# Patient Record
Sex: Male | Born: 1935 | Race: White | Hispanic: No | Marital: Married | Smoking: Never smoker
Health system: Southern US, Community
[De-identification: ages and names within clinical notes are randomized; demographics above are authoritative.]

## PROBLEM LIST (undated history)

## (undated) DIAGNOSIS — M1990 Unspecified osteoarthritis, unspecified site: Secondary | ICD-10-CM

## (undated) DIAGNOSIS — Z9289 Personal history of other medical treatment: Secondary | ICD-10-CM

## (undated) DIAGNOSIS — J449 Chronic obstructive pulmonary disease, unspecified: Secondary | ICD-10-CM

## (undated) DIAGNOSIS — I639 Cerebral infarction, unspecified: Secondary | ICD-10-CM

## (undated) DIAGNOSIS — I779 Disorder of arteries and arterioles, unspecified: Secondary | ICD-10-CM

## (undated) DIAGNOSIS — N200 Calculus of kidney: Secondary | ICD-10-CM

## (undated) DIAGNOSIS — K5790 Diverticulosis of intestine, part unspecified, without perforation or abscess without bleeding: Secondary | ICD-10-CM

## (undated) DIAGNOSIS — E039 Hypothyroidism, unspecified: Secondary | ICD-10-CM

## (undated) DIAGNOSIS — G459 Transient cerebral ischemic attack, unspecified: Secondary | ICD-10-CM

## (undated) DIAGNOSIS — I739 Peripheral vascular disease, unspecified: Secondary | ICD-10-CM

## (undated) DIAGNOSIS — K219 Gastro-esophageal reflux disease without esophagitis: Secondary | ICD-10-CM

## (undated) DIAGNOSIS — N183 Chronic kidney disease, stage 3 unspecified: Secondary | ICD-10-CM

## (undated) DIAGNOSIS — K224 Dyskinesia of esophagus: Secondary | ICD-10-CM

## (undated) DIAGNOSIS — R51 Headache: Secondary | ICD-10-CM

## (undated) DIAGNOSIS — N400 Enlarged prostate without lower urinary tract symptoms: Secondary | ICD-10-CM

## (undated) DIAGNOSIS — Q394 Esophageal web: Secondary | ICD-10-CM

## (undated) DIAGNOSIS — D649 Anemia, unspecified: Secondary | ICD-10-CM

## (undated) DIAGNOSIS — J189 Pneumonia, unspecified organism: Secondary | ICD-10-CM

## (undated) DIAGNOSIS — I428 Other cardiomyopathies: Secondary | ICD-10-CM

## (undated) DIAGNOSIS — I480 Paroxysmal atrial fibrillation: Secondary | ICD-10-CM

## (undated) DIAGNOSIS — I10 Essential (primary) hypertension: Secondary | ICD-10-CM

## (undated) DIAGNOSIS — I82409 Acute embolism and thrombosis of unspecified deep veins of unspecified lower extremity: Secondary | ICD-10-CM

## (undated) DIAGNOSIS — E785 Hyperlipidemia, unspecified: Secondary | ICD-10-CM

## (undated) HISTORY — DX: Disorder of arteries and arterioles, unspecified: I77.9

## (undated) HISTORY — DX: Peripheral vascular disease, unspecified: I73.9

## (undated) HISTORY — DX: Esophageal web: Q39.4

## (undated) HISTORY — DX: Acute embolism and thrombosis of unspecified deep veins of unspecified lower extremity: I82.409

## (undated) HISTORY — DX: Chronic kidney disease, stage 3 unspecified: N18.30

## (undated) HISTORY — PX: OTHER SURGICAL HISTORY: SHX169

## (undated) HISTORY — DX: Hyperlipidemia, unspecified: E78.5

## (undated) HISTORY — DX: Paroxysmal atrial fibrillation: I48.0

## (undated) HISTORY — DX: Chronic obstructive pulmonary disease, unspecified: J44.9

## (undated) HISTORY — PX: JOINT REPLACEMENT: SHX530

## (undated) HISTORY — PX: TOTAL HIP ARTHROPLASTY: SHX124

## (undated) HISTORY — DX: Cerebral infarction, unspecified: I63.9

## (undated) HISTORY — DX: Chronic kidney disease, stage 3 (moderate): N18.3

## (undated) HISTORY — DX: Other cardiomyopathies: I42.8

## (undated) HISTORY — DX: Dyskinesia of esophagus: K22.4

---

## 1994-05-30 HISTORY — PX: TRANSURETHRAL RESECTION OF PROSTATE: SHX73

## 1999-12-15 ENCOUNTER — Ambulatory Visit (HOSPITAL_COMMUNITY): Admission: RE | Admit: 1999-12-15 | Discharge: 1999-12-15 | Payer: Self-pay | Admitting: Internal Medicine

## 1999-12-15 ENCOUNTER — Encounter (INDEPENDENT_AMBULATORY_CARE_PROVIDER_SITE_OTHER): Payer: Self-pay | Admitting: Internal Medicine

## 2000-09-26 ENCOUNTER — Ambulatory Visit (HOSPITAL_COMMUNITY): Admission: RE | Admit: 2000-09-26 | Discharge: 2000-09-26 | Payer: Self-pay | Admitting: Family Medicine

## 2000-09-26 ENCOUNTER — Encounter: Payer: Self-pay | Admitting: Family Medicine

## 2000-11-03 ENCOUNTER — Inpatient Hospital Stay (HOSPITAL_COMMUNITY): Admission: AD | Admit: 2000-11-03 | Discharge: 2000-11-04 | Payer: Self-pay | Admitting: Urology

## 2000-11-03 ENCOUNTER — Encounter: Payer: Self-pay | Admitting: Internal Medicine

## 2000-11-03 ENCOUNTER — Ambulatory Visit (HOSPITAL_COMMUNITY): Admission: RE | Admit: 2000-11-03 | Discharge: 2000-11-03 | Payer: Self-pay | Admitting: Internal Medicine

## 2000-11-04 ENCOUNTER — Encounter: Payer: Self-pay | Admitting: Urology

## 2000-11-08 ENCOUNTER — Encounter: Payer: Self-pay | Admitting: Urology

## 2000-11-08 ENCOUNTER — Ambulatory Visit (HOSPITAL_COMMUNITY): Admission: RE | Admit: 2000-11-08 | Discharge: 2000-11-08 | Payer: Self-pay | Admitting: Urology

## 2000-11-10 ENCOUNTER — Encounter: Payer: Self-pay | Admitting: Urology

## 2000-11-10 ENCOUNTER — Ambulatory Visit (HOSPITAL_COMMUNITY): Admission: RE | Admit: 2000-11-10 | Discharge: 2000-11-10 | Payer: Self-pay | Admitting: Urology

## 2000-12-12 ENCOUNTER — Ambulatory Visit (HOSPITAL_COMMUNITY): Admission: RE | Admit: 2000-12-12 | Discharge: 2000-12-12 | Payer: Self-pay | Admitting: Urology

## 2000-12-12 ENCOUNTER — Encounter: Payer: Self-pay | Admitting: Urology

## 2001-02-09 ENCOUNTER — Ambulatory Visit (HOSPITAL_COMMUNITY): Admission: RE | Admit: 2001-02-09 | Discharge: 2001-02-09 | Payer: Self-pay | Admitting: Urology

## 2001-02-09 ENCOUNTER — Encounter: Payer: Self-pay | Admitting: Urology

## 2001-04-09 ENCOUNTER — Encounter: Payer: Self-pay | Admitting: Emergency Medicine

## 2001-04-09 ENCOUNTER — Emergency Department (HOSPITAL_COMMUNITY): Admission: EM | Admit: 2001-04-09 | Discharge: 2001-04-09 | Payer: Self-pay | Admitting: Emergency Medicine

## 2001-04-12 ENCOUNTER — Encounter: Payer: Self-pay | Admitting: Family Medicine

## 2001-04-12 ENCOUNTER — Ambulatory Visit (HOSPITAL_COMMUNITY): Admission: RE | Admit: 2001-04-12 | Discharge: 2001-04-12 | Payer: Self-pay | Admitting: Family Medicine

## 2001-04-20 ENCOUNTER — Ambulatory Visit (HOSPITAL_COMMUNITY): Admission: RE | Admit: 2001-04-20 | Discharge: 2001-04-20 | Payer: Self-pay | Admitting: Family Medicine

## 2001-04-20 ENCOUNTER — Encounter: Payer: Self-pay | Admitting: Family Medicine

## 2001-05-11 ENCOUNTER — Encounter: Payer: Self-pay | Admitting: Urology

## 2001-05-11 ENCOUNTER — Ambulatory Visit (HOSPITAL_COMMUNITY): Admission: RE | Admit: 2001-05-11 | Discharge: 2001-05-11 | Payer: Self-pay | Admitting: Urology

## 2001-07-26 ENCOUNTER — Encounter: Payer: Self-pay | Admitting: Emergency Medicine

## 2001-07-26 ENCOUNTER — Emergency Department (HOSPITAL_COMMUNITY): Admission: EM | Admit: 2001-07-26 | Discharge: 2001-07-26 | Payer: Self-pay | Admitting: Emergency Medicine

## 2001-08-14 ENCOUNTER — Ambulatory Visit (HOSPITAL_COMMUNITY): Admission: RE | Admit: 2001-08-14 | Discharge: 2001-08-14 | Payer: Self-pay | Admitting: Urology

## 2001-08-14 ENCOUNTER — Encounter: Payer: Self-pay | Admitting: Urology

## 2001-12-27 ENCOUNTER — Ambulatory Visit (HOSPITAL_COMMUNITY): Admission: RE | Admit: 2001-12-27 | Discharge: 2001-12-27 | Payer: Self-pay | Admitting: Orthopaedic Surgery

## 2002-11-01 ENCOUNTER — Ambulatory Visit (HOSPITAL_COMMUNITY): Admission: RE | Admit: 2002-11-01 | Discharge: 2002-11-01 | Payer: Self-pay | Admitting: Family Medicine

## 2002-11-01 ENCOUNTER — Encounter: Payer: Self-pay | Admitting: Family Medicine

## 2002-11-07 ENCOUNTER — Encounter (HOSPITAL_COMMUNITY): Admission: RE | Admit: 2002-11-07 | Discharge: 2002-12-07 | Payer: Self-pay | Admitting: Family Medicine

## 2002-11-07 ENCOUNTER — Encounter: Payer: Self-pay | Admitting: Family Medicine

## 2003-03-07 ENCOUNTER — Ambulatory Visit (HOSPITAL_COMMUNITY): Admission: RE | Admit: 2003-03-07 | Discharge: 2003-03-07 | Payer: Self-pay | Admitting: Family Medicine

## 2003-03-07 ENCOUNTER — Encounter: Payer: Self-pay | Admitting: Family Medicine

## 2003-04-11 ENCOUNTER — Ambulatory Visit (HOSPITAL_COMMUNITY): Admission: RE | Admit: 2003-04-11 | Discharge: 2003-04-11 | Payer: Self-pay | Admitting: Orthopedic Surgery

## 2003-05-06 ENCOUNTER — Encounter (HOSPITAL_COMMUNITY): Admission: RE | Admit: 2003-05-06 | Discharge: 2003-05-30 | Payer: Self-pay | Admitting: Orthopedic Surgery

## 2003-06-03 ENCOUNTER — Encounter (HOSPITAL_COMMUNITY): Admission: RE | Admit: 2003-06-03 | Discharge: 2003-07-03 | Payer: Self-pay | Admitting: Orthopedic Surgery

## 2004-01-01 ENCOUNTER — Encounter: Admission: RE | Admit: 2004-01-01 | Discharge: 2004-01-01 | Payer: Self-pay | Admitting: Orthopedic Surgery

## 2004-03-25 ENCOUNTER — Ambulatory Visit (HOSPITAL_COMMUNITY): Admission: RE | Admit: 2004-03-25 | Discharge: 2004-03-25 | Payer: Self-pay | Admitting: Family Medicine

## 2004-04-15 ENCOUNTER — Ambulatory Visit: Payer: Self-pay | Admitting: Orthopedic Surgery

## 2004-05-30 HISTORY — PX: REPLACEMENT TOTAL KNEE: SUR1224

## 2004-07-08 ENCOUNTER — Ambulatory Visit: Admission: RE | Admit: 2004-07-08 | Discharge: 2004-07-08 | Payer: Self-pay | Admitting: Orthopedic Surgery

## 2004-07-09 ENCOUNTER — Ambulatory Visit: Payer: Self-pay | Admitting: *Deleted

## 2004-07-12 ENCOUNTER — Ambulatory Visit: Payer: Self-pay | Admitting: *Deleted

## 2004-07-12 ENCOUNTER — Ambulatory Visit (HOSPITAL_COMMUNITY): Admission: RE | Admit: 2004-07-12 | Discharge: 2004-07-12 | Payer: Self-pay | Admitting: *Deleted

## 2004-07-15 ENCOUNTER — Ambulatory Visit: Payer: Self-pay | Admitting: *Deleted

## 2004-08-18 ENCOUNTER — Ambulatory Visit: Payer: Self-pay | Admitting: Physical Medicine & Rehabilitation

## 2004-08-18 ENCOUNTER — Inpatient Hospital Stay (HOSPITAL_COMMUNITY): Admission: RE | Admit: 2004-08-18 | Discharge: 2004-08-23 | Payer: Self-pay | Admitting: Orthopedic Surgery

## 2004-09-13 ENCOUNTER — Encounter (HOSPITAL_COMMUNITY): Admission: RE | Admit: 2004-09-13 | Discharge: 2004-10-21 | Payer: Self-pay | Admitting: Orthopedic Surgery

## 2004-11-03 ENCOUNTER — Other Ambulatory Visit: Admission: RE | Admit: 2004-11-03 | Discharge: 2004-11-03 | Payer: Self-pay | Admitting: Dermatology

## 2004-11-15 ENCOUNTER — Emergency Department (HOSPITAL_COMMUNITY): Admission: EM | Admit: 2004-11-15 | Discharge: 2004-11-15 | Payer: Self-pay | Admitting: *Deleted

## 2004-11-16 ENCOUNTER — Inpatient Hospital Stay (HOSPITAL_COMMUNITY): Admission: AD | Admit: 2004-11-16 | Discharge: 2004-11-17 | Payer: Self-pay | Admitting: Urology

## 2004-11-18 ENCOUNTER — Ambulatory Visit (HOSPITAL_COMMUNITY): Admission: RE | Admit: 2004-11-18 | Discharge: 2004-11-18 | Payer: Self-pay | Admitting: Urology

## 2004-11-23 ENCOUNTER — Ambulatory Visit (HOSPITAL_COMMUNITY): Admission: RE | Admit: 2004-11-23 | Discharge: 2004-11-23 | Payer: Self-pay | Admitting: Urology

## 2004-12-03 ENCOUNTER — Ambulatory Visit (HOSPITAL_COMMUNITY): Admission: RE | Admit: 2004-12-03 | Discharge: 2004-12-03 | Payer: Self-pay | Admitting: Urology

## 2004-12-03 ENCOUNTER — Ambulatory Visit (HOSPITAL_BASED_OUTPATIENT_CLINIC_OR_DEPARTMENT_OTHER): Admission: RE | Admit: 2004-12-03 | Discharge: 2004-12-03 | Payer: Self-pay | Admitting: Urology

## 2004-12-14 ENCOUNTER — Ambulatory Visit (HOSPITAL_COMMUNITY): Admission: RE | Admit: 2004-12-14 | Discharge: 2004-12-14 | Payer: Self-pay | Admitting: Urology

## 2004-12-14 ENCOUNTER — Ambulatory Visit (HOSPITAL_BASED_OUTPATIENT_CLINIC_OR_DEPARTMENT_OTHER): Admission: RE | Admit: 2004-12-14 | Discharge: 2004-12-14 | Payer: Self-pay | Admitting: Urology

## 2005-05-20 ENCOUNTER — Ambulatory Visit (HOSPITAL_COMMUNITY): Admission: RE | Admit: 2005-05-20 | Discharge: 2005-05-20 | Payer: Self-pay | Admitting: Family Medicine

## 2005-07-02 ENCOUNTER — Ambulatory Visit (HOSPITAL_COMMUNITY): Admission: RE | Admit: 2005-07-02 | Discharge: 2005-07-02 | Payer: Self-pay | Admitting: Family Medicine

## 2006-02-08 ENCOUNTER — Ambulatory Visit: Payer: Self-pay | Admitting: Gastroenterology

## 2006-02-13 ENCOUNTER — Ambulatory Visit (HOSPITAL_COMMUNITY): Admission: RE | Admit: 2006-02-13 | Discharge: 2006-02-13 | Payer: Self-pay | Admitting: Internal Medicine

## 2006-02-13 ENCOUNTER — Encounter (INDEPENDENT_AMBULATORY_CARE_PROVIDER_SITE_OTHER): Payer: Self-pay | Admitting: *Deleted

## 2006-02-13 ENCOUNTER — Ambulatory Visit: Payer: Self-pay | Admitting: Internal Medicine

## 2006-03-30 ENCOUNTER — Ambulatory Visit: Payer: Self-pay | Admitting: Internal Medicine

## 2006-04-12 ENCOUNTER — Ambulatory Visit (HOSPITAL_COMMUNITY): Admission: RE | Admit: 2006-04-12 | Discharge: 2006-04-12 | Payer: Self-pay | Admitting: Family Medicine

## 2006-04-26 ENCOUNTER — Ambulatory Visit (HOSPITAL_COMMUNITY): Admission: RE | Admit: 2006-04-26 | Discharge: 2006-04-26 | Payer: Self-pay | Admitting: Family Medicine

## 2006-11-27 ENCOUNTER — Ambulatory Visit (HOSPITAL_COMMUNITY): Admission: RE | Admit: 2006-11-27 | Discharge: 2006-11-27 | Payer: Self-pay | Admitting: Family Medicine

## 2008-03-17 ENCOUNTER — Ambulatory Visit (HOSPITAL_COMMUNITY): Admission: RE | Admit: 2008-03-17 | Discharge: 2008-03-17 | Payer: Self-pay | Admitting: Family Medicine

## 2008-05-30 HISTORY — PX: KNEE ARTHROSCOPY: SUR90

## 2008-10-01 ENCOUNTER — Ambulatory Visit (HOSPITAL_COMMUNITY): Admission: RE | Admit: 2008-10-01 | Discharge: 2008-10-01 | Payer: Self-pay | Admitting: Orthopedic Surgery

## 2008-10-13 ENCOUNTER — Encounter: Admission: RE | Admit: 2008-10-13 | Discharge: 2008-10-13 | Payer: Self-pay | Admitting: Orthopedic Surgery

## 2008-10-20 ENCOUNTER — Ambulatory Visit (HOSPITAL_BASED_OUTPATIENT_CLINIC_OR_DEPARTMENT_OTHER): Admission: RE | Admit: 2008-10-20 | Discharge: 2008-10-20 | Payer: Self-pay | Admitting: Orthopedic Surgery

## 2009-04-28 ENCOUNTER — Ambulatory Visit (HOSPITAL_COMMUNITY): Admission: RE | Admit: 2009-04-28 | Discharge: 2009-04-28 | Payer: Self-pay | Admitting: Family Medicine

## 2009-07-31 ENCOUNTER — Ambulatory Visit (HOSPITAL_COMMUNITY): Admission: RE | Admit: 2009-07-31 | Discharge: 2009-07-31 | Payer: Self-pay | Admitting: Sports Medicine

## 2010-09-07 LAB — POCT I-STAT, CHEM 8
Calcium, Ion: 1.12 mmol/L (ref 1.12–1.32)
Glucose, Bld: 87 mg/dL (ref 70–99)
HCT: 37 % — ABNORMAL LOW (ref 39.0–52.0)
Hemoglobin: 12.6 g/dL — ABNORMAL LOW (ref 13.0–17.0)
Potassium: 3.5 mEq/L (ref 3.5–5.1)
TCO2: 26 mmol/L (ref 0–100)

## 2010-10-12 NOTE — Op Note (Signed)
NAME:  Jorge Nixon, Jorge Nixon NO.:  [id number]   MEDICAL RECORD NO.:  [id number]          PATIENT TYPE:  AMB   LOCATION:  DSC                          FACILITY:  [facility]   PHYSICIAN:  Robert A. Thurston Hole, M.D. 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   PROCEDURE:  1. Right knee EUA followed by arthroscopic partial medial and lateral      meniscectomies.  2. Right knee chondroplasty with partial synovectomy.   SURGEON:  Elana Alm. Thurston Hole, MD   ASSISTANT:  Julien Girt, PA   ANESTHESIA:  General.   OPERATIVE TIME:  30 minutes.   COMPLICATIONS:  None.   INDICATIONS FOR PROCEDURE:  [name] is an 77 year old gentleman who has  had significant right knee pain for the past 4-5 months increasing in  nature with exam and MRI documenting meniscal tearing with  chondromalacia and synovitis.  He has failed conservative care and is  now to undergo arthroscopy.   DESCRIPTION:  [name] was brought to the operating room on Oct 20, 2008, after knee block was placed in the holding area by Anesthesia.  He  was placed on the operative table in supine position.  He received Ancef  1 g IV preoperatively for prophylaxis.  His right knee was examined  under anesthesia.  He had full range of motion knee with stable  ligamentous exam with normal patellar tracking.  The right leg was  prepped using sterile DuraPrep and draped using sterile technique.  Originally, through an anterolateral portal the arthroscope with a pump  attached was placed and through an anteromedial portal an arthroscopic  probe was placed.  On initial inspection of the medial compartment, he  had 25% grade 3 and  the rest grade 1 and 2 chondromalacia which was  debrided.  Medial meniscus tear of posterior and medial horn of which  30% was resected back to a stable rim.  Intercondylar notch inspected.  Anteroposterior cruciate ligaments were normal.  Lateral compartment  inspected, 25% grade 3 chondromalacia which was debrided.  Lateral  meniscus tear of posterior and lateral horn of which 30% was resected  back to a stable rim.  Patellofemoral joint showed 30% grade 3  chondromalacia which was debrided, patella tracked normally.  Moderate  synovitis.  The medial and lateral gutters were debrided, otherwise this  was free of pathology.  After this was done, it was felt that all  pathology had been satisfactorily addressed.  The instruments were  removed.  Portals were closed with 3-0 nylon suture.  Sterile dressings  were applied, and the patient awakened and taken to recovery room in  stable condition.   FOLLOWUP CARE:  [name] will be followed as an outpatient on Vicodin  and Mobic.  He will be see back in  my office for sutures out and  followup.      Robert A. Thurston Hole, M.D.  Electronically Signed     RAW/MEDQ  D:  10/20/2008  T:  10/21/2008  Job:  [id number]

## 2010-10-15 NOTE — Op Note (Signed)
NAME:  Jorge Nixon, Jorge Nixon NO.:  [id number]   MEDICAL RECORD NO.:  [id number]          PATIENT TYPE:  INP   LOCATION:  A332                          FACILITY:  [facility]   PHYSICIAN:  Dennie Maizes, M.D.   DATE OF BIRTH:  02/10/1925   DATE OF PROCEDURE:  11/17/2004  DATE OF DISCHARGE:                                 OPERATIVE REPORT   PREOPERATIVE DIAGNOSES:  1.  Right upper ureteral calculi with obstruction.  2.  Right renal colic.  3.  Right hydronephrosis.   POSTOPERATIVE DIAGNOSES:  1.  Right upper ureteral calculi with obstruction.  2.  Right renal colic.  3.  Right hydronephrosis.   OPERATIVE PROCEDURE:  Cystoscopy, retrograde pyelogram and right ureteral  stent placement   ANESTHESIA:  Spinal.   SURGEON:  Dennie Maizes, M.D.   COMPLICATIONS:  None.   DRAINS:  6 French, 26 cm size right ureteral stent.   INDICATIONS FOR PROCEDURE:  This 77 year old male was admitted to the  hospital with severe right flank pain.  X-rays revealed an 11 x 7 mm size  right upper ureteral calculus with obstruction, hydronephrosis and  perinephric stranding.  The patient was taken to the OR today for  cystoscopy, right retrograde pyelogram and right ureteral stent placement.  The obstructing calculus will later be treated with ESL as an outpatient.   DESCRIPTION OF PROCEDURE:  Spinal anesthesia was induced and the patient was  placed on the OR table in the dorsal lithotomy position.  The lower abdomen  and genitalia were prepped and draped in a sterile fashion.  Cystoscopy was  done with a 25-French scope.  A mild bulbous ureteral stricture was noted,  but the scope could be passed through the stricture without any difficulty.  There was evidence of a previous TURP and the prostatic urethra was open.  Appearance of the bladder was normal.  The trigone, ureteral orifices and  bladder mucosa were unremarkable.   A 5-French wedge catheter was then placed in the right ureteral  orifice..  About 7 mL of Renografin 60 was injected to the collecting system.  There  was a large filling defect of about 11 x 7 mm in size in the upper ureter.  There was proximal mild hydronephrosis and hydroureter.   A 5-French open-ended catheter was then placed in the ureter.  A 0.038-inch  Benson guidewire with a flexible tip was then passed through the open-ended  catheter into the renal pelvis without any difficulty.  The open-ended catheter was then removed.  A 6 French, 26 cm size stent was  then inserted into the right collecting system.  The instruments were  removed.  The patient was transferred to the PACU in a satisfactory  condition.       SK/MEDQ  D:  11/17/2004  T:  11/17/2004  Job:  [id number]   cc:   Patrica Duel, M.D.  [address]  [place]  [place] [address]  Fax: [phone]

## 2010-10-15 NOTE — Op Note (Signed)
NAME:  NIV, DARLEY NO.:  [id number]   MEDICAL RECORD NO.:  [id number]          PATIENT TYPE:  INP   LOCATION:  5005                         FACILITY:  [facility]   PHYSICIAN:  Elana Alm. Thurston Hole, M.D. DATE OF BIRTH:  1924/05/31   DATE OF PROCEDURE:  08/18/2004  DATE OF DISCHARGE:                                 OPERATIVE REPORT   PREOPERATIVE DIAGNOSIS:  Left knee DJD.   POSTOPERATIVE DIAGNOSIS:  Left knee DJD.   PROCEDURE:  1.  Left total knee replacement using DePuy total knee system with #5      cemented femur, #4 cemented tibia with 10 mm polyethylene RP tibial      spacer and 38 mm polyethylene cemented patella.  2.  Computer assisted navigation.   SURGEON:  Elana Alm. Thurston Hole, M.D.   ASSISTANT:  Julien Girt, P.A.   ANESTHESIA:  General.   OPERATIVE TIME:  1 hour 40 minutes.   COMPLICATIONS:  None.   DESCRIPTION OF PROCEDURE:  Mr. Rosalia Hammers was brought to operating room on August 18, 2004 after a femoral nerve block had been placed in the holding room by  anesthesia for postoperative pain control. He was placed on the operative  table in supine position.  After being placed under general anesthesia, he  had a Foley catheter placed under sterile conditions and received Ancef 1  gram IV preoperatively for prophylaxis. His left knee was examined. He had  range of motion from minus 10 to 130 degrees.  Knee was stable ligamentous  exam with normal patellar tracking with mild varus deformity. Left leg was  then prepped using sterile DuraPrep and draped using sterile technique. Leg  was exsanguinated and a thigh tourniquet elevated to 350 mm. Initially  through a 15 cm longitudinal incision based over the patella, initial  exposure was made.  The underlying subcutaneous tissues were incised in line  with the skin incision. A median arthrotomy was performed revealing an  excessive amount of normal-appearing joint fluid. The articular surfaces  were inspected.   He had grade 4 changes medially, grade 3 and 4 changes  laterally and grade 3 and 4 changes in the patellofemoral joint. Osteophytes  were removed off the femoral condyles and tibial plateau. The medial and  lateral meniscal remnants were removed as well as the anterior cruciate  ligament. At this point then two separate pins for the computer assisted  navigation were placed first in the proximal tibia through two small  incisions and then into the distal femur through the surgical incision. Each  of these then had the computer assisted navigation devices placed on them  and the navigation system was activated. Initial deformity showed  approximately 3.5 mm of varus with 10 degrees flexion contracture. At this  point then using the computer assisted navigation, the distal femoral cut  was made with the jig being placed for perfect alignment and a distal 12 mm  cut was made. After this was done the distal femur was sized.  A #5 was  found to be the appropriate size. A #5  cutting jig was placed and then these  cuts were made. These cuts were all checked with the computer assisted  navigation and found to be perfect. At this point, then the proximal tibia  was exposed. The proximal tibial jig was placed again with computer  navigation assistance for removing 8 mm of tibial cut off the medial or  higher side. This cut was then made again verified with the navigation  system and found to be perfect as well. At this point then the box cutter  for the PCL substituting prosthesis was placed and these cuts were made. At  this point then the #5 femoral trial was placed. The #4 tibial base plate  trial was placed and with a 10 mm polyethylene rotating platform spacer  there was found to be excellent correction of his varus deformity to neutral  with correction of his flexion deformity to within 2-3 degrees with  excellent stability. The patella was then sized. A 38 mm diameter was found  to be the  appropriate size and a resurfacing 11 mm cut was made and three  locking holes were placed. After this was done it was felt that all the  trial components were of excellent size and stability. They were then  removed and the knee was jet lavage irrigated with 3 liters of saline  solution. The tibial #4 base plate was then hammered into position with an  excellent fit with excess cement being removed from around the edges. The #5  femoral component with cement backing was hammered into position also with  an excellent fit with excess cement being removed from around the edges. The  10 mm polyethylene RP tibial spacer was then placed on the tibial base  plate. The knee taken through range of motion 0 to  120 degrees with  excellent stability and excellent correction of his varus deformity and  flexion deformity. The 38 mm polyethylene cement backed patella was then  placed into its predetermined holes and held there with a clamp. After the  cement hardened. Patellofemoral tracking was evaluated and this was found to  be normal. At this point felt that all components were excellent size, fit  and stability. The knee was further irrigated with saline. The pins were  removed from the computer navigation system and then the arthrotomy was  closed with #1 Ethibond suture over two medium Hemovac drains. Subcutaneous  tissues closed with 0 and 2-0 Vicryl, skin closed with skin staples. Sterile  dressings were applied. Hemovac injected with 0.25% Marcaine with  epinephrine and 4 milligrams morphine and clamped. Tourniquet was released  and the patient awakened and taken to recovery in stable condition.  Needle  and sponge counts correct x2 at the end of  the case.       RAW/MEDQ  D:  08/18/2004  T:  08/19/2004  Job:  [id number]

## 2010-10-15 NOTE — Consult Note (Signed)
NAME:  Jorge Nixon, Jorge Nixon NO.:  [id number]   MEDICAL RECORD NO.:  [id number]          PATIENT TYPE:  AMB   LOCATION:  DAY                           FACILITY:  [facility]   PHYSICIAN:  Lionel December, M.D.    DATE OF BIRTH:  May 17, 1925   DATE OF CONSULTATION:  DATE OF DISCHARGE:                                   CONSULTATION   REQUESTING PHYSICIAN:  Patrica Duel, M.D.   REASON FOR CONSULTATION:  Significant weight loss, abdominal pain.   HISTORY OF PRESENT ILLNESS:  [name] is a 77 year old Caucasian male who  has not been seen by Dr. Karilyn Cota in at least 6 years. He has a history of  reflux esophagitis. He had extensive GI workup however, this has been many  years ago. He was found to have a positive antiagglutinin antibody IgG and  otherwise normal celiac panel. More recently he tells me he has developed  intermittent right upper quadrant pain. He describes the pain as a soreness,  he rates the pain at 7/10 on a pain scale, it usually lasts minutes. It is  usually better with defecation. He has a history of chronic constipation. He  can go up to 3 days without a bowel movement. He has been using over-the-  counter laxatives on a regular basis, a couple of times per month. He denies  any rectal bleeding or melena. He has had some nausea along with heartburn  and indigestion. He has symptoms most every day of the week. He denies any  vomiting, he has noted some anorexia but says he has never had a big  appetite. He denies any dysphagia or odynophagia.   He has had a greater than 10 pound weight loss in the last year.   PAST MEDICAL HISTORY:  GERD, reflex esophagitis on last EGD. Colonoscopy Oct 05, 1999 which showed left-sided colonic diverticulosis. A small hyperplastic  polyp was ablated from the rectum. History of renal lithiasis. He is status  post H. pylori treatment. He has had a left knee replacement, right arm  surgery and TURP by Dr. Jerre Simon.   CURRENT  MEDICATIONS:  1. Hyzaar 1/2 tablet daily.  2. Geritol once daily.  3. Aspirin 81 mg daily.  4. Over-the-counter antacid p.r.n.  5. Metamucil p.r.n.  6. Generic over-the-counter laxative.   ALLERGIES:  SULFA and LEVBID.   FAMILY HISTORY:  There is no known family history of colorectal carcinoma,  liver or chronic GI problems. Mother deceased unknown etiology age 42.  Father age 22 deceased secondary to coronary artery disease and MI. He has 2  children who have committed suicide, 2 healthy sisters.   SOCIAL HISTORY:  [name] is in his second marriage for the last 8 years. He  has 1 healthy son. He is retired from [employer]. He denies any tobacco, alcohol  or drug use.   REVIEW OF SYSTEMS:  CONSTITUTIONAL:  Denies any fatigue. CARDIOVASCULAR:  Denies any chest pain or palpitations. PULMONARY:  Denies any shortness of  breath, dyspnea, cough, hemoptysis. GI:  See HPI.   PHYSICAL  EXAMINATION:  VITAL SIGNS:  Weight 161.5 pounds, height 70-1/2  inches, temperature 97.8, blood pressure 136/80, pulse 64.  GENERAL:  [name] is a 77 year old, elderly, Caucasian male who is alert  and oriented, pleasant and cooperative in no acute distress.  HEENT:  Sclera  non-icteric. Conjunctivae pink.  Oropharynx pink and moist  without any lesions.  NECK:  Supple without any masses or thyromegaly.  CHEST:  Regular rate and rhythm. Normal S1, S2 without murmurs, thrills,  rubs or gallops.  LUNGS:  Clear to auscultation bilaterally.  ABDOMEN:  Positive bowel sounds x4. No bruits auscultated.  Soft, nontender,  nondistended without palpable mass or hepatosplenomegaly. No rebound  tenderness or guarding.  EXTREMITIES:  Without clubbing or edema bilaterally.   IMPRESSION:  [name] is a 77 year old Caucasian male with a greater than 10  pound weight loss in the last year. He complains of daily heartburn,  indigestion as well as some anorexia. He is going to require an EGD for  further evaluation. He has  also noticed a change in his bowel habits. He has  been more constipated. He is having some intermittent right upper quadrant  abdominal pain that is usually resolved post defecation. Given these  findings, he is going to require further evaluation with colonoscopy at this  time.   PLAN:  1. EGD and colonoscopy with Rehman in the near future. I have discussed      both procedures including  the risks and benefits including but not      limited to bleeding, infection, perforation, drug reaction. He agrees      with plan and consent will be obtained  2. Would consider the addition of MiraLax although he tells me Metamucil      is helping him quite a bit lately as far as his constipation is      concerned.  3. Given his history of gastroesophageal reflux disease, he may benefit      from proton pump inhibitor but will await EGD findings first.      [name], N.P.      Lionel December, M.D.  Electronically Signed    KC/MEDQ  D:  02/08/2006  T:  02/09/2006  Job:  [id number]

## 2010-10-15 NOTE — Op Note (Signed)
NAME:  Jorge Nixon, Jorge Nixon NO.:  [id number]   MEDICAL RECORD NO.:  [id number]          PATIENT TYPE:  AMB   LOCATION:  NESC                         FACILITY:  [facility]   PHYSICIAN:  Mark C. Vernie Ammons, M.D.  DATE OF BIRTH:  [birth year]   DATE OF PROCEDURE:  12/14/2004  DATE OF DISCHARGE:                                 OPERATIVE REPORT   PREOPERATIVE DIAGNOSIS:  Migrated right ureteral stent.   POSTOPERATIVE DIAGNOSIS:  Migrated right ureteral stent.   PROCEDURE:  Cystoscopy, right ureteroscopy and extraction of migrated  ureteral stent.   SURGEON:  Dr. Vernie Ammons   ANESTHESIA:  General.   BLOOD LOSS:  None.   DRAINS:  None.   SPECIMENS:  None.   COMPLICATIONS:  None.   INDICATIONS:  The patient is a 77 year old white male, who I performed  ureteroscopy and stone extraction on December 03, 2004.  The procedure went well.  He was bothered by his 6-French stent, so I replaced that with a 4.7 [place]  stent but when he came to the office, he was complaining of some stent  discomfort, and I found the stent had migrated up the ureter and could not  be seen in the bladder cystoscopically.  He is brought to the OR today for  ureteroscopic extraction.   DESCRIPTION OF OPERATION:  After informed consent, the patient brought to  the major OR, placed on the table, administered general anesthesia, then  moved to the dorsal lithotomy position.  His genitalia was sterilely prepped  and draped and initially, a 21-French cystoscope was introduced in the  bladder.  I noted the ureteral orifice had some mild edema.  I was able to  then pass an alligator forceps up the ureter and under fluoroscopy,  attempted to grasp the stent, was unsuccessful.  I therefore removed the  cystoscope and the alligator forceps.   I then inserted the 6-French short rigid ureteroscope and was able to pass  this easily into the right ureter.  I visualized the stent and using a  nitinol basket, I was able to  grasp the stent and extract it without  difficulty.  The patient was awakened and taken to the recovery room in  stable satisfactory condition.  He tolerated the procedure well with no  intraoperative complications.  He was given Pyridium in the recovery room.       MCO/MEDQ  D:  12/14/2004  T:  12/14/2004  Job:  [id number]

## 2010-10-15 NOTE — Discharge Summary (Signed)
NAME:  Jorge Nixon, Jorge Nixon NO.:  [id number]   MEDICAL RECORD NO.:  [id number]          PATIENT TYPE:  INP   LOCATION:  5005                         FACILITY:  [facility]   PHYSICIAN:  Elana Alm. Thurston Hole, M.D. DATE OF BIRTH:  08-10-1924   DATE OF ADMISSION:  08/18/2004  DATE OF DISCHARGE:  08/23/2004                                 DISCHARGE SUMMARY   ADMITTING DIAGNOSES:  1.  End-stage degenerative joint disease left knee.  2.  Hypertension.  3.  Coronary artery disease, with an ejection fraction of 50%.  4.  Gastroesophageal reflux.   DISCHARGE DIAGNOSES:  1.  End-stage degenerative joint disease, left total knee replacement.  2.  Hypertension.  3.  Coronary artery disease.  4.  Reflux.  5.  Prostate disease.   HISTORY OF PRESENT ILLNESS:  The patient is a 77 year old white male who has  had end-stage DJD of his left knee.  He has tried conservative care,  including antiinflammatories and interarticular cortisone injections, all  without success.  He understands the risks, benefits, and possible  complications of a left total knee replacement and is without question.   PROCEDURES IN-HOUSE:  On August 18, 2004, the patient underwent a left total  knee replacement by Dr. Thurston Hole.  Postoperatively, he had a left femoral  nerve block by anesthesia.  He tolerated both procedures well.  He was  admitted for pain control, DVT prophylaxis, and physical therapy.   On postop day one, the patient had difficulty with sore throat but no other  complaints.  T-max of 100.9.  Blood pressure was 83/52.  He had no excess  drainage.  He was in a CPM 0-90.  He was given a 500 cc bolus of normal  saline, and his PCA was discontinued.  His blood pressure medicine of Hyzaar  was held.  Dressing change was ordered.  On postop day one, he was minimal  assist ________ his bed, ambulated 15 feet.  Postop day two, had some  difficulty with pain control.  Hemoglobin was 9.5.  INR was 1.7.  He  was  metabolically stable.  T-max of 100.6; blood pressure 171/90.  His Foley was  discontinued.  He was given 2.5 mg of metoprolol IV and then started on  Toprol XL 25 mg p.o. daily.  On postop day three, the patient was doing  well.  T-max of 100.9; blood pressure 111/60.  Hemoglobin 8.8.  INR 2.5.  On  postop day four, the patient had some dizziness with some hypotension.  Blood pressure 92/54.  No shortness of breath.  Having some left calf pain,  with a positive Homans'.  Venous Doppler ordered.  DVT was ruled out.  He  was restarted on Hyzaar 50 mg/12.5, 1/2 tablet p.o. daily.  On postop day  five, patient much better.  Surgical wound well approximated and healing.  Minimal drainage.  Vital signs are stable.  His INR is 2.5.  He was  discharged to home in stable condition, weightbearing as tolerated, on a  regular diet.   DISCHARGE MEDICATIONS:  1.  Percocet 1-2 q.4-6 h. p.r.n. pain.  2.  Robaxin 1 tablet q.4-6 h. p.r.n. muscle spasm.  3.  Coumadin 2 mg 1 tablet daily.  4.  Aspirin 81 mg 1 tablet daily.  5.  Hyzaar 50/12.5, 1/2 tablet daily, do not take if blood pressure less      than 120/80.  6.  Colace 100 mg 1 tablet b.i.d.  7.  Senokot-S 2 tablets before dinner.   He has been instructed to use his CPM 0-90 degrees eight hours a day, and  place his left heel on a folded pillow every morning for 30 minutes.  He is  to call with increased redness, increased pain, increased drainage, or a  temperature greater than 101.   FOLLOWUP:  With Dr. Thurston Hole on August 31, 2004.       KS/MEDQ  D:  10/06/2004  T:  10/06/2004  Job:  [id number]

## 2010-10-15 NOTE — Op Note (Signed)
NAME:  Jorge Nixon, Jorge Nixon NO.:  [id number]   MEDICAL RECORD NO.:  [id number]          PATIENT TYPE:  AMB   LOCATION:  NESC                         FACILITY:  [facility]   PHYSICIAN:  Mark C. Vernie Ammons, M.D.  DATE OF BIRTH:  [birth year]   DATE OF PROCEDURE:  12/03/2004  DATE OF DISCHARGE:                                 OPERATIVE REPORT   PREOPERATIVE DIAGNOSIS:  Right renal calculus.   POSTOPERATIVE DIAGNOSIS:  Right renal calculus.   PROCEDURE:  Cystoscopy, right ureteroscopy, laser in situ lithotripsy and  stone extraction with double-J stent placement.   SURGEON:  Dr. Vernie Ammons   ANESTHESIA:  General.   DRAINS:  4.7 French 26 cm double-J stent in the right ureter with no string.   SPECIMEN:  Stone to patient.   BLOOD LOSS:  Minimal.   COMPLICATIONS:  None.   INDICATIONS:  The patient is a 77 year old white male, who developed right  flank pain on June19, went to the emergency room, was found to have  hydronephrosis and a right upper ureteral stone.  Dr. Rito Ehrlich placed a  stent and kept him in the hospital for 2 days, and then he underwent  lithotripsy.  Follow-up KUB revealed no fragmentation of the stone and the  patient was seen by me with the stent in place.  We discussed the treatment  options, and I feel that repeat lithotripsy will not be successful and  recommended ureteroscopy.  The risks, complications, and alternatives were  discussed.  The patient understands and has elected to proceed.   DESCRIPTION OF OPERATION:  After informed consent, the patient brought to  the major OR, placed on the table, administered general anesthesia, then  moved to the dorsolithotomy position.  His genitalia was sterilely prepped  and draped and initially a 21-French cystoscope with 12 degree lens was  inserted.  The urethra was noted be normal down to the sphincter which is  intact.  The prostatic urethra revealed no lesions and was not particularly  obstructing.  The  bladder was noted to be free of any tumors or stones.  There was some edema around the right orifice with the stent exiting that.  I grasped it with alligator forceps and brought it out to the meatus.   A 0.038 inch floppy tip guidewire was then passed through the stent and into  the right renal pelvis under direct fluoroscopic control.  I then removed  the stent and passed the 6-French flexible ureteroscope over the guidewire  into the renal pelvis, removing the guidewire and easily visualizing the  stone in the upper pole.  I grasped the stone with a nitinol basket and  pulled it down to as far as it would go easily.  It held up just at the UPJ  region.  I then left the stone within the nitinol basket and removed the  flexible ureteroscope.  The 6-French long rigid ureteroscope was then passed  up the ureter next to the nitinol basket, and the 350 micron laser fiber was  passed through the rigid scope, and this stone  was treated and fragmented  with a great deal of difficulty.  The difficulty arose from the fact that  the stone was extremely hard; however, no ureteral injury occurred and after  complete fragmentation of the stone, I then used the rigid ureteroscope and  the nitinol basket to grasp portions of the broken stone and extract them  individually.  I then passed the guidewire after all the stone fragments had  been extracted.  I passed the guidewire into the renal pelvis, back loaded  the cystoscope, and passed the double-J stent over the guidewire with good  curl being noted in the renal pelvis and the bladder.  The bladder was  drained.  The patient was awakened and taken to recovery room in stable  satisfactory condition.  He tolerated the procedure well.  No intraoperative  complications.  He will be given a prescription for Vicodin HP 1-2 q.4h.  p.r.n. pain #38 and Pyridium 200 mg q.6h. p.r.n. dysuria #36.  He will  follow up in my office in 1 week for cystoscopic stent  removal.       MCO/MEDQ  D:  12/03/2004  T:  12/03/2004  Job:  [id number]

## 2010-10-15 NOTE — Op Note (Signed)
NAME:  Jorge Nixon, Jorge Nixon NO.:  [id number]   MEDICAL RECORD NO.:  [id number]          PATIENT TYPE:  AMB   LOCATION:  DAY                           FACILITY:  [facility]   PHYSICIAN:  Lionel December, M.D.    DATE OF BIRTH:  29-Oct-1924   DATE OF PROCEDURE:  02/13/2006  DATE OF DISCHARGE:                                 OPERATIVE REPORT   PROCEDURE:  Esophagogastroduodenoscopy followed by colonoscopy.   INDICATIONS:  Jorge Nixon is a 77 year old Caucasian male with new onset of  daily heartburn, anorexia and 10-pound weight loss as well as change in his  bowel habits.  He has been having more problems with constipation and  intermittent upper abdominal pain.  He has been treated for H. pylori  gastritis a few years ago.  He recently had a CT by Dr. Nobie Putnam that showed  diverticulosis but no other visceral abnormalities to account for his  symptomatology.  He is undergoing a diagnostic evaluation.  The procedure is  reviewed with the patient and informed consent was obtained.   MEDS FOR CONSCIOUS SEDATION:  Benzocaine spray for pharyngeal topical  anesthesia, Demerol 25 mg IV, Versed 3 mg IV.   FINDINGS:  The procedure performed in endoscopy suite.  The patient's vital  signs and O2 sat were monitored during the procedure and remained stable.   DESCRIPTION OF PROCEDURE:  1. Esophagogastroduodenoscopy.  The patient was placed in the left lateral      decubitus position and Olympus videoscope was passed through oropharynx      without any difficulty into esophagus.   ESOPHAGUS:  The mucosa of the esophagus normal. Linear erosions were noted  in the distal 2 cm of the esophagus.  The GE junction was at 42 cm and  hiatus was a 44.  He had a small sliding hiatal hernia.   STOMACH:  It was empty and distended very well with insufflation.  Folds of  the proximal stomach were normal.  Examination of mucosa at body, antrum,  pyloric channel as well as angularis, fundus and cardia  was normal.   DUODENUM:  The bulbar mucosa was normal.  The scope was passed in the second  part of duodenum where mucosa and folds were normal except there was a  single polyp on a fold along the medial side which did not appear to be at  the ampulla.  This was biopsied for histology.  The polypectomy was not  performed as approach to this was not good with Q-scope.  The endoscope was  withdrawn, the patient prepared for procedure #2.   1. Colonoscopy.  Rectal examination performed.  No abnormality noted on      external or digital exam.  The Olympus videoscope was placed in the      rectum and advanced under vision into the sigmoid colon which was      tortuous and noncompliant.  The patient was turned in supine position      and using abdominal pressure the scope was passed further into the      descending  colon and beyond.  Preparation was satisfactory.  He had      scattered diverticula predominantly at sigmoid colon but a few more      proximal to the splenic flexure.  The scope was passed into cecum which      was identified by appendiceal orifice and ileocecal valve.  Pictures      taken for the record.  As the scope was withdrawn, the colonic mucosa      was once again carefully examined.  A single small polyp at transverse      colon which ablated via cold biopsy.  The mucosa in the rest of the      colon was normal.  The rectal mucosa similarly was normal.  The scope      was retroflexed to examine the anorectal junction and small      hemorrhoids.  The mucosa in the rest of the colon was normal.  Rectal      mucosa similarly was normal.  The scope was retroflexed to examine the      anorectal junction which was unremarkable.  The endoscope was      straightened and withdrawn.  The patient tolerated the procedures well.   FINAL DIAGNOSIS:  1. Erosive reflux esophagitis with a small sliding hiatal hernia.  2. Small polyp involving the second part of the duodenum which did  not      appear to be at the ampulla.  3. It was biopsied for histology.  If this is an adenoma, he would need to      return for a polypectomy with the duodenoscope.  4. Pan colonic diverticulosis.  He has a moderate number of diverticula at      the sigmoid colon and a few more proximal in the proximal colon.  5. A 2 x 5 mm polyp ablated via cold biopsy from transfer transverse      colon.   RECOMMENDATIONS:  1. Antireflux measures.  2. High-fiber diet.  3. He will continue MiraLax which was started recently plus fiber      supplement 3-4 grams daily.  4. Omeprazole 20 mg daily.  5. MiraLax 17 grams daily.  6. Will check a CBC, sed rate, TSH and comprehensive chemistry panel      today.  I will be contacting the patient with the results of biopsy and      blood test and further recommendations.      Lionel December, M.D.  Electronically Signed     NR/MEDQ  D:  02/13/2006  T:  02/14/2006  Job:  [id number]   cc:   Patrica Duel, M.D.  Fax: [phone]

## 2010-10-15 NOTE — Op Note (Signed)
NAME:  Jorge Nixon, Jorge Nixon                             ACCOUNT NO.:  [id number]   MEDICAL RECORD NO.:  [id number]                   PATIENT TYPE:  AMB   LOCATION:  DAY                                  FACILITY:  [facility]   PHYSICIAN:  Earnstine Regal, M.D.               DATE OF BIRTH:  1925-04-02   DATE OF PROCEDURE:  DATE OF DISCHARGE:  12/27/2001                                 OPERATIVE REPORT   PREOPERATIVE DIAGNOSIS:  Lesion, right little finger volar side.   POSTOPERATIVE DIAGNOSIS:  Lesion, right little finger volar side, pending  the pathology.   PROCEDURE:  Excision lesion, right little finger volar side.   SURGEON:  Earnstine Regal, M.D.   ASSISTANT:  Guinevere Ferrari, P.A.C.   INDICATIONS FOR PROCEDURE:  The patient is a 77 year old male with a lesion  and pain and tenderness in his right little finger.  It has not gotten any  better.  It is painful.  It is irritating his finger in moving.  Neurovascular is intact.  He says he would like to have it excised as it is  getting slightly bigger and it gets in his way, and when he tried to hold  something and squeeze something it hurts.   DESCRIPTION OF PROCEDURE:  The patient was placed supine on the operating  room table.  Bier block anesthesia was given.  The patient was prepped and  draped in the usual manner.  The patient's identity was ascertained to be  correct.  The procedure was the excision of lesion right little finger; this  was ascertained to be correct.  At this point incision was then made over  the lesion and it was exposed.  It appeared to be a very thick, hard band of  material; it was not a cyst.  It was exposed, neurovascular bundles exposed  and protected, and the lesion was then excised.  The tendon was examined; no  injury to the tendon sheath.  Neurovascular bundle was examined and the  vascular structures appeared to be intact.  The wound edges were  reapproximated using 3-0 nylon in a vertical mattress manner.   Sterile  dressing applied and tourniquet deflated in the usual fashion after Bier  block anesthesia.  The patient tolerated the procedure well.   DISPOSITION:  Percocet was given for pain.  Will see in the office in  approximately 10 days to two weeks.  If any difficulty at all he is to  contact me through the office or hospital beeper system.                                                Earnstine Regal, M.D.    JWK/MEDQ  D:  12/27/2001  T:  01/01/2002  Job:  [id number]

## 2010-10-15 NOTE — H&P (Signed)
NAME:  Jorge Nixon, Jorge Nixon NO.:  [id number]   MEDICAL RECORD NO.:  [id number]          PATIENT TYPE:  INP   LOCATION:  A332                          FACILITY:  [facility]   PHYSICIAN:  Dennie Maizes, M.D.   DATE OF BIRTH:  10/25/1924   DATE OF ADMISSION:  11/16/2004  DATE OF DISCHARGE:  LH                                HISTORY & PHYSICAL   CHIEF COMPLAINT:  Severe right flank pain radiating to the front, nausea and  vomiting.   HISTORY OF PRESENT ILLNESS:  This 77 year old male has history of recurrent  urolithiasis.  Experienced onset of severe right flank pain radiating to the  front about two days ago.  This is associated with severe nausea and  vomiting.  The pain is very severe, 10/10 on a pain scale.  The patient did  not have any fever, chills, voiding difficulties, or gross hematuria.  He  came to the emergency room at [facility] on November 15, 2004.  Evaluation done was a non-contrast CT scan of the abdomen and pelvis.  This  revealed a large 12 mm size right upper ureteral calculus with obstruction,  hydronephrosis, and perinephric stranding.  The patient had pain relief in  the ER, and he was sent to the office.  He was seen in the office, and he  was scheduled to undergo cystoscopy, right retrograde pyelogram, and right  ureteral stent placement on November 17, 2004.  The patient's pain was not  adequately controlled with p.r.n. analgesics.  He was admitted to the  hospital on November 16, 2004, for pain control, IV fluids, and further  treatment.   PAST MEDICAL HISTORY:  1.  Recurrent urolithiasis status post ESL of right upper ureteral calculus      in 2002.  2.  Status post TURP in 1997.  3.  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  4.  History of hypertension.  5.  GERD.   MEDICATIONS:  1.  Hyzaar 50 /12.5 1/2 p.o. daily.  2.  Aspirin 81 mg p.o. daily which has been discontinued.  3.  Multivitamins.   ALLERGIES:  SULFA.   PHYSICAL  EXAMINATION:  GENERAL:  The patient was in severe pain.  HEAD, EYES, EARS, NOSE, AND THROAT:  Normal.  NECK:  No masses.  LUNGS:  Clear to auscultation.  HEART:  Regular rate and rhythm, no murmurs.  ABDOMEN:  Soft.  Palpable flank mass.  Moderate costovertebral angle  tenderness was noted.  GU:  Penis normal.  Testes normal.  RECTAL:  Examination not done.  Rectal examination was done by the ER  physician and was not repeated.   ADMISSION LABORATORY DATA:  Urinalysis blood moderate, nitrite negative,  leukocyte esterase negative, wbc's 3 to 6 per high power field, rbc's 7 to  10 per high power field, bacteria few.   X-ray of the KUB area revealed a 11 x 7 mm size right ureteral calculus.   IMPRESSION:  Right upper ureteral calculus with obstruction, right renal  colic, right hydronephrosis.   PLAN:  1.  Admit the patient to the hospital.  2.  IV fluids.  3.  Parenteral narcotics.  4.  The patient was started on Cipro.  5.  I discussed with the patient and his wife regarding the management      options.  He is scheduled to undergo cystoscopy, right retrograde      pyelogram, and right ureteral stent placement under anesthesia on November 17, 2004.  I have discussed with the patient regarding the diagnosis,      operative details, alternative treatments, outcome, possible risks and      complications, and he has agreed for the procedure to be done.  6.  The right upper ureteral calculus will later be treated with ESL as an      outpatient.       SK/MEDQ  D:  11/16/2004  T:  11/16/2004  Job:  [id number]   cc:   Patrica Duel, M.D.  [address]  [place]  [place] [address]  Fax: [phone]

## 2010-11-18 ENCOUNTER — Other Ambulatory Visit (HOSPITAL_COMMUNITY): Payer: Self-pay | Admitting: Orthopedic Surgery

## 2010-11-18 DIAGNOSIS — M25561 Pain in right knee: Secondary | ICD-10-CM

## 2010-11-23 ENCOUNTER — Ambulatory Visit (HOSPITAL_COMMUNITY)
Admission: RE | Admit: 2010-11-23 | Discharge: 2010-11-23 | Disposition: A | Discharge status: Home / Self Care | Payer: Medicare Other | Source: Ambulatory Visit | Attending: Orthopedic Surgery | Admitting: Orthopedic Surgery

## 2010-11-23 DIAGNOSIS — M2390 Unspecified internal derangement of unspecified knee: Secondary | ICD-10-CM | POA: Insufficient documentation

## 2010-11-23 DIAGNOSIS — M25469 Effusion, unspecified knee: Secondary | ICD-10-CM | POA: Insufficient documentation

## 2010-11-23 DIAGNOSIS — M25569 Pain in unspecified knee: Secondary | ICD-10-CM | POA: Insufficient documentation

## 2010-11-23 DIAGNOSIS — M25561 Pain in right knee: Secondary | ICD-10-CM

## 2010-12-15 ENCOUNTER — Other Ambulatory Visit (HOSPITAL_COMMUNITY): Payer: Self-pay | Admitting: Internal Medicine

## 2010-12-15 DIAGNOSIS — Z01818 Encounter for other preprocedural examination: Secondary | ICD-10-CM

## 2010-12-22 ENCOUNTER — Encounter (HOSPITAL_COMMUNITY): Payer: Self-pay

## 2010-12-22 ENCOUNTER — Encounter (HOSPITAL_COMMUNITY)
Admission: RE | Admit: 2010-12-22 | Discharge: 2010-12-22 | Disposition: A | Discharge status: Home / Self Care | Payer: Medicare Other | Source: Ambulatory Visit | Attending: Internal Medicine | Admitting: Internal Medicine

## 2010-12-22 ENCOUNTER — Ambulatory Visit (HOSPITAL_COMMUNITY)
Admission: RE | Admit: 2010-12-22 | Discharge: 2010-12-22 | Disposition: A | Discharge status: Home / Self Care | Payer: Medicare Other | Source: Ambulatory Visit | Attending: Internal Medicine | Admitting: Internal Medicine

## 2010-12-22 DIAGNOSIS — I10 Essential (primary) hypertension: Secondary | ICD-10-CM

## 2010-12-22 DIAGNOSIS — Z01810 Encounter for preprocedural cardiovascular examination: Secondary | ICD-10-CM | POA: Insufficient documentation

## 2010-12-22 DIAGNOSIS — R079 Chest pain, unspecified: Secondary | ICD-10-CM | POA: Insufficient documentation

## 2010-12-22 DIAGNOSIS — Z01818 Encounter for other preprocedural examination: Secondary | ICD-10-CM

## 2010-12-22 DIAGNOSIS — I2510 Atherosclerotic heart disease of native coronary artery without angina pectoris: Secondary | ICD-10-CM | POA: Insufficient documentation

## 2010-12-22 DIAGNOSIS — J4489 Other specified chronic obstructive pulmonary disease: Secondary | ICD-10-CM | POA: Insufficient documentation

## 2010-12-22 DIAGNOSIS — J449 Chronic obstructive pulmonary disease, unspecified: Secondary | ICD-10-CM | POA: Insufficient documentation

## 2010-12-22 HISTORY — DX: Essential (primary) hypertension: I10

## 2010-12-22 MED ORDER — SODIUM CHLORIDE 0.9 % IJ SOLN
INTRAMUSCULAR | Status: AC
  Filled 2010-12-22: qty 10

## 2010-12-22 MED ORDER — TECHNETIUM TC 99M TETROFOSMIN IV KIT
10.0000 | PACK | Freq: Once | INTRAVENOUS | Status: AC | PRN
  Administered 2010-12-22: 9 via INTRAVENOUS

## 2010-12-22 MED ORDER — TECHNETIUM TC 99M TETROFOSMIN IV KIT
30.0000 | PACK | Freq: Once | INTRAVENOUS | Status: AC | PRN
  Administered 2010-12-22: 30.5 via INTRAVENOUS

## 2010-12-22 MED ORDER — AMINOPHYLLINE 25 MG/ML IV SOLN
INTRAVENOUS | Status: AC
  Filled 2010-12-22: qty 20

## 2010-12-22 NOTE — Progress Notes (Signed)
Stress Lab Nurses Notes - [facility] Hawking  DANY WALTHER 12/22/2010  Reason for doing test: HTN and Surgical Clearance  Type of test: Steffanie Dunn  Nurse performing test: Parke Poisson, RN  Nuclear Medicine Tech: Lyndel Pleasure  Echo Tech: Not Applicable  MD performing test: Dr. Rennis Golden  Family MD: Dwana Melena  Test explained and consent signed: yes  IV started: 22g jelco, Saline lock flushed, No redness or edema and Saline lock started in radiology  Symptoms: Stomach discomfort and nausea  Treatment/Intervention: Aminophylline 75 mg IV  Reason test stopped: protocol completed  After recovery IV was: Discontinued via X-ray tech and No redness or edema  Patient to return to Nuc. Med at :13:45  Patient discharged: Home  Patient's Condition upon discharge was: stable  Comments: symptoms resolved in recovery.  Erskine Speed T

## 2010-12-23 NOTE — Progress Notes (Signed)
Encounter addended by: Tawni Millers, RN on: 12/23/2010  3:28 PM<BR>     Documentation filed: Orders

## 2010-12-23 NOTE — Procedures (Signed)
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  BACKGROUND INFORMATION:  Rest heart rate was 59, blood pressure 120/68, and the EKG showed normal sinus rhythm at 59.  The patient was given Lexiscan injection, which caused no significant EKG changes.  Blood pressure remained fairly stable, however, he did have some mild shortness of breath, nausea, and cramping.  Due to persistence of this more than 5 minutes, had been given 75 mg of IV aminophylline for reversal of his symptoms.  Again, there were no acute EKG changes.  The perfusion images are to follow per Radiology.     Italy Zayven Powe, MD     CH/MEDQ  D:  12/22/2010  T:  12/22/2010  Job:  [id number]  cc:   Catalina Pizza, M.D. Fax: [phone]

## 2010-12-29 ENCOUNTER — Encounter (HOSPITAL_COMMUNITY)
Admission: RE | Admit: 2010-12-29 | Discharge: 2010-12-29 | Disposition: A | Discharge status: Home / Self Care | Payer: Medicare Other | Source: Ambulatory Visit | Attending: Orthopedic Surgery | Admitting: Orthopedic Surgery

## 2010-12-29 ENCOUNTER — Other Ambulatory Visit (HOSPITAL_COMMUNITY): Payer: Self-pay | Admitting: Orthopedic Surgery

## 2010-12-29 ENCOUNTER — Ambulatory Visit (HOSPITAL_COMMUNITY)
Admission: RE | Admit: 2010-12-29 | Discharge: 2010-12-29 | Disposition: A | Discharge status: Home / Self Care | Payer: Medicare Other | Source: Ambulatory Visit | Attending: Orthopedic Surgery | Admitting: Orthopedic Surgery

## 2010-12-29 DIAGNOSIS — J4489 Other specified chronic obstructive pulmonary disease: Secondary | ICD-10-CM | POA: Insufficient documentation

## 2010-12-29 DIAGNOSIS — Z01811 Encounter for preprocedural respiratory examination: Secondary | ICD-10-CM | POA: Insufficient documentation

## 2010-12-29 DIAGNOSIS — M169 Osteoarthritis of hip, unspecified: Secondary | ICD-10-CM

## 2010-12-29 DIAGNOSIS — M1610 Unilateral primary osteoarthritis, unspecified hip: Secondary | ICD-10-CM | POA: Insufficient documentation

## 2010-12-29 DIAGNOSIS — Z01812 Encounter for preprocedural laboratory examination: Secondary | ICD-10-CM | POA: Insufficient documentation

## 2010-12-29 DIAGNOSIS — J449 Chronic obstructive pulmonary disease, unspecified: Secondary | ICD-10-CM | POA: Insufficient documentation

## 2010-12-29 LAB — URINALYSIS, ROUTINE W REFLEX MICROSCOPIC
Bilirubin Urine: NEGATIVE
Glucose, UA: NEGATIVE mg/dL
Hgb urine dipstick: NEGATIVE
Ketones, ur: NEGATIVE mg/dL
Leukocytes, UA: NEGATIVE
Nitrite: NEGATIVE
Protein, ur: NEGATIVE mg/dL
Specific Gravity, Urine: 1.014 (ref 1.005–1.030)
Urobilinogen, UA: 0.2 mg/dL (ref 0.0–1.0)
pH: 6 (ref 5.0–8.0)

## 2010-12-29 LAB — SURGICAL PCR SCREEN
MRSA, PCR: NEGATIVE
Staphylococcus aureus: NEGATIVE

## 2010-12-29 LAB — COMPREHENSIVE METABOLIC PANEL
Albumin: 4.4 g/dL (ref 3.5–5.2)
Alkaline Phosphatase: 78 U/L (ref 39–117)
BUN: 13 mg/dL (ref 6–23)
Chloride: 101 mEq/L (ref 96–112)
Creatinine, Ser: 0.91 mg/dL (ref 0.50–1.35)
GFR calc Af Amer: >60 mL/min (ref >60)
Glucose, Bld: 95 mg/dL (ref 70–99)
Potassium: 4.1 mEq/L (ref 3.5–5.1)
Total Bilirubin: 0.3 mg/dL (ref 0.3–1.2)
Total Protein: 7.5 g/dL (ref 6.0–8.3)

## 2010-12-29 LAB — CBC
MCHC: 34 g/dL (ref 30.0–36.0)
Platelets: 165 10*3/uL (ref 150–400)
RDW: 14.2 % (ref 11.5–15.5)
WBC: 6 10*3/uL (ref 4.0–10.5)

## 2010-12-29 LAB — DIFFERENTIAL
Basophils Absolute: 0 10*3/uL (ref 0.0–0.1)
Basophils Relative: 1 % (ref 0–1)
Eosinophils Absolute: 0.1 10*3/uL (ref 0.0–0.7)
Eosinophils Relative: 2 % (ref 0–5)
Lymphocytes Relative: 30 % (ref 12–46)
Monocytes Absolute: 0.6 10*3/uL (ref 0.1–1.0)

## 2010-12-29 LAB — TYPE AND SCREEN
ABO/RH(D): A NEG
Antibody Screen: NEGATIVE

## 2010-12-29 LAB — APTT: aPTT: 31 seconds (ref 24–37)

## 2010-12-29 LAB — PROTIME-INR
INR: 1.05 (ref 0.00–1.49)
Prothrombin Time: 13.9 seconds (ref 11.6–15.2)

## 2010-12-30 LAB — URINE CULTURE
Culture  Setup Time: 201208011409
Culture: NO GROWTH

## 2011-01-03 ENCOUNTER — Inpatient Hospital Stay (HOSPITAL_COMMUNITY): Payer: Medicare Other

## 2011-01-03 ENCOUNTER — Inpatient Hospital Stay (HOSPITAL_COMMUNITY)
Admission: RE | Admit: 2011-01-03 | Discharge: 2011-01-06 | DRG: 470 | Disposition: A | Discharge status: Skilled Nursing Facility | Payer: Medicare Other | Source: Ambulatory Visit | Attending: Orthopedic Surgery | Admitting: Orthopedic Surgery

## 2011-01-03 DIAGNOSIS — K5900 Constipation, unspecified: Secondary | ICD-10-CM | POA: Diagnosis not present

## 2011-01-03 DIAGNOSIS — R197 Diarrhea, unspecified: Secondary | ICD-10-CM | POA: Diagnosis not present

## 2011-01-03 DIAGNOSIS — J4489 Other specified chronic obstructive pulmonary disease: Secondary | ICD-10-CM | POA: Diagnosis present

## 2011-01-03 DIAGNOSIS — D62 Acute posthemorrhagic anemia: Secondary | ICD-10-CM | POA: Diagnosis not present

## 2011-01-03 DIAGNOSIS — M1610 Unilateral primary osteoarthritis, unspecified hip: Principal | ICD-10-CM | POA: Diagnosis present

## 2011-01-03 DIAGNOSIS — J449 Chronic obstructive pulmonary disease, unspecified: Secondary | ICD-10-CM | POA: Diagnosis present

## 2011-01-03 DIAGNOSIS — C61 Malignant neoplasm of prostate: Secondary | ICD-10-CM | POA: Diagnosis present

## 2011-01-03 DIAGNOSIS — E039 Hypothyroidism, unspecified: Secondary | ICD-10-CM | POA: Diagnosis present

## 2011-01-03 DIAGNOSIS — K219 Gastro-esophageal reflux disease without esophagitis: Secondary | ICD-10-CM | POA: Diagnosis present

## 2011-01-03 DIAGNOSIS — I10 Essential (primary) hypertension: Secondary | ICD-10-CM | POA: Diagnosis present

## 2011-01-03 DIAGNOSIS — Z7982 Long term (current) use of aspirin: Secondary | ICD-10-CM

## 2011-01-03 DIAGNOSIS — M169 Osteoarthritis of hip, unspecified: Principal | ICD-10-CM | POA: Diagnosis present

## 2011-01-04 LAB — BASIC METABOLIC PANEL
Calcium: 7.9 mg/dL — ABNORMAL LOW (ref 8.4–10.5)
Creatinine, Ser: 0.8 mg/dL (ref 0.50–1.35)
GFR calc Af Amer: >60 mL/min (ref >60)
GFR calc non Af Amer: >60 mL/min (ref >60)
Sodium: 137 mEq/L (ref 135–145)

## 2011-01-04 LAB — CBC
MCV: 87.6 fL (ref 78.0–100.0)
Platelets: 125 10*3/uL — ABNORMAL LOW (ref 150–400)
RDW: 14.3 % (ref 11.5–15.5)
WBC: 5.9 10*3/uL (ref 4.0–10.5)

## 2011-01-05 LAB — BASIC METABOLIC PANEL
GFR calc non Af Amer: >60 mL/min (ref >60)
Glucose, Bld: 102 mg/dL — ABNORMAL HIGH (ref 70–99)
Potassium: 4.5 mEq/L (ref 3.5–5.1)
Sodium: 138 mEq/L (ref 135–145)

## 2011-01-05 LAB — CBC
Hemoglobin: 9 g/dL — ABNORMAL LOW (ref 13.0–17.0)
MCHC: 34 g/dL (ref 30.0–36.0)
RBC: 3.01 MIL/uL — ABNORMAL LOW (ref 4.22–5.81)

## 2011-01-06 ENCOUNTER — Inpatient Hospital Stay
Admission: RE | Admit: 2011-01-06 | Discharge: 2011-01-19 | Disposition: A | Discharge status: Home / Self Care | Payer: 59 | Source: Ambulatory Visit | Attending: Internal Medicine | Admitting: Internal Medicine

## 2011-01-06 LAB — CBC
Hemoglobin: 8.4 g/dL — ABNORMAL LOW (ref 13.0–17.0)
MCH: 29.5 pg (ref 26.0–34.0)
RBC: 2.85 MIL/uL — ABNORMAL LOW (ref 4.22–5.81)
WBC: 6.7 10*3/uL (ref 4.0–10.5)

## 2011-01-06 LAB — BASIC METABOLIC PANEL
CO2: 26 mEq/L (ref 19–32)
Chloride: 107 mEq/L (ref 96–112)
Glucose, Bld: 100 mg/dL — ABNORMAL HIGH (ref 70–99)
Potassium: 3.9 mEq/L (ref 3.5–5.1)
Sodium: 138 mEq/L (ref 135–145)

## 2011-01-07 ENCOUNTER — Ambulatory Visit (HOSPITAL_COMMUNITY): Payer: Medicare Other | Attending: Internal Medicine

## 2011-01-07 ENCOUNTER — Ambulatory Visit (HOSPITAL_COMMUNITY)
Admission: RE | Admit: 2011-01-07 | Discharge: 2011-01-07 | Disposition: A | Discharge status: Home / Self Care | Payer: Medicare Other | Source: Ambulatory Visit | Attending: Internal Medicine | Admitting: Internal Medicine

## 2011-01-07 DIAGNOSIS — R0602 Shortness of breath: Secondary | ICD-10-CM | POA: Insufficient documentation

## 2011-01-07 DIAGNOSIS — R509 Fever, unspecified: Secondary | ICD-10-CM | POA: Insufficient documentation

## 2011-01-12 NOTE — Op Note (Signed)
NAMEABRHAM, MASLOWSKI NO.:  [id number]  MEDICAL RECORD NO.:  [id number]  LOCATION:  5016                         FACILITY:  [facility]  PHYSICIAN:  Elana Alm. Thurston Hole, M.D. DATE OF BIRTH:  03-18-1925  DATE OF PROCEDURE:  01/03/2011 DATE OF DISCHARGE:                              OPERATIVE REPORT   PREOPERATIVE DIAGNOSIS:  Right hip degenerative joint disease.  POSTOPERATIVE DIAGNOSIS:  Right hip degenerative joint disease.  PROCEDURE:  Right total hip replacement using DePuy Press-Fit total hip system with acetabulum 56-mm Press-Fit, pinnacle acetabulum with one locking screw and 10-degree polyethylene liner.  Femoral component #6 Press-Fit Summit, femoral stem with -2 x 36 mm hip ball.  SURGEON:  Elana Alm. Thurston Hole, MD.  ASSISTANT:  Kirstin Shepperson, PA-C.  ANESTHESIA:  General.  OPERATIVE TIME:  1 hour 15 minutes.  ESTIMATED BLOOD LOSS:  150 mL.  COMPLICATIONS:  None.  DESCRIPTION OF PROCEDURE:  Mr. Word was brought to the operating room on January 03, 2011, placed on the operating table in supine position.  He received Ancef 2 grams IV preoperatively for prophylaxis.  After being placed under general anesthesia, he had a Foley catheter placed under sterile conditions.  His right hip was examined.  He had flexion of 90, extension to zero, internal and external rotation of 5 degrees. Approximately 1.5-cm of shortening of the right leg compared to the left.  He was then turned to the right lateral decubitus position and secured on the bed with a Stulberg frame.  His right hip and leg was prepped using sterile DuraPrep and draped using sterile technique.  Time- out procedure was called and the correct right hip identified. Initially through a 12-cm posterolateral greater trochanteric incision, initial exposure was made.  The underlying subcutaneous tissues were incised along with skin incision.  The iliotibial band and gluteus maximus fascia was incised  longitudinally revealing the underlying sciatic nerve which carefully protected.  Short external rotators of the hip and hip capsule were released off the femoral neck insertion intact and then the hip was posteriorly dislocated.  He was found to have grade 4 DJD throughout the femoral head.  A femoral neck cut was then made 2 cm above the lesser trochanter in the appropriate manner of anteversion and inclination.  Carefully retractors were placed around the acetabulum, degenerative acetabular labrum, and spurs were then removed. He was found to have grade 3 and 4 DJD in the acetabulum as well. Sequential acetabular reaming was then carried out in the appropriate manner of anteversion, abduction, and inclination up to a 55-mm size and a 56-mm trial was placed with an excellent fit.  It was then removed and the actual 56-mm pinnacle cup was hammered into position with an excellent fit in the appropriate manner of anteversion, abduction, and inclination.  One 20-mm locking screw was placed in 12 o'clock position, again with firm and tight fixation, and a 10-degree polyethylene liner was placed in position with a 10-degree lip in the posterolateral position.  At this point, the proximal femur was exposed.  Sequential reaming was carried out to a #6 size and broaching to a #6 sizer with a #6  broach in place with a -2 x 36 mm hip ball.  The hip was reduced, taken through full range of motion, found to be stable up to 80 degrees of internal rotation in both neutral and 30 degrees of adduction and also stable in abduction and external rotation, and leg lengths were found to be equalized.  At this point, it is felt that trial component was of excellent size and fit.  This femoral component was then removed. The femoral canal was jet lavage irrigated and then the #6 Summit femoral stem was hammered into position with an excellent fit and then the -2 x 36 mm hip ball was placed on the femoral neck  and tapped into position with an excellent Morse taper fit.  The hip was then reduced. Again, taken through full range of motion, found to be stable and leg lengths equal.  At this point, it was felt that all components were of excellent size, fit, and stability.  The wound was further irrigated and the short external rotators of the hip capsule was reattached to its femoral neck insertion through two drill holes in the greater trochanter.  Iliotibial band and gluteus maximus fascia was then closed with #1 Ethibond suture.  Subcutaneous tissues closed with zero and 2-0 Vicryl.  Subcuticular layer closed with 4-0 Monocryl.  Sterile dressings were applied in an abduction splint.  The patient turned supine, checked for leg lengths that were equal, rotation equal, pulses 2+ and symmetric.  Needle and sponge counts correct x2 at the end the case.  He was then awakened, extubated, and taken to recovery room in stable condition.     Destony Prevost A. Thurston Hole, M.D.     RAW/MEDQ  D:  01/04/2011  T:  01/04/2011  Job:  [id number]  Electronically Signed by Salvatore Marvel M.D. on 01/12/2011 04:54:27 PM

## 2011-01-13 LAB — GLUCOSE, CAPILLARY

## 2011-01-14 LAB — GLUCOSE, CAPILLARY
Glucose-Capillary: 123 mg/dL — ABNORMAL HIGH (ref 70–99)
Glucose-Capillary: 145 mg/dL — ABNORMAL HIGH (ref 70–99)

## 2011-01-17 LAB — GLUCOSE, CAPILLARY: Glucose-Capillary: 95 mg/dL (ref 70–99)

## 2011-02-01 LAB — GLUCOSE, CAPILLARY: Glucose-Capillary: 91 mg/dL (ref 70–99)

## 2011-02-01 NOTE — Discharge Summary (Signed)
NAMEELISON, WORREL NO.:  [id number]  MEDICAL RECORD NO.:  [id number]  LOCATION:  5016                         FACILITY:  [facility]  PHYSICIAN:  Elana Alm. Thurston Hole, M.D. DATE OF BIRTH:  1925-04-19  DATE OF ADMISSION:  01/03/2011 DATE OF DISCHARGE:                        DISCHARGE SUMMARY - REFERRING   ADMITTING DIAGNOSES:  End-stage degenerative joint disease right hip, hypertension, prostate cancer, hypothyroidism.  DISCHARGE DIAGNOSES:  End-stage degenerative joint disease right hip, status post right total hip replacement, postoperative blood loss anemia, constipation, now with slight bit of diarrhea, hypertension, prostate cancer and hypothyroidism.  HISTORY OF PRESENT ILLNESS:  The patient is an 77 year old white male with a history of end-stage DJD of his right hip.  He failed conservative care including anti-inflammatories and interarticular cortisone injection.  Risks, benefits and possible complications of a right total hip replacement were discussed in detail with the patient and his wife.  They are without question.  PROCEDURES IN-HOUSE:  On January 03, 2011, the patient underwent a right total hip replacement by Dr. Thurston Hole, tolerated the procedure well.  POSTOP COURSE:  The patient was admitted for a pain control DVT prophylaxis and physical therapy.  Postop day 1, the patient was alert and oriented.  Hemoglobin was 9.3, platelet count was 125 and white cell count was 5.9.  He was metabolically stable.  Surgical wound was well- approximated.  He struggled with physical therapy with difficulty recalling posterior total hip precautions.  Postop day 2, the patient was improved.  Biggest complaint was constipation.  Surgical wound was well-approximated.  He was metabolically stable.  His hemoglobin was 9.0, white cell count was 7.7 and platelet count had dropped to 114. Postop day 3, platelet count improved back to 122.  His hemoglobin is stable at 8.4.   Surgical wound is well-approximated and healing.  He is neurovascularly intact.  He is 1+ assist with ambulation and 2+ with bed mobility.  Once out of bed, he very easily ambulates to a bedside commode or to a chair.  He is being discharged to skilled nursing, 50% weightbearing, posterior total hip precautions.  He is on a regular diet supplemented with resource.  The patient is lactose intolerant, so he needs resource p.o. t.i.d. between meals.  He needs physical therapy daily and occupational therapy.  Wound is to be wiped every day with a chlorhexidine cloth and dressed with a dry dressing.  DISCHARGE MEDICATIONS: 1. Neurontin 100 mg daily. 2. Cozaar 50 mg daily, hold if blood pressure less than 120/80. 3. Hydrochlorothiazide 12.5 mg daily, hold if blood pressure less than     120/80. 4. Synthroid 50 mcg daily. 5. Lovenox 40 mg subcu daily.  This needs to be stopped on February 08, 2011. 6. Colace 100 mg 1 tablet twice a day, currently on hold due to loose     stools. 7. Flomax 0.4 mg daily. 8. Resource 240 mg 3 times a day.  He was getting peach at our     hospital. 9. Hydrocodone 1-2 q.4 h. p.r.n. pain.  He is being discharged to the [facility], 50% weightbearing, posterior total hip precautions, regular diet  supplemented by resource t.i.d. between meals, follow up with Dr. Thurston Hole on January 17, 2011.  Please call our office [phone] with increased pain, increased swelling, increased drainage or a temp greater than 101.     Kirstin Shepperson, PA-C   ______________________________ Elana Alm Thurston Hole, M.D.    KS/MEDQ  D:  01/06/2011  T:  01/06/2011  Job:  [id number]  Electronically Signed by Julien Girt P.A. on 01/19/2011 01:30:36 PM Electronically Signed by Salvatore Marvel M.D. on 02/01/2011 08:26:47 AM

## 2011-02-08 ENCOUNTER — Ambulatory Visit (HOSPITAL_COMMUNITY)
Admission: RE | Admit: 2011-02-08 | Discharge: 2011-02-08 | Disposition: A | Discharge status: Home / Self Care | Payer: Medicare Other | Source: Ambulatory Visit | Attending: Internal Medicine | Admitting: Internal Medicine

## 2011-02-08 ENCOUNTER — Other Ambulatory Visit (HOSPITAL_COMMUNITY): Payer: Self-pay | Admitting: Internal Medicine

## 2011-02-08 DIAGNOSIS — Z8701 Personal history of pneumonia (recurrent): Secondary | ICD-10-CM | POA: Insufficient documentation

## 2011-02-08 DIAGNOSIS — Z96649 Presence of unspecified artificial hip joint: Secondary | ICD-10-CM | POA: Insufficient documentation

## 2011-02-08 DIAGNOSIS — R0789 Other chest pain: Secondary | ICD-10-CM | POA: Insufficient documentation

## 2011-02-10 ENCOUNTER — Inpatient Hospital Stay (HOSPITAL_COMMUNITY)
Admission: EM | Admit: 2011-02-10 | Discharge: 2011-02-25 | DRG: 193 | Disposition: A | Discharge status: Home Health | Payer: Medicare Other | Attending: Internal Medicine | Admitting: Internal Medicine

## 2011-02-10 ENCOUNTER — Other Ambulatory Visit: Payer: Self-pay

## 2011-02-10 ENCOUNTER — Emergency Department (HOSPITAL_COMMUNITY): Payer: Medicare Other

## 2011-02-10 ENCOUNTER — Encounter (HOSPITAL_COMMUNITY): Payer: Self-pay | Admitting: Emergency Medicine

## 2011-02-10 DIAGNOSIS — K121 Other forms of stomatitis: Secondary | ICD-10-CM | POA: Diagnosis present

## 2011-02-10 DIAGNOSIS — E41 Nutritional marasmus: Secondary | ICD-10-CM | POA: Diagnosis present

## 2011-02-10 DIAGNOSIS — E43 Unspecified severe protein-calorie malnutrition: Secondary | ICD-10-CM | POA: Diagnosis present

## 2011-02-10 DIAGNOSIS — K5710 Diverticulosis of small intestine without perforation or abscess without bleeding: Secondary | ICD-10-CM | POA: Diagnosis present

## 2011-02-10 DIAGNOSIS — Z96649 Presence of unspecified artificial hip joint: Secondary | ICD-10-CM

## 2011-02-10 DIAGNOSIS — IMO0002 Reserved for concepts with insufficient information to code with codable children: Secondary | ICD-10-CM

## 2011-02-10 DIAGNOSIS — F329 Major depressive disorder, single episode, unspecified: Secondary | ICD-10-CM | POA: Diagnosis present

## 2011-02-10 DIAGNOSIS — D649 Anemia, unspecified: Secondary | ICD-10-CM | POA: Diagnosis present

## 2011-02-10 DIAGNOSIS — R627 Adult failure to thrive: Secondary | ICD-10-CM | POA: Diagnosis present

## 2011-02-10 DIAGNOSIS — Q394 Esophageal web: Secondary | ICD-10-CM

## 2011-02-10 DIAGNOSIS — K1379 Other lesions of oral mucosa: Secondary | ICD-10-CM

## 2011-02-10 DIAGNOSIS — Q391 Atresia of esophagus with tracheo-esophageal fistula: Secondary | ICD-10-CM

## 2011-02-10 DIAGNOSIS — M25551 Pain in right hip: Secondary | ICD-10-CM

## 2011-02-10 DIAGNOSIS — F3289 Other specified depressive episodes: Secondary | ICD-10-CM | POA: Diagnosis present

## 2011-02-10 DIAGNOSIS — J189 Pneumonia, unspecified organism: Principal | ICD-10-CM | POA: Diagnosis present

## 2011-02-10 DIAGNOSIS — R1310 Dysphagia, unspecified: Secondary | ICD-10-CM | POA: Diagnosis present

## 2011-02-10 DIAGNOSIS — K2980 Duodenitis without bleeding: Secondary | ICD-10-CM | POA: Diagnosis present

## 2011-02-10 DIAGNOSIS — E2740 Unspecified adrenocortical insufficiency: Secondary | ICD-10-CM

## 2011-02-10 DIAGNOSIS — Q393 Congenital stenosis and stricture of esophagus: Secondary | ICD-10-CM

## 2011-02-10 DIAGNOSIS — K224 Dyskinesia of esophagus: Secondary | ICD-10-CM | POA: Diagnosis present

## 2011-02-10 DIAGNOSIS — E876 Hypokalemia: Secondary | ICD-10-CM

## 2011-02-10 DIAGNOSIS — R531 Weakness: Secondary | ICD-10-CM | POA: Diagnosis present

## 2011-02-10 DIAGNOSIS — D509 Iron deficiency anemia, unspecified: Secondary | ICD-10-CM | POA: Diagnosis present

## 2011-02-10 DIAGNOSIS — N179 Acute kidney failure, unspecified: Secondary | ICD-10-CM | POA: Diagnosis not present

## 2011-02-10 DIAGNOSIS — E860 Dehydration: Secondary | ICD-10-CM | POA: Diagnosis present

## 2011-02-10 HISTORY — DX: Diverticulosis of intestine, part unspecified, without perforation or abscess without bleeding: K57.90

## 2011-02-10 HISTORY — DX: Hypothyroidism, unspecified: E03.9

## 2011-02-10 HISTORY — DX: Pneumonia, unspecified organism: J18.9

## 2011-02-10 HISTORY — DX: Anemia, unspecified: D64.9

## 2011-02-10 HISTORY — DX: Calculus of kidney: N20.0

## 2011-02-10 HISTORY — DX: Gastro-esophageal reflux disease without esophagitis: K21.9

## 2011-02-10 LAB — APTT: aPTT: 40 seconds — ABNORMAL HIGH (ref 24–37)

## 2011-02-10 LAB — CBC
MCH: 28 pg (ref 26.0–34.0)
MCV: 85.8 fL (ref 78.0–100.0)
Platelets: 320 10*3/uL (ref 150–400)
RBC: 3.32 MIL/uL — ABNORMAL LOW (ref 4.22–5.81)
RDW: 14.8 % (ref 11.5–15.5)

## 2011-02-10 LAB — DIFFERENTIAL
Basophils Absolute: 0 10*3/uL (ref 0.0–0.1)
Basophils Relative: 0 % (ref 0–1)
Eosinophils Absolute: 0.1 10*3/uL (ref 0.0–0.7)
Eosinophils Relative: 1 % (ref 0–5)
Lymphs Abs: 1 10*3/uL (ref 0.7–4.0)
Neutrophils Relative %: 79 % — ABNORMAL HIGH (ref 43–77)

## 2011-02-10 LAB — BASIC METABOLIC PANEL
Calcium: 9.1 mg/dL (ref 8.4–10.5)
GFR calc Af Amer: >60 mL/min (ref >60)
GFR calc non Af Amer: >60 mL/min (ref >60)
Potassium: 3.3 mEq/L — ABNORMAL LOW (ref 3.5–5.1)
Sodium: 138 mEq/L (ref 135–145)

## 2011-02-10 LAB — URINALYSIS, ROUTINE W REFLEX MICROSCOPIC
Ketones, ur: NEGATIVE mg/dL
Leukocytes, UA: NEGATIVE
Protein, ur: NEGATIVE mg/dL
Urobilinogen, UA: 0.2 mg/dL (ref 0.0–1.0)

## 2011-02-10 LAB — POCT I-STAT TROPONIN I: Troponin i, poc: 0.01 ng/mL (ref 0.00–0.08)

## 2011-02-10 LAB — PROTIME-INR: Prothrombin Time: 15.7 seconds — ABNORMAL HIGH (ref 11.6–15.2)

## 2011-02-10 MED ORDER — SODIUM CHLORIDE 0.9 % IV SOLN
INTRAVENOUS | Status: DC
  Administered 2011-02-10 (×2): via INTRAVENOUS

## 2011-02-10 MED ORDER — ONDANSETRON HCL 4 MG/2ML IJ SOLN
4.0000 mg | Freq: Four times a day (QID) | INTRAMUSCULAR | Status: DC | PRN
  Administered 2011-02-12 – 2011-02-22 (×5): 4 mg via INTRAVENOUS
  Filled 2011-02-10 (×5): qty 2

## 2011-02-10 MED ORDER — DOCUSATE SODIUM 100 MG PO CAPS
100.0000 mg | ORAL_CAPSULE | Freq: Two times a day (BID) | ORAL | Status: DC
  Administered 2011-02-10 – 2011-02-17 (×14): 100 mg via ORAL
  Filled 2011-02-10 (×14): qty 1

## 2011-02-10 MED ORDER — VANCOMYCIN HCL IN DEXTROSE 1-5 GM/200ML-% IV SOLN
INTRAVENOUS | Status: AC
  Filled 2011-02-10: qty 200

## 2011-02-10 MED ORDER — GUAIFENESIN ER 600 MG PO TB12
600.0000 mg | ORAL_TABLET | Freq: Two times a day (BID) | ORAL | Status: DC
  Administered 2011-02-10 – 2011-02-21 (×23): 600 mg via ORAL
  Filled 2011-02-10 (×23): qty 1

## 2011-02-10 MED ORDER — SODIUM CHLORIDE 0.9 % IV SOLN
INTRAVENOUS | Status: AC
  Administered 2011-02-11: via INTRAVENOUS

## 2011-02-10 MED ORDER — ACETAMINOPHEN 500 MG PO TABS
500.0000 mg | ORAL_TABLET | Freq: Four times a day (QID) | ORAL | Status: DC | PRN
  Administered 2011-02-14 – 2011-02-24 (×7): 500 mg via ORAL
  Filled 2011-02-10 (×7): qty 1

## 2011-02-10 MED ORDER — PIPERACILLIN-TAZOBACTAM 3.375 G IVPB
INTRAVENOUS | Status: AC
  Administered 2011-02-10: 3.375 g
  Filled 2011-02-10: qty 50

## 2011-02-10 MED ORDER — FERROUS SULFATE 325 (65 FE) MG PO TABS
325.0000 mg | ORAL_TABLET | Freq: Two times a day (BID) | ORAL | Status: DC
  Administered 2011-02-10 – 2011-02-13 (×6): 325 mg via ORAL
  Filled 2011-02-10 (×6): qty 1

## 2011-02-10 MED ORDER — ASPIRIN 81 MG PO CHEW
81.0000 mg | CHEWABLE_TABLET | Freq: Every day | ORAL | Status: DC
  Administered 2011-02-11 – 2011-02-13 (×2): 81 mg via ORAL
  Filled 2011-02-10 (×2): qty 1

## 2011-02-10 MED ORDER — ONDANSETRON HCL 4 MG PO TABS
4.0000 mg | ORAL_TABLET | Freq: Four times a day (QID) | ORAL | Status: DC | PRN
  Administered 2011-02-15: 4 mg via ORAL
  Filled 2011-02-10: qty 1

## 2011-02-10 MED ORDER — POTASSIUM CHLORIDE 20 MEQ PO PACK
40.0000 meq | PACK | Freq: Once | ORAL | Status: AC
  Administered 2011-02-10: 40 meq via ORAL
  Filled 2011-02-10: qty 2

## 2011-02-10 MED ORDER — BISACODYL 10 MG RE SUPP: 10.0000 mg | Freq: Every day | RECTAL | Status: DC | PRN

## 2011-02-10 MED ORDER — PIPERACILLIN-TAZOBACTAM 3.375 G IVPB
3.3750 g | Freq: Three times a day (TID) | INTRAVENOUS | Status: DC
  Administered 2011-02-11 – 2011-02-15 (×13): 3.375 g via INTRAVENOUS
  Filled 2011-02-10 (×14): qty 50

## 2011-02-10 MED ORDER — ALUM & MAG HYDROXIDE-SIMETH 200-200-20 MG/5ML PO SUSP: 30.0000 mL | Freq: Four times a day (QID) | ORAL | Status: DC | PRN

## 2011-02-10 MED ORDER — VANCOMYCIN HCL IN DEXTROSE 1-5 GM/200ML-% IV SOLN
1000.0000 mg | Freq: Once | INTRAVENOUS | Status: AC
  Administered 2011-02-10: 1000 mg via INTRAVENOUS
  Filled 2011-02-10: qty 200

## 2011-02-10 MED ORDER — SODIUM CHLORIDE 0.9 % IV SOLN: INTRAVENOUS | Status: DC

## 2011-02-10 MED ORDER — PIPERACILLIN-TAZOBACTAM 3.375 G IVPB
INTRAVENOUS | Status: AC
  Filled 2011-02-10: qty 100

## 2011-02-10 MED ORDER — SENNOSIDES-DOCUSATE SODIUM 8.6-50 MG PO TABS: 1.0000 | ORAL_TABLET | Freq: Every day | ORAL | Status: DC | PRN

## 2011-02-10 MED ORDER — PIPERACILLIN-TAZOBACTAM 4.5 G IVPB: 4.5000 g | Freq: Once | INTRAVENOUS | Status: DC

## 2011-02-10 MED ORDER — TRAMADOL HCL 50 MG PO TABS
50.0000 mg | ORAL_TABLET | Freq: Four times a day (QID) | ORAL | Status: DC | PRN
  Administered 2011-02-12 – 2011-02-20 (×3): 50 mg via ORAL
  Filled 2011-02-10 (×3): qty 1

## 2011-02-10 MED ORDER — VANCOMYCIN HCL IN DEXTROSE 1-5 GM/200ML-% IV SOLN
1000.0000 mg | Freq: Two times a day (BID) | INTRAVENOUS | Status: DC
  Administered 2011-02-11 – 2011-02-15 (×9): 1000 mg via INTRAVENOUS
  Filled 2011-02-10 (×9): qty 200

## 2011-02-10 MED ORDER — ENOXAPARIN SODIUM 40 MG/0.4ML ~~LOC~~ SOLN
40.0000 mg | SUBCUTANEOUS | Status: DC
  Administered 2011-02-10 – 2011-02-12 (×3): 40 mg via SUBCUTANEOUS
  Filled 2011-02-10 (×3): qty 0.4

## 2011-02-10 MED ORDER — LEVOTHYROXINE SODIUM 125 MCG PO TABS
62.5000 ug | ORAL_TABLET | Freq: Every day | ORAL | Status: DC
  Administered 2011-02-11 – 2011-02-25 (×15): 62.5 ug via ORAL
  Filled 2011-02-10 (×18): qty 1

## 2011-02-10 NOTE — ED Notes (Signed)
Pt states he was sent over by dr. Margo Aye for pneumonia and hgb of 9.3. Pt states he feels weak.

## 2011-02-10 NOTE — ED Notes (Signed)
Pt resting on stretcher with family at bedside. Family states pt had hip replacement 01/03/2011. Dx with pneumonia while recovering at [facility]. Family and pt" believe it never truly went away".   Pt denies chest pain, SOB , fever, N/V and chills.  Pt just states has no energy, and feels run down.

## 2011-02-10 NOTE — ED Notes (Signed)
Rn on floor unable to take report, will return call when able

## 2011-02-10 NOTE — H&P (Signed)
NAME:  Jorge Nixon, Jorge Nixon NO.:  [id number]  MEDICAL RECORD NO.:  [id number]  LOCATION:  A309                          FACILITY:  [facility]  PHYSICIAN:  Tarry Kos, MD       DATE OF BIRTH:  [birth year]  DATE OF ADMISSION:  02/10/2011 DATE OF DISCHARGE:  LH                             HISTORY & PHYSICAL   CHIEF COMPLAINT:  Weakness, cough, low-grade fever.  HISTORY OF PRESENT ILLNESS:  Jorge Nixon is a pleasant 77 year old male with a history of hypertension, hypothyroidism who recently had a right hip replacement on January 03, 2011, due to end-stage degenerative joint disease with significant amount of pain indicating for total hip replacement.  After his hip replacement, he went to short-term rehab and actually he was just discharged from [facility] 2 weeks ago.  Since his surgery, he has had some improvement and he has got to the point now that he is back in home, he uses a walker and ambulates also with a cane however, it sounds like he has had several bouts of pneumonia since his surgery.  He knows that he was treated with some antibiotics while he was in the [facility], however, he does not know what he was treated with and then actually he was just started on Levaquin 3 days ago by his primary care doctor and he is on day 3 today of Levaquin for pneumonia, so it sounds like he is at least had two bouts of pneumonia and he has also been complaining of some dysphagia of having trouble swallowing pills and certain solid foods.  He has never had any dysphagia before, no GI issues.  He does say occasionally he coughs when he does eat and he put himself on a soft diet because it seems to go down better when he softens things up.  He did not have a swallow evaluation while he was in his Rehab [facility], however, he was having the issue while he was there, but he is not aware that they actually checked his swallow.  Over the last couple weeks, he is living now with family  members, his daughter alone, his son are present right now.  They say that he has been recovering.  He states he is still in a significant amount of pain in the hip, however, he does not like taking anything stronger than Tylenol because other stronger pain medications make him too drowsy.  They are concerned because they do not feel like his appetite is normal since his surgery.  He denies any nausea, vomiting.  Denies any diarrhea.  He denies any red blood in stools, however he does say they are dark however, he has recently started on iron pills.  He says he just has been weak and has gotten better with his rehab, but he still is feeling generalized weakness and fatigue.  He has taken tramadol in the past which he likes to take tramadol because it does not make him drowsy and it does do a significant amount of improvement in his pain.  However, most of time he is just taking Tylenol.  His right hip is still painful,  but not as painful as it was before the surgery that is improving every day.  His incision around that hip, he has had no problems with.  He denies any chest pain, denies any abdominal pain, denies any lower extremity pain.  REVIEW OF SYSTEMS:  Otherwise negative.  PAST MEDICAL HISTORY: 1. History of hypertension, however, his blood pressure medications     have recently been stopped because of borderline low blood pressure     postoperatively.  Again it sounds like this is at least a second     bout of pneumonia without mention of aspiration in the past,     however with new complaints of dysphagia, it has not been worked     up. 2. Hypothyroidism. 3. Status post right hip joint replacement in the beginning of August     of 2012. 4. History of kidney stones in the past. 5. Status post TURP in 1997. 6. Status post total knee replacement in March of 2006. 7. GERD.  CURRENT MEDICATIONS: 1. Again, he was on Levaquin today is his third day of that. 2. He takes  acetaminophen as needed. 3. Aspirin 81 mg a day. 4. He is on prophylactic dosing for Lovenox 40 mg daily that was     actually completed on February 08, 2011, for postop orthopedic     surgery. 5. Iron sulfate 325 mg twice a day. 6. Synthroid 62.5 mcg daily. 7. Hyzaar was recently held. 8. He does take Senokot.  SOCIAL HISTORY:  He is a nonsmoker.  No alcohol, no IV drug abuse. Again he was recently discharged from [facility] 1-2 weeks ago, has been ambulating well with walker and a cane, lives with family members.  ALLERGIES:  SULFA causes rash and hives.  PHYSICAL EXAMINATION:  VITAL SIGNS:  Temperature is 98.2, pulse 96, respirations 20, blood pressure 147/84, 98% O2 sats on room air. GENERAL:  He is alert and oriented x4.  No apparent distress, cooperative and friendly. HEENT:  Extraocular muscles intact.  Pupils equal and reactive to light. Oropharynx clear.  Mucous membranes dry. NECK:  No JVD.  No carotid bruits. COR:  Regular rate and rhythm without murmurs or gallops. CHEST:  Clear to auscultation bilaterally.  No wheezes, rhonchi, rales, or crackles. ABDOMEN:  Soft, nontender, nondistended.  Positive bowel sounds.  No hepatosplenomegaly. EXTREMITIES:  No clubbing, cyanosis, or edema.  His incision of the right hip looks well-healed.  There is no evidence of infection at all. SKIN:  No rashes. PSYCHIATRIC:  Normal affect. NEUROLOGIC:  No focal neurologic deficits.  Cranial II-XII grossly intact.  I did a bedside swallow evaluation with water through straw.  He had no problems.  There was no evidence of a choking either.  LABS:  Potassium is slightly low at 3.3.  Sodium normal, BUN and creatinine normal.  White count is normal.  Hemoglobin 9.3, MCV is 85.8. He does have a left shift, neutrophils 79%, INR is normal.  Glucose is normal.  Urinalysis is negative.  Specific gravity is high.  He had Hemoccult in the ED that was reported negative, troponin  negative. Chest x-ray shows patchy infiltrates bilaterally with no evidence of pulmonary edema.  ASSESSMENT AND PLAN:  This is an 77 year old male with what appears to be pneumonia, high risk for aspiration, and healthcare-associated disease. 1. Bilateral pneumonia.  This is at least a second episode.  He is     currently on Levaquin, his third day, I am going to switch him  to     vancomycin and Zosyn IV renally dosed.  I am also going to do     aspiration workup due to his dysphagia, check a modified barium     swallow evaluation, and obtain a Speech Therapy evaluation.  Blood     cultures have been sent.  I am also going to order sputum culture. 2. Dysphagia as above.  Further recommendation depending on his     swallow evaluation. 3. Anemia of unclear etiology, probably iron deficient.  I am sending     off iron studies.  We will also Hemoccult his stools two more     times.  No overt bleeding. 4. Recent right hip repair with intolerance to pain medications     stronger than Tylenol and tramadol.  We will continue him on     Tylenol and tramadol as needed. 5. Hypertension with recent adjustment in his medications due to     borderline hypotension.  We will continue to hold his blood     pressure medications, monitor his blood pressure. 6. Mild dehydration, did provide him some IV fluids overnight. 7. Failure to thrive since his recent surgery.  We will obtain a     Nutrition consult and a Physical Therapy evaluation 8. Further recommendations pending over hospital course.                                           ______________________________ Tarry Kos, MD     RD/MEDQ  D:  02/10/2011  T:  02/10/2011  Job:  [id number]

## 2011-02-10 NOTE — Progress Notes (Signed)
[id number]

## 2011-02-10 NOTE — Consult Note (Signed)
ANTIBIOTIC CONSULT NOTE - INITIAL  Pharmacy Consult for vancomycin and zosyn Indication: rule out pneumonia  Allergies  Allergen Reactions  . Sulfa Antibiotics Rash   Patient Measurements: Height: 5\' 10"  (177.8 cm) Weight: 141 lb (63.957 kg) IBW/kg (Calculated) : 73   Vital Signs: Temp: 98.2 F (36.8 C) (09/13 2030) Temp src: Oral (09/13 2030) BP: 147/84 mmHg (09/13 2030) Pulse Rate: 96  (09/13 2030) Intake/Output from previous day:   Intake/Output from this shift:    Labs:  [facility] 02/10/11 1541  WBC 8.4  HGB 9.3*  PLT 320  LABCREA --  CREATININE 0.85  CRCLEARANCE --   No results found for this basename: VANCOTROUGH:2,VANCOPEAK:2,VANCORANDOM:2,GENTTROUGH:2,GENTPEAK:2,GENTRANDOM:2,TOBRATROUGH:2,TOBRAPEAK:2,TOBRARND:2,AMIKACINPEAK:2,AMIKACINTROU:2,AMIKACIN:2, in the last 72 hours   Microbiology: No results found for this or any previous visit (from the past 720 hour(s)).  Medical History: Past Medical History  Diagnosis Date  . Hypertension   . Diverticular disease   . GERD (gastroesophageal reflux disease)   . Coronary artery disease   . Kidney stone   . Hypothyroidism    Medications:  Scheduled:    . aspirin  81 mg Oral Daily  . docusate sodium  100 mg Oral BID  . enoxaparin  40 mg Subcutaneous Q24H  . ferrous sulfate  325 mg Oral BID  . guaiFENesin  600 mg Oral BID  . levothyroxine  62.5 mcg Oral QAC breakfast  . piperacillin-tazobactam      . piperacillin-tazobactam (ZOSYN)  IV  3.375 g Intravenous Q8H  . potassium chloride  40 mEq Oral Once  . vancomycin  1,000 mg Intravenous Once  . vancomycin  1,000 mg Intravenous Q12H  . DISCONTD: sodium chloride   Intravenous STAT  . DISCONTD: piperacillin-tazobactam (ZOSYN)  IV  4.5 g Intravenous Once   Assessment: Renal fxn OK  Goal of Therapy:  Vancomycin trough level 15-20 mcg/ml  Plan: Vancomycin 1gm q12hrs Check trough at steady state  Zosyn 3.375gm q8hrs Monitor renal fxn  Valrie Hart  A 02/10/2011,10:17 PM

## 2011-02-10 NOTE — ED Provider Notes (Signed)
History     CSN: [id number] Arrival date & time: 02/10/2011  3:07 PM Scribed for Laray Anger, DO, the patient was seen in room APA11/APA11. This chart was scribed by Katha Cabal. This patient's care was started at 3:17PM.     Chief Complaint  Patient presents with  . Pneumonia  . Anemia   HPI  Dr. Clarene Duke entered the patients room at 3:17PM.   CAMP GOPAL is a 77 y.o. male who presents to the Emergency Department complaining of gradual onset and persistence of constant generalized weakness/fatigue, "dark" stools and SOB for the past several weeks.  SOB and fatigue worsens on exertion.  Describes his fatigue as "feeling worn down" and "having no energy."  Pt has been eval by his PMD for same, and was sent to the ED for further eval/admit for "pneumonia" and "low Hgb."  Denies CP/palpitations, no cough, no fevers, no abd pain, no N/V/D, no back pain.     PAST MEDICAL HISTORY:  Past Medical History  Diagnosis Date  . Hypertension   . Diverticular disease   . GERD (gastroesophageal reflux disease)   . Coronary artery disease   . Kidney stone   . Hypothyroidism     PAST SURGICAL HISTORY:  Past Surgical History  Procedure Date  . Total hip arthroplasty   . Replacement total knee   . Transurethral resection of prostate     MEDICATIONS:  Previous Medications   ASPIRIN 81 MG TABLET    Take 81 mg by mouth daily.     GABAPENTIN (NEURONTIN) 100 MG CAPSULE    Take 100 mg by mouth daily.     LEVOTHYROXINE (SYNTHROID, LEVOTHROID) 50 MCG TABLET    Take 50 mcg by mouth daily.     LOSARTAN-HYDROCHLOROTHIAZIDE (HYZAAR) 50-12.5 MG PER TABLET    Take by mouth daily.     TRAMADOL (ULTRAM) 50 MG TABLET    Take 50 mg by mouth every 6 (six) hours as needed.       ALLERGIES:  Allergies as of 02/10/2011 - Review Complete 02/10/2011  Allergen Reaction Noted  . Sulfa antibiotics Rash 12/23/2010    SOCIAL HISTORY: History   Social History  . Marital Status: Married    Spouse Name:  N/A    Number of Children: N/A  . Years of Education: N/A   Social History Main Topics  . Smoking status: Never Smoker   . Smokeless tobacco: None  . Alcohol Use: No  . Drug Use: No  . Sexually Active:    Other Topics Concern  . None   Social History Narrative  . None    Review of Systems ROS: Statement: All systems negative except as marked or noted in the HPI; Constitutional: Negative for fever and chills. ; ; Eyes: Negative for eye pain, redness and discharge. ; ; ENMT: Negative for ear pain, hoarseness, nasal congestion, sinus pressure and sore throat. ; ; Cardiovascular: +SOB, esp on exertion. Negative for chest pain, palpitations, diaphoresis, and peripheral edema. ; ; Respiratory: Negative for cough, wheezing and stridor. ; ; Gastrointestinal: Negative for nausea, vomiting, diarrhea and abdominal pain, blood in stool, hematemesis, jaundice and rectal bleeding. +black stools . ; ; Genitourinary: Negative for dysuria, flank pain and hematuria. ; ; Musculoskeletal: Negative for back pain and neck pain. Negative for swelling and trauma.; ; Skin: Negative for pruritus, rash, abrasions, blisters, bruising and skin lesion.; ; Neuro: Negative for headache, lightheadedness and neck stiffness. +generalized weakness. Negative for altered level of consciousness ,  altered mental status, extremity weakness, paresthesias, involuntary movement, seizure and syncope.     Physical Exam    BP 128/78  Pulse 106  Temp(Src) 98.2 F (36.8 C) (Oral)  Resp 18  Ht 5\' 10"  (1.778 m)  Wt 141 lb (63.957 kg)  BMI 20.23 kg/m2  SpO2 98% Patient Vitals for the past 24 hrs:  BP Temp Temp src Pulse Resp SpO2 Height Weight  02/10/11 2030 147/84 mmHg 98.2 F (36.8 C) Oral 96  20  98 % - -  02/10/11 1931 155/81 mmHg 98.5 F (36.9 C) - 96  20  98 % - -  02/10/11 1800 141/81 mmHg - - 91  22  97 % - -  02/10/11 1751 142/83 mmHg - - 92  20  98 % - -  02/10/11 1255 128/78 mmHg 98.2 F (36.8 C) Oral 106  18  98 %  5\' 10"  (1.778 m) 141 lb (63.957 kg)     Physical Exam 1520: Physical examination:  Nursing notes reviewed; Vital signs and O2 SAT reviewed;  Constitutional: Well developed, Well nourished, In no acute distress; Head:  Normocephalic, atraumatic; Eyes: EOMI, PERRL, No scleral icterus; ENMT: Mouth and pharynx normal, Mucous membranes dry; Neck: Supple, Full range of motion, No lymphadenopathy; Cardiovascular: Regular rate and rhythm, No murmur, rub, or gallop; Respiratory: Breath sounds clear & equal bilaterally, No rales, rhonchi, wheezes, or rub, Normal respiratory effort/excursion; Chest: Nontender, Movement normal; Abdomen: Soft, Nontender, Nondistended, Normal bowel sounds; Rectal exam performed w/permission of pt and ED Tech chaparone present.  Anal tone normal.  Non-tender, soft black stool in rectal vault, heme neg.  No fissures, no external hemorrhoids, no palp masses. Extremities: Pulses normal, No tenderness, No edema, No calf edema or asymmetry. +well healing surgical site right hip without erythema, edema, drainage.; Neuro: AA&Ox3, speech clear, no facial droop.  Major CN grossly intact.  No gross focal motor or sensory deficits in extremities.; Skin: Color pale, Warm, Dry.   ED Course  Procedures  MDM:  MDM Reviewed: previous chart, nursing note and vitals Reviewed previous: labs and ECG Interpretation: labs, ECG and x-ray    Date: 02/10/2011  Rate: 78  Rhythm: normal sinus rhythm  QRS Axis: left  Intervals: normal  ST/T Wave abnormalities: normal  Conduction Disutrbances:none  Narrative Interpretation:   Old EKG Reviewed: unchanged, no signif changes from previous EKG dated 10/13/2008.  Results for orders placed during the hospital encounter of 02/10/11  TYPE AND SCREEN      Component Value Range   ABO/RH(D) A NEG     Antibody Screen PENDING     Sample Expiration 02/13/2011    CBC      Component Value Range   WBC 8.4  4.0 - 10.5 (K/uL)   RBC 3.32 (*) 4.22 - 5.81  (MIL/uL)   Hemoglobin 9.3 (*) 13.0 - 17.0 (g/dL)   HCT 84.6 (*) 96.2 - 52.0 (%)   MCV 85.8  78.0 - 100.0 (fL)   MCH 28.0  26.0 - 34.0 (pg)   MCHC 32.6  30.0 - 36.0 (g/dL)   RDW 95.2  84.1 - 32.4 (%)   Platelets 320  150 - 400 (K/uL)  DIFFERENTIAL      Component Value Range   Neutrophils Relative 79 (*) 43 - 77 (%)   Neutro Abs 6.6  1.7 - 7.7 (K/uL)   Lymphocytes Relative 12  12 - 46 (%)   Lymphs Abs 1.0  0.7 - 4.0 (K/uL)   Monocytes Relative 8  3 - 12 (%)   Monocytes Absolute 0.7  0.1 - 1.0 (K/uL)   Eosinophils Relative 1  0 - 5 (%)   Eosinophils Absolute 0.1  0.0 - 0.7 (K/uL)   Basophils Relative 0  0 - 1 (%)   Basophils Absolute 0.0  0.0 - 0.1 (K/uL)  BASIC METABOLIC PANEL      Component Value Range   Sodium 138  135 - 145 (mEq/L)   Potassium 3.3 (*) 3.5 - 5.1 (mEq/L)   Chloride 100  96 - 112 (mEq/L)   CO2 29  19 - 32 (mEq/L)   Glucose, Bld 93  70 - 99 (mg/dL)   BUN 12  6 - 23 (mg/dL)   Creatinine, Ser 0.45  0.50 - 1.35 (mg/dL)   Calcium 9.1  8.4 - 40.9 (mg/dL)   GFR calc non Af Amer >60  >60 (mL/min)   GFR calc Af Amer >60  >60 (mL/min)  APTT      Component Value Range   aPTT 40 (*) 24 - 37 (seconds)  PROTIME-INR      Component Value Range   Prothrombin Time 15.7 (*) 11.6 - 15.2 (seconds)   INR 1.22  0.00 - 1.49   POCT I-STAT TROPONIN I      Component Value Range   Troponin i, poc 0.01  0.00 - 0.08 (ng/mL)   Comment 3            Dg Chest 2 View  02/10/2011  *RADIOLOGY REPORT*  Clinical Data: Rule out infiltrate  CHEST - 2 VIEW  Comparison: 02/08/2011  Findings: Cardiomediastinal silhouette is stable.  Central bronchitic changes again noted.  Patchy basilar infiltrate again noted.  There is patchy infiltrate in the right upper lobe.  No pulmonary edema.  IMPRESSION: Patchy infiltrates right upper lobe and bilateral basilar.  No pulmonary edema.  Original Report Authenticated By: Natasha Mead, M.D.   Results for DICKIE, CLOE (MRN [id number]) as of 02/10/2011  17:30    Results for JESTIN, BURBACH (MRN [id number]) as of 02/10/2011 17:30  Ref. Range 01/05/2011 05:41 01/06/2011 05:30 02/10/2011 15:41  HGB Latest Range: 13.0-17.0 g/dL 9.0 (L) 8.4 (L) 9.3 (L)  HCT Latest Range: 39.0-52.0 % 26.5 (L) 25.0 (L) 28.5 (L)    5:32 PM:  Will replete potassium PO.  H/H today improved from previous.  Will start IV abx for HCAP.  Dx testing d/w pt and family.  Questions answered.  Verb understanding, agreeable to admit.  5:34 PM:  T/C to [facility] Dr. Ardyth Harps, case discussed, including:  HPI, pertinent PM/SHx, VS/PE, dx testing, ED course and treatment.  Agreeable to admit.  Requests to write temporary orders, med/surg bed.     Medications given in ED:  0.9 %  sodium chloride infusion (  Intravenous New Bag 02/10/11 1530)  potassium chloride (KLOR-CON) packet 40 mEq (not administered)  piperacillin-tazobactam (ZOSYN) IVPB 4.5 g (not administered)  vancomycin (VANCOCIN) IVPB 1000 mg/200 mL premix (not administered)      I personally performed the services described in this documentation, which was scribed in my presence. The recorded information has been reviewed and considered. Laray Anger, DO  [facility] Judie Petit      Laray Anger, DO 02/10/11 2241

## 2011-02-11 LAB — CBC
MCH: 27.9 pg (ref 26.0–34.0)
MCV: 86.1 fL (ref 78.0–100.0)
Platelets: 324 10*3/uL (ref 150–400)
RBC: 3.23 MIL/uL — ABNORMAL LOW (ref 4.22–5.81)
RDW: 14.8 % (ref 11.5–15.5)
WBC: 6.7 10*3/uL (ref 4.0–10.5)

## 2011-02-11 LAB — IRON AND TIBC
Iron: 10 ug/dL — ABNORMAL LOW (ref 42–135)
Saturation Ratios: 6 % — ABNORMAL LOW (ref 20–55)
Saturation Ratios: 12 % — ABNORMAL LOW (ref 20–55)
TIBC: 181 ug/dL — ABNORMAL LOW (ref 215–435)
TIBC: 150 ug/dL — ABNORMAL LOW (ref 215–435)
UIBC: 171 ug/dL (ref 125–400)
UIBC: 132 ug/dL (ref 125–400)

## 2011-02-11 LAB — OCCULT BLOOD X 1 CARD TO LAB, STOOL
Fecal Occult Bld: POSITIVE
Fecal Occult Bld: NEGATIVE

## 2011-02-11 LAB — BASIC METABOLIC PANEL
CO2: 28 mEq/L (ref 19–32)
Calcium: 8.8 mg/dL (ref 8.4–10.5)
Creatinine, Ser: 0.81 mg/dL (ref 0.50–1.35)
GFR calc non Af Amer: >60 mL/min (ref >60)
Sodium: 140 mEq/L (ref 135–145)

## 2011-02-11 LAB — PROTIME-INR: Prothrombin Time: 16.1 seconds — ABNORMAL HIGH (ref 11.6–15.2)

## 2011-02-11 LAB — TSH: TSH: 4.36 u[IU]/mL (ref 0.350–4.500)

## 2011-02-11 LAB — VITAMIN B12
Vitamin B-12: 339 pg/mL (ref 211–911)
Vitamin B-12: 398 pg/mL (ref 211–911)

## 2011-02-11 LAB — FOLATE
Folate: 17.6 ng/mL
Folate: 15.1 ng/mL

## 2011-02-11 LAB — FERRITIN: Ferritin: 494 ng/mL — ABNORMAL HIGH (ref 22–322)

## 2011-02-11 MED ORDER — SODIUM CHLORIDE 0.9 % IV SOLN
INTRAVENOUS | Status: DC
  Administered 2011-02-11: 1000 mL via INTRAVENOUS
  Administered 2011-02-12 (×2): via INTRAVENOUS

## 2011-02-11 NOTE — Progress Notes (Signed)
UR Chart Review Completed  

## 2011-02-11 NOTE — Progress Notes (Addendum)
Subjective: Feels well. No acute events/complaints.  Objective: Vital signs in last 24 hours: Temp:  [97.4 F (36.3 C)-98.5 F (36.9 C)] 97.4 F (36.3 C) (09/14 0600) Pulse Rate:  [83-106] 83  (09/14 0600) Resp:  [18-22] 20  (09/14 0600) BP: (128-155)/(73-84) 153/73 mmHg (09/14 0600) SpO2:  [97 %-99 %] 99 % (09/14 0600) Weight:  [63.957 kg (141 lb)-64.8 kg (142 lb 13.7 oz)] 142 lb 13.7 oz (64.8 kg) (09/13 2236) Weight change:  Last BM Date: 02/10/11  Intake/Output from previous day: 09/13 0701 - 09/14 0700 In: 1269 [P.O.:360; I.V.:859; IV Piggyback:50] Out: 550 [Urine:550]     Physical Exam: General: Alert, awake, oriented x3, in no acute distress. HEENT: No bruits, no goiter. Heart: Regular rate and rhythm, without murmurs, rubs, gallops. Lungs: Bibasilar crackles and rhonchi, no wheezing. Abdomen: Soft, nontender, nondistended, positive bowel sounds. Extremities: No clubbing cyanosis or edema with positive pedal pulses. Neuro: Grossly intact, nonfocal.    Lab Results: Basic Metabolic Panel:  Basename 02/11/11 0419 02/10/11 1541  NA 140 138  K 3.8 3.3*  CL 104 100  CO2 28 29  GLUCOSE 92 93  BUN 8 12  CREATININE 0.81 0.85  CALCIUM 8.8 9.1  MG -- --  PHOS -- --   Liver Function Tests: No results found for this basename: AST:2,ALT:2,ALKPHOS:2,BILITOT:2,PROT:2,ALBUMIN:2 in the last 72 hours No results found for this basename: LIPASE:2,AMYLASE:2 in the last 72 hours No results found for this basename: AMMONIA:2 in the last 72 hours CBC:  Basename 02/11/11 0419 02/10/11 1541  WBC 6.7 8.4  NEUTROABS -- 6.6  HGB 9.0* 9.3*  HCT 27.8* 28.5*  MCV 86.1 85.8  PLT 324 320   Cardiac Enzymes: No results found for this basename: CKTOTAL:3,CKMB:3,CKMBINDEX:3,TROPONINI:3 in the last 72 hours BNP:  Basename 02/10/11 1541  POCBNP 1046.0*   D-Dimer: No results found for this basename: DDIMER:2 in the last 72 hours CBG: No results found for this basename: GLUCAP:6  in the last 72 hours Hemoglobin A1C: No results found for this basename: HGBA1C in the last 72 hours Fasting Lipid Panel: No results found for this basename: CHOL,HDL,LDLCALC,TRIG,CHOLHDL,LDLDIRECT in the last 72 hours Thyroid Function Tests: No results found for this basename: TSH,T4TOTAL,FREET4,T3FREE,THYROIDAB in the last 72 hours Anemia Panel: No results found for this basename: VITAMINB12,FOLATE,FERRITIN,TIBC,IRON,RETICCTPCT in the last 72 hours Urine Drug Screen:  Alcohol Level: No results found for this basename: ETH:2 in the last 72 hours Urinalysis:  Misc. Labs:  Recent Results (from the past 240 hour(s))  CULTURE, SPUTUM-ASSESSMENT     Status: Normal   Collection Time   02/11/11  5:30 AM      Component Value Range Status Comment   Specimen Description SPUTUM   Final    Special Requests Normal   Final    Sputum evaluation     Final    Value: MICROSCOPIC FINDINGS SUGGEST THAT THIS SPECIMEN IS NOT REPRESENTATIVE OF LOWER RESPIRATORY SECRETIONS. PLEASE RECOLLECT.   Report Status 02/11/2011 FINAL   Final     Studies/Results: Dg Chest 2 View  02/10/2011  *RADIOLOGY REPORT*  Clinical Data: Rule out infiltrate  CHEST - 2 VIEW  Comparison: 02/08/2011  Findings: Cardiomediastinal silhouette is stable.  Central bronchitic changes again noted.  Patchy basilar infiltrate again noted.  There is patchy infiltrate in the right upper lobe.  No pulmonary edema.  IMPRESSION: Patchy infiltrates right upper lobe and bilateral basilar.  No pulmonary edema.  Original Report Authenticated By: Natasha Mead, M.D.    Medications: Scheduled Meds:   .  aspirin  81 mg Oral Daily  . docusate sodium  100 mg Oral BID  . enoxaparin  40 mg Subcutaneous Q24H  . ferrous sulfate  325 mg Oral BID  . guaiFENesin  600 mg Oral BID  . levothyroxine  62.5 mcg Oral QAC breakfast  . piperacillin-tazobactam      . piperacillin-tazobactam (ZOSYN)  IV  3.375 g Intravenous Q8H  . potassium chloride  40 mEq Oral Once   . vancomycin  1,000 mg Intravenous Once  . vancomycin  1,000 mg Intravenous Q12H  . DISCONTD: sodium chloride   Intravenous STAT  . DISCONTD: piperacillin-tazobactam (ZOSYN)  IV  4.5 g Intravenous Once   Continuous Infusions:   . sodium chloride 75 mL/hr at 02/11/11 0028  . DISCONTD: sodium chloride 75 mL/hr at 02/10/11 1845   PRN Meds:.acetaminophen, alum & mag hydroxide-simeth, bisacodyl, ondansetron (ZOFRAN) IV, ondansetron, senna-docusate, traMADol  EGD/Colonoscopy 2007: FINAL DIAGNOSIS:  1. Erosive reflux esophagitis with a small sliding hiatal hernia.  2. Small polyp involving the second part of the duodenum which did not  appear to be at the ampulla.  3. It was biopsied for histology. If this is an adenoma, he would need to  return for a polypectomy with the duodenoscope.  4. Pan colonic diverticulosis. He has a moderate number of diverticula at  the sigmoid colon and a few more proximal in the proximal colon.  5. A 2 x 5 mm polyp ablated via cold biopsy from transfer transverse  colon.    Assessment/Plan:   1.  *PNA (pneumonia): I agree with vancomycin and Zosyn to cover potential hospital-acquired pathogens. I also believe that the pneumonia has a strong component of aspiration. Speech therapy has been consulted to perform a modified barium swallow. 2.  Dysphagia: Continue with dysphagia 3 diet pending speech therapy recommendations. 3.  Anemia: Hemoglobin is 9. His FOBT is positive. Please see above for EGD/colonoscopy results from 2007. He did have erosive reflux esophagitis which could be contributing to his anemia and heme-positive stools. We'll make sure he is on a proton pump inhibitor. I will check an anemia panel and consider reconsulting GI. It appears Dr. Karilyn Cota has seen him in the past. 4.  Weakness generalized: PT evaluation. 5.  S/P hip replacement right 6.  Pain, joint, hip, right  7. DVT prophylaxis: Lovenox.    LOS: 1 day   HERNANDEZ  ACOSTA,ESTELA 02/11/2011, 10:19 AM

## 2011-02-11 NOTE — Progress Notes (Signed)
Physical Therapy Evaluation Patient Name: Jorge Nixon Date: 02/11/2011 Problem List:  Patient Active Problem List  Diagnoses  . PNA (pneumonia)  . Dysphagia  . Anemia  . Weakness generalized  . S/P hip replacement right  . Pain, joint, hip, right   Past Medical History:  Past Medical History  Diagnosis Date  . Hypertension   . Diverticular disease   . GERD (gastroesophageal reflux disease)   . Coronary artery disease   . Kidney stone   . Hypothyroidism    Past Surgical History:  Past Surgical History  Procedure Date  . Total hip arthroplasty   . Replacement total knee   . Transurethral resection of prostate     Precautions/Restrictions  Precautions Precautions: Anterior Hip Restrictions Weight Bearing Restrictions: No Prior Functioning  Home Living Type of Home: House Lives With: Spouse Receives Help From: Family Home Layout: One level Home Access: Stairs to enter Entrance Stairs-Rails: Right Entrance Stairs-Number of Steps: 4 Bathroom Shower/Tub: Tub/shower unit Home Adaptive Equipment: Environmental consultant - rolling;Straight cane Prior Function Level of Independence: Independent with basic ADLs Driving: Yes Vocation: Retired Producer, television/film/video: Awake/alert Overall Cognitive Status: Appears within functional limits for tasks assessed Orientation Level: Oriented X4 Sensation/Coordination Sensation Light Touch: Appears Intact Coordination Gross Motor Movements are Fluid and Coordinated: Yes Fine Motor Movements are Fluid and Coordinated: Not tested Extremity Assessment RLE Assessment RLE Assessment: Within Functional Limits LLE Assessment LLE Assessment: Within Functional Limits Mobility (including Balance) Bed Mobility Bed Mobility: Yes Left Sidelying to Sit: 6: Modified independent (Device/Increase time) Supine to Sit: 6: Modified independent (Device/Increase time) Sit to Supine - Right: 6: Modified independent (Device/Increase  time) Transfers Transfers: Yes Stand Pivot Transfers: 6: Modified independent (Device/Increase time) Ambulation/Gait Ambulation/Gait: Yes Ambulation/Gait Assistance: 6: Modified independent (Device/Increase time) Ambulation Distance (Feet): 200 Feet Assistive device: Rolling walker Gait Pattern: Decreased step length - right;Decreased step length - left Gait velocity: slow Stairs: No  Posture/Postural Control Posture/Postural Control: No significant limitations Balance Balance Assessed: No Exercise  Total Joint Exercises Hip ABduction/ADduction: Strengthening;Both;10 reps;Supine Straight Leg Raises: Strengthening;10 reps;Both;Supine General Exercises - Lower Extremity Hip ABduction/ADduction: Strengthening;Both;10 reps;Supine Straight Leg Raises: Strengthening;10 reps;Both;Supine Hip Flexion/Marching:  (bridge x 10 ) Low Level/ICU Exercises Hip ABduction/ADduction: Strengthening;Both;10 reps;Supine  End of Session PT - End of Session Equipment Utilized During Treatment: Gait belt Activity Tolerance: Patient tolerated treatment well Patient left: in bed;with call bell in reach;with family/visitor present Nurse Communication: Mobility status for transfers General Behavior During Session: [facility] for tasks performed Cognition: [facility] for tasks performed PT Assessment/Plan/Recommendation PT Assessment Clinical Impression Statement: Pt with good mobility.  No need for skilled acute PT at this time. PT Recommendation/Assessment: All further PT needs can be met in the next venue of care PT Problem List: Decreased strength PT Recommendation Follow Up Recommendations: Other (comment) (out patient physical therapy) Equipment Recommended: None recommended by PT PT Goals    RUSSELL,CINDY 02/11/2011, 1:40 PM

## 2011-02-11 NOTE — Progress Notes (Signed)
Speech Language/Pathology Clinical/Bedside Swallow Evaluation Patient Details  Name: ELIZER BOSTIC MRN: [id number] Date of Birth: [birth year]  Today's Date: 02/11/2011  Past Medical History:  Past Medical History  Diagnosis Date  . Hypertension   . Diverticular disease   . GERD (gastroesophageal reflux disease)   . Coronary artery disease   . Kidney stone   . Hypothyroidism    Past Surgical History:  Past Surgical History  Procedure Date  . Total hip arthroplasty   . Replacement total knee   . Transurethral resection of prostate     Prior Functional Status  Type of Home: House Lives With: Spouse Receives Help From: Family Vocation: Retired  General  Date of Onset: 02/10/11 Type of Study: Bedisde swallow evaluation Diet Prior to this Study: Dysphagia 3 (soft);Thin liquids Temperature Spikes Noted: No Respiratory Status: Supplemental O2 delivered via (comment) History of Intubation:  (with surgery aug 2012) Behavior/Cognition: Alert;Cooperative;Pleasant mood Oral Cavity - Dentition: Dentures, top;Dentures, bottom Vision: Functional for self-feeding Patient Positioning: Upright in bed Baseline Vocal Quality: Normal Volitional Cough: Strong Volitional Swallow: Able to elicit  Oral Motor/Sensory Function  Labial ROM: Within Functional Limits Labial Symmetry: Within Functional Limits Labial Strength: Within Functional Limits Lingual ROM: Within Functional Limits Lingual Symmetry: Within Functional Limits Lingual Strength: Within Functional Limits Lingual Sensation: Within Functional Limits Facial ROM: Within Functional Limits Facial Symmetry: Within Functional Limits Facial Strength: Within Functional Limits Velum: Within Functional Limits Mandible: Within Functional Limits  Consistency Results     Thin Liquid Thin Liquid: Within functional limits Presentation: Cup;Straw Other Comments:  (no s/s with thin liquids)        Puree Puree: Within functional  limits Presentation: Self Fed;Spoon Other Comments:  (no s/s with purees)  Assessment/Recommendations/Treatment Plan Solid Solid: Impaired Presentation: Self Fed;Spoon Oral Phase Functional Implications:  (slight increase oral transit time but cleared oral cavity) Pharyngeal Phase Impairments: Other (comments) (good laryngeal elevation and timely swallow) Other Comments:  (question lower pharyngeal vs esophageal dysphagia)  Suspected Esophageal Findings Suspected Esophageal Findings:  (pt c/o food stuck, discomfort, and difficulty with hard bolu)  Aspiration Risk Risk for Aspiration:  (question esophageal reflux with aspiration?)  Recommendations Recommended Consults: MBS;Consider GI evaluation Solid Consistency: Dysphagia 3 (Mechanical soft) Liquid Consistency: Thin Liquid Administration via: Cup;Straw Medication Administration: Whole meds with puree Supervision: Patient able to self feed Compensations: Follow solids with liquid;Slow rate Postural Changes and/or Swallow Maneuvers: Seated upright 90 degrees Oral Care Recommendations: Patient independent with oral care  SLP Swallow Study Goals Patient will consume recommended diet without observed clinical signs of aspiration with: Independent assistance Patient will utilize recommended strategies during swallow to increase swallowing safety with: Independent assistance  Prognosis Prognosis for Safe Diet Advancement: Good Barriers to Reach Goals:  (none)  Individuals Consulted Consulted and Agree with Results and Recommendations: Patient;Family member/caregiver;RN Family Member Consulted:  (spoke with wife)  Swallow Study Goals  SLP Swallow Study Goals Patient will consume recommended diet without observed clinical signs of aspiration with: Independent assistance Patient will utilize recommended strategies during swallow to increase swallowing safety with: Independent assistance  Meda Klinefelter 02/11/2011,5:16  PM

## 2011-02-12 LAB — URINE CULTURE: Culture: NO GROWTH

## 2011-02-12 LAB — BASIC METABOLIC PANEL
BUN: 6 mg/dL (ref 6–23)
Chloride: 102 mEq/L (ref 96–112)
Creatinine, Ser: 0.7 mg/dL (ref 0.50–1.35)
Glucose, Bld: 94 mg/dL (ref 70–99)
Potassium: 3.7 mEq/L (ref 3.5–5.1)

## 2011-02-12 LAB — CBC
HCT: 28.7 % — ABNORMAL LOW (ref 39.0–52.0)
Hemoglobin: 9.3 g/dL — ABNORMAL LOW (ref 13.0–17.0)
MCH: 27.6 pg (ref 26.0–34.0)
MCHC: 32.4 g/dL (ref 30.0–36.0)
MCV: 85.2 fL (ref 78.0–100.0)

## 2011-02-12 MED ORDER — BOOST / RESOURCE BREEZE PO LIQD
1.0000 | Freq: Two times a day (BID) | ORAL | Status: DC
  Administered 2011-02-12 – 2011-02-15 (×5): 1 via ORAL
  Filled 2011-02-12 (×10): qty 1

## 2011-02-12 MED ORDER — PRO-STAT SUGAR FREE PO LIQD
30.0000 mL | Freq: Three times a day (TID) | ORAL | Status: DC
  Administered 2011-02-12 – 2011-02-15 (×3): 30 mL via ORAL
  Filled 2011-02-12 (×5): qty 30

## 2011-02-12 NOTE — Progress Notes (Signed)
Pt ambulated to BR with assist from walker and RN. Pt stated he felt well. Called for assistance after finishing in BR. Pt stated he felt "sick" and that his "stomach hurts." When asked to rate his pain pt stated it was 8/10 and that "it hurts."  Pt stated he felt dizzy. Assisted back to bed. BP 97/69 HR 108. Zofran 4mg  IV administered d/t c/o nausea. Pt stated he felt slightly better after laying back in bed. Bed alarm returned to on position. Pt encouraged to call when needed. Will continue to monitor. Dagoberto Ligas, RN

## 2011-02-12 NOTE — Consult Note (Signed)
ANTIBIOTIC CONSULT NOTE   Pharmacy Consult for vancomycin and zosyn Indication: rule out pneumonia  Allergies  Allergen Reactions  . Sulfa Antibiotics Rash   Patient Measurements: Height: 5\' 10"  (177.8 cm) Weight: 142 lb 13.7 oz (64.8 kg) IBW/kg (Calculated) : 73   Vital Signs: Temp: 97.9 F (36.6 C) (09/15 0624) Temp src: Oral (09/15 0624) BP: 147/79 mmHg (09/15 0624) Pulse Rate: 69  (09/15 0624) Intake/Output from previous day: 09/14 0701 - 09/15 0700 In: 3594 [P.O.:480; I.V.:2749; IV Piggyback:365] Out: 1275 [Urine:1275] Intake/Output from this shift:    Labs:  [facility] 02/12/11 0651 02/11/11 0419 02/10/11 1541  WBC 6.0 6.7 8.4  HGB 9.3* 9.0* 9.3*  PLT 302 324 320  LABCREA -- -- --  CREATININE 0.70 0.81 0.85  CRCLEARANCE -- -- --    Basename 02/12/11 0652  VANCOTROUGH 17.8  VANCOPEAK --  VANCORANDOM --  GENTTROUGH --  GENTPEAK --  GENTRANDOM --  TOBRATROUGH --  TOBRAPEAK --  TOBRARND --  AMIKACINPEAK --  AMIKACINTROU --  AMIKACIN --    Microbiology: Recent Results (from the past 720 hour(s))  CULTURE, SPUTUM-ASSESSMENT     Status: Normal   Collection Time   02/11/11  5:30 AM      Component Value Range Status Comment   Specimen Description SPUTUM   Final    Special Requests Normal   Final    Sputum evaluation     Final    Value: MICROSCOPIC FINDINGS SUGGEST THAT THIS SPECIMEN IS NOT REPRESENTATIVE OF LOWER RESPIRATORY SECRETIONS. PLEASE RECOLLECT.   Report Status 02/11/2011 FINAL   Final     Medical History: Past Medical History  Diagnosis Date  . Hypertension   . Diverticular disease   . GERD (gastroesophageal reflux disease)   . Coronary artery disease   . Kidney stone   . Hypothyroidism    Medications:  Scheduled:     . aspirin  81 mg Oral Daily  . docusate sodium  100 mg Oral BID  . enoxaparin  40 mg Subcutaneous Q24H  . ferrous sulfate  325 mg Oral BID  . guaiFENesin  600 mg Oral BID  . levothyroxine  62.5 mcg Oral QAC  breakfast  . piperacillin-tazobactam (ZOSYN)  IV  3.375 g Intravenous Q8H  . vancomycin  1,000 mg Intravenous Q12H   Assessment: Renal fxn OK Trough on target  Goal of Therapy:  Vancomycin trough level 15-20 mcg/ml  Plan: Vancomycin 1gm q12hrs (continue)  Zosyn 3.375gm q8hrs (continue) Monitor renal fxn  Valrie Hart A 02/12/2011,8:13 AM

## 2011-02-12 NOTE — Progress Notes (Signed)
Subjective: Feels well. Felt a little dizzy and nauseous earlier today while ambulating down the hallway.  Objective: Vital signs in last 24 hours: Temp:  [97.8 F (36.6 C)-98 F (36.7 C)] 97.8 F (36.6 C) (09/15 0819) Pulse Rate:  [69-108] 71  (09/15 0926) Resp:  [18-20] 20  (09/15 0926) BP: (97-160)/(69-88) 120/71 mmHg (09/15 0926) SpO2:  [96 %-99 %] 96 % (09/15 0926) Weight change:  Last BM Date: 02/12/11  Intake/Output from previous day: 09/14 0701 - 09/15 0700 In: 3594 [P.O.:480; I.V.:2749; IV Piggyback:365] Out: 1275 [Urine:1275] Total I/O In: 120 [P.O.:120] Out: 200 [Urine:200]   Physical Exam: General: Alert, awake, oriented x3, in no acute distress. HEENT: No bruits, no goiter. Heart: Regular rate and rhythm, without murmurs, rubs, gallops. Lungs: Bibasilar crackles and rhonchi, no wheezing. Abdomen: Soft, nontender, nondistended, positive bowel sounds. Extremities: No clubbing cyanosis or edema with positive pedal pulses. Neuro: Grossly intact, nonfocal.    Lab Results: Basic Metabolic Panel:  Basename 02/12/11 0651 02/11/11 0419  NA 138 140  K 3.7 3.8  CL 102 104  CO2 28 28  GLUCOSE 94 92  BUN 6 8  CREATININE 0.70 0.81  CALCIUM 8.7 8.8  MG -- --  PHOS -- --   Liver Function Tests: No results found for this basename: AST:2,ALT:2,ALKPHOS:2,BILITOT:2,PROT:2,ALBUMIN:2 in the last 72 hours No results found for this basename: LIPASE:2,AMYLASE:2 in the last 72 hours No results found for this basename: AMMONIA:2 in the last 72 hours CBC:  Basename 02/12/11 0651 02/11/11 0419 02/10/11 1541  WBC 6.0 6.7 --  NEUTROABS -- -- 6.6  HGB 9.3* 9.0* --  HCT 28.7* 27.8* --  MCV 85.2 86.1 --  PLT 302 324 --   Cardiac Enzymes: No results found for this basename: CKTOTAL:3,CKMB:3,CKMBINDEX:3,TROPONINI:3 in the last 72 hours BNP:  Basename 02/10/11 1541  POCBNP 1046.0*   D-Dimer: No results found for this basename: DDIMER:2 in the last 72 hours CBG: No  results found for this basename: GLUCAP:6 in the last 72 hours Hemoglobin A1C: No results found for this basename: HGBA1C in the last 72 hours Fasting Lipid Panel: No results found for this basename: CHOL,HDL,LDLCALC,TRIG,CHOLHDL,LDLDIRECT in the last 72 hours Thyroid Function Tests:  [facility] 02/10/11 1541  TSH 4.360  T4TOTAL --  FREET4 --  T3FREE --  THYROIDAB --   Anemia Panel:  Basename 02/11/11 0450  VITAMINB12 398  FOLATE 15.1  FERRITIN 430*  TIBC 150*  IRON 18*  RETICCTPCT 1.3   Urine Drug Screen:  Alcohol Level: No results found for this basename: ETH:2 in the last 72 hours Urinalysis:  Misc. Labs:  Recent Results (from the past 240 hour(s))  CULTURE, SPUTUM-ASSESSMENT     Status: Normal   Collection Time   02/11/11  5:30 AM      Component Value Range Status Comment   Specimen Description SPUTUM   Final    Special Requests Normal   Final    Sputum evaluation     Final    Value: MICROSCOPIC FINDINGS SUGGEST THAT THIS SPECIMEN IS NOT REPRESENTATIVE OF LOWER RESPIRATORY SECRETIONS. PLEASE RECOLLECT.   Report Status 02/11/2011 FINAL   Final     Studies/Results: Dg Chest 2 View  02/10/2011  *RADIOLOGY REPORT*  Clinical Data: Rule out infiltrate  CHEST - 2 VIEW  Comparison: 02/08/2011  Findings: Cardiomediastinal silhouette is stable.  Central bronchitic changes again noted.  Patchy basilar infiltrate again noted.  There is patchy infiltrate in the right upper lobe.  No pulmonary edema.  IMPRESSION:  Patchy infiltrates right upper lobe and bilateral basilar.  No pulmonary edema.  Original Report Authenticated By: Natasha Mead, M.D.    Medications: Scheduled Meds:    . aspirin  81 mg Oral Daily  . docusate sodium  100 mg Oral BID  . enoxaparin  40 mg Subcutaneous Q24H  . ferrous sulfate  325 mg Oral BID  . guaiFENesin  600 mg Oral BID  . levothyroxine  62.5 mcg Oral QAC breakfast  . piperacillin-tazobactam (ZOSYN)  IV  3.375 g Intravenous Q8H  . vancomycin   1,000 mg Intravenous Q12H   Continuous Infusions:    . sodium chloride 75 mL/hr at 02/12/11 0536   PRN Meds:.acetaminophen, alum & mag hydroxide-simeth, bisacodyl, ondansetron (ZOFRAN) IV, ondansetron, senna-docusate, traMADol  EGD/Colonoscopy 2007: FINAL DIAGNOSIS:  1. Erosive reflux esophagitis with a small sliding hiatal hernia.  2. Small polyp involving the second part of the duodenum which did not  appear to be at the ampulla.  3. It was biopsied for histology. If this is an adenoma, he would need to  return for a polypectomy with the duodenoscope.  4. Pan colonic diverticulosis. He has a moderate number of diverticula at  the sigmoid colon and a few more proximal in the proximal colon.  5. A 2 x 5 mm polyp ablated via cold biopsy from transfer transverse  colon.    Assessment/Plan:   1.  *PNA (pneumonia): I agree with vancomycin and Zosyn to cover potential hospital-acquired pathogens. I also believe that the pneumonia has a strong component of aspiration. Speech therapy has been consulted to perform a modified barium swallow. 2.  Dysphagia: Continue with dysphagia 3 diet pending speech therapy recommendations. 3.  Anemia: Hemoglobin is 9. His FOBT is positive. Please see above for EGD/colonoscopy results from 2007. He did have erosive reflux esophagitis which could be contributing to his anemia and heme-positive stools. We'll make sure he is on a proton pump inhibitor. I will check an anemia panel and consider reconsulting GI. It appears Dr. Karilyn Cota has seen him in the past. 4.  Weakness generalized: PT evaluation deems no need for skilled PT following discharge. 5.  S/P hip replacement right 6.  Pain, joint, hip, right  7. DVT prophylaxis: Lovenox.    LOS: 2 days   HERNANDEZ ACOSTA,ESTELA 02/12/2011, 12:37 PM

## 2011-02-12 NOTE — Progress Notes (Signed)
INITIAL ADULT NUTRITION ASSESSMENT Date: 02/12/2011   Time: 2:30 PM Reason for Assessment: Poor appetite, FTT  ASSESSMENT: Male 77 y.o.  Dx: PNA (pneumonia)  Past Medical History  Diagnosis Date  . Hypertension   . Diverticular disease   . GERD (gastroesophageal reflux disease)   . Coronary artery disease   . Kidney stone   . Hypothyroidism     Scheduled Meds:   . aspirin  81 mg Oral Daily  . docusate sodium  100 mg Oral BID  . enoxaparin  40 mg Subcutaneous Q24H  . ferrous sulfate  325 mg Oral BID  . guaiFENesin  600 mg Oral BID  . levothyroxine  62.5 mcg Oral QAC breakfast  . piperacillin-tazobactam (ZOSYN)  IV  3.375 g Intravenous Q8H  . vancomycin  1,000 mg Intravenous Q12H   Continuous Infusions:   . sodium chloride 75 mL/hr at 02/12/11 0536   PRN Meds:.acetaminophen, alum & mag hydroxide-simeth, bisacodyl, ondansetron (ZOFRAN) IV, ondansetron, senna-docusate, traMADol  Ht: 5\' 10"  (177.8 cm)  Wt: 142 lb 13.7 oz (64.8 kg)  Ideal Wt: 68.5 kg 73 kg (161#) % Ideal Wt: 89%  Usual Wt: 145-150# % Usual Wt: 98%  Body mass index is 20.50 kg/(m^2).  Food/Nutrition Related Hx: Pt reports decr appetite since hip replacement August 6th. Also has had recent swallow difficulty. Bedside swallow study completed and MBSS pending. He says that his nausea is better currently.  He is tol Dys 3 diet with thin liquids. Reports to have eaten ~50% of his lunch but appetite still not at it's usual level. He denies significant wt loss; states  that the most he has ever weighed is 150#. He reports to be unable to tol oral suppl such as Ensure but is amiable to try [employer] and ProStat to assist with meeting nutritional needs given his poor appetite. I suspect he is at least moderately malnourished even though he denies significant wt loss.  BMET    Component Value Date/Time   NA 138 02/12/2011 0651   K 3.7 02/12/2011 0651   CL 102 02/12/2011 0651   CO2 28 02/12/2011 0651   GLUCOSE 94 02/12/2011 0651   BUN 6 02/12/2011 0651   CREATININE 0.70 02/12/2011 0651   CALCIUM 8.7 02/12/2011 0651   GFRNONAA >60 02/12/2011 0651   GFRAA >60 02/12/2011 0651   CBC    Component Value Date/Time   WBC 6.0 02/12/2011 0651   RBC 3.37* 02/12/2011 0651   HGB 9.3* 02/12/2011 0651   HCT 28.7* 02/12/2011 0651   PLT 302 02/12/2011 0651   MCV 85.2 02/12/2011 0651   MCH 27.6 02/12/2011 0651   MCHC 32.4 02/12/2011 0651   RDW 14.9 02/12/2011 0651   LYMPHSABS 1.0 02/10/2011 1541   MONOABS 0.7 02/10/2011 1541   EOSABS 0.1 02/10/2011 1541   BASOSABS 0.0 02/10/2011 1541     I/O last 3 completed shifts: In: 4863 [P.O.:840; I.V.:3608; IV Piggyback:415] Out: 1825 [Urine:1825]  Diet Order: Dysphagia-3 thin liquids   Supplements/Tube Feeding:none at this time  IVF:    sodium chloride Last Rate: 75 mL/hr at 02/12/11 0536    Estimated Nutritional Needs:   Kcal:1950-2145 Protein:72-85 grams Fluid:2.0-2.2 L/d  NUTRITION DIAGNOSIS: -Inadequate oral intake (NI-2.1).  Status: Ongoing  RELATED TO:  -decreased appetite  AS EVIDENCE BY:  -Pt report and observed meal intake 0-50% po's, FTT  MONITORING/EVALUATION(Goals): -Pt will improve oral intake to meet >75% of est energy and protein needs. -He will maintain current wt ~143#. -Monitor po's, labs,  wts and results of MBSS  EDUCATION NEEDS: -Education not appropriate at this time  INTERVENTION: -Add Resource Breeze BID between meals -ProStat 30 ml TID between meals  Dietitian [phone]  DOCUMENTATION CODES Per approved criteria  -Non-severe (moderate) malnutrition in the context of acute illness or injury    Francene Boyers 02/12/2011, 2:30 PM

## 2011-02-13 ENCOUNTER — Inpatient Hospital Stay (HOSPITAL_COMMUNITY): Payer: Medicare Other

## 2011-02-13 DIAGNOSIS — D509 Iron deficiency anemia, unspecified: Secondary | ICD-10-CM

## 2011-02-13 DIAGNOSIS — E876 Hypokalemia: Secondary | ICD-10-CM

## 2011-02-13 DIAGNOSIS — K921 Melena: Secondary | ICD-10-CM

## 2011-02-13 DIAGNOSIS — R1310 Dysphagia, unspecified: Secondary | ICD-10-CM

## 2011-02-13 DIAGNOSIS — E43 Unspecified severe protein-calorie malnutrition: Secondary | ICD-10-CM | POA: Diagnosis present

## 2011-02-13 LAB — BASIC METABOLIC PANEL
BUN: 9 mg/dL (ref 6–23)
CO2: 27 mEq/L (ref 19–32)
Calcium: 8.5 mg/dL (ref 8.4–10.5)
Creatinine, Ser: 1 mg/dL (ref 0.50–1.35)
Glucose, Bld: 92 mg/dL (ref 70–99)

## 2011-02-13 LAB — CBC
MCH: 27.8 pg (ref 26.0–34.0)
MCHC: 32.1 g/dL (ref 30.0–36.0)
MCV: 86.7 fL (ref 78.0–100.0)
Platelets: 328 10*3/uL (ref 150–400)
RBC: 3.16 MIL/uL — ABNORMAL LOW (ref 4.22–5.81)
RDW: 15.1 % (ref 11.5–15.5)

## 2011-02-13 MED ORDER — SODIUM CHLORIDE 0.9 % IJ SOLN
INTRAMUSCULAR | Status: AC
  Administered 2011-02-13: 10 mL
  Filled 2011-02-13: qty 10

## 2011-02-13 MED ORDER — POTASSIUM CHLORIDE 10 MEQ/100ML IV SOLN
10.0000 meq | INTRAVENOUS | Status: AC
  Administered 2011-02-13 (×3): 10 meq via INTRAVENOUS
  Filled 2011-02-13 (×2): qty 100

## 2011-02-13 MED ORDER — KCL IN DEXTROSE-NACL 20-5-0.45 MEQ/L-%-% IV SOLN
INTRAVENOUS | Status: DC
  Administered 2011-02-13: 1000 mL via INTRAVENOUS
  Administered 2011-02-14 – 2011-02-20 (×11): via INTRAVENOUS

## 2011-02-13 MED ORDER — METOCLOPRAMIDE HCL 5 MG/ML IJ SOLN
10.0000 mg | Freq: Once | INTRAMUSCULAR | Status: AC
  Administered 2011-02-13: 10 mg via INTRAVENOUS
  Filled 2011-02-13: qty 2

## 2011-02-13 MED ORDER — SODIUM CHLORIDE 0.9 % IJ SOLN
INTRAMUSCULAR | Status: AC
  Administered 2011-02-13: 10 mL
  Filled 2011-02-13: qty 3

## 2011-02-13 MED ORDER — PANTOPRAZOLE SODIUM 40 MG IV SOLR
40.0000 mg | INTRAVENOUS | Status: DC
  Administered 2011-02-13 – 2011-02-19 (×7): 40 mg via INTRAVENOUS
  Filled 2011-02-13 (×7): qty 40

## 2011-02-13 MED ORDER — POTASSIUM CHLORIDE 10 MEQ/100ML IV SOLN
INTRAVENOUS | Status: AC
  Administered 2011-02-13: 17:00:00
  Filled 2011-02-13: qty 100

## 2011-02-13 NOTE — Progress Notes (Signed)
Subjective: Reviewed chart. Saw and examined patient at bedside. Pleasant but lethargic male admitted with generalized weakness, cough, dysphagia, weight loss. He attributes weight loss and dysphagia to his hip surgery more than a month ago. Patient anemic, with heme positive stools, he has apparently aspiration pna, spits continuously, feels week. No prior egd, last colonoscopy years ago, by dr Isaiah Serge.  Objective: Vital signs in last 24 hours: Temp:  [97.8 F (36.6 C)-98.1 F (36.7 C)] 97.9 F (36.6 C) (09/16 0538) Pulse Rate:  [65-78] 75  (09/16 0538) Resp:  [18-20] 20  (09/16 0538) BP: (100-128)/(62-71) 128/71 mmHg (09/16 0538) SpO2:  [92 %-97 %] 97 % (09/16 0538) Weight change:  Last BM Date: 02/12/11  Intake/Output from previous day: 09/15 0701 - 09/16 0700 In: 2183.8 [P.O.:480; I.V.:1253.8; IV Piggyback:450] Out: 800 [Urine:800] Intake/Output this shift: Total I/O In: 120 [P.O.:120] Out: -   P/E- lethargic, pale. Weak voice Heent- no oral thrush. RS- clear phlegm in bedside receiver. CVS- S1,S2 heard, no murmurs. Abdomen- scaphoid, non tender. No masses. +BS. CNS- grossly intact. Extremities- No pedal edema. Muscle wasting.  Lab Results:  Basename 02/13/11 0506 02/12/11 0651  WBC 5.7 6.0  HGB 8.8* 9.3*  HCT 27.4* 28.7*  PLT 328 302   BMET  Basename 02/13/11 0506 02/12/11 0651  NA 135 138  K 3.5 3.7  CL 101 102  CO2 27 28  GLUCOSE 92 94  BUN 9 6  CREATININE 1.00 0.70  CALCIUM 8.5 8.7    Studies/Results: No results found.  Medications: medication reviewed.  Assessment/Plan: 1. HCAP vs Aspiration PNA- picture vs aspiration, given problems swallowing. Afebrile. Will repeat CXR, Continue vanc/zosyn to complete 7 days. Pursue swallow eval. 2. Dysphagia/Likely acute on chronic blood loss anemia of a gi source/weight loss/spitting- concern for upper gi malignancy/stricture. Consulted Dr Isaiah Serge. He will graciously see patient for possible EGD first. Will d/c  anticoagulants, give ppi. Transfuse 2 units prbc for target hb 10 or greater, given lethargy. Continue IVF for now. 3. Severe protein calorie malnutrition secondary to poor oral intake. Continue nutritional support. 4. Hypokalemia- secondary to poor oral intake. Replenish. 5. Deconditioning- pt/snf. 6. DVT prophylaxis- SCDs.  LOS: 3 days   Bobbijo Holst 02/13/2011, 11:27 AM

## 2011-02-13 NOTE — Consult Note (Signed)
NAME:  Jorge Nixon, HOLUB NO.:  [id number]  MEDICAL RECORD NO.:  [id number]  LOCATION:  A309                          FACILITY:  [facility]  PHYSICIAN:  Jorge Nixon, M.D.    DATE OF BIRTH:  1924-12-11  DATE OF CONSULTATION:  02/13/2011 DATE OF DISCHARGE:                                CONSULTATION   REASON FOR CONSULTATION:  Anemia, heme-positive stools and dysphagia.  HISTORY OF PRESENT ILLNESS:  Jorge Nixon is an 77 year old Caucasian male, soon to be 77, who was admitted to this facility 3 days ago with progressive weakness and productive cough.  He was diagnosed with bilateral pneumonia and he is responding to antibiotic.  He apparently has been treated for pneumonia twice recently.  The patient is getting second unit of PRBCs.  The patient complains of dysphagia since he had his right hip replacement which was on January 03, 2011.  He points to suprasternal area site of bolus obstruction.  He states he has no difficulty with soft foods, but primarily with bread, meat and hard vegetables.  He is  eating small boluses and eventually able to push it down.  He has not had any episode of impaction and/or regurgitation.  He has not had any problems with heartburn for the last few years.  He therefore has not been on any medication.  He has history of GERD which is reviewed under past medical history.  Yesterday, he has some periumbilical pain but it resolved with pain medication.  He states his stools are black since he has been on iron therapy.  He denies rectal bleeding or diarrhea.  He states his bowels have been moving fairly easily since he has been on a laxative.  The patient states he was doing quite well when he was discharged from [facility] about 2 weeks ago following rehab after his hip surgery. However, a few days prior to admission he felt that his hemoglobin was low since he did not have any energy and could not do simple tasks at home. He says his appetite is  not good.  He has lost 10 pounds since his surgery.  He states his cough has improved, although he still brings clear thick sputum.  He denies chest pain.  At home, he has been on; 1. Lovenox 40 mg subcu q.12. 2. Aspirin 81 mg daily. 3. Tylenol 500 mg q.6 p.r.n. 4. Ferrous sulfate 325 mg twice daily. 5. Neurontin 100 mg once daily. 6. Levaquin 500 mg daily. 7. Synthroid 125 mcg daily. 8. Tramadol 50 mg daily p.r.n.  He was also on losartan and hydrochlorothiazide, but this was recently discontinued.  CURRENT MEDICATIONS: 1. Colace 2 tablets daily. 2. Pro-Stat sugar free 30 mL 3 times a day (nutritional supplement). 3. Resource Breeze 1 container twice daily. 4. Mucinex 600 mg p.o. every 12 hours. 5. Synthroid 62.5 mcg p.o. daily. 6. Pantoprazole 40 mg IV q.24 h. 7. Zosyn 3.375 g every 8 hours IV q.8 h. 8. Vancomycin 1 g every 12 hours IV. 9. He is also receiving his IV fluids.  P.R.N. MEDICATIONS:  Acetaminophen, Maalox, Dulcolax, Zofran, and tramadol.  PAST MEDICAL HISTORY:  He has  history of hypertension, however, his blood pressures have been normal off therapy.  Hypothyroidism was diagnosed about 4 years ago.  He had TURP for BPH in 1997.  He had cystoscopy with ureteral stenting for right urolithiasis by Dr. Rito Nixon in June 2006.  He had stent removed a month later.  He had benign lesion removed from his right little finger in July 2003.  He had left knee replacement in March 2006.  He had right knee arthroscopy in May 2010.  He had right hip replacement on January 03, 2011 for severe DJD and pain.  Chronic GERD diagnosed on EGD in September 2007 revealing erosive esophagitis.  He also had a duodenal polyp biopsy which was benign revealing peptic changes, but no adenoma.  A colonoscopy at the same time revealed an colonic diverticulosis and single polyp which was not an adenoma.  ALLERGIES:  SULFA causing rash.  FAMILY HISTORY:  Father died of MI at age 22 and  mother lived to be 55. He has a sister living who is 57 and doing reasonably well.  Another sister died at age 34.  SOCIAL HISTORY:  He is married.  He has one son living.  Two children were deceased.  Both daughter and son committed suicide while there were in their 47s (8 years apart).  He worked at [employer], [employer]   but his last occupation was at  [employer] where he worked for 15 years prior to his retirement.  He has never smoked cigarettes or drank alcohol.  PHYSICAL EXAMINATION:  VITAL SIGNS:  He weighs 141 pounds.  He is 70 inches tall.  His BMI is 20.3.  Pulse is 75 per minute, blood pressure 123/80, and temperature is 98.1. HEENT:  Conjunctivae is pale.  Sclera is nonicteric.  Oral pharyngeal mucosa is normal.  He has partial low plates. NECK:  No neck masses or thyromegaly noted. CARDIAC:  Regular rhythm, normal S1 and S2.  No murmur or gallop noted. LUNGS:  Clear to auscultation. ABDOMEN:  Flat.  Bowel sounds are normal.  No bruits noted.  On palpation soft abdomen with mild periumbilical tenderness.  No organomegaly or masses noted. RECTAL:  Deferred. EXTREMITIES:  Thin, but no clubbing or cholinergic noted.  Lab data from February 11, 2011, WBC 6.7, H and H 9 and 27.8, platelet count 324 k, MCV 86.1.  Serum iron 18, TIBC 150, saturation 12%, present ferritin 430, folate 15.1, B12 level 398.  Sodium 140, potassium 3.8, chloride 104, CO2 28, BUN 8, creatinine 0.81, calcium 8.8.  His INR was 1.26.  Hemoccult, the first was positive.  The second one is negative.  H and H from this morning is 8.8 and 27.4.  The patient has not had LFTs, but he is getting comprehensive chemistry panel in a.m.  Chest film on admission revealed bilateral infiltrates.  ASSESSMENT:  Jorge Nixon is an 77 year old Caucasian male who is undergoing therapy for pneumonia and seemed to be improving.  This appears to be second episode.  He was also found to be anemic.  He is receiving  second unit of PRBCs.  His stool is guaiac positive.  His serum iron is slow, but so is the TIBC and saturation with normal to high ferritin. Therefore, his anemia would appear to be multifactorial but since his stool is heme-positive and he is having dysphagia needs to be further evaluated.  The patient underwent right hip replacement on January 03, 2011 and I suspect he has been spending more time in  the wheelchair and in bed and therefore at risk for reflux esophagitis.  The patient's last colonoscopy was 5 years ago and he had [facility] colonic diverticulosis in single small polyp which was not an adenoma.  RECOMMENDATIONS:  We will proceed with esophagogastroduodenoscopy and possible dilation in a.m..  As discussed with Dr. Venetia Constable, his Lovenox will be held tonight, to be resumed in a.m.  Further recommendations to follow.  We appreciate the opportunity to participate in the care of this gentleman.          ______________________________ Jorge Nixon, M.D.     NR/MEDQ  D:  02/13/2011  T:  02/13/2011  Job:  [id number]

## 2011-02-13 NOTE — Consult Note (Signed)
[id number]

## 2011-02-14 ENCOUNTER — Encounter (HOSPITAL_COMMUNITY): Payer: Self-pay | Admitting: *Deleted

## 2011-02-14 ENCOUNTER — Encounter (HOSPITAL_COMMUNITY)
Admission: EM | Disposition: A | Discharge status: Home Health | Payer: Self-pay | Source: Home / Self Care | Attending: Family Medicine

## 2011-02-14 DIAGNOSIS — K449 Diaphragmatic hernia without obstruction or gangrene: Secondary | ICD-10-CM

## 2011-02-14 DIAGNOSIS — K5790 Diverticulosis of intestine, part unspecified, without perforation or abscess without bleeding: Secondary | ICD-10-CM

## 2011-02-14 DIAGNOSIS — R1310 Dysphagia, unspecified: Secondary | ICD-10-CM

## 2011-02-14 DIAGNOSIS — R195 Other fecal abnormalities: Secondary | ICD-10-CM

## 2011-02-14 DIAGNOSIS — D649 Anemia, unspecified: Secondary | ICD-10-CM

## 2011-02-14 DIAGNOSIS — K222 Esophageal obstruction: Secondary | ICD-10-CM

## 2011-02-14 DIAGNOSIS — K2970 Gastritis, unspecified, without bleeding: Secondary | ICD-10-CM

## 2011-02-14 DIAGNOSIS — K2990 Gastroduodenitis, unspecified, without bleeding: Secondary | ICD-10-CM

## 2011-02-14 HISTORY — DX: Diverticulosis of intestine, part unspecified, without perforation or abscess without bleeding: K57.90

## 2011-02-14 LAB — CBC
Hemoglobin: 11 g/dL — ABNORMAL LOW (ref 13.0–17.0)
MCH: 27.2 pg (ref 26.0–34.0)
MCHC: 33 g/dL (ref 30.0–36.0)
MCV: 82.4 fL (ref 78.0–100.0)
RBC: 4.04 MIL/uL — ABNORMAL LOW (ref 4.22–5.81)

## 2011-02-14 LAB — TYPE AND SCREEN
ABO/RH(D): A NEG
Antibody Screen: NEGATIVE
Unit division: 0
Unit division: 0

## 2011-02-14 LAB — MAGNESIUM: Magnesium: 2.3 mg/dL (ref 1.5–2.5)

## 2011-02-14 LAB — COMPREHENSIVE METABOLIC PANEL
BUN: 10 mg/dL (ref 6–23)
CO2: 25 mEq/L (ref 19–32)
Calcium: 8.6 mg/dL (ref 8.4–10.5)
Creatinine, Ser: 1.09 mg/dL (ref 0.50–1.35)
GFR calc Af Amer: >60 mL/min (ref >60)
GFR calc non Af Amer: >60 mL/min (ref >60)
Glucose, Bld: 98 mg/dL (ref 70–99)

## 2011-02-14 LAB — PROTIME-INR
INR: 1.25 (ref 0.00–1.49)
Prothrombin Time: 16 seconds — ABNORMAL HIGH (ref 11.6–15.2)

## 2011-02-14 LAB — EXPECTORATED SPUTUM ASSESSMENT W GRAM STAIN, RFLX TO RESP C

## 2011-02-14 SURGERY — ESOPHAGOGASTRODUODENOSCOPY (EGD) WITH ESOPHAGEAL DILATION
Anesthesia: Moderate Sedation

## 2011-02-14 MED ORDER — PHENOL 1.4 % MT LIQD
1.0000 | OROMUCOSAL | Status: DC | PRN
  Administered 2011-02-19: 1 via OROMUCOSAL
  Filled 2011-02-14: qty 177

## 2011-02-14 MED ORDER — ENOXAPARIN SODIUM 30 MG/0.3ML ~~LOC~~ SOLN: 30.0000 mg | SUBCUTANEOUS | Status: DC

## 2011-02-14 MED ORDER — MIDAZOLAM HCL 5 MG/5ML IJ SOLN
INTRAMUSCULAR | Status: AC
  Filled 2011-02-14: qty 10

## 2011-02-14 MED ORDER — SODIUM CHLORIDE 0.45 % IV SOLN
Freq: Once | INTRAVENOUS | Status: AC
  Administered 2011-02-14: 08:00:00 via INTRAVENOUS

## 2011-02-14 MED ORDER — POTASSIUM CHLORIDE 20 MEQ/15ML (10%) PO LIQD
40.0000 meq | Freq: Once | ORAL | Status: AC
  Administered 2011-02-14: 40 meq via ORAL
  Filled 2011-02-14: qty 30

## 2011-02-14 MED ORDER — MEPERIDINE HCL 25 MG/ML IJ SOLN
INTRAMUSCULAR | Status: DC | PRN
  Administered 2011-02-14: 10 mg via INTRAVENOUS

## 2011-02-14 MED ORDER — MEPERIDINE HCL 50 MG/ML IJ SOLN
INTRAMUSCULAR | Status: AC
Start: 2011-02-14 — End: 2011-02-14
  Filled 2011-02-14: qty 1

## 2011-02-14 MED ORDER — MIDAZOLAM HCL 5 MG/5ML IJ SOLN
INTRAMUSCULAR | Status: DC | PRN
  Administered 2011-02-14 (×3): 1 mg via INTRAVENOUS

## 2011-02-14 MED ORDER — BUTAMBEN-TETRACAINE-BENZOCAINE 2-2-14 % EX AERO
INHALATION_SPRAY | CUTANEOUS | Status: DC | PRN
  Administered 2011-02-14: 2 via TOPICAL

## 2011-02-14 NOTE — Op Note (Signed)
EGD PROCEDURE REPORT  PATIENT:  Jorge Nixon  MR#:  [id number] Birthdate:  Nov 22, 1924, 77 y.o., male Endoscopist:  Dr. Malissa Hippo, MD Referred By:  Dr. Osie Cheeks, MD Procedure Date: 02/14/2011  Procedure:   EGD with ED.  Indications:  Patient is 77 year old Caucasian male who has dysphagia to solids anemia and heme positive stools. He is currently undergoing therapy for her pneumonia and much improved with therapy.            Informed Consent:  Procedure and risks were reviewed with the patient and informed signed was obtained. Medications:  Demerol 10 mg IV Versed 3 mg IV Cetacaine spray topically for oropharyngeal anesthesia  Description of procedure:  The endoscope was introduced through the mouth and advanced to the second portion of the duodenum without difficulty or limitations. The mucosal surfaces were surveyed very carefully during advancement of the scope and upon withdrawal.  Findings:  Esophagus:  Normal esophageal mucosa. No ring or stricture noted GEJ:  39 cm Hiatus:  41 cm Stomach:  Stomach was empty and distended very well with insufflation. Folds in the proximal stomach were normal. Examination of mucosa at body antrum pyloric channel as well as angularis fundus and cardia was normal. Duodenum:  A Cozaar of the bulb revealed patchy erythema and edema no erosions or ulcers were noted. Large duodenal diverticulum involving the second part of the duodenum and ampulla located in its wall.  Therapeutic/Diagnostic Maneuvers Performed:  Esophageal dilation performed by passing 54 French Maloney dilator to full insertion. Esophageal mucosa examined post dilation and there was mucosal disruption at cervical esophagus indicative of a web.  Complications:  None  Impression: Normal esophageal mucosa but dilation with 54 French Maloney dilator resulted in mucosal disruption at proximal esophagus indicative of an esophageal web 2 cm size sliding-type hernia. Bulbar  duodenitis. Large duodenal diverticulum involving second part of the duodenum.  Recommendations:  Continue pantoprazole. H. pylori serology. Can resume Lovenox this evening,.  REHMAN,NAJEEB U  02/14/2011  9:20 AM  CC: Dr. Dwana Melena, MD & Dr. Bonnetta Barry ref. provider found

## 2011-02-14 NOTE — Progress Notes (Signed)
Pt was seen for BSE on Friday with recommendation for MBS vs EGD/Barium Swallow. Acknowledge pt had EGD completed this am with Dr. Karilyn Cota and report reviewed. Given esophageal web with dilatation this am, MBSS may not be indicated if pt has a relief in symptoms. Will follow up with pt during/after a meal and discuss with MD. Thank you, Havery Moros, CCC-SLP [phone]

## 2011-02-14 NOTE — Progress Notes (Signed)
Speech Language/Pathology  I spoke with pt at bedside after lunch. He states that his throat is sore from EGD this morning, but that he thinks his swallowing is better. SLP will follow up with patient tomorrow to determine whether additional SLP services are indicated. Discussed with Dr. Venetia Constable who is in agreement.  Thank you, Havery Moros, CCC-SLP

## 2011-02-14 NOTE — Progress Notes (Signed)
Subjective: Appreciate Dr Karilyn Cota input. Patient feels better today. He complaints of soreness in the throat. He would rather not take Lovenox, but is ok with SCDs. He believes he can manage at home once the pneumonia has been adequately treated.  Objective: Vital signs in last 24 hours: Temp:  [97.1 F (36.2 C)-98.7 F (37.1 C)] 98.1 F (36.7 C) (09/17 0741) Pulse Rate:  [63-90] 75  (09/17 0920) Resp:  [17-23] 22  (09/17 0920) BP: (115-160)/(53-92) 137/76 mmHg (09/17 0920) SpO2:  [96 %-100 %] 97 % (09/17 0920) Weight change:  Last BM Date: 02/12/11  Intake/Output from previous day: 09/16 0701 - 09/17 0700 In: 4017.8 [P.O.:360; I.V.:1870.8; Blood:625; IV Piggyback:1162] Out: 1250 [Urine:1250] Intake/Output this shift: Total I/O In: -  Out: 300 [Urine:300]  Seems a little more energized today. HEENT- unchanged from yesterday. RS- good air entry bilaterally. CVS- S1S2.RRR.CNS- lethargic, otherwise grossly intact. Extremities- No pedal edema.  Lab Results:  [facility] 02/14/11 0443 02/13/11 0506  WBC 6.9 5.7  HGB 11.0* 8.8*  HCT 33.3* 27.4*  PLT 310 328   BMET  Basename 02/14/11 0443 02/13/11 0506  NA 139 135  K 3.3* 3.5  CL 105 101  CO2 25 27  GLUCOSE 98 92  BUN 10 9  CREATININE 1.09 1.00  CALCIUM 8.6 8.5    Studies/Results: Dg Chest Portable 1 View  02/13/2011  *RADIOLOGY REPORT*  Clinical Data: Pneumonia, anemia, weakness, productive cough  PORTABLE CHEST - 1 VIEW  Comparison: 02/10/2011; 02/08/2011; 01/07/2011  Findings: Grossly unchanged cardiac silhouette and mediastinal contours.  Grossly unchanged patchy heterogeneous opacities within the right mid/upper lung, and medial aspect of the bilateral lung bases, left greater than right.  No new focal airspace opacities. A skin fold overlies the peripheral aspect of the right mid lung. No pneumothorax.  There is persistent minimal blunting of the right costophrenic angle without definite pleural effusion.  Grossly  unchanged bones.  IMPRESSION:  Grossly unchanged bilateral heterogeneous opacities worrisome for multifocal infection.  Follow-up to resolution is recommended.  Original Report Authenticated By: Waynard Reeds, M.D.    Medications: medication reviewed  Assessment/Plan: 1. HCAP vs Aspiration pneumonia, with concern for anerobic infection- Making small strides. Continue current antibiotics. 2. Acute on chronic blood loss anemia, likely of Gi loss, in setting of esophageal web/esophagitis, s/p prbc transfusion, s/p egd today- Dr Karilyn Cota assistance greatly appreciated. Appropriate increase in Hb, post transfusion. Continue ppi, follow h. Pylori test. 3. Deconditioning/ Severe protein calorie malnutrition following recent acute hip fracture- continue nutritional support/PT. Patient prefers to go home once PNa treated. 4. Hypokalemia related to poor oral intake- replenish. 5. DVTprophylaxis- SCDs  LOS: 4 days   Sharlynn Seckinger 02/14/2011, 2:37 PM

## 2011-02-15 ENCOUNTER — Inpatient Hospital Stay (HOSPITAL_COMMUNITY): Payer: Medicare Other

## 2011-02-15 DIAGNOSIS — I2510 Atherosclerotic heart disease of native coronary artery without angina pectoris: Secondary | ICD-10-CM

## 2011-02-15 DIAGNOSIS — K222 Esophageal obstruction: Secondary | ICD-10-CM

## 2011-02-15 LAB — BASIC METABOLIC PANEL
CO2: 24 mEq/L (ref 19–32)
Calcium: 8.5 mg/dL (ref 8.4–10.5)
Creatinine, Ser: 2.06 mg/dL — ABNORMAL HIGH (ref 0.50–1.35)

## 2011-02-15 LAB — CBC
MCH: 27.6 pg (ref 26.0–34.0)
MCHC: 33.2 g/dL (ref 30.0–36.0)
MCV: 82.9 fL (ref 78.0–100.0)
Platelets: 285 10*3/uL (ref 150–400)
RBC: 4.1 MIL/uL — ABNORMAL LOW (ref 4.22–5.81)
RDW: 16.4 % — ABNORMAL HIGH (ref 11.5–15.5)

## 2011-02-15 LAB — CARDIAC PANEL(CRET KIN+CKTOT+MB+TROPI)
Relative Index: INVALID (ref 0.0–2.5)
Troponin I: <0.3 ng/mL (ref <0.30)

## 2011-02-15 LAB — PRO B NATRIURETIC PEPTIDE: Pro B Natriuretic peptide (BNP): 6945 pg/mL — ABNORMAL HIGH (ref 0–450)

## 2011-02-15 MED ORDER — POTASSIUM CHLORIDE 20 MEQ/15ML (10%) PO LIQD
20.0000 meq | Freq: Every day | ORAL | Status: DC
  Administered 2011-02-15 – 2011-02-18 (×4): 20 meq via ORAL
  Filled 2011-02-15 (×4): qty 30

## 2011-02-15 MED ORDER — IPRATROPIUM BROMIDE 0.02 % IN SOLN
0.5000 mg | RESPIRATORY_TRACT | Status: DC
  Administered 2011-02-15 – 2011-02-16 (×5): 0.5 mg via RESPIRATORY_TRACT
  Filled 2011-02-15 (×5): qty 2.5

## 2011-02-15 MED ORDER — DEXAMETHASONE SODIUM PHOSPHATE 4 MG/ML IJ SOLN
4.0000 mg | Freq: Once | INTRAMUSCULAR | Status: AC
  Administered 2011-02-15: 4 mg via INTRAVENOUS
  Filled 2011-02-15: qty 1

## 2011-02-15 MED ORDER — POTASSIUM CHLORIDE 10 MEQ/100ML IV SOLN
10.0000 meq | INTRAVENOUS | Status: AC
  Administered 2011-02-15 (×2): 10 meq via INTRAVENOUS
  Filled 2011-02-15: qty 200

## 2011-02-15 MED ORDER — CLINDAMYCIN PHOSPHATE 600 MG/50ML IV SOLN
600.0000 mg | Freq: Four times a day (QID) | INTRAVENOUS | Status: AC
  Administered 2011-02-15 – 2011-02-16 (×3): 600 mg via INTRAVENOUS
  Filled 2011-02-15 (×5): qty 50

## 2011-02-15 MED ORDER — ALBUTEROL SULFATE (5 MG/ML) 0.5% IN NEBU
2.5000 mg | INHALATION_SOLUTION | RESPIRATORY_TRACT | Status: DC
  Administered 2011-02-15 – 2011-02-16 (×5): 2.5 mg via RESPIRATORY_TRACT
  Filled 2011-02-15 (×5): qty 0.5

## 2011-02-15 MED ORDER — SODIUM CHLORIDE 0.9 % IJ SOLN
INTRAMUSCULAR | Status: AC
  Filled 2011-02-15: qty 10

## 2011-02-15 MED ORDER — FUROSEMIDE 20 MG PO TABS
20.0000 mg | ORAL_TABLET | Freq: Every day | ORAL | Status: DC
  Administered 2011-02-15 – 2011-02-16 (×2): 20 mg via ORAL
  Filled 2011-02-15 (×2): qty 1

## 2011-02-15 MED ORDER — MOXIFLOXACIN HCL IN NACL 400 MG/250ML IV SOLN
400.0000 mg | INTRAVENOUS | Status: DC
  Administered 2011-02-15 – 2011-02-18 (×4): 400 mg via INTRAVENOUS
  Filled 2011-02-15 (×4): qty 250

## 2011-02-15 NOTE — Progress Notes (Signed)
Physical Therapy Evaluation Patient Name: BRONSON BRESSMAN [id number] Date: 02/15/2011 Problem List:  Patient Active Problem List  Diagnoses  . PNA (pneumonia)  . Dysphagia  . Anemia  . Weakness generalized  . S/P hip replacement right  . Pain, joint, hip, right  . Hypokalemia  . Protein-calorie malnutrition, severe   Past Medical History:  Past Medical History  Diagnosis Date  . Hypertension   . Diverticular disease 02/14/2011    problems swallowing  . GERD (gastroesophageal reflux disease)   . Coronary artery disease   . Kidney stone   . Hypothyroidism   . Pneumonia   . Anemia    Past Surgical History:  Past Surgical History  Procedure Date  . Total hip arthroplasty august 6th 2012    right side   . Replacement total knee 2006    left knee  . Transurethral resection of prostate 1994 or 1995    Precautions/Restrictions    Prior Functioning  Home Living Type of Home: House Lives With: Spouse Receives Help From: Family Home Layout: One level Home Access: Stairs to enter Entrance Stairs-Rails: Right Entrance Stairs-Number of Steps: 4 Bathroom Shower/Tub: Tub/shower unit Home Adaptive Equipment: Straight cane;Walker - rolling Prior Function Level of Independence: Independent with basic ADLs Driving: Yes Vocation: Retired Financial risk analyst Arousal/Alertness: Awake/alert Overall Cognitive Status: Appears within functional limits for tasks assessed Orientation Level: Oriented X4 Sensation/Coordination Sensation Light Touch: Appears Intact Coordination Gross Motor Movements are Fluid and Coordinated: Yes Fine Motor Movements are Fluid and Coordinated: Not tested Extremity Assessment   Mobility (including Balance) Bed Mobility Bed Mobility: Yes Rolling Left: 6: Modified independent (Device/Increase time) Right Sidelying to Sit: 5: Supervision Left Sidelying to Sit: 5: Supervision Supine to Sit: 5: Supervision Sitting - Scoot to Edge of Bed: 6: Modified  independent (Device/Increase time) Transfers Transfers: Yes Sit to Stand: 5: Supervision Sit to Stand Details (indicate cue type and reason): verbal cue to scoot to edge and push from bed do not pull from walker Stand to Sit: 5: Supervision Stand Pivot Transfers: 6: Modified independent (Device/Increase time) Ambulation/Gait Ambulation/Gait: Yes Ambulation/Gait Assistance: 6: Modified independent (Device/Increase time) Ambulation Distance (Feet): 40 Feet (x-ray came to get patient) Assistive device: Rolling walker Gait Pattern: Decreased step length - right;Decreased step length - left Gait velocity: slow Stairs: No    Exercise  Total Joint Exercises Ankle Circles/Pumps: AROM;Strengthening;10 reps;Supine Quad Sets: Strengthening;Both;10 reps;Supine Gluteal Sets: Both;10 reps;Supine Hip ABduction/ADduction: AROM;Strengthening;Both;10 reps General Exercises - Lower Extremity Ankle Circles/Pumps: AROM;Strengthening;10 reps;Supine Quad Sets: Strengthening;Both;10 reps;Supine Gluteal Sets: Both;10 reps;Supine Hip ABduction/ADduction: AROM;Strengthening;Both;10 reps Hip Flexion/Marching: AROM;Both;10 reps Low Level/ICU Exercises Ankle Circles/Pumps: AROM;Strengthening;10 reps;Supine Quad Sets: Strengthening;Both;10 reps;Supine Hip ABduction/ADduction: AROM;Strengthening;Both;10 reps  End of Session PT - End of Session Equipment Utilized During Treatment: Gait belt (rolling walker) Activity Tolerance: Patient tolerated treatment well Patient left: Other (comment) (taken to x-ray) Nurse Communication: Mobility status for transfers General Behavior During Session: [facility] for tasks performed Cognition: [facility] for tasks performed PT Assessment/Plan/Recommendation PT Assessment Clinical Impression Statement: Pt who has decreased in strengrth over the past four days will be seen by PT to increase safety of ambulation and strength of LE PT Recommendation/Assessment: Patient will need skilled  PT in the acute care venue PT Problem List: Decreased strength PT Therapy Diagnosis : Generalized weakness PT Plan PT Frequency: Min 3X/week PT Treatment/Interventions: Gait training;Therapeutic exercise PT Recommendation Follow Up Recommendations: Home health PT Equipment Recommended: None recommended by PT PT Goals  Acute Rehab PT Goals PT Goal Formulation:  With patient Time For Goal Achievement: 4 days Pt will go Supine/Side to Sit: with modified independence Pt will go Sit to Supine/Side: with modified independence Pt will Ambulate: >150 feet;with modified independence Pt will Go Up / Down Stairs: 3-5 stairs;with supervision Floyd Wade,CINDY 02/15/2011, 3:19 PM

## 2011-02-15 NOTE — Progress Notes (Signed)
Subjective: Jorge Nixon is a sweet 77 year old male admitted last week with cough, low grade fever, associated with generalis ed weakness and some ? Dark colored stool. He was found to have bilateral PNA thought to be HCAP vs Aspiration PNA. He was hence placed on Vanc/Zosyn till today. However, he seems to be making little progress in terms of general debility. According to his wife, Jorge Nixon was in good health till he had right hip replacement on august 6th this year. Prior to this, the wife says he was very active, other the the hip constraint. The wife states that he has lost a huge amount of weight, he has had problems swallowing and lost appetite, and she is worried that he is getting worse. The patient was noted to be anemic, with gradual decline in the hemoglobin, hence transfusion of 2 units PRBC on 02/13/11, with an appropriate response. He was also graciously seen in consult by Jorge Nixon for heme positive stool. He went ahead and performed EGD on 02/14/11 with findings of esophageal web and duodenal diverticulosis. He sent sample for H.pylori testing, result pending. Despite at least 5 days of antibiotics, the patient remains quite lethargic, with developing AKI. CXR on 02/13/11 did not show improvement. The question at present is the cause of this general decline which has developed apparently since the hip replacement last month. If this is the case, the debility could be attributed to sequelae of surgery- ? Just aspiration pneumonitis/HCAP/Dysphagia resulting in severe malnutrition/Acute on chronic blood loss anemia especially with prolonged course of lovenox postop/MI  VS some ongoing problem like occult malignancy/Multiple myeloma unmasked by surgery?. Due to the worsening renal failure, I will d/c vanc/zosyn for now, as renal failure maybe a more complicated problem for this patient. I will put her on Avelox/Clindamycin for now, get CT chest/2deco/cardiac enzymes/SPEP/UPEP , replenish potassium. Patient's  wife wanted me to call Jorge Nixon, which I did but his office was closed? For lunch, I may try again before day end. Today patient still complaints of feeling weak. Objective: Vital signs in last 24 hours: Temp:  [98.4 F (36.9 C)-98.6 F (37 C)] 98.4 F (36.9 C) (09/18 0541) Pulse Rate:  [73-77] 77  (09/18 0541) Resp:  [20] 20  (09/18 0541) BP: (135-151)/(76) 151/76 mmHg (09/18 0541) SpO2:  [96 %] 96 % (09/18 0541) Weight change:  Last BM Date: 02/14/11  Intake/Output from previous day: 09/17 0701 - 09/18 0700 In: 1096.7 [I.V.:546.7; IV Piggyback:550] Out: 751 [Urine:750; Stool:1] Intake/Output this shift:   PE Lethargic, awake. HEENT- no thrush. No JVD. RS- bilateral air entry. Some rhonchi in the bases. CVS- S1S2. RRR. No murmurs. Abdomen- soft, non tender. +BS. CNS- lethargic but alert oriented times 3. Extremities No pedal edema.  Lab Results:  Basename 02/15/11 0505 02/14/11 0443  WBC 9.7 6.9  HGB 11.3* 11.0*  HCT 34.0* 33.3*  PLT 285 310   BMET  Basename 02/15/11 0505 02/14/11 0443  NA 140 139  K 3.3* 3.3*  CL 104 105  CO2 24 25  GLUCOSE 104* 98  BUN 13 10  CREATININE 2.06* 1.09  CALCIUM 8.5 8.6    Studies/Results: No results found.  Medications: medication reviewed  Assessment/Plan: 1. HCAP vs aspiration PNA, possibly complicated/multilobar- will obtain Ct chest to better assess lung parenchyma. Change Abx to Levaquin/clindamycin. 2decho/cardiac enzymes/probnp. 2. Dysphagia/Esophageal web- contniue ppi, dysphagia diet, nutritional support. 3. Acute on chronic blood loss anemia s/p 2 units prbc transfusion with appropriate response. Check SPEP/UPEP/PSA. 4.  Hypothyroidism on synthroid. 5. AKI- likely antibiotic related, monitor. Check urine spot lytes. 6. Deconditioning- may need SNF again. 7. Condition very closely guarded.  LOS: 5 days   Jorge Nixon 02/15/2011, 2:01 PM

## 2011-02-15 NOTE — Progress Notes (Signed)
*  PRELIMINARY RESULTS* Echocardiogram 2D Echocardiogram has been performed.  Jorge Nixon 02/15/2011, 3:52 PM

## 2011-02-15 NOTE — Progress Notes (Signed)
Subjective; patient states he does not feel well. Does not have any appetite. His sore throat is less than it was yesterday. He is still coughing up clear sputum. Earlier in the day he experienced midabdominal pain but this has resolved. He denies diarrhea or dysuria. He states he had a normal stool earlier today. His antibiotics have been changed to clindamycin and Moxie Floxin. Objective; BP 151/76  Pulse 77  Temp(Src) 98.4 F (36.9 C) (Oral)  Resp 20  Ht 5\' 10"  (1.778 m)  Wt 142 lb 13.7 oz (64.8 kg)  BMI 20.50 kg/m2  SpO2 96% Patient does not appear to be in respiratory distress. His abdomen is flat and soft he has mild hypogastric tenderness which is felt to be due to  distended bladder. Lab data; H. pylori serology  is negative. WBC 9.7 hemoglobin 11.3, hematocrit 34.0, Serum potassium is 3.3. His creatinine is 2.06 which is gone up in the last 24 hours. Patient is receiving IV fluids at 50 ml/hr. Assessment; #1. dysphagia; he is status post esophageal dilation yesterday for esophageal valve. He still has some discomfort which should resolve over the next few days; if his dysphagia does not improve he will need further evaluation. #2. Anemia; no evidence of active bleeding. He has received 2 units of PRBCs his H&H is stable. He will need colonoscopy down the road possibly few months for now. #3. Failure to thrive; is being treated for pneumonia his antibiotic therapy has been changed. #4. His creatinine has increased. Will monitor oral intake he may need to either rate of IV fluids. Patient is to have his CBC and be met repeated in a.m. long with the bunch of other labs ordered by Dr. Venetia Constable.

## 2011-02-16 DIAGNOSIS — N179 Acute kidney failure, unspecified: Secondary | ICD-10-CM | POA: Diagnosis not present

## 2011-02-16 LAB — COMPREHENSIVE METABOLIC PANEL
ALT: 7 U/L (ref 0–53)
Albumin: 2.8 g/dL — ABNORMAL LOW (ref 3.5–5.2)
Alkaline Phosphatase: 43 U/L (ref 39–117)
BUN: 16 mg/dL (ref 6–23)
Chloride: 105 mEq/L (ref 96–112)
Glucose, Bld: 147 mg/dL — ABNORMAL HIGH (ref 70–99)
Potassium: 3.8 mEq/L (ref 3.5–5.1)
Sodium: 140 mEq/L (ref 135–145)
Total Bilirubin: 0.3 mg/dL (ref 0.3–1.2)

## 2011-02-16 LAB — CBC
HCT: 35.2 % — ABNORMAL LOW (ref 39.0–52.0)
Hemoglobin: 11.4 g/dL — ABNORMAL LOW (ref 13.0–17.0)
MCH: 27.1 pg (ref 26.0–34.0)
MCHC: 32.4 g/dL (ref 30.0–36.0)
MCV: 83.8 fL (ref 78.0–100.0)
Platelets: 317 10*3/uL (ref 150–400)
RBC: 4.2 MIL/uL — ABNORMAL LOW (ref 4.22–5.81)
RDW: 16.5 % — ABNORMAL HIGH (ref 11.5–15.5)
WBC: 8.6 10*3/uL (ref 4.0–10.5)

## 2011-02-16 LAB — CREATININE, URINE, RANDOM: Creatinine, Urine: 95.5 mg/dL

## 2011-02-16 LAB — CORTISOL-AM, BLOOD: Cortisol - AM: 1.9 ug/dL — ABNORMAL LOW (ref 4.3–22.4)

## 2011-02-16 LAB — EXPECTORATED SPUTUM ASSESSMENT W GRAM STAIN, RFLX TO RESP C

## 2011-02-16 LAB — SODIUM, URINE, RANDOM: Sodium, Ur: 33 mEq/L

## 2011-02-16 LAB — ANA: Anti Nuclear Antibody(ANA): NEGATIVE

## 2011-02-16 LAB — PSA: PSA: 1.13 ng/mL (ref <=4.00)

## 2011-02-16 LAB — CEA: CEA: 0.7 ng/mL (ref 0.0–5.0)

## 2011-02-16 MED ORDER — COSYNTROPIN 0.25 MG IJ SOLR
0.2500 mg | Freq: Once | INTRAMUSCULAR | Status: AC
  Administered 2011-02-17: 0.25 mg via INTRAVENOUS
  Filled 2011-02-16: qty 0.25

## 2011-02-16 NOTE — Progress Notes (Signed)
Subjective; patient has multiple complaints; is been having intermittent periumbilical pain. He's had 2 bowel movements stools soft; at home at times he has to take a laxative; says his sore throat is much better but he still does not have an appetite; he has no difficulty voiding. He states he is coughing up less sputum now. Objective; BP 119/70  Pulse 93  Temp(Src) 98.2 F (36.8 C) (Oral)  Resp 18  Ht 5\' 10"  (1.778 m)  Wt 142 lb 13.7 oz (64.8 kg)  BMI 20.50 kg/m2  SpO2 97% He does not appear to be in any distress. Abdomen is symmetrical. I'll sounds are normal. Abdomen is soft with mild that he umbilicus tenderness without guarding or rebound. Extremities thin but no LE edema noted. Lab data; WBC 8.6, hemoglobin 11.4, hematocrit 35.2, platelet count 317k Serum sodium 140, potassium 3.8, chloride 105, CO2 22, glucose 147, BUN 16, creatinine 2.57 Bilirubin 0.3, AP 43, AST 16, ALT 7, albumin 2.8, calcium 8.8 Fasting cortisol level is 1.9. Assessment; Patient has multiple issues; #1. Anemia with heme positive stools but no evidence of active bleeding; he has received 2 units of PRBCs and his H&H is stable n;o  lesion found on EGD to account for blood loss. Colonoscopy deferred on account of other issues. #2. Pneumonia; he is improving with antibiotic therapy. #3 dysphagia; status post esophageal dilation; remains to be seen whether or not ED would help. #4. Rising serum creatinine level; and concerned he may be the dry side; nevertheless  nephrology consultation has been requested. #5. Very low cortisol levels. May have Addison's disease. #6. Mid abdominal pain. Abdominal exam is unremarkable. He has been on the antibiotic therapy off and on for the last few weeks C. difficile colitis would be concern for he's not had diarrhea. Recommendations; ACTH stimulation test ordered for a.m. check fasting cortisol  and ACTH level followed by Cortrosyn injection repeat cortisol level in 60  minutes. Abdominopelvic CT with oral contrast only in a.m. Stool C. difficile by PCR.

## 2011-02-16 NOTE — Consult Note (Signed)
Reason for Consult: Worsening of renal failure Referring Physician: See the hospitalist group   Jorge Nixon is an 77 y.o. male.  HPI: The patient her her as her her renal failure which seems to be worsening and. Presently I went to   including  Who was  With him.. After I stated my name and then had explained to them why  I was consulted. Hhis wife asked me multiple questions and I answered all their question theirr main complaint was he been seen by multiple physicians and alsoblood has been drawn multiple times a day. Presently patient does want the blood to be drawn because he assumes is anemia was 2 multiple blood samples taken everyday.. And the I explained to him why a check her history work including to see how his kidney is  functioning and also will do ultrasound of the kidney and also check his urine. Her after I spent 20 minutes explaining since her I couldn't convince patient  excuse myself and the left the room . I have discussed with him also that her how to explain her to his primary care physician the hospitalist about the situation and probably the we'll come and explained to you for the general condition. Since has this is the first time to see as the patient her couldn't answer also complains he has which includes his previous surgery nursing home and after he came to the hospital here for pneumonia.[name] at this moment I am not able to examine or get enough history to do a consult. Possibly if his condition deteriorates, patient may need to be sent to [facility] to be seen by other nephrology group.  Past Medical History  Diagnosis Date  . Hypertension   . Diverticular disease 02/14/2011    problems swallowing  . GERD (gastroesophageal reflux disease)   . Coronary artery disease   . Kidney stone   . Hypothyroidism   . Pneumonia   . Anemia     Past Surgical History  Procedure Date  . Total hip arthroplasty august 6th 2012    right side   . Replacement total knee 2006    left  knee  . Transurethral resection of prostate 1994 or 1995    History reviewed. No pertinent family history.  Social History:  reports that he has never smoked. He does not have any smokeless tobacco history on file. He reports that he does not drink alcohol or use illicit drugs.  Allergies:  Allergies  Allergen Reactions  . Sulfa Antibiotics Rash    Medications: I have reviewed the patient's current medications.  Results for orders placed during the hospital encounter of 02/10/11 (from the past 48 hour(s))  CBC     Status: Abnormal   Collection Time   02/15/11  5:05 AM      Component Value Range Comment   WBC 9.7  4.0 - 10.5 (K/uL)    RBC 4.10 (*) 4.22 - 5.81 (MIL/uL)    Hemoglobin 11.3 (*) 13.0 - 17.0 (g/dL)    HCT 16.1 (*) 09.6 - 52.0 (%)    MCV 82.9  78.0 - 100.0 (fL)    MCH 27.6  26.0 - 34.0 (pg)    MCHC 33.2  30.0 - 36.0 (g/dL)    RDW 04.5 (*) 40.9 - 15.5 (%)    Platelets 285  150 - 400 (K/uL)   BASIC METABOLIC PANEL     Status: Abnormal   Collection Time   02/15/11  5:05 AM  Component Value Range Comment   Sodium 140  135 - 145 (mEq/L)    Potassium 3.3 (*) 3.5 - 5.1 (mEq/L)    Chloride 104  96 - 112 (mEq/L)    CO2 24  19 - 32 (mEq/L)    Glucose, Bld 104 (*) 70 - 99 (mg/dL)    BUN 13  6 - 23 (mg/dL)    Creatinine, Ser 4.09 (*) 0.50 - 1.35 (mg/dL) DELTA CHECK NOTED   Calcium 8.5  8.4 - 10.5 (mg/dL)    GFR calc non Af Amer 31 (*) >60 (mL/min)    GFR calc Af Amer 37 (*) >60 (mL/min)   PSA     Status: Normal   Collection Time   02/15/11  4:05 PM      Component Value Range Comment   PSA 1.13  <=4.00 (ng/mL) Test Methodology: ECLIA PSA (Electrochemiluminescence Immunoassay)  CEA     Status: Normal   Collection Time   02/15/11  4:05 PM      Component Value Range Comment   CEA 0.7  0.0 - 5.0 (ng/mL)   CANCER ANTIGEN 19-9     Status: Abnormal   Collection Time   02/15/11  4:05 PM      Component Value Range Comment   CA 19-9 9.1 (*) <35.0 (U/mL)   ANA     Status:  Normal   Collection Time   02/15/11  4:05 PM      Component Value Range Comment   ANA NEGATIVE  NEGATIVE    PRO B NATRIURETIC PEPTIDE     Status: Abnormal   Collection Time   02/15/11  4:05 PM      Component Value Range Comment   BNP, POC 6945.0 (*) 0 - 450 (pg/mL)   CARDIAC PANEL(CRET KIN+CKTOT+MB+TROPI)     Status: Abnormal   Collection Time   02/15/11  4:05 PM      Component Value Range Comment   Total CK 62  7 - 232 (U/L)    CK, MB 4.7 (*) 0.3 - 4.0 (ng/mL)    Troponin I <0.30  <0.30 (ng/mL)    Relative Index RELATIVE INDEX IS INVALID  0.0 - 2.5    CBC     Status: Abnormal   Collection Time   02/16/11  5:02 AM      Component Value Range Comment   WBC 8.6  4.0 - 10.5 (K/uL)    RBC 4.20 (*) 4.22 - 5.81 (MIL/uL)    Hemoglobin 11.4 (*) 13.0 - 17.0 (g/dL)    HCT 81.1 (*) 91.4 - 52.0 (%)    MCV 83.8  78.0 - 100.0 (fL)    MCH 27.1  26.0 - 34.0 (pg)    MCHC 32.4  30.0 - 36.0 (g/dL)    RDW 78.2 (*) 95.6 - 15.5 (%)    Platelets 317  150 - 400 (K/uL)   COMPREHENSIVE METABOLIC PANEL     Status: Abnormal   Collection Time   02/16/11  5:02 AM      Component Value Range Comment   Sodium 140  135 - 145 (mEq/L)    Potassium 3.8  3.5 - 5.1 (mEq/L)    Chloride 105  96 - 112 (mEq/L)    CO2 22  19 - 32 (mEq/L)    Glucose, Bld 147 (*) 70 - 99 (mg/dL)    BUN 16  6 - 23 (mg/dL)    Creatinine, Ser 2.13 (*) 0.50 - 1.35 (mg/dL)    Calcium 8.8  8.4 - 10.5 (mg/dL)    Total Protein 6.6  6.0 - 8.3 (g/dL)    Albumin 2.8 (*) 3.5 - 5.2 (g/dL)    AST 16  0 - 37 (U/L)    ALT 7  0 - 53 (U/L)    Alkaline Phosphatase 43  39 - 117 (U/L)    Total Bilirubin 0.3  0.3 - 1.2 (mg/dL)    GFR calc non Af Amer 24 (*) >60 (mL/min)    GFR calc Af Amer 29 (*) >60 (mL/min)   CORTISOL-AM, BLOOD     Status: Abnormal   Collection Time   02/16/11  5:02 AM      Component Value Range Comment   Cortisol - AM 1.9 (*) 4.3 - 22.4 (ug/dL)   CULTURE, SPUTUM-ASSESSMENT     Status: Normal   Collection Time   02/16/11  5:03 AM        Component Value Range Comment   Specimen Description SPUTUM EXPECTORATED      Special Requests NONE      Sputum evaluation        Value: THIS SPECIMEN IS ACCEPTABLE. RESPIRATORY CULTURE REPORT TO FOLLOW.     Performed at [facility]   Report Status 02/16/2011 FINAL     CREATININE, URINE, RANDOM     Status: Normal   Collection Time   02/16/11  2:26 PM      Component Value Range Comment   Creatinine, Urine 95.5     SODIUM, URINE, RANDOM     Status: Normal   Collection Time   02/16/11  2:26 PM      Component Value Range Comment   Sodium, Ur 33       Ct Chest Wo Contrast  02/15/2011  *RADIOLOGY REPORT*  Clinical Data: Pneumonia failing to improve, continued cough, low grade fever  CT CHEST WITHOUT CONTRAST  Technique:  Multidetector CT imaging of the chest was performed following the standard protocol without IV contrast. Sagittal and coronal MPR images reconstructed from axial data set.  IV contrast not utilized due to renal dysfunction  Comparison: 03/17/2008  Findings: Scattered atherosclerotic calcifications aorta and coronary arteries. Calcified granulomata and liver. Remaining visualized portions of upper abdomen unremarkable. Small bilateral pleural effusions. Scattered normal-sized mediastinal lymph nodes. Patchy areas of airspace infiltrate are identified bilateral upper lobes, lingula, and right middle lobe. Minimal infiltrate superior segment left lower lobe. Atelectasis and potentially infiltrate at base of left lower lobe. Increased pleural parenchymal opacity at the posterior aspect of the left apex, may represent infiltrate but underlying mass not completely excluded. Bones appear demineralized.  IMPRESSION: Patchy areas of consolidation are identified in the the right upper lobe, right middle lobe and lingula, with minimal infiltrate superior segment left lower lobe. More focal area of pleuroparenchymal opacity at posterior aspect of left upper lobe. Follow-up examinations  recommended until resolution to exclude mass lesions at the any of these sites. Small bilateral pleural effusions. Scattered atherosclerotic disease changes.  Original Report Authenticated By: Lollie Marrow, M.D.    ROS Blood pressure 119/70, pulse 93, temperature 98.2 F (36.8 C), temperature source Oral, resp. rate 18, height 5\' 10"  (1.778 m), weight 64.8 kg (142 lb 13.7 oz), SpO2 97.00%. Physical Exam  Assessment/Plan: Please see  history of present illness.  Deangela Randleman S 02/16/2011, 7:06 PM

## 2011-02-16 NOTE — Progress Notes (Signed)
Physical Therapy Treatment Patient Name: Jorge Nixon [id number] Date: 02/16/2011 Time In:  15:00 Time Out: 15:26 Charges: Therex x 14 Gait x 10 Problem List:  Patient Active Problem List  Diagnoses  . PNA (pneumonia)  . Dysphagia  . Anemia  . Weakness generalized  . S/P hip replacement right  . Pain, joint, hip, right  . Hypokalemia  . Protein-calorie malnutrition, severe   Past Medical History:  Past Medical History  Diagnosis Date  . Hypertension   . Diverticular disease 02/14/2011    problems swallowing  . GERD (gastroesophageal reflux disease)   . Coronary artery disease   . Kidney stone   . Hypothyroidism   . Pneumonia   . Anemia    Past Surgical History:  Past Surgical History  Procedure Date  . Total hip arthroplasty august 6th 2012    right side   . Replacement total knee 2006    left knee  . Transurethral resection of prostate 1994 or 1995   Precautions/Restrictions  Precautions Precautions: Anterior Hip Restrictions Weight Bearing Restrictions: No Mobility (including Balance) Bed Mobility Bed Mobility: Yes Supine to Sit: 6: Modified independent (Device/Increase time) Supine to Sit Details (indicate cue type and reason): Pt requires increased time for activity. Sitting - Scoot to Edge of Bed: 6: Modified independent (Device/Increase time) Sit to Supine - Right: 6: Modified independent (Device/Increase time) Transfers Transfers: Yes Sit to Stand: 5: Supervision Sit to Stand Details (indicate cue type and reason): Vc's to scoot to edge of bed. Stand to Sit: 5: Supervision Stand to Sit Details: VC's for controlled descent. Ambulation/Gait Ambulation/Gait: Yes Ambulation/Gait Assistance: 6: Modified independent (Device/Increase time) Ambulation Distance (Feet): 376 Feet Assistive device: Rolling walker Gait Pattern: Trunk flexed;Decreased stride length Gait velocity: Increased    Exercise  Total Joint Exercises Ankle Circles/Pumps: AAROM;10  reps;Supine;Both Quad Sets: Strengthening;Both;10 reps;Supine Short Arc Quad: Strengthening;Both;10 reps;Supine Heel Slides: Strengthening;10 reps;Supine;Both Straight Leg Raises: Strengthening;Both;10 reps;Supine General Exercises - Lower Extremity Ankle Circles/Pumps: AAROM;10 reps;Supine;Both Quad Sets: Strengthening;Both;10 reps;Supine Short Arc Quad: Strengthening;Both;10 reps;Supine Heel Slides: Strengthening;10 reps;Supine;Both Straight Leg Raises: Strengthening;Both;10 reps;Supine Low Level/ICU Exercises Ankle Circles/Pumps: AAROM;10 reps;Supine;Both Quad Sets: Strengthening;Both;10 reps;Supine Short Arc Quad: Strengthening;Both;10 reps;Supine Heel Slides: Strengthening;10 reps;Supine;Both  End of Session Equipment Utilized During Treatment  Gait belt   Activity Tolerance  Patient tolerated treatment well     Patient left  in bed; with call bell in reach General:   Behavior During Session  [facility] for tasks performed  Cognition  [facility] for tasks performed   PT Assessment/Plan  PT - Assessment/Plan PT Frequency:  (Continue w/PT POC.)  Antonieta Iba 02/16/2011, 6:03 PM

## 2011-02-16 NOTE — Progress Notes (Addendum)
Follow-up- ADULT NUTRITION ASSESSMENT Date: 02/16/2011   Time: 3:41 PM Reason for Assessment: Poor appetite, FTT  ASSESSMENT: Male 77 y.o.  Dx: PNA (pneumonia)  Past Medical History  Diagnosis Date  . Hypertension   . Diverticular disease 02/14/2011    problems swallowing  . GERD (gastroesophageal reflux disease)   . Coronary artery disease   . Kidney stone   . Hypothyroidism   . Pneumonia   . Anemia     Scheduled Meds:    . ipratropium  0.5 mg Nebulization Q4H   And  . albuterol  2.5 mg Nebulization Q4H  . clindamycin (CLEOCIN) IV  600 mg Intravenous Q6H  . dexamethasone  4 mg Intravenous Once  . docusate sodium  100 mg Oral BID  . furosemide  20 mg Oral Daily  . guaiFENesin  600 mg Oral BID  . levothyroxine  62.5 mcg Oral QAC breakfast  . moxifloxacin  400 mg Intravenous Q24H  . pantoprazole (PROTONIX) IV  40 mg Intravenous Q24H  . potassium chloride  10 mEq Intravenous Q1 Hr x 3  . potassium chloride  20 mEq Oral Daily  . sodium chloride      . sodium chloride      . DISCONTD: feeding supplement  30 mL Oral TID WC  . DISCONTD: feeding supplement  1 Container Oral BID   Continuous Infusions:    . dextrose 5 % and 0.45 % NaCl with KCl 20 mEq/L 20 mL/hr at 02/14/11 2330   PRN Meds:.acetaminophen, alum & mag hydroxide-simeth, bisacodyl, ondansetron (ZOFRAN) IV, ondansetron, phenol, senna-docusate, traMADol  Ht: 5\' 10"  (177.8 cm)  Wt: 142 lb 13.7 oz (64.8 kg)  Ideal Wt: 68.5 kg 73 kg (161#) % Ideal Wt: 89%  Usual Wt: 145-150# % Usual Wt: 98%  Body mass index is 20.50 kg/(m^2).  Food/Nutrition Related Hx: Pt reports decr appetite since hip replacement August 6th. Also has had recent swallow difficulty. Bedside swallow study completed and MBSS pending. He says that his nausea is better currently.  He is tol Dys 3 diet with thin liquids. Reports to have eaten ~50% of his lunch but appetite still not at it's usual level. He denies significant wt loss; states   that the most he has ever weighed is 150#. He reports to be unable to tol oral suppl such as Ensure but is amiable to try [employer] and ProStat to assist with meeting nutritional needs given his poor appetite. I suspect he is at least moderately malnourished even though he denies significant wt loss. 02-16-11 Pt says his stomach is hurting currently. He spouse is here and says that he's a "very picky" eater. He did walk earlier and ate 75% breakfast. At lunch he had jello, soup and juice. He doesn't like the [facility] suppl; will d/c. Inadequate oral intake cont. but he is willing to try to incr protein-type foods. Therefore, we'll send cottage cheese and soft fruit for snack in evening and double portion of eggs daily with breakfast.  BMET    Component Value Date/Time   NA 140 02/16/2011 0502   K 3.8 02/16/2011 0502   CL 105 02/16/2011 0502   CO2 22 02/16/2011 0502   GLUCOSE 147* 02/16/2011 0502   BUN 16 02/16/2011 0502   CREATININE 2.57* 02/16/2011 0502   CALCIUM 8.8 02/16/2011 0502   GFRNONAA 24* 02/16/2011 0502   GFRAA 29* 02/16/2011 0502   CBC    Component Value Date/Time   WBC 8.6 02/16/2011 0502   RBC 4.20*  02/16/2011 0502   HGB 11.4* 02/16/2011 0502   HCT 35.2* 02/16/2011 0502   PLT 317 02/16/2011 0502   MCV 83.8 02/16/2011 0502   MCH 27.1 02/16/2011 0502   MCHC 32.4 02/16/2011 0502   RDW 16.5* 02/16/2011 0502   LYMPHSABS 1.0 02/10/2011 1541   MONOABS 0.7 02/10/2011 1541   EOSABS 0.1 02/10/2011 1541   BASOSABS 0.0 02/10/2011 1541     I/O last 3 completed shifts: In: 1620 [I.V.:720; IV Piggyback:900] Out: 652 [Urine:650; Stool:2]  Diet Order: General-Regular diet with Honey-thick liquads  Supplements/Tube Feeding:none at this time  IVF:     dextrose 5 % and 0.45 % NaCl with KCl 20 mEq/L Last Rate: 20 mL/hr at 02/14/11 2330    Estimated Nutritional Needs:   Kcal:1950-2145 Protein:72-85 grams Fluid:2.0-2.2 L/d  NUTRITION DIAGNOSIS: -Inadequate oral intake (NI-2.1).  Status:  Ongoing  RELATED TO:  -decreased appetite  AS EVIDENCE BY:  -Pt report and observed meal intake 0-50% po's, FTT  MONITORING/EVALUATION(Goals): -Pt will improve oral intake to meet >75% of est energy and protein needs. -He will maintain current wt ~143#. -Monitor po's, labs, wts and results of MBSS  EDUCATION NEEDS: -Education not appropriate at this time  INTERVENTION: -Add Resource Breeze BID between meals -ProStat 30 ml TID between meals  02-16-11 -D/C Breeze BID; pt doesn't like the taste. -Add cottage cheeze and soft fruit daily and -Double portion eggs daily for increased protein.  Dietitian [phone]  DOCUMENTATION CODES Per approved criteria  -Non-severe (moderate) malnutrition in the context of acute illness or injury    Francene Boyers 02/16/2011, 3:41 PM

## 2011-02-16 NOTE — Progress Notes (Signed)
Subjective: Still with decreased by mouth intake, and loose stools x2 today Objective: Vital signs in last 24 hours: Filed Vitals:   02/16/11 0627 02/16/11 0736 02/16/11 1200 02/16/11 1500  BP: 122/65   119/70  Pulse: 67   93  Temp: 98 F (36.7 C)   98.2 F (36.8 C)  TempSrc:      Resp: 20   18  Height:      Weight:      SpO2: 97% 98% 97% 97%   Weight change:   Intake/Output Summary (Last 24 hours) at 02/16/11 1900 Last data filed at 02/16/11 1700  Gross per 24 hour  Intake   1840 ml  Output    301 ml  Net   1539 ml    General Appearance:    Alert, cooperative, no distress, appears stated age  Lungs:     decreased breath sounds at the bases, bilaterally, respirations unlabored   Heart:    Regular rate and rhythm, S1 and S2 normal, no murmur, rub   or gallop  Abdomen:     Soft, non-tender, bowel sounds active all four quadrants,    no masses, no organomegaly  Extremities:   Extremities normal, atraumatic, no cyanosis or edema  Neurologic:  nonfocal exam      Lab Results:  Micro Results: Recent Results (from the past 240 hour(s))  URINE CULTURE     Status: Normal   Collection Time   02/10/11  7:46 PM      Component Value Range Status Comment   Specimen Description URINE, CLEAN CATCH   Final    Special Requests NONE   Final    Setup Time [id number]   Final    Colony Count NO GROWTH   Final    Culture NO GROWTH   Final    Report Status 02/12/2011 FINAL   Final   CULTURE, SPUTUM-ASSESSMENT     Status: Normal   Collection Time   02/11/11  2:08 AM      Component Value Range Status Comment   Specimen Description SPU EXPECTORATED   Final    Special Requests NONE   Final    Sputum evaluation     Final    Value: MICROSCOPIC FINDINGS SUGGEST THAT THIS SPECIMEN IS NOT REPRESENTATIVE OF LOWER RESPIRATORY SECRETIONS. PLEASE RECOLLECT.     Results Called to: TRACY,RN AT 0428 ON 02/11/2011 BY MILES,A.     Performed at [facility]   Report Status 02/14/2011 FINAL    Final   CULTURE, SPUTUM-ASSESSMENT     Status: Normal   Collection Time   02/11/11  5:30 AM      Component Value Range Status Comment   Specimen Description SPUTUM   Final    Special Requests Normal   Final    Sputum evaluation     Final    Value: MICROSCOPIC FINDINGS SUGGEST THAT THIS SPECIMEN IS NOT REPRESENTATIVE OF LOWER RESPIRATORY SECRETIONS. PLEASE RECOLLECT.     Results Called to: MAVIS,J. AT 0915 ON 02/11/2011 BY RESSEGGER,R.    Report Status 02/11/2011 FINAL   Final   CULTURE, SPUTUM-ASSESSMENT     Status: Normal   Collection Time   02/16/11  5:03 AM      Component Value Range Status Comment   Specimen Description SPUTUM EXPECTORATED   Final    Special Requests NONE   Final    Sputum evaluation     Final    Value: THIS SPECIMEN IS ACCEPTABLE. RESPIRATORY CULTURE REPORT TO  FOLLOW.     Performed at [facility]   Report Status 02/16/2011 FINAL   Final    Studies/Results: Results for orders placed during the hospital encounter of 02/10/11 (from the past 24 hour(s))  CBC     Status: Abnormal   Collection Time   02/16/11  5:02 AM      Component Value Range   WBC 8.6  4.0 - 10.5 (K/uL)   RBC 4.20 (*) 4.22 - 5.81 (MIL/uL)   Hemoglobin 11.4 (*) 13.0 - 17.0 (g/dL)   HCT 16.1 (*) 09.6 - 52.0 (%)   MCV 83.8  78.0 - 100.0 (fL)   MCH 27.1  26.0 - 34.0 (pg)   MCHC 32.4  30.0 - 36.0 (g/dL)   RDW 04.5 (*) 40.9 - 15.5 (%)   Platelets 317  150 - 400 (K/uL)  COMPREHENSIVE METABOLIC PANEL     Status: Abnormal   Collection Time   02/16/11  5:02 AM      Component Value Range   Sodium 140  135 - 145 (mEq/L)   Potassium 3.8  3.5 - 5.1 (mEq/L)   Chloride 105  96 - 112 (mEq/L)   CO2 22  19 - 32 (mEq/L)   Glucose, Bld 147 (*) 70 - 99 (mg/dL)   BUN 16  6 - 23 (mg/dL)   Creatinine, Ser 8.11 (*) 0.50 - 1.35 (mg/dL)   Calcium 8.8  8.4 - 91.4 (mg/dL)   Total Protein 6.6  6.0 - 8.3 (g/dL)   Albumin 2.8 (*) 3.5 - 5.2 (g/dL)   AST 16  0 - 37 (U/L)   ALT 7  0 - 53 (U/L)   Alkaline  Phosphatase 43  39 - 117 (U/L)   Total Bilirubin 0.3  0.3 - 1.2 (mg/dL)   GFR calc non Af Amer 24 (*) >60 (mL/min)   GFR calc Af Amer 29 (*) >60 (mL/min)  CORTISOL-AM, BLOOD     Status: Abnormal   Collection Time   02/16/11  5:02 AM      Component Value Range   Cortisol - AM 1.9 (*) 4.3 - 22.4 (ug/dL)  CULTURE, SPUTUM-ASSESSMENT     Status: Normal   Collection Time   02/16/11  5:03 AM      Component Value Range   Specimen Description SPUTUM EXPECTORATED     Special Requests NONE     Sputum evaluation       Value: THIS SPECIMEN IS ACCEPTABLE. RESPIRATORY CULTURE REPORT TO FOLLOW.     Performed at [facility]   Report Status 02/16/2011 FINAL    CREATININE, URINE, RANDOM     Status: Normal   Collection Time   02/16/11  2:26 PM      Component Value Range   Creatinine, Urine 95.5    SODIUM, URINE, RANDOM     Status: Normal   Collection Time   02/16/11  2:26 PM      Component Value Range   Sodium, Ur 33      Ct Chest Wo Contrast  02/15/2011  *RADIOLOGY REPORT*  Clinical Data: Pneumonia failing to improve, continued cough, low grade fever  CT CHEST WITHOUT CONTRAST  Technique:  Multidetector CT imaging of the chest was performed following the standard protocol without IV contrast. Sagittal and coronal MPR images reconstructed from axial data set.  IV contrast not utilized due to renal dysfunction  Comparison: 03/17/2008  Findings: Scattered atherosclerotic calcifications aorta and coronary arteries. Calcified granulomata and liver. Remaining visualized portions of upper  abdomen unremarkable. Small bilateral pleural effusions. Scattered normal-sized mediastinal lymph nodes. Patchy areas of airspace infiltrate are identified bilateral upper lobes, lingula, and right middle lobe. Minimal infiltrate superior segment left lower lobe. Atelectasis and potentially infiltrate at base of left lower lobe. Increased pleural parenchymal opacity at the posterior aspect of the left apex, may represent  infiltrate but underlying mass not completely excluded. Bones appear demineralized.  IMPRESSION: Patchy areas of consolidation are identified in the the right upper lobe, right middle lobe and lingula, with minimal infiltrate superior segment left lower lobe. More focal area of pleuroparenchymal opacity at posterior aspect of left upper lobe. Follow-up examinations recommended until resolution to exclude mass lesions at the any of these sites. Small bilateral pleural effusions. Scattered atherosclerotic disease changes.  Original Report Authenticated By: Lollie Marrow, M.D.   Medications:  Scheduled Meds:   . ipratropium  0.5 mg Nebulization Q4H   And  . albuterol  2.5 mg Nebulization Q4H  . clindamycin (CLEOCIN) IV  600 mg Intravenous Q6H  . cosyntropin  0.25 mg Intravenous Once  . docusate sodium  100 mg Oral BID  . furosemide  20 mg Oral Daily  . guaiFENesin  600 mg Oral BID  . levothyroxine  62.5 mcg Oral QAC breakfast  . moxifloxacin  400 mg Intravenous Q24H  . pantoprazole (PROTONIX) IV  40 mg Intravenous Q24H  . potassium chloride  20 mEq Oral Daily  . sodium chloride      . sodium chloride      . DISCONTD: feeding supplement  30 mL Oral TID WC  . DISCONTD: feeding supplement  1 Container Oral BID   Continuous Infusions:   . dextrose 5 % and 0.45 % NaCl with KCl 20 mEq/L 20 mL/hr at 02/14/11 2330   PRN Meds:.acetaminophen, alum & mag hydroxide-simeth, bisacodyl, ondansetron (ZOFRAN) IV, ondansetron, phenol, senna-docusate, traMADol  Principal Problem:  *PNA (pneumonia) - we'll continue current antibiotics. Active Problems:  Acute renal failure- Will increase IV fluids, I have consulted nephrology for further recommendations the  Dysphagia - status post EGD, per GI  Anemia- hemoglobin stable, appreciate the GI systems.  Weakness generalized- low cortisol level noted above,? Adrenal insufficiency Will follow and do a cortrosyn stim test.  S/P hip replacement right- continue PT  OT.  Pain, joint, hip, right  Hypokalemia - resolved  Protein-calorie malnutrition, severe

## 2011-02-17 ENCOUNTER — Inpatient Hospital Stay (HOSPITAL_COMMUNITY): Payer: Medicare Other

## 2011-02-17 LAB — BASIC METABOLIC PANEL
CO2: 23 mEq/L (ref 19–32)
Calcium: 8.5 mg/dL (ref 8.4–10.5)
Chloride: 107 mEq/L (ref 96–112)
Creatinine, Ser: 3.22 mg/dL — ABNORMAL HIGH (ref 0.50–1.35)
Glucose, Bld: 91 mg/dL (ref 70–99)

## 2011-02-17 LAB — CLOSTRIDIUM DIFFICILE BY PCR: Toxigenic C. Difficile by PCR: NEGATIVE

## 2011-02-17 LAB — SODIUM, URINE, RANDOM: Sodium, Ur: 74 mEq/L

## 2011-02-17 MED ORDER — ALBUTEROL SULFATE (5 MG/ML) 0.5% IN NEBU
INHALATION_SOLUTION | RESPIRATORY_TRACT | Status: AC
  Administered 2011-02-17: 2.5 mg via RESPIRATORY_TRACT
  Filled 2011-02-17: qty 0.5

## 2011-02-17 MED ORDER — IPRATROPIUM BROMIDE 0.02 % IN SOLN
0.5000 mg | RESPIRATORY_TRACT | Status: DC
  Administered 2011-02-17 – 2011-02-19 (×12): 0.5 mg via RESPIRATORY_TRACT
  Filled 2011-02-17 (×11): qty 2.5

## 2011-02-17 MED ORDER — HYDROCOD POLST-CHLORPHEN POLST 10-8 MG/5ML PO LQCR: 5.0000 mL | Freq: Every evening | ORAL | Status: DC | PRN

## 2011-02-17 MED ORDER — IPRATROPIUM BROMIDE 0.02 % IN SOLN
RESPIRATORY_TRACT | Status: AC
  Administered 2011-02-17: 0.5 mg via RESPIRATORY_TRACT
  Filled 2011-02-17: qty 2.5

## 2011-02-17 MED ORDER — ALBUTEROL SULFATE (5 MG/ML) 0.5% IN NEBU
2.5000 mg | INHALATION_SOLUTION | Freq: Four times a day (QID) | RESPIRATORY_TRACT | Status: DC
  Administered 2011-02-17 – 2011-02-19 (×12): 2.5 mg via RESPIRATORY_TRACT
  Filled 2011-02-17 (×11): qty 0.5

## 2011-02-17 MED ORDER — SODIUM CHLORIDE 0.9 % IJ SOLN
INTRAMUSCULAR | Status: AC
  Administered 2011-02-17: 1 mL
  Filled 2011-02-17: qty 3

## 2011-02-17 NOTE — Progress Notes (Signed)
Subjective: Complaining of not getting any rest because of having to go back and forth of  Tests, states he was coughing most of last night. She denies any shortness of breath. He's also complaining about having different problems with different doctors taking care of them and he just wants to take care of one problem at a time. Objective: Vital signs in last 24 hours: Filed Vitals:   02/16/11 2159 02/16/11 2251 02/17/11 0602 02/17/11 0659  BP: 129/67  151/86   Pulse: 76 80 91   Temp: 97.5 F (36.4 C)  98.5 F (36.9 C)   TempSrc:      Resp: 16 16 20    Height:      Weight:      SpO2: 97% 98% 95% 96%   Weight change:   Intake/Output Summary (Last 24 hours) at 02/17/11 1201 Last data filed at 02/17/11 0600  Gross per 24 hour  Intake    740 ml  Output   1075 ml  Net   -335 ml    General Appearance:    Alert, cooperative, no distress, appears tired  Lungs:     decreased breath sounds at the bases., respirations unlabored   Heart:    Regular rate and rhythm, S1 and S2 normal, no murmur, rub   or gallop  Abdomen:     Soft, non-tender, bowel sounds active all four quadrants,    no masses, no organomegaly  Extremities:   Extremities normal, atraumatic, no cyanosis or edema   Lab Results:  Micro Results: Recent Results (from the past 240 hour(s))  URINE CULTURE     Status: Normal   Collection Time   02/10/11  7:46 PM      Component Value Range Status Comment   Specimen Description URINE, CLEAN CATCH   Final    Special Requests NONE   Final    Setup Time [id number]   Final    Colony Count NO GROWTH   Final    Culture NO GROWTH   Final    Report Status 02/12/2011 FINAL   Final   CULTURE, SPUTUM-ASSESSMENT     Status: Normal   Collection Time   02/11/11  2:08 AM      Component Value Range Status Comment   Specimen Description SPU EXPECTORATED   Final    Special Requests NONE   Final    Sputum evaluation     Final    Value: MICROSCOPIC FINDINGS SUGGEST THAT THIS SPECIMEN  IS NOT REPRESENTATIVE OF LOWER RESPIRATORY SECRETIONS. PLEASE RECOLLECT.     Results Called to: TRACY,RN AT 0428 ON 02/11/2011 BY MILES,A.     Performed at [facility]   Report Status 02/14/2011 FINAL   Final   CULTURE, SPUTUM-ASSESSMENT     Status: Normal   Collection Time   02/11/11  5:30 AM      Component Value Range Status Comment   Specimen Description SPUTUM   Final    Special Requests Normal   Final    Sputum evaluation     Final    Value: MICROSCOPIC FINDINGS SUGGEST THAT THIS SPECIMEN IS NOT REPRESENTATIVE OF LOWER RESPIRATORY SECRETIONS. PLEASE RECOLLECT.     Results Called to: MAVIS,J. AT 0915 ON 02/11/2011 BY RESSEGGER,R.    Report Status 02/11/2011 FINAL   Final   CULTURE, SPUTUM-ASSESSMENT     Status: Normal   Collection Time   02/16/11  5:03 AM      Component Value Range Status Comment   Specimen  Description SPUTUM EXPECTORATED   Final    Special Requests NONE   Final    Sputum evaluation     Final    Value: THIS SPECIMEN IS ACCEPTABLE. RESPIRATORY CULTURE REPORT TO FOLLOW.     Performed at [facility]   Report Status 02/16/2011 FINAL   Final   CULTURE, RESPIRATORY     Status: Normal (Preliminary result)   Collection Time   02/16/11  5:03 AM      Component Value Range Status Comment   Specimen Description SPUTUM EXPECTORATED   Final    Special Requests NONE   Final    Gram Stain     Final    Value: ABUNDANT WBC PRESENT, PREDOMINANTLY PMN     ABUNDANT SQUAMOUS EPITHELIAL CELLS PRESENT     RARE YEAST WITH PSEUDOHYPHAE   Culture     Final    Value: MODERATE CANDIDA ALBICANS     Note: MICROSCOPIC FINDINGS SUGGEST THAT THIS SPECIMEN IS NOT REPRESENTATIVE OF LOWER RESPIRATORY SECRETIONS. PLEASE RECOLLECT.   Report Status PENDING   Incomplete    Studies/Results: Results for orders placed during the hospital encounter of 02/10/11 (from the past 24 hour(s))  CREATININE, URINE, RANDOM     Status: Normal   Collection Time   02/16/11  2:26 PM      Component  Value Range   Creatinine, Urine 95.5    SODIUM, URINE, RANDOM     Status: Normal   Collection Time   02/16/11  2:26 PM      Component Value Range   Sodium, Ur 33    BASIC METABOLIC PANEL     Status: Abnormal   Collection Time   02/17/11  6:49 AM      Component Value Range   Sodium 141  135 - 145 (mEq/L)   Potassium 3.7  3.5 - 5.1 (mEq/L)   Chloride 107  96 - 112 (mEq/L)   CO2 23  19 - 32 (mEq/L)   Glucose, Bld 91  70 - 99 (mg/dL)   BUN 19  6 - 23 (mg/dL)   Creatinine, Ser 1.61 (*) 0.50 - 1.35 (mg/dL)   Calcium 8.5  8.4 - 09.6 (mg/dL)   GFR calc non Af Amer 18 (*) >60 (mL/min)   GFR calc Af Amer 22 (*) >60 (mL/min)    Scheduled Meds:   . albuterol  2.5 mg Nebulization QID   And  . ipratropium  0.5 mg Nebulization Q4H  . clindamycin (CLEOCIN) IV  600 mg Intravenous Q6H  . cosyntropin  0.25 mg Intravenous Once  . docusate sodium  100 mg Oral BID  . guaiFENesin  600 mg Oral BID  . levothyroxine  62.5 mcg Oral QAC breakfast  . moxifloxacin  400 mg Intravenous Q24H  . pantoprazole (PROTONIX) IV  40 mg Intravenous Q24H  . potassium chloride  20 mEq Oral Daily  . sodium chloride      . DISCONTD: albuterol  2.5 mg Nebulization Q4H  . DISCONTD: feeding supplement  30 mL Oral TID WC  . DISCONTD: feeding supplement  1 Container Oral BID  . DISCONTD: furosemide  20 mg Oral Daily  . DISCONTD: ipratropium  0.5 mg Nebulization Q4H   Continuous Infusions:   . dextrose 5 % and 0.45 % NaCl with KCl 20 mEq/L 100 mL/hr at 02/17/11 0626   PRN Meds:.acetaminophen, alum & mag hydroxide-simeth, bisacodyl, ondansetron (ZOFRAN) IV, ondansetron, phenol, senna-docusate, traMADol Assessment/Plan: Principal Problem:  *PNA (pneumonia)- we'll continue current antibiotics. We'll  add Tussionex at bedtime Active Problems:  Acute renal failure- renal function continues to worsen, please see their consult note dictated by renal Dr. Kristian Covey as he came to see the patient as requested but due to patient's  multiple complaints about testing and blood draws he was unable to do a consult. I will obtain a renal ultrasound. Will increase her IV fluids and as he is I&O's and still negative at this time. As discussed above patient insisting on only at taking care of one problem at a time - stating at this time just his pneumonia, but I have explained to him that the risk of doing that, and that taking care of she is a renal function are early and what the potentially avert  in need for dialysis.  Dysphagia  Anemia  Weakness generalized - we'll follow up on results of Cortrosyn stimulation test.  S/P hip replacement right -  PT following  Protein-calorie malnutrition, severe.    LOS: 7 days   Terasa Orsini C 02/17/2011, 12:01 PM

## 2011-02-17 NOTE — Progress Notes (Signed)
Subjective; patient feels some better today; he has been walking in the hall. His abdominal pain has almost gone; no diarrhea reported today. He states he has filled urine container twice today. Objective; BP 156/90  Pulse 96  Temp(Src) 98.4 F (36.9 C) (Oral)  Resp 18  Ht 5\' 10"  (1.778 m)  Wt 142 lb 13.7 oz (64.8 kg)  BMI 20.50 kg/m2  SpO2 95% Abdomen with normal bowel sounds. Mild periumblical tenderness. Lab data; Electrolytes normal; Cr 3.22; BUN 19. Cortisol and ACTH levels pending. C. Diff negative. CT abdomen; bilat pleural effusions; tiny per-iumblical hernia; colonic diverticulosis. 3 cm AAA. Renal US noted. Assessment. Mid- abdominal pain; improved but no cause evident. Acute renal failure being evaluated by Dr. Fausto Skillern; no evidence of obstructive uropathy.  Sec to ? Vancomycin. Anemia; no evidence of active GI Bleed. Low cortisol level; follow-up tests pending. Pneumonia; patient remains on antibiotics Dysphagia; S/P EGD/ED 3 days ago; difficult to tell if he is better since oral intake poor. Suggest. Continue PPI. Await results of Cortisol levels.

## 2011-02-17 NOTE — Progress Notes (Signed)
Pt to xray for renal ultrasound

## 2011-02-18 LAB — UIFE/LIGHT CHAINS/TP QN, 24-HR UR
Beta, Urine: DETECTED — AB
Free Kappa Lt Chains,Ur: 41.4 mg/dL — ABNORMAL HIGH (ref 0.14–2.42)
Free Lambda Lt Chains,Ur: 5.68 mg/dL — ABNORMAL HIGH (ref 0.02–0.67)
Gamma Globulin, Urine: DETECTED — AB

## 2011-02-18 LAB — CULTURE, RESPIRATORY W GRAM STAIN

## 2011-02-18 LAB — PROTEIN ELECTROPHORESIS, SERUM
Alpha-2-Globulin: 19 % — ABNORMAL HIGH (ref 7.1–11.8)
Beta 2: 5.2 % (ref 3.2–6.5)
Beta Globulin: 5.4 % (ref 4.7–7.2)
Gamma Globulin: 14.8 % (ref 11.1–18.8)
M-Spike, %: NOT DETECTED g/dL
Total Protein ELP: 6.5 g/dL (ref 6.0–8.3)

## 2011-02-18 LAB — BASIC METABOLIC PANEL
GFR calc Af Amer: 24 mL/min — ABNORMAL LOW (ref >60)
GFR calc non Af Amer: 19 mL/min — ABNORMAL LOW (ref >60)
Potassium: 4.1 mEq/L (ref 3.5–5.1)
Sodium: 136 mEq/L (ref 135–145)

## 2011-02-18 LAB — ACTH STIMULATION, 3 TIME POINTS: Cortisol, Base: 6.8 ug/dL

## 2011-02-18 MED ORDER — FUROSEMIDE 10 MG/ML IJ SOLN
80.0000 mg | Freq: Two times a day (BID) | INTRAMUSCULAR | Status: DC
  Administered 2011-02-18: 80 mg via INTRAVENOUS
  Filled 2011-02-18: qty 8

## 2011-02-18 MED ORDER — NEPRO/CARBSTEADY PO LIQD
237.0000 mL | Freq: Three times a day (TID) | ORAL | Status: DC
  Administered 2011-02-18 – 2011-02-24 (×12): 237 mL via ORAL

## 2011-02-18 MED ORDER — MOXIFLOXACIN HCL 400 MG PO TABS
400.0000 mg | ORAL_TABLET | Freq: Every day | ORAL | Status: DC
  Administered 2011-02-18 – 2011-02-23 (×6): 400 mg via ORAL
  Filled 2011-02-18 (×6): qty 1

## 2011-02-18 MED ORDER — SODIUM CHLORIDE 0.9 % IJ SOLN
INTRAMUSCULAR | Status: AC
  Administered 2011-02-18: 10 mL
  Filled 2011-02-18: qty 10

## 2011-02-18 NOTE — Progress Notes (Signed)
Subjective: Cough continues to be productive. Still with decreased by mouth intake the Objective: Vital signs in last 24 hours: Filed Vitals:   02/18/11 0717 02/18/11 1117 02/18/11 1346 02/18/11 1544  BP:   144/84   Pulse:   101   Temp:   98 F (36.7 C)   TempSrc:   Oral   Resp:   18   Height:      Weight:      SpO2: 92% 94% 94% 94%   Weight change:   Intake/Output Summary (Last 24 hours) at 02/18/11 1557 Last data filed at 02/18/11 1549  Gross per 24 hour  Intake 1971.25 ml  Output   2620 ml  Net -648.75 ml    General Appearance:    Alert, cooperative, no distress.  Lungs:     decreased breath sounds at the bases., respirations unlabored   Heart:    Regular rate and rhythm, S1 and S2 normal, no murmur, rub   or gallop  Abdomen:     Soft, non-tender, bowel sounds active all four quadrants,    no masses, no organomegaly  Extremities:   Extremities normal, atraumatic, no cyanosis or edema  Neurologic:   CNII-XII intact, nonfocal          Lab Results:  Micro Results : Recent Results (from the past 240 hour(s))  URINE CULTURE     Status: Normal   Collection Time   02/10/11  7:46 PM      Component Value Range Status Comment   Specimen Description URINE, CLEAN CATCH   Final    Special Requests NONE   Final    Setup Time [id number]   Final    Colony Count NO GROWTH   Final    Culture NO GROWTH   Final    Report Status 02/12/2011 FINAL   Final   CULTURE, SPUTUM-ASSESSMENT     Status: Normal   Collection Time   02/11/11  2:08 AM      Component Value Range Status Comment   Specimen Description SPU EXPECTORATED   Final    Special Requests NONE   Final    Sputum evaluation     Final    Value: MICROSCOPIC FINDINGS SUGGEST THAT THIS SPECIMEN IS NOT REPRESENTATIVE OF LOWER RESPIRATORY SECRETIONS. PLEASE RECOLLECT.     Results Called to: TRACY,RN AT 0428 ON 02/11/2011 BY MILES,A.     Performed at [facility]   Report Status 02/14/2011 FINAL   Final   CULTURE,  SPUTUM-ASSESSMENT     Status: Normal   Collection Time   02/11/11  5:30 AM      Component Value Range Status Comment   Specimen Description SPUTUM   Final    Special Requests Normal   Final    Sputum evaluation     Final    Value: MICROSCOPIC FINDINGS SUGGEST THAT THIS SPECIMEN IS NOT REPRESENTATIVE OF LOWER RESPIRATORY SECRETIONS. PLEASE RECOLLECT.     Results Called to: MAVIS,J. AT 0915 ON 02/11/2011 BY RESSEGGER,R.    Report Status 02/11/2011 FINAL   Final   CULTURE, SPUTUM-ASSESSMENT     Status: Normal   Collection Time   02/16/11  5:03 AM      Component Value Range Status Comment   Specimen Description SPUTUM EXPECTORATED   Final    Special Requests NONE   Final    Sputum evaluation     Final    Value: THIS SPECIMEN IS ACCEPTABLE. RESPIRATORY CULTURE REPORT TO FOLLOW.  Performed at [facility]   Report Status 02/16/2011 FINAL   Final   CULTURE, RESPIRATORY     Status: Normal   Collection Time   02/16/11  5:03 AM      Component Value Range Status Comment   Specimen Description SPUTUM EXPECTORATED   Final    Special Requests NONE   Final    Gram Stain     Final    Value: ABUNDANT WBC PRESENT, PREDOMINANTLY PMN     ABUNDANT SQUAMOUS EPITHELIAL CELLS PRESENT     RARE YEAST WITH PSEUDOHYPHAE   Culture MODERATE CANDIDA ALBICANS   Final    Report Status 02/18/2011 FINAL   Final   CLOSTRIDIUM DIFFICILE BY PCR     Status: Normal   Collection Time   02/17/11  2:39 PM      Component Value Range Status Comment   C difficile by pcr NEGATIVE  NEGATIVE  Final    Studies/Results: Ct Abdomen Pelvis Wo Contrast  .  IMPRESSION: Bibasilar effusions and atelectasis versus infiltrate. Due to somewhat mass-like appearance of opacity at the left lower lobe, recommend radiographic follow-up until resolution. Bilateral renal cysts and nonobstructing left renal calculus. Colonic diverticulosis without diverticulitis. Small abdominal aortic aneurysm 3 cm diameter. Tiny periumbilical hernia.  No definite acute intra abdominal or intrapelvic abnormalities.  Original Report Authenticated By: Lollie Marrow, M.D.   US Renal IMPRESSION: Bilateral renal cortical thinning. Bilateral renal cysts. Prostatic enlargement.  Original Report Authenticated By: Lollie Marrow, M.D.  Marland Kitchenre  Scheduled Meds:   . albuterol  2.5 mg Nebulization QID   And  . ipratropium  0.5 mg Nebulization Q4H  . feeding supplement  237 mL Oral TID WC  . furosemide  80 mg Intravenous BID  . guaiFENesin  600 mg Oral BID  . levothyroxine  62.5 mcg Oral QAC breakfast  . moxifloxacin  400 mg Oral q1800  . pantoprazole (PROTONIX) IV  40 mg Intravenous Q24H  . sodium chloride      . DISCONTD: docusate sodium  100 mg Oral BID  . DISCONTD: moxifloxacin  400 mg Intravenous Q24H  . DISCONTD: potassium chloride  20 mEq Oral Daily   Continuous Infusions:   . dextrose 5 % and 0.45 % NaCl with KCl 20 mEq/L 145 mL/hr at 02/18/11 1242   PRN Meds:.acetaminophen, alum & mag hydroxide-simeth, bisacodyl, chlorpheniramine-HYDROcodone, ondansetron (ZOFRAN) IV, ondansetron, phenol, senna-docusate, traMADol Assessment/Plan: Principal Problem:  *PNA (pneumonia)- we'll continue current antibiotics.  Active Problems:  Acute renal failure- spoke with the patient and family today and they agreed to have nephrology come back to see him. I appreciate the doctor Befekadu's Is assistance with his care. IV fluids increased. Renal ultrasound done-revealing prostatic enlargement but otherwise unremarkable.  Weakness generalized -  Cortrosyn stimulation test with no evidence of Addison's S/P hip replacement right - PT following  Protein-calorie malnutrition, severe.  - We'll add Megace as recommended per Dr. Karilyn Cota and monitor for any improvement in his by mouth intake.   LOS: 8 days   Dang Mathison C 02/18/2011, 3:57 PM

## 2011-02-18 NOTE — Progress Notes (Signed)
See other note from same date. This is duplicate

## 2011-02-18 NOTE — Progress Notes (Signed)
Per pt and wife pt will return home with [facility] services, rn, and pt. Notified [facility] nurse linda.

## 2011-02-18 NOTE — Progress Notes (Signed)
Subjective; patient has multiple complaints. He states his head hurts; he has upset stomach; he had loose stool this morning; he says he is passing a lot of urine; he also complains of being short of breath but denies chest pain. He has no appetite other he has not experienced nausea or vomiting; has minimal throat discomfort. He did drink apple juice this morning and had no problem. He continues to complain of mid abdominal soreness. He is still coughing up thick sputum volume has gone down over the last 2 days. Objective; BP 166/92  Pulse 79  Temp(Src) 97.2 F (36.2 C) (Oral)  Resp 20  Ht 5\' 10"  (1.778 m)  Wt 142 lb 13.7 oz (64.8 kg)  BMI 20.50 kg/m2  SpO2 92% Abdomen is symmetrical. Bowel sounds are normal. Soft abdomen with vague mid abdominal tenderness without guarding or rebound. No organomegaly or masses. He does not have peripheral edema. Lab data; Serum sodium 136, potassium 4.1, Roy 105, CO2 23, glucose 114, BUN 15, creatinine 3.08 Fasting cortisol level is 6.8 which is within normal range. Post stimulation cortisol level at 60 minutes is 16.4. Urine output last 24 hours was 2470 ml. Assessment; Patient has multiple problems symptomatically he's not improving. #1. Abdominal pain was resected antibiotic therapy. Dominant pelvic CT yesterday was unremarkable. His diarrhea is either due to oral contrast that was given to him yesterday for CT or secondary antibiotics. His C. difficile by PCR is negative. #2. Anemia; H&H has remained stable and there is no evidence of active bleeding. He already has undergone EGD and colonoscopy would be performed later on. #3. Non-oliguric renal failure; creatinine is coming down. Dr. Fausto Skillern assisting in renal care. #4. Anorexia secondary to acute illness and antibiotics. #5. Pneumonia; she remains on Avelox. #6.  ACTH stim test is normal; therefore he does not have adrenal cortical insufficiency Recommendations; Discontinue Colace. Continue  PPI. Consider Megace.   Please note agents condition discussed with his wife Mrs. peniel hass. Dr. Jena Gauss will be assisting with GI issues over the weekend

## 2011-02-18 NOTE — Consult Note (Signed)
Reason for Consult: Worsening of her renal failure Referring Physician [facility]  Jorge Nixon is an 77 y.o. male.  HPI: He is an inpatient once her history of  kidney stone recently and discharged from the hospital after surgery right hip for a fracture. Her after patient was in nursing home started having cough and weakness and difficulty increasing and he is brought her to the hospital and was found to have bilateral pneumonia. Presently he states he is slightly feeling better but the complaints of weakness poor appetite and also difficulty swallowing. Patient denies any previous history of renal failure  And a has this moment he denies any shortness of breath except exertional dyspnea.  Past Medical History  Diagnosis Date  . Hypertension   . Diverticular disease 02/14/2011    problems swallowing  . GERD (gastroesophageal reflux disease)   . Coronary artery disease   . Kidney stone   . Hypothyroidism   . Pneumonia   . Anemia     Past Surgical History  Procedure Date  . Total hip arthroplasty august 6th 2012    right side   . Replacement total knee 2006    left knee  . Transurethral resection of prostate 1994 or 1995    History reviewed. No pertinent family history.  Social History:  reports that he has never smoked. He does not have any smokeless tobacco history on file. He reports that he does not drink alcohol or use illicit drugs.  Allergies:  Allergies  Allergen Reactions  . Sulfa Antibiotics Rash    Medications: I have reviewed the patient's current medications.  Results for orders placed during the hospital encounter of 02/10/11 (from the past 48 hour(s))  CREATININE, URINE, RANDOM     Status: Normal   Collection Time   02/16/11  2:26 PM      Component Value Range Comment   Creatinine, Urine 95.5     SODIUM, URINE, RANDOM     Status: Normal   Collection Time   02/16/11  2:26 PM      Component Value Range Comment   Sodium, Ur 33     PROTEIN  ELECTROPHORESIS, URINE     Status: Abnormal   Collection Time   02/16/11  2:26 PM      Component Value Range Comment   Time RANDOM   CORRECTED ON 09/20 AT 1458: PREVIOUSLY REPORTED AS 24   Volume, Urine RANDOM      Total Protein, Urine 49.0   No established reference range.   Total Protein, Urine-Ur/day NOT CALC  10 - 140 (mg/day)    Albumin, U PENDING      Alpha 1, Urine PENDING      Alpha 2, Urine PENDING      Beta, Urine PENDING      Gamma Globulin, Urine PENDING      Free Kappa Lt Chains,Ur 41.40 (*) 0.14 - 2.42 (mg/dL)    Free Lt Chn Excr Rate NOT CALC      Free Lambda Lt Chains,Ur 5.68 (*) 0.02 - 0.67 (mg/dL)    Free Lambda Excretion/Day NOT CALC      Free Kappa/Lambda Ratio 7.29  2.04 - 10.37 (ratio) (NOTE)   Immunofixation, Urine PENDING     BASIC METABOLIC PANEL     Status: Abnormal   Collection Time   02/17/11  6:49 AM      Component Value Range Comment   Sodium 141  135 - 145 (mEq/L)    Potassium 3.7  3.5 - 5.1 (mEq/L)    Chloride 107  96 - 112 (mEq/L)    CO2 23  19 - 32 (mEq/L)    Glucose, Bld 91  70 - 99 (mg/dL)    BUN 19  6 - 23 (mg/dL)    Creatinine, Ser 1.91 (*) 0.50 - 1.35 (mg/dL)    Calcium 8.5  8.4 - 10.5 (mg/dL)    GFR calc non Af Amer 18 (*) >60 (mL/min)    GFR calc Af Amer 22 (*) >60 (mL/min)   ACTH STIMULATION, 3 TIME POINTS     Status: Normal   Collection Time   02/17/11  6:49 AM      Component Value Range Comment   Cortisol, Base 6.8      Cortisol, 30 Min 16.0  > 20 (ug/dL)    Cortisol, 60 Min 47.8  > 20 (ug/dL)   SODIUM, URINE, RANDOM     Status: Normal   Collection Time   02/17/11  2:39 PM      Component Value Range Comment   Sodium, Ur 74     CREATININE, URINE, RANDOM     Status: Normal   Collection Time   02/17/11  2:39 PM      Component Value Range Comment   Creatinine, Urine 34.8     CLOSTRIDIUM DIFFICILE BY PCR     Status: Normal   Collection Time   02/17/11  2:39 PM      Component Value Range Comment   C difficile by pcr NEGATIVE   NEGATIVE    BASIC METABOLIC PANEL     Status: Abnormal   Collection Time   02/18/11  9:47 AM      Component Value Range Comment   Sodium 136  135 - 145 (mEq/L)    Potassium 4.1  3.5 - 5.1 (mEq/L)    Chloride 105  96 - 112 (mEq/L)    CO2 23  19 - 32 (mEq/L)    Glucose, Bld 114 (*) 70 - 99 (mg/dL)    BUN 15  6 - 23 (mg/dL)    Creatinine, Ser 2.95 (*) 0.50 - 1.35 (mg/dL)    Calcium 8.3 (*) 8.4 - 10.5 (mg/dL)    GFR calc non Af Amer 19 (*) >60 (mL/min)    GFR calc Af Amer 24 (*) >60 (mL/min)     Ct Abdomen Pelvis Wo Contrast  02/17/2011  *RADIOLOGY REPORT*  Clinical Data: Mid abdominal pain, GERD, hypertension, history kidney stones, coronary disease, right hip replacement  CT ABDOMEN AND PELVIS WITHOUT CONTRAST  Technique:  Multidetector CT imaging of the abdomen and pelvis was performed following the standard protocol without intravenous contrast. Sagittal and coronal MPR images reconstructed from axial data set.  IV contrast not utilized due to renal dysfunction.  Comparison: 07/02/2005  Findings: Small bibasilar pleural effusions with atelectasis versus infiltrate at both lung bases Portions of the left lower lobe opacity are somewhat mass-like in appearance. Calcified granulomata in liver. Probable cyst medial aspect upper pole right kidney, 1.9 x 1.5 cm image 24. Nonobstructing calculus lower pole left kidney 4 mm diameter image 35. Large peripelvic cyst left kidney, 4.1 x 4.4 cm, increased. Remainder of the liver, spleen, pancreas, kidneys, and adrenal glands normal. Diverticula at second portion of duodenum. Extensive atherosclerotic calcifications with mild aneurysmal dilatation of infrarenal abdominal aorta 3.0 x 2.7 cm image 38. Common iliac arteries dilated bilaterally, 1.4 cm diameter left 1.5 cm right. Scattered colonic diverticuli without evidence of diverticulitis. Normal appendix. Stomach  remaining bowel loops unremarkable. Right hip prosthesis with beam hardening artifacts obscuring  portions of pelvis. Dilated inguinal rings and proximal left inguinal canal raising question of inguinal hernias. No mass, adenopathy, free fluid or inflammatory process. Tiny periumbilical hernia containing fat. No free intraperitoneal air. Diffuse osseous demineralization.  IMPRESSION: Bibasilar effusions and atelectasis versus infiltrate. Due to somewhat mass-like appearance of opacity at the left lower lobe, recommend radiographic follow-up until resolution. Bilateral renal cysts and nonobstructing left renal calculus. Colonic diverticulosis without diverticulitis. Small abdominal aortic aneurysm 3 cm diameter. Tiny periumbilical hernia. No definite acute intra abdominal or intrapelvic abnormalities.  Original Report Authenticated By: Lollie Marrow, M.D.   US Renal  02/17/2011  *RADIOLOGY REPORT*  Clinical Data: Worsening renal function, history hypertension, kidney stones  RENAL/URINARY TRACT ULTRASOUND COMPLETE  Comparison:  None Correlation:  CT abdomen 02/17/2011  Findings:  Right Kidney:  10.7 cm length.  Cortical thinning.  Upper normal cortical thickness.  Question small cyst upper pole 1.1 x 0.5 x 1.4 cm.  No additional mass, hydronephrosis or shadowing calcification.  Left Kidney:  11.5 cm length.  Cortical thinning.  Upper normal cortical echogenicity.  Peripelvic cyst 3.4 x 4.2 x 3.7 cm, as noted on prior CT.  No additional mass, hydronephrosis or shadowing calcification.  Shadowing calculus seen at lower pole on the CT not definitely visualized sonographically.  Bladder:  Well-distended, normal appearance.  Prostate gland appears enlarged, 4.4 x 5.6 x 3.7 cm with calculated volume of 47.3 ml.  IMPRESSION: Bilateral renal cortical thinning. Bilateral renal cysts. Prostatic enlargement.  Original Report Authenticated By: Lollie Marrow, M.D.    Review of Systems  Constitutional: Positive for weight loss. Negative for fever and chills.  Respiratory: Positive for shortness of breath. Negative for  cough and sputum production.   Cardiovascular: Negative for chest pain and orthopnea.  Gastrointestinal: Positive for heartburn, abdominal pain and diarrhea. Negative for nausea, vomiting and blood in stool.  Musculoskeletal: Positive for joint pain.  Neurological: Positive for weakness.   Blood pressure 166/92, pulse 79, temperature 97.2 F (36.2 C), temperature source Oral, resp. rate 20, height 5\' 10"  (1.778 m), weight 64.8 kg (142 lb 13.7 oz), SpO2 94.00%. Physical Exam  Constitutional: He is oriented to person, place, and time.  Eyes: No scleral icterus.  Neck: No JVD present.  Cardiovascular: Normal rate, regular rhythm, normal heart sounds and intact distal pulses.  Exam reveals no gallop and no friction rub.   No murmur heard. Respiratory: No respiratory distress. He has no wheezes. He has no rales.  GI: There is no tenderness. There is no rebound.  Musculoskeletal: He exhibits no edema.  Neurological: He is alert and oriented to person, place, and time.  Skin: Skin is dry.    Assessment/Plan: Problem #1 acute kidney injury possibly superimposed on her chronic his pending creatinine has been increasing. Her patient has this moment seems to be none oliguric. The etiology for his renal failure her could be secondary to interstitial nephritis possibly from the antibiotics her. However her since the patient has had dental creatinine ratio of poor by mouth intake her as this moment superimposed her prerenal syndrome cannot ruled out. Other etiologies such as her AK and also need to be included into the differential diagnosis. Presently patient is nonoliguric. Problem #2 history of her bilateral pneumonia is on antibiotics and a seems to be feeling better. Problem #3 history of hypertension his blood pressure seems to be controlled very well Problem #4  history  of her anemia at this moment his iron saturation is low but his ferritin is high hence I would may be dealing wheeze iron  deficiency anemia and anemia of chronic disease. His H&H is stable patient is status post a blood transfusion. Problem #5 her proteinuria in nephrotic range. He sat 24-hour urine and protein for him in no showed her course her her increase In the lambda her proteinuria. However the ratio  was seems to be normal hence I will be dealing with the polyclonal he had. Problem #6 history of  difficulty in swallowing i.e. dysphasia patient is status post endoscopy with  Dilatation but still complains of the same issue. Problem #7 history of degenerative joint disease Problem #8 history of her he surgery presently seems to be feeling better. Problem #9 malnutrition patient with poor appetite. Still the etiology is unclear her however since the patient has elevated BUN and creatinine or sure whether the uremia  may be contributed to  His poor apetite . Recommendation I will increase his IV fluid 245 cc per hour since his urine output is still very poor her as though he is nonoliguric we'll start her on Lasix 100 mg IV twice a day I will start on Nepro one can by mouth twice a day We'll check his basic metabolic panel CBC and phosphorus her he's the morning. And the [facility] check also his UA. Since the patient has ultrasound of the kidneys at this moment will hold any further study her her. If patient however has significant proteinuria probably will check her for his side NA and also hepatitis status. And this is end of  consult thank you  [facility] S 02/18/2011, 12:20 PM

## 2011-02-19 DIAGNOSIS — R109 Unspecified abdominal pain: Secondary | ICD-10-CM

## 2011-02-19 DIAGNOSIS — D649 Anemia, unspecified: Secondary | ICD-10-CM

## 2011-02-19 LAB — CBC
HCT: 34 % — ABNORMAL LOW (ref 39.0–52.0)
Hemoglobin: 11.1 g/dL — ABNORMAL LOW (ref 13.0–17.0)
RDW: 16.8 % — ABNORMAL HIGH (ref 11.5–15.5)
WBC: 10.1 10*3/uL (ref 4.0–10.5)

## 2011-02-19 LAB — BASIC METABOLIC PANEL
Chloride: 102 mEq/L (ref 96–112)
GFR calc Af Amer: 23 mL/min — ABNORMAL LOW (ref >60)
Potassium: 4.3 mEq/L (ref 3.5–5.1)
Sodium: 136 mEq/L (ref 135–145)

## 2011-02-19 MED ORDER — MAGIC MOUTHWASH W/LIDOCAINE
10.0000 mL | Freq: Three times a day (TID) | ORAL | Status: DC | PRN
  Filled 2011-02-19: qty 10

## 2011-02-19 MED ORDER — PANTOPRAZOLE SODIUM 40 MG PO TBEC
40.0000 mg | DELAYED_RELEASE_TABLET | Freq: Every day | ORAL | Status: DC
  Administered 2011-02-20 – 2011-02-24 (×5): 40 mg via ORAL
  Filled 2011-02-19 (×5): qty 1

## 2011-02-19 MED ORDER — SODIUM CHLORIDE 0.9 % IJ SOLN
INTRAMUSCULAR | Status: AC
  Administered 2011-02-19: 10 mL
  Filled 2011-02-19: qty 10

## 2011-02-19 MED ORDER — MEGESTROL ACETATE 400 MG/10ML PO SUSP
200.0000 mg | Freq: Every day | ORAL | Status: DC
  Administered 2011-02-19 – 2011-02-24 (×6): 200 mg via ORAL
  Filled 2011-02-19 (×4): qty 5
  Filled 2011-02-19 (×2): qty 10
  Filled 2011-02-19 (×2): qty 5

## 2011-02-19 NOTE — Progress Notes (Signed)
Subjective: Patient complains of abdominal pain after eating. Mouth also hurts. He denies dysphagia or odynophagia symptoms. No nausea or vomiting. No hematemesis, melena or hematochezia. Regular BM this morning. Wife and daughter-in-law in attendance and supplement the history.  Life long lactose intolerance history .  Megace started today.  Objective: Vital signs in last 24 hours: Temp:  [98 F (36.7 C)-99.1 F (37.3 C)] 98.1 F (36.7 C) (09/22 0500) Pulse Rate:  [101-102] 102  (09/22 0500) Resp:  [18-20] 18  (09/22 0500) BP: (144-161)/(84-97) 157/97 mmHg (09/22 0500) SpO2:  [92 %-97 %] 94 % (09/22 1057) Last BM Date: 02/17/11 General:   Alert,  frail appearing gentleman in no acute distress .  I do not see any oral mucosal lesions.  Abdomen:   Positive bowel sounds.  No bruits. Soft; he has localized tenderness and guarding right upper quadrant at mid axillary line . No mass organomegaly  Extremities:  Without clubbing or edema.    Intake/Output from previous day: 09/21 0701 - 09/22 0700 In: 3348.6 [P.O.:360; I.V.:2988.6] Out: 4475 [Urine:4475] Intake/Output this shift:    Lab Results:  Basename 02/19/11 0548  WBC 10.1  HGB 11.1*  HCT 34.0*  PLT 271   BMET  Basename 02/19/11 0548 02/18/11 0947 02/17/11 0649  NA 136 136 141  K 4.3 4.1 3.7  CL 102 105 107  CO2 25 23 23   GLUCOSE 105* 114* 91  BUN 14 15 19   CREATININE 3.13* 3.08* 3.22*  CALCIUM 8.6 8.3* 8.5  Studies/Results: US Renal  02/17/2011  *RADIOLOGY REPORT*  Clinical Data: Worsening renal function, history hypertension, kidney stones  RENAL/URINARY TRACT ULTRASOUND COMPLETE  Comparison:  None Correlation:  CT abdomen 02/17/2011  Findings:  Right Kidney:  10.7 cm length.  Cortical thinning.  Upper normal cortical thickness.  Question small cyst upper pole 1.1 x 0.5 x 1.4 cm.  No additional mass, hydronephrosis or shadowing calcification.  Left Kidney:  11.5 cm length.  Cortical thinning.  Upper normal cortical  echogenicity.  Peripelvic cyst 3.4 x 4.2 x 3.7 cm, as noted on prior CT.  No additional mass, hydronephrosis or shadowing calcification.  Shadowing calculus seen at lower pole on the CT not definitely visualized sonographically.  Bladder:  Well-distended, normal appearance.  Prostate gland appears enlarged, 4.4 x 5.6 x 3.7 cm with calculated volume of 47.3 ml.  IMPRESSION: Bilateral renal cortical thinning. Bilateral renal cysts. Prostatic enlargement.  Original Report Authenticated By: Lollie Marrow, M.D.    Assessment:  Multiple GI complaints in the setting of a recent hip replacement, pneumonia and non-oliguric renal failure. Anemia with Hemoccult positive stool. No overt GI bleeding  Recent EGD and noncontrast CT scan as documented. Mouth pain and postprandial abdominal pain. Abdominal tenderness localized to right upper quadrant.  I doubt ischemia. Gallbladder remains in situ. No recent imaging of the gallbladder.  Recommendations:  Agree with Megace. Lactose-free diet. Empiric trial of Magic mouthwash (discussed with pharmacy).   Right upper quadrant ultrasound.  Further recommendations to follow.  LOS: 9 days   Eula Listen  02/19/2011, 1:39 PM

## 2011-02-19 NOTE — Progress Notes (Signed)
Subjective: Interval History: has no complaint of nausea or vomiting . Patient her denies any shortness of breath. Patient is still feeling weak. The only complaint she has is her polyuria her.  Objective: Vital signs in last 24 hours: Temp:  [98 F (36.7 C)-99.1 F (37.3 C)] 98.1 F (36.7 C) (09/22 0500) Pulse Rate:  [101-102] 102  (09/22 0500) Resp:  [18-20] 18  (09/22 0500) BP: (144-161)/(84-97) 157/97 mmHg (09/22 0500) SpO2:  [92 %-97 %] 97 % (09/22 0712) Weight change:   Intake/Output from previous day: 09/21 0701 - 09/22 0700 In: 3248.6 [P.O.:260; I.V.:2988.6] Out: 4100 [Urine:4100] Intake/Output this shift:    General appearance: alert Resp: clear to auscultation bilaterally Cardio: regular rate and rhythm, S1, S2 normal, no murmur, click, rub or gallop GI: soft, non-tender; bowel sounds normal; no masses,  no organomegaly Extremities: extremities normal, atraumatic, no cyanosis or edema  Lab Results:  [facility] 02/19/11 0548  WBC 10.1  HGB 11.1*  HCT 34.0*  PLT 271   BMET:  Basename 02/19/11 0548 02/18/11 0947  NA 136 136  K 4.3 4.1  CL 102 105  CO2 25 23  GLUCOSE 105* 114*  BUN 14 15  CREATININE 3.13* 3.08*  CALCIUM 8.6 8.3*   No results found for this basename: PTH:2 in the last 72 hours Iron Studies: No results found for this basename: IRON,TIBC,TRANSFERRIN,FERRITIN in the last 72 hours  Studies/Results: Ct Abdomen Pelvis Wo Contrast  02/17/2011  *RADIOLOGY REPORT*  Clinical Data: Mid abdominal pain, GERD, hypertension, history kidney stones, coronary disease, right hip replacement  CT ABDOMEN AND PELVIS WITHOUT CONTRAST  Technique:  Multidetector CT imaging of the abdomen and pelvis was performed following the standard protocol without intravenous contrast. Sagittal and coronal MPR images reconstructed from axial data set.  IV contrast not utilized due to renal dysfunction.  Comparison: 07/02/2005  Findings: Small bibasilar pleural effusions with  atelectasis versus infiltrate at both lung bases Portions of the left lower lobe opacity are somewhat mass-like in appearance. Calcified granulomata in liver. Probable cyst medial aspect upper pole right kidney, 1.9 x 1.5 cm image 24. Nonobstructing calculus lower pole left kidney 4 mm diameter image 35. Large peripelvic cyst left kidney, 4.1 x 4.4 cm, increased. Remainder of the liver, spleen, pancreas, kidneys, and adrenal glands normal. Diverticula at second portion of duodenum. Extensive atherosclerotic calcifications with mild aneurysmal dilatation of infrarenal abdominal aorta 3.0 x 2.7 cm image 38. Common iliac arteries dilated bilaterally, 1.4 cm diameter left 1.5 cm right. Scattered colonic diverticuli without evidence of diverticulitis. Normal appendix. Stomach remaining bowel loops unremarkable. Right hip prosthesis with beam hardening artifacts obscuring portions of pelvis. Dilated inguinal rings and proximal left inguinal canal raising question of inguinal hernias. No mass, adenopathy, free fluid or inflammatory process. Tiny periumbilical hernia containing fat. No free intraperitoneal air. Diffuse osseous demineralization.  IMPRESSION: Bibasilar effusions and atelectasis versus infiltrate. Due to somewhat mass-like appearance of opacity at the left lower lobe, recommend radiographic follow-up until resolution. Bilateral renal cysts and nonobstructing left renal calculus. Colonic diverticulosis without diverticulitis. Small abdominal aortic aneurysm 3 cm diameter. Tiny periumbilical hernia. No definite acute intra abdominal or intrapelvic abnormalities.  Original Report Authenticated By: Lollie Marrow, M.D.   US Renal  02/17/2011  *RADIOLOGY REPORT*  Clinical Data: Worsening renal function, history hypertension, kidney stones  RENAL/URINARY TRACT ULTRASOUND COMPLETE  Comparison:  None Correlation:  CT abdomen 02/17/2011  Findings:  Right Kidney:  10.7 cm length.  Cortical thinning.  Upper normal  cortical thickness.  Question small cyst upper pole 1.1 x 0.5 x 1.4 cm.  No additional mass, hydronephrosis or shadowing calcification.  Left Kidney:  11.5 cm length.  Cortical thinning.  Upper normal cortical echogenicity.  Peripelvic cyst 3.4 x 4.2 x 3.7 cm, as noted on prior CT.  No additional mass, hydronephrosis or shadowing calcification.  Shadowing calculus seen at lower pole on the CT not definitely visualized sonographically.  Bladder:  Well-distended, normal appearance.  Prostate gland appears enlarged, 4.4 x 5.6 x 3.7 cm with calculated volume of 47.3 ml.  IMPRESSION: Bilateral renal cortical thinning. Bilateral renal cysts. Prostatic enlargement.  Original Report Authenticated By: Lollie Marrow, M.D.    I have reviewed the patient'Nixon current medications.  Assessment/Plan: Problem #1 renal failure acute on chronic pending creatinine has seems to be somewhat stable  Presently except for appetite patient doesn't seem to have any sign of uremia and Her problem #2 history of her comment acquired pneumonia patient her febrile. Her Her problem #3 history of her dysphasia Her problem #4 history of hypokalemia patient potassium has corrected. Problem #5 history of her poor nutrition history by mouth intake is still nose a great. Problem #6 history of anemia H&H stable he status post blood transfusion. Problem #7 history of her right hip replacement. Recommendation her because of  polyuria her deceased Lasix today We'll continue with IV fluid at present right We'll check his basic metabolic panel and CBC in the morning.    LOS: 9 days   Jorge Nixon 02/19/2011,8:19 AM

## 2011-02-19 NOTE — Progress Notes (Signed)
Subjective: Per family ate a little better yesterday, but still with some abdominal  pain after eating, no nausea or vomiting, no further diarrhea reported. Objective: Vital signs in last 24 hours: Filed Vitals:   02/19/11 0500 02/19/11 0712 02/19/11 1057 02/19/11 1526  BP: 157/97     Pulse: 102     Temp: 98.1 F (36.7 C)     TempSrc: Oral     Resp: 18     Height:      Weight:      SpO2: 94% 97% 94% 94%   Weight change:   Intake/Output Summary (Last 24 hours) at 02/19/11 1537 Last data filed at 02/19/11 1300  Gross per 24 hour  Intake 2687.33 ml  Output   4950 ml  Net -2262.67 ml    General Appearance:    Alert, cooperative, no distress  Lungs:     decreased breath sounds at the bases, no wheezes or crackles   Heart:    Regular rate and rhythm, S1 and S2 normal, no murmur, rub   or gallop  Abdomen:     Soft, non-tender, bowel sounds active all four quadrants,    no masses, no organomegaly  Extremities:   Extremities normal, atraumatic, no cyanosis or edema      Lab Results: @labtest2 @ Micro Results: Recent Results (from the past 240 hour(s))  URINE CULTURE     Status: Normal   Collection Time   02/10/11  7:46 PM      Component Value Range Status Comment   Specimen Description URINE, CLEAN CATCH   Final    Special Requests NONE   Final    Setup Time [id number]   Final    Colony Count NO GROWTH   Final    Culture NO GROWTH   Final    Report Status 02/12/2011 FINAL   Final   CULTURE, SPUTUM-ASSESSMENT     Status: Normal   Collection Time   02/11/11  2:08 AM      Component Value Range Status Comment   Specimen Description SPU EXPECTORATED   Final    Special Requests NONE   Final    Sputum evaluation     Final    Value: MICROSCOPIC FINDINGS SUGGEST THAT THIS SPECIMEN IS NOT REPRESENTATIVE OF LOWER RESPIRATORY SECRETIONS. PLEASE RECOLLECT.     Results Called to: TRACY,RN AT 0428 ON 02/11/2011 BY MILES,A.     Performed at [facility]   Report Status  02/14/2011 FINAL   Final   CULTURE, SPUTUM-ASSESSMENT     Status: Normal   Collection Time   02/11/11  5:30 AM      Component Value Range Status Comment   Specimen Description SPUTUM   Final    Special Requests Normal   Final    Sputum evaluation     Final    Value: MICROSCOPIC FINDINGS SUGGEST THAT THIS SPECIMEN IS NOT REPRESENTATIVE OF LOWER RESPIRATORY SECRETIONS. PLEASE RECOLLECT.     Results Called to: MAVIS,J. AT 0915 ON 02/11/2011 BY RESSEGGER,R.    Report Status 02/11/2011 FINAL   Final   CULTURE, SPUTUM-ASSESSMENT     Status: Normal   Collection Time   02/16/11  5:03 AM      Component Value Range Status Comment   Specimen Description SPUTUM EXPECTORATED   Final    Special Requests NONE   Final    Sputum evaluation     Final    Value: THIS SPECIMEN IS ACCEPTABLE. RESPIRATORY CULTURE REPORT TO FOLLOW.  Performed at [facility]   Report Status 02/16/2011 FINAL   Final   CULTURE, RESPIRATORY     Status: Normal   Collection Time   02/16/11  5:03 AM      Component Value Range Status Comment   Specimen Description SPUTUM EXPECTORATED   Final    Special Requests NONE   Final    Gram Stain     Final    Value: ABUNDANT WBC PRESENT, PREDOMINANTLY PMN     ABUNDANT SQUAMOUS EPITHELIAL CELLS PRESENT     RARE YEAST WITH PSEUDOHYPHAE   Culture MODERATE CANDIDA ALBICANS   Final    Report Status 02/18/2011 FINAL   Final   CLOSTRIDIUM DIFFICILE BY PCR     Status: Normal   Collection Time   02/17/11  2:39 PM      Component Value Range Status Comment   C difficile by pcr NEGATIVE  NEGATIVE  Final    Studies/Results: Results for orders placed during the hospital encounter of 02/10/11 (from the past 24 hour(s))  BASIC METABOLIC PANEL     Status: Abnormal   Collection Time   02/19/11  5:48 AM      Component Value Range   Sodium 136  135 - 145 (mEq/L)   Potassium 4.3  3.5 - 5.1 (mEq/L)   Chloride 102  96 - 112 (mEq/L)   CO2 25  19 - 32 (mEq/L)   Glucose, Bld 105 (*) 70 - 99  (mg/dL)   BUN 14  6 - 23 (mg/dL)   Creatinine, Ser 1.61 (*) 0.50 - 1.35 (mg/dL)   Calcium 8.6  8.4 - 09.6 (mg/dL)   GFR calc non Af Amer 19 (*) >60 (mL/min)   GFR calc Af Amer 23 (*) >60 (mL/min)  CBC     Status: Abnormal   Collection Time   02/19/11  5:48 AM      Component Value Range   WBC 10.1  4.0 - 10.5 (K/uL)   RBC 4.06 (*) 4.22 - 5.81 (MIL/uL)   Hemoglobin 11.1 (*) 13.0 - 17.0 (g/dL)   HCT 04.5 (*) 40.9 - 52.0 (%)   MCV 83.7  78.0 - 100.0 (fL)   MCH 27.3  26.0 - 34.0 (pg)   MCHC 32.6  30.0 - 36.0 (g/dL)   RDW 81.1 (*) 91.4 - 15.5 (%)   Platelets 271  150 - 400 (K/uL)  PHOSPHORUS     Status: Normal   Collection Time   02/19/11  5:48 AM      Component Value Range   Phosphorus 3.9  2.3 - 4.6 (mg/dL)     Scheduled Meds:   . albuterol  2.5 mg Nebulization QID   And  . ipratropium  0.5 mg Nebulization Q4H  . feeding supplement  237 mL Oral TID WC  . guaiFENesin  600 mg Oral BID  . levothyroxine  62.5 mcg Oral QAC breakfast  . megestrol  200 mg Oral Daily  . moxifloxacin  400 mg Oral q1800  . pantoprazole  40 mg Oral Q1200  . sodium chloride      . DISCONTD: furosemide  80 mg Intravenous BID  . DISCONTD: pantoprazole (PROTONIX) IV  40 mg Intravenous Q24H   Continuous Infusions:   . dextrose 5 % and 0.45 % NaCl with KCl 20 mEq/L 145 mL/hr at 02/19/11 1027   PRN Meds:.acetaminophen, alum & mag hydroxide-simeth, bisacodyl, chlorpheniramine-HYDROcodone, magic mouthwash w/lidocaine, ondansetron (ZOFRAN) IV, ondansetron, phenol, senna-docusate, traMADol Assessment/Plan: Principal Problem:  *PNA (pneumonia)- afebrile  with no leukocytosis on Avelox.  Active Problems:  Acute renal failure-  Continue  IV fluids, also on Lasix per renal, creatinine about the same, appreciate a renal assistance.  Weakness generalized/deconditioning - continue PT , Cortrosyn stimulation test with no evidence of Addison's  S/P hip replacement right - PT following  Protein-calorie malnutrition,  severe. -Megace as recommended per GI, will monitor for any improvement in his by mouth intake.  Abdominal pain - CT scan of the abdomen and pelvis unrevealing, right upper quadrant ultrasound ordered per GI will follow.      LOS: 9 days   Klaudia Beirne C 02/19/2011, 3:37 PM

## 2011-02-20 ENCOUNTER — Inpatient Hospital Stay (HOSPITAL_COMMUNITY): Payer: Medicare Other

## 2011-02-20 LAB — CBC
Hemoglobin: 10.1 g/dL — ABNORMAL LOW (ref 13.0–17.0)
MCH: 26.8 pg (ref 26.0–34.0)
RBC: 3.77 MIL/uL — ABNORMAL LOW (ref 4.22–5.81)

## 2011-02-20 LAB — BASIC METABOLIC PANEL
GFR calc non Af Amer: 20 mL/min — ABNORMAL LOW (ref >60)
Glucose, Bld: 102 mg/dL — ABNORMAL HIGH (ref 70–99)
Potassium: 5 mEq/L (ref 3.5–5.1)
Sodium: 135 mEq/L (ref 135–145)

## 2011-02-20 LAB — PHOSPHORUS: Phosphorus: 5 mg/dL — ABNORMAL HIGH (ref 2.3–4.6)

## 2011-02-20 MED ORDER — DEXTROSE-NACL 5-0.45 % IV SOLN
INTRAVENOUS | Status: DC
  Administered 2011-02-20 – 2011-02-24 (×13): via INTRAVENOUS

## 2011-02-20 MED ORDER — MAGIC MOUTHWASH
10.0000 mL | Freq: Three times a day (TID) | ORAL | Status: DC | PRN
  Administered 2011-02-20 – 2011-02-24 (×6): 10 mL via ORAL
  Filled 2011-02-20 (×2): qty 10
  Filled 2011-02-20 (×3): qty 5
  Filled 2011-02-20 (×2): qty 10
  Filled 2011-02-20: qty 5

## 2011-02-20 MED ORDER — NYSTATIN 100000 UNIT/GM EX POWD
Freq: Four times a day (QID) | CUTANEOUS | Status: DC
  Administered 2011-02-20 – 2011-02-25 (×18): via TOPICAL
  Filled 2011-02-20: qty 15

## 2011-02-20 MED ORDER — ALBUTEROL SULFATE (5 MG/ML) 0.5% IN NEBU
2.5000 mg | INHALATION_SOLUTION | Freq: Four times a day (QID) | RESPIRATORY_TRACT | Status: DC
  Administered 2011-02-20 – 2011-02-23 (×14): 2.5 mg via RESPIRATORY_TRACT
  Filled 2011-02-20 (×16): qty 0.5

## 2011-02-20 MED ORDER — IPRATROPIUM BROMIDE 0.02 % IN SOLN
0.5000 mg | Freq: Four times a day (QID) | RESPIRATORY_TRACT | Status: DC
  Administered 2011-02-20 – 2011-02-23 (×14): 0.5 mg via RESPIRATORY_TRACT
  Filled 2011-02-20 (×16): qty 2.5

## 2011-02-20 MED ORDER — NYSTATIN 100000 UNIT/GM EX POWD
CUTANEOUS | Status: AC
  Filled 2011-02-20: qty 15

## 2011-02-20 NOTE — Progress Notes (Signed)
Subjective: Interval History: has no complaint of shortness of breath or orthopnea. He denies any nausea or vomiting. His appetite is still poor. Patient also complains of her some weakness..  Objective: Vital signs in last 24 hours: Temp:  [98.3 F (36.8 C)-99.1 F (37.3 C)] 98.3 F (36.8 C) (09/23 0619) Pulse Rate:  [76-101] 76  (09/23 0619) Resp:  [18-20] 20  (09/23 0619) BP: (126-155)/(76-91) 142/79 mmHg (09/23 0619) SpO2:  [92 %-97 %] 94 % (09/23 0715) Weight change:   Intake/Output from previous day: 09/22 0701 - 09/23 0700 In: 2280 [P.O.:540; I.V.:1740] Out: 1925 [Urine:1925] Intake/Output this shift:    General appearance: alert Resp: clear to auscultation bilaterally Cardio: regular rate and rhythm, S1, S2 normal, no murmur, click, rub or gallop GI: soft, non-tender; bowel sounds normal; no masses,  no organomegaly Extremities: extremities normal, atraumatic, no cyanosis or edema  Lab Results:  [facility] 02/20/11 0552 02/19/11 0548  WBC 7.7 10.1  HGB 10.1* 11.1*  HCT 31.7* 34.0*  PLT 243 271   BMET:  Basename 02/20/11 0552 02/19/11 0548  NA 135 136  K 5.0 4.3  CL 102 102  CO2 26 25  GLUCOSE 102* 105*  BUN 15 14  CREATININE 3.07* 3.13*  CALCIUM 8.5 8.6   No results found for this basename: PTH:2 in the last 72 hours Iron Studies: No results found for this basename: IRON,TIBC,TRANSFERRIN,FERRITIN in the last 72 hours  Studies/Results: No results found.  I have reviewed the patient's current medications.  Assessment/Plan: Problem #1 renal failure at this moment seems to be acute kidney injury superimposed on chronic renal failure . The etiology for his acute renal failure thought to be secondary to prerenal syndrome versus ATN. Presently he is none oliguric. His pending creatinine slightly improving. Problem #2 hypokalemia is on potassium potassium is 5 has corrected. Problem #3 bilateral pneumonia he is a febrile and his white cell count is normal he is  on antibiotics. Problem #4 history of  hypothyroidism is on Synthroid Problem #5 history of anemia his hemoglobin seems to be declining. Possibly combination of  iron deficiency and anemia of chronic disease. Problem #6 history of dysphasia Problem #7 history of  hip fracture status post surgery Problem #8 history of protein calorie malnutrition patient is still with poor appetite. Recommendation we'll DC the potassium from the IV fluid and will continue with other medications as before We'll check his basic metabolic panel and CBC in the morning. We'll check also his phosphorus.     LOS: 10 days   Maynor Mwangi S 02/20/2011,8:53 AM

## 2011-02-20 NOTE — Progress Notes (Signed)
Subjective: Good uop.  Refusing to go back to rehab now and wants to go home instead.  Only active med issue is renal fail which is slowly improving. Objective: Vital signs in last 24 hours: Filed Vitals:   02/19/11 1946 02/19/11 2130 02/20/11 0619 02/20/11 0715  BP:  126/76 142/79   Pulse:  88 76   Temp:  99.1 F (37.3 C) 98.3 F (36.8 C)   TempSrc:  Oral Oral   Resp:  18 20   Height:      Weight:      SpO2: 96% 92% 97% 94%   Weight change:   Intake/Output Summary (Last 24 hours) at 02/20/11 1610 Last data filed at 02/20/11 0700  Gross per 24 hour  Intake   2280 ml  Output   1925 ml  Net    355 ml   BP 142/79  Pulse 76  Temp(Src) 98.3 F (36.8 C) (Oral)  Resp 20  Ht 5\' 10"  (1.778 m)  Wt 64.8 kg (142 lb 13.7 oz)  BMI 20.50 kg/m2  SpO2 94%  General Appearance:    Alert, cooperative, no distress, appears stated age  Head:    Normocephalic, without obvious abnormality, atraumatic  Eyes:    PERRL, conjunctiva/corneas clear, EOM's intact, fundi    benign, both eyes       Ears:    Normal TM's and external ear canals, both ears  Nose:   Nares normal, septum midline, mucosa normal, no drainage    or sinus tenderness  Throat:   Lips, mucosa, and tongue normal; teeth and gums normal  Neck:   Supple, symmetrical, trachea midline, no adenopathy;       thyroid:  No enlargement/tenderness/nodules; no carotid   bruit or JVD  Back:     Symmetric, no curvature, ROM normal, no CVA tenderness  Lungs:     Clear to auscultation bilaterally, respirations unlabored  Chest wall:    No tenderness or deformity  Heart:    Regular rate and rhythm, S1 and S2 normal, no murmur, rub   or gallop  Abdomen:     Soft, non-tender, bowel sounds active all four quadrants,    no masses, no organomegaly  Genitalia:    Normal male without lesion, discharge or tenderness  Rectal:    Normal tone, normal prostate, no masses or tenderness;   guaiac negative stool  Extremities:   Extremities normal,  atraumatic, no cyanosis or edema  Pulses:   2+ and symmetric all extremities  Skin:   Skin color, texture, turgor normal, no rashes or lesions  Lymph nodes:   Cervical, supraclavicular, and axillary nodes normal  Neurologic:   CNII-XII intact. Normal strength, sensation and reflexes      throughout   Lab Results: Admission on 02/10/2011  No results displayed because visit has over 200 results.     Micro Results: Recent Results (from the past 240 hour(s))  URINE CULTURE     Status: Normal   Collection Time   02/10/11  7:46 PM      Component Value Range Status Comment   Specimen Description URINE, CLEAN CATCH   Final    Special Requests NONE   Final    Setup Time [id number]   Final    Colony Count NO GROWTH   Final    Culture NO GROWTH   Final    Report Status 02/12/2011 FINAL   Final   CULTURE, SPUTUM-ASSESSMENT     Status: Normal   Collection Time  02/11/11  2:08 AM      Component Value Range Status Comment   Specimen Description SPU EXPECTORATED   Final    Special Requests NONE   Final    Sputum evaluation     Final    Value: MICROSCOPIC FINDINGS SUGGEST THAT THIS SPECIMEN IS NOT REPRESENTATIVE OF LOWER RESPIRATORY SECRETIONS. PLEASE RECOLLECT.     Results Called to: TRACY,RN AT 0428 ON 02/11/2011 BY MILES,A.     Performed at [facility]   Report Status 02/14/2011 FINAL   Final   CULTURE, SPUTUM-ASSESSMENT     Status: Normal   Collection Time   02/11/11  5:30 AM      Component Value Range Status Comment   Specimen Description SPUTUM   Final    Special Requests Normal   Final    Sputum evaluation     Final    Value: MICROSCOPIC FINDINGS SUGGEST THAT THIS SPECIMEN IS NOT REPRESENTATIVE OF LOWER RESPIRATORY SECRETIONS. PLEASE RECOLLECT.     Results Called to: MAVIS,J. AT 0915 ON 02/11/2011 BY RESSEGGER,R.    Report Status 02/11/2011 FINAL   Final   CULTURE, SPUTUM-ASSESSMENT     Status: Normal   Collection Time   02/16/11  5:03 AM      Component Value Range Status  Comment   Specimen Description SPUTUM EXPECTORATED   Final    Special Requests NONE   Final    Sputum evaluation     Final    Value: THIS SPECIMEN IS ACCEPTABLE. RESPIRATORY CULTURE REPORT TO FOLLOW.     Performed at [facility]   Report Status 02/16/2011 FINAL   Final   CULTURE, RESPIRATORY     Status: Normal   Collection Time   02/16/11  5:03 AM      Component Value Range Status Comment   Specimen Description SPUTUM EXPECTORATED   Final    Special Requests NONE   Final    Gram Stain     Final    Value: ABUNDANT WBC PRESENT, PREDOMINANTLY PMN     ABUNDANT SQUAMOUS EPITHELIAL CELLS PRESENT     RARE YEAST WITH PSEUDOHYPHAE   Culture MODERATE CANDIDA ALBICANS   Final    Report Status 02/18/2011 FINAL   Final   CLOSTRIDIUM DIFFICILE BY PCR     Status: Normal   Collection Time   02/17/11  2:39 PM      Component Value Range Status Comment   C difficile by pcr NEGATIVE  NEGATIVE  Final    Studies/Results: No results found. Scheduled Meds:   . ipratropium  0.5 mg Nebulization QID   And  . albuterol  2.5 mg Nebulization QID  . feeding supplement  237 mL Oral TID WC  . guaiFENesin  600 mg Oral BID  . levothyroxine  62.5 mcg Oral QAC breakfast  . megestrol  200 mg Oral Daily  . moxifloxacin  400 mg Oral q1800  . pantoprazole  40 mg Oral Q1200  . sodium chloride      . DISCONTD: albuterol  2.5 mg Nebulization QID  . DISCONTD: ipratropium  0.5 mg Nebulization Q4H  . DISCONTD: pantoprazole (PROTONIX) IV  40 mg Intravenous Q24H   Continuous Infusions:   . dextrose 5 % and 0.45% NaCl    . DISCONTD: dextrose 5 % and 0.45 % NaCl with KCl 20 mEq/L 145 mL/hr at 02/20/11 0722   PRN Meds:.acetaminophen, alum & mag hydroxide-simeth, bisacodyl, chlorpheniramine-HYDROcodone, magic mouthwash w/lidocaine, ondansetron (ZOFRAN) IV, ondansetron, phenol, senna-docusate, traMADol  Assessment/Plan: Principal Problem:  *PNA (pneumonia)  Resolving cont po abx. Active Problems:  Dysphagia   Anemia  Weakness generalized  S/P hip replacement right  Pain, joint, hip, right  Hypokalemia  Protein-calorie malnutrition, severe  Acute renal failure  Improving, ivf/lasix adjustments per nephro.  Likely d/c next couple of days  He will consider another rehab center.  Do not think good idea to go home but will be left up to him and his wife.   LOS: 10 days   Tenaya Hilyer A 02/20/2011, 9:26 AM

## 2011-02-20 NOTE — Progress Notes (Signed)
Subjective: No specific complaints this morning. Just got back from ultrasound. Currently, patient denies abdominal pain. Also denies mouth pain. Magic mouthwash started yesterday.  Objective: Vital signs in last 24 hours: Temp:  [98.3 F (36.8 C)-99.1 F (37.3 C)] 98.3 F (36.8 C) (09/23 0619) Pulse Rate:  [76-101] 76  (09/23 0619) Resp:  [18-20] 20  (09/23 0619) BP: (126-155)/(76-91) 142/79 mmHg (09/23 0619) SpO2:  [92 %-97 %] 94 % (09/23 0715) Last BM Date: 02/17/11 General:   Alert,  Frail chronically ill appearing individual in NAD Abdomen:  Positive bowel sounds Soft, and nondistended.  Normal bowel sounds, without guarding, and without rebound.  No mass or organomegaly.  Right upper quadrant tenderness to palpation minimal today Extremities:  Without clubbing or edema.    Intake/Output from previous day: 09/22 0701 - 09/23 0700 In: 2280 [P.O.:540; I.V.:1740] Out: 1925 [Urine:1925] Intake/Output this shift: Total I/O In: 200 [P.O.:200] Out: 200 [Urine:200]  Lab Results:  [facility] 02/20/11 0552 02/19/11 0548  WBC 7.7 10.1  HGB 10.1* 11.1*  HCT 31.7* 34.0*  PLT 243 271   BMET  Basename 02/20/11 0552 02/19/11 0548 02/18/11 0947  NA 135 136 136  K 5.0 4.3 4.1  CL 102 102 105  CO2 26 25 23   GLUCOSE 102* 105* 114*  BUN 15 14 15   CREATININE 3.07* 3.13* 3.08*  CALCIUM 8.5 8.6 8.3*   Preliminary report of ultrasound: Gallbladder looked okay. Old granulomatous disease in liver as seen on CT. AAA 3 cm. Common bile duct slightly dilated at 9.3 cm but probably okay for age.   Assessment:   Overall Mr. Ostermiller remains stable;  GI symptoms diminished today. Gallbladder is sonographically normal. No obvious GI bleeding. Minimal improvement in creatinine.  Recommendations:    Continue supportive care. Would complete a five-day course of Magic mouthwash. Further recommendations per Dr. Karilyn Cota in regards to future GI evaluation. Eula Listen  02/20/2011, 11:00 AM

## 2011-02-21 ENCOUNTER — Inpatient Hospital Stay (HOSPITAL_COMMUNITY): Payer: Medicare Other

## 2011-02-21 LAB — BASIC METABOLIC PANEL WITH GFR
BUN: 16 mg/dL (ref 6–23)
CO2: 23 meq/L (ref 19–32)
Calcium: 8.1 mg/dL — ABNORMAL LOW (ref 8.4–10.5)
Chloride: 102 meq/L (ref 96–112)
Creatinine, Ser: 2.9 mg/dL — ABNORMAL HIGH (ref 0.50–1.35)
GFR calc Af Amer: 25 mL/min — ABNORMAL LOW
GFR calc non Af Amer: 21 mL/min — ABNORMAL LOW
Glucose, Bld: 94 mg/dL (ref 70–99)
Potassium: 3.6 meq/L (ref 3.5–5.1)
Sodium: 135 meq/L (ref 135–145)

## 2011-02-21 LAB — UIFE/LIGHT CHAINS/TP QN, 24-HR UR
Albumin, U: DETECTED
Free Lambda Lt Chains,Ur: 1.82 mg/dL — ABNORMAL HIGH (ref 0.02–0.67)
Gamma Globulin, Urine: NOT DETECTED

## 2011-02-21 LAB — CBC
HCT: 30.5 % — ABNORMAL LOW (ref 39.0–52.0)
Hemoglobin: 10.1 g/dL — ABNORMAL LOW (ref 13.0–17.0)
MCH: 27.4 pg (ref 26.0–34.0)
MCHC: 33.1 g/dL (ref 30.0–36.0)
MCV: 82.9 fL (ref 78.0–100.0)
Platelets: 225 10*3/uL (ref 150–400)
RBC: 3.68 MIL/uL — ABNORMAL LOW (ref 4.22–5.81)
RDW: 16.8 % — ABNORMAL HIGH (ref 11.5–15.5)
WBC: 8 10*3/uL (ref 4.0–10.5)

## 2011-02-21 MED ORDER — PRO-STAT SUGAR FREE PO LIQD
30.0000 mL | Freq: Three times a day (TID) | ORAL | Status: DC
  Administered 2011-02-21 – 2011-02-25 (×4): 30 mL via ORAL
  Filled 2011-02-21 (×5): qty 30

## 2011-02-21 NOTE — Progress Notes (Signed)
02/21/11 1045 CONVERSATION WITH PATIENT'S WIFE AND FAMILY RE: DISPOSITION, WIFE REQUESTS [facility], DOES NOT WISH FOR PATIENT TO GO TO [facility], CURRENTLY AWAITING ANOTHER PT EVAL AS WIFE REPORTS PATIENT'S MOBILITY HAS DECLINED. PATIENT IS ALSO SCHED FOR MBS TODAY. SW NOTIFIED OF REFERRAL. Ruberta Holck RN,BSN,CCM [phone] PAGER.

## 2011-02-21 NOTE — Progress Notes (Signed)
Subjective: Agrees to other rehab centers.  Appetite still poor. Objective: Vital signs in last 24 hours: Filed Vitals:   02/20/11 2209 02/21/11 0619 02/21/11 0659 02/21/11 0701  BP: 145/75 132/73    Pulse: 101 97    Temp: 99.2 F (37.3 C) 98 F (36.7 C)    TempSrc: Oral Oral    Resp: 18 18    Height:      Weight:      SpO2: 95% 90% 96% 95%   Weight change:   Intake/Output Summary (Last 24 hours) at 02/21/11 0959 Last data filed at 02/21/11 1610  Gross per 24 hour  Intake    240 ml  Output   2150 ml  Net  -1910 ml   BP 132/73  Pulse 97  Temp(Src) 98 F (36.7 C) (Oral)  Resp 18  Ht 5\' 10"  (1.778 m)  Wt 64.8 kg (142 lb 13.7 oz)  BMI 20.50 kg/m2  SpO2 95%  General Appearance:    Alert, cooperative, no distress, appears stated age  Head:    Normocephalic, without obvious abnormality, atraumatic  Eyes:    PERRL, conjunctiva/corneas clear, EOM's intact, fundi    benign, both eyes       Ears:    Normal TM's and external ear canals, both ears  Nose:   Nares normal, septum midline, mucosa normal, no drainage    or sinus tenderness  Throat:   Lips, mucosa, and tongue normal; teeth and gums normal  Neck:   Supple, symmetrical, trachea midline, no adenopathy;       thyroid:  No enlargement/tenderness/nodules; no carotid   bruit or JVD  Back:     Symmetric, no curvature, ROM normal, no CVA tenderness  Lungs:     Clear to auscultation bilaterally, respirations unlabored  Chest wall:    No tenderness or deformity  Heart:    Regular rate and rhythm, S1 and S2 normal, no murmur, rub   or gallop  Abdomen:     Soft, non-tender, bowel sounds active all four quadrants,    no masses, no organomegaly        Extremities:   Extremities normal, atraumatic, no cyanosis or edema  Pulses:   2+ and symmetric all extremities  Skin:   Skin color, texture, turgor normal, no rashes or lesions  Lymph nodes:   Cervical, supraclavicular, and axillary nodes normal  Neurologic:   CNII-XII intact.  Normal strength, sensation and reflexes      throughout   Lab Results: Admission on 02/10/2011  No results displayed because visit has over 200 results.     Micro Results: Recent Results (from the past 240 hour(s))  CULTURE, SPUTUM-ASSESSMENT     Status: Normal   Collection Time   02/16/11  5:03 AM      Component Value Range Status Comment   Specimen Description SPUTUM EXPECTORATED   Final    Special Requests NONE   Final    Sputum evaluation     Final    Value: THIS SPECIMEN IS ACCEPTABLE. RESPIRATORY CULTURE REPORT TO FOLLOW.     Performed at [facility]   Report Status 02/16/2011 FINAL   Final   CULTURE, RESPIRATORY     Status: Normal   Collection Time   02/16/11  5:03 AM      Component Value Range Status Comment   Specimen Description SPUTUM EXPECTORATED   Final    Special Requests NONE   Final    Gram Stain  Final    Value: ABUNDANT WBC PRESENT, PREDOMINANTLY PMN     ABUNDANT SQUAMOUS EPITHELIAL CELLS PRESENT     RARE YEAST WITH PSEUDOHYPHAE   Culture MODERATE CANDIDA ALBICANS   Final    Report Status 02/18/2011 FINAL   Final   CLOSTRIDIUM DIFFICILE BY PCR     Status: Normal   Collection Time   02/17/11  2:39 PM      Component Value Range Status Comment   C difficile by pcr NEGATIVE  NEGATIVE  Final    Studies/Results: US Abdomen Complete  02/20/2011  *RADIOLOGY REPORT*  Clinical Data:  Right upper abdominal pain  COMPLETE ABDOMINAL ULTRASOUND  Comparison:  CT 02/17/2011 and earlier studies  Findings:  Gallbladder:  No gallstones, gallbladder wall thickening, or pericholecystic fluid. Sonographer reports no sonographic Murphy's sign.  Common bile duct:  9.3 mm in diameter, without evidence of choledocholithiasis.  Liver:  Focal calcification in the posterior right hepatic segment corresponding granuloma on prior CT.  No new lesion is evident.  No intrahepatic biliary ductal dilatation.  Background parenchyma unremarkable.  IVC:  Appears normal.  Pancreas:  No  focal abnormality seen.  Spleen:  6.9 cm craniocaudal length., unremarkable  Right Kidney:  10.3 cm.  No hydronephrosis.  15 x 17 x 20 mm cyst in the     interpolar region, relatively stable since scans dating back to 07/02/2005.  Left Kidney:  12.3 cm.  No hydronephrosis.  3.7 cm parapelvic cyst stable.  Abdominal aorta:  Atheromatous, dilated up to 3.1 cm.  Right common iliac artery measures up to 14 mm diameter.  IMPRESSION:  1.  Normal gallbladder. 2.  Stable small infrarenal aortic aneurysm. 3.  Stable bilateral renal cysts.  Original Report Authenticated By: Thora Lance III, M.D.   Scheduled Meds:   . ipratropium  0.5 mg Nebulization QID   And  . albuterol  2.5 mg Nebulization QID  . feeding supplement  237 mL Oral TID WC  . guaiFENesin  600 mg Oral BID  . levothyroxine  62.5 mcg Oral QAC breakfast  . megestrol  200 mg Oral Daily  . moxifloxacin  400 mg Oral q1800  . nystatin   Topical QID  . pantoprazole  40 mg Oral Q1200   Continuous Infusions:   . dextrose 5 % and 0.45% NaCl 135 mL/hr at 02/21/11 0254   PRN Meds:.acetaminophen, alum & mag hydroxide-simeth, bisacodyl, chlorpheniramine-HYDROcodone, magic mouthwash, ondansetron (ZOFRAN) IV, ondansetron, phenol, senna-docusate, traMADol, DISCONTD: magic mouthwash w/lidocaine Assessment/Plan: Principal Problem:  *PNA (pneumonia)  Cont avelox. Active Problems:  Dysphagia  Barium eval today per gi.  Anemia  stable  Weakness generalized  S/P hip replacement right  Pain, joint, hip, right  Hypokalemia  Protein-calorie malnutrition, severe  Acute renal failure  Slowly improving.  Cont ivf.  Needs aggressive pt/rehab.  Wife will start looking at other rehabs besides [facility].   LOS: 11 days   Jorge Nixon A 02/21/2011, 9:59 AM

## 2011-02-21 NOTE — Progress Notes (Signed)
Subjective: Interval History: has no complaint of nausea or vomiting. Patient still with a poor appetite. He denies any shortness of breath orthopnea or paroxysmal nocturnal dyspnea. According to his wife patient still is very weak and the difficulty walking..  Objective: Vital signs in last 24 hours: Temp:  [98 F (36.7 C)-99.2 F (37.3 C)] 98 F (36.7 C) (09/24 0619) Pulse Rate:  [97-104] 97  (09/24 0619) Resp:  [18-20] 18  (09/24 0619) BP: (119-145)/(73-81) 132/73 mmHg (09/24 0619) SpO2:  [90 %-96 %] 95 % (09/24 0701) Weight change:   Intake/Output from previous day: 09/23 0701 - 09/24 0700 In: 440 [P.O.:440] Out: 2350 [Urine:2350] Intake/Output this shift:    General appearance: alert Resp: clear to auscultation bilaterally Cardio: regular rate and rhythm, S1, S2 normal, no murmur, click, rub or gallop GI: soft, non-tender; bowel sounds normal; no masses,  no organomegaly Extremities: extremities normal, atraumatic, no cyanosis or edema  Lab Results:  [facility] 02/21/11 0525 02/20/11 0552  WBC 8.0 7.7  HGB 10.1* 10.1*  HCT 30.5* 31.7*  PLT 225 243   BMET:  Basename 02/21/11 0525 02/20/11 0552  NA 135 135  K 3.6 5.0  CL 102 102  CO2 23 26  GLUCOSE 94 102*  BUN 16 15  CREATININE 2.90* 3.07*  CALCIUM 8.1* 8.5   No results found for this basename: PTH:2 in the last 72 hours Iron Studies: No results found for this basename: IRON,TIBC,TRANSFERRIN,FERRITIN in the last 72 hours  Studies/Results: US Abdomen Complete  02/20/2011  *RADIOLOGY REPORT*  Clinical Data:  Right upper abdominal pain  COMPLETE ABDOMINAL ULTRASOUND  Comparison:  CT 02/17/2011 and earlier studies  Findings:  Gallbladder:  No gallstones, gallbladder wall thickening, or pericholecystic fluid. Sonographer reports no sonographic Murphy's sign.  Common bile duct:  9.3 mm in diameter, without evidence of choledocholithiasis.  Liver:  Focal calcification in the posterior right hepatic segment corresponding  granuloma on prior CT.  No new lesion is evident.  No intrahepatic biliary ductal dilatation.  Background parenchyma unremarkable.  IVC:  Appears normal.  Pancreas:  No focal abnormality seen.  Spleen:  6.9 cm craniocaudal length., unremarkable  Right Kidney:  10.3 cm.  No hydronephrosis.  15 x 17 x 20 mm cyst in the     interpolar region, relatively stable since scans dating back to 07/02/2005.  Left Kidney:  12.3 cm.  No hydronephrosis.  3.7 cm parapelvic cyst stable.  Abdominal aorta:  Atheromatous, dilated up to 3.1 cm.  Right common iliac artery measures up to 14 mm diameter.  IMPRESSION:  1.  Normal gallbladder. 2.  Stable small infrarenal aortic aneurysm. 3.  Stable bilateral renal cysts.  Original Report Authenticated By: Osa Craver, M.D.    I have reviewed the patient's current medications.  Assessment/Plan: Problem #1 renal failure acute kidney injury superimposed on chronic pending creatinine has slowly improving . He doesn't have any nausea or vomiting her. Problem #2 history of hypokalemia his potassium still normal. Problem #3 history of pneumonia patient is a febrile with normal white blood cell count. Problem #4 history of  hip surgery complains of some weakness Problem #5 history of  dysphagia Problem #6 anemia H&H is stable Problem #7 history of  poor nutrition presently he is on a sure. Recommendation we'll continue his hydration we'll check his basic metabolic panel in the morning. This is end of her followup thank you    LOS: 11 days   Jorge Nixon S 02/21/2011,8:26 AM

## 2011-02-21 NOTE — Progress Notes (Signed)
Physical Therapy Treatment Patient Name: Jorge Nixon Date: 02/21/2011 Problem List:  Patient Active Problem List  Diagnoses  . PNA (pneumonia)  . Dysphagia  . Anemia  . Weakness generalized  . S/P hip replacement right  . Pain, joint, hip, right  . Hypokalemia  . Protein-calorie malnutrition, severe  . Acute renal failure   Past Medical History:  Past Medical History  Diagnosis Date  . Hypertension   . Diverticular disease 02/14/2011    problems swallowing  . GERD (gastroesophageal reflux disease)   . Coronary artery disease   . Kidney stone   . Hypothyroidism   . Pneumonia   . Anemia    Past Surgical History:  Past Surgical History  Procedure Date  . Total hip arthroplasty august 6th 2012    right side   . Replacement total knee 2006    left knee  . Transurethral resection of prostate 1994 or 1995   Precautions/Restrictions  Precautions Precautions: Anterior Hip Restrictions Weight Bearing Restrictions: No Mobility (including Balance)      Exercise     End of Session   PT Assessment/Plan  Attempted therapy today;respiratory was present admin breathing treatment and tech on way to take pt down for xray;will attempt at a later time PT Goals     Jorge Nixon 02/21/2011, 11:10 AM

## 2011-02-21 NOTE — Progress Notes (Signed)
Follow-up- ADULT NUTRITION ASSESSMENT Date: 02/21/2011   Time: 11:57 AM Reason for Assessment: Poor appetite, FTT  ASSESSMENT: Male 77 y.o.  Dx: PNA (pneumonia)  Past Medical History  Diagnosis Date  . Hypertension   . Diverticular disease 02/14/2011    problems swallowing  . GERD (gastroesophageal reflux disease)   . Coronary artery disease   . Kidney stone   . Hypothyroidism   . Pneumonia   . Anemia     Scheduled Meds:    . ipratropium  0.5 mg Nebulization QID   And  . albuterol  2.5 mg Nebulization QID  . feeding supplement  237 mL Oral TID WC  . guaiFENesin  600 mg Oral BID  . levothyroxine  62.5 mcg Oral QAC breakfast  . megestrol  200 mg Oral Daily  . moxifloxacin  400 mg Oral q1800  . nystatin   Topical QID  . pantoprazole  40 mg Oral Q1200   Continuous Infusions:    . dextrose 5 % and 0.45% NaCl 135 mL/hr at 02/21/11 1100   PRN Meds:.acetaminophen, alum & mag hydroxide-simeth, bisacodyl, chlorpheniramine-HYDROcodone, magic mouthwash, ondansetron (ZOFRAN) IV, ondansetron, phenol, senna-docusate, traMADol, DISCONTD: magic mouthwash w/lidocaine  Ht: 5\' 10"  (177.8 cm)  Wt: 142 lb 13.7 oz (64.8 kg)  Ideal Wt: 68.5 kg 73 kg (161#) % Ideal Wt: 89%  Usual Wt: 145-150# % Usual Wt: 98%  Body mass index is 20.50 kg/(m^2).  Food/Nutrition Related Hx: Pt reports decr appetite since hip replacement August 6th. Also has had recent swallow difficulty. Bedside swallow study completed and MBSS pending. He says that his nausea is better currently.  He is tol Dys 3 diet with thin liquids. Reports to have eaten ~50% of his lunch but appetite still not at it's usual level. He denies significant wt loss; states  that the most he has ever weighed is 150#. He reports to be unable to tol oral suppl such as Ensure but is amiable to try [employer] and ProStat to assist with meeting nutritional needs given his poor appetite. I suspect he is at least moderately malnourished even  though he denies significant wt loss. 02-16-11 Pt says his stomach is hurting currently. He spouse is here and says that he's a "very picky" eater. He did walk earlier and ate 75% breakfast. At lunch he had jello, soup and juice. He doesn't like the [facility] suppl; will d/c. Inadequate oral intake cont. but he is willing to try to incr protein-type foods. Therefore, we'll send cottage cheese and soft fruit for snack in evening and double portion of eggs daily with breakfast. 02-21-11 Pt up in chair today he says his stomach is feeling better but his throat and tongue are very sore. He is receiving Magic Mouthwash. He is scheduled for MBSS today. His diet has changed to Lactose Restricted. He ate better yesterday 40% lunch and 90% dinner and today he ate 100% breakfast per nursing. His wife has been bringing Ensure Clear in since pt doesn't like the comparable [employer] product. Cottage cheese d/c with Lactose Restriction. Inadequate oral intake slowly improving. Will follow results of MBSS. Rec consider trial of puree foods due to mouth pain.  BMET    Component Value Date/Time   NA 135 02/21/2011 0525   K 3.6 02/21/2011 0525   CL 102 02/21/2011 0525   CO2 23 02/21/2011 0525   GLUCOSE 94 02/21/2011 0525   BUN 16 02/21/2011 0525   CREATININE 2.90* 02/21/2011 0525   CALCIUM 8.1* 02/21/2011 0525  GFRNONAA 21* 02/21/2011 0525   GFRAA 25* 02/21/2011 0525   CBC    Component Value Date/Time   WBC 8.0 02/21/2011 0525   RBC 3.68* 02/21/2011 0525   HGB 10.1* 02/21/2011 0525   HCT 30.5* 02/21/2011 0525   PLT 225 02/21/2011 0525   MCV 82.9 02/21/2011 0525   MCH 27.4 02/21/2011 0525   MCHC 33.1 02/21/2011 0525   RDW 16.8* 02/21/2011 0525   LYMPHSABS 1.0 02/10/2011 1541   MONOABS 0.7 02/10/2011 1541   EOSABS 0.1 02/10/2011 1541   BASOSABS 0.0 02/10/2011 1541     I/O last 3 completed shifts: In: 2480 [P.O.:740; I.V.:1740] Out: 3550 [Urine:3550]  Diet Order: Lactose Restricted  Supplements/Tube Feeding:none  at this time  IVF:     dextrose 5 % and 0.45% NaCl Last Rate: 135 mL/hr at 02/21/11 1100    Estimated Nutritional Needs:   Kcal:1950-2145 Protein:72-85 grams Fluid:2.0-2.2 L/d  NUTRITION DIAGNOSIS: -Inadequate oral intake (NI-2.1).  Status: Ongoing  RELATED TO:  -decreased appetite  AS EVIDENCE BY:  -Pt report and observed meal intake 0-50% po's, FTT  MONITORING/EVALUATION(Goals): -Pt will improve oral intake to meet >75% of est energy and protein needs. Goal not met. -He will maintain current wt ~143#. -Monitor po's, labs, wts and results of MBSS  EDUCATION NEEDS: -Education not appropriate at this time  INTERVENTION: -ProStat 30 ml TID between meals  02-16-11 -D/C Breeze BID; pt doesn't like the taste. -Double portion eggs daily for increased protein. -Cottage cheese and soft fruit. D/c  02-21-11 -Megace for appetite -Follow MBSS results -Cont ProStat TID -Trial of Puree foods due to mouth pain?   Dietitian [phone]  DOCUMENTATION CODES Per approved criteria  -Non-severe (moderate) malnutrition in the context of acute illness or injury    Francene Boyers 02/21/2011, 11:57 AM

## 2011-02-21 NOTE — Progress Notes (Signed)
Subjective; patient's wife states that her husband cannot swallow food. Patient denies painful swallowing. His cough has decreased. He is trying to drink nutritional supplement. He has been getting Magic mouth wash for soreness in his mouth. He is not complaining of abdominal pain today Objective;  BP 132/73  Pulse 97  Temp(Src) 98 F (36.7 C) (Oral)  Resp 18  Ht 5\' 10"  (1.778 m)  Wt 142 lb 13.7 oz (64.8 kg)  BMI 20.50 kg/m2  SpO2 95% Patient is sitting in a recliner and he is chewing on food. After chewing food several times he is able to swallow but appears to be uncomfortable. No coughing spells noted. Lab data; WBC 8.0, hemoglobin 10.1, hematocrit 30.5, platelet 225K Serum sodium 135, potassium 3.6, chloride 102, CO2 23, glucose 94, BUN 16, creatinine 2.90, Assessment; GI issues are as follows; #1. Dysphagia; he had EGD/ED last week and felt to have esophageal web. He also could have esophageal motility disorder. His dysphagia apparently started following hip surgery last month. Will evaluate this symptom with barium study. He may also need evaluation by speech pathologist. #2. Anemia. He was noted to have heme-positive stools on admission. He has received 2 units of PRBCs and hemoglobin is is gradually drifting down but no evidence of active bleeding. Not in any condition to undergo colonoscopy at this time. #3. Malnutrition secondary to acute illness as well as anorexia and dysphagia. Recommendations; Barium pill esophagogram to be performed later today.

## 2011-02-21 NOTE — Progress Notes (Signed)
I have reviewed barium study done earlier today suggests esophageal motility disorder barium pill at distal esophagus before passing distally. No abnormality noted in the proximal esophagus. I have reviewed these findings with the patient and his wife. Will request a evaluation by Ms. Havery Moros of speech pathology.

## 2011-02-22 LAB — CLOSTRIDIUM DIFFICILE BY PCR: Toxigenic C. Difficile by PCR: NEGATIVE

## 2011-02-22 LAB — BASIC METABOLIC PANEL
CO2: 22 mEq/L (ref 19–32)
Calcium: 8.2 mg/dL — ABNORMAL LOW (ref 8.4–10.5)
Chloride: 103 mEq/L (ref 96–112)
Glucose, Bld: 105 mg/dL — ABNORMAL HIGH (ref 70–99)
Potassium: 3.5 mEq/L (ref 3.5–5.1)
Sodium: 135 mEq/L (ref 135–145)

## 2011-02-22 MED ORDER — SODIUM CHLORIDE 0.9 % IJ SOLN
INTRAMUSCULAR | Status: AC
  Administered 2011-02-22: 10 mL
  Filled 2011-02-22: qty 10

## 2011-02-22 MED ORDER — POTASSIUM CHLORIDE CRYS ER 20 MEQ PO TBCR
40.0000 meq | EXTENDED_RELEASE_TABLET | Freq: Once | ORAL | Status: AC
  Administered 2011-02-22: 40 meq via ORAL
  Filled 2011-02-22: qty 2

## 2011-02-22 NOTE — Progress Notes (Signed)
Patient reports he is able to swallow better today. He's been drinking Nepro and other nutritional supplements not eating much from his meal trays. He continues to complain of having poor appetite. He denies nausea or vomiting. He was evaluated met by Ms. Hale Bogus of speech and language pathology and bedside assessment completed. Patient is also receiving physical therapy and was able to walk some today. His wife is very concerned about him going home and the fact that she cannot manage him. He tells me that he cannot go back to nursing home for rehabilitation. His renal function is improving as evidenced by drop in his serum creatinine 2.71. He still complains of migratory abdominal pain which I believe is secondary to Avelox. Will change his diet to D3/mechanical soft diet.

## 2011-02-22 NOTE — Progress Notes (Signed)
Subjective: Actually doing better today, feels better.  Wife says not coughing as much when he eats.   Objective: Vital signs in last 24 hours: Filed Vitals:   02/21/11 1509 02/21/11 2222 02/22/11 0554 02/22/11 0725  BP:  119/70 112/61   Pulse:  87 77   Temp:  99 F (37.2 C) 98.7 F (37.1 C)   TempSrc:  Oral Oral   Resp:  20 20   Height:      Weight:      SpO2: 97% 95% 92% 93%   Weight change:   Intake/Output Summary (Last 24 hours) at 02/22/11 0821 Last data filed at 02/22/11 0555  Gross per 24 hour  Intake 5857.5 ml  Output   1750 ml  Net 4107.5 ml   BP 112/61  Pulse 77  Temp(Src) 98.7 F (37.1 C) (Oral)  Resp 20  Ht 5\' 10"  (1.778 m)  Wt 64.8 kg (142 lb 13.7 oz)  BMI 20.50 kg/m2  SpO2 93% General appearance: alert, cooperative and no distress Lungs: clear to auscultation bilaterally Heart: regular rate and rhythm, S1, S2 normal, no murmur, click, rub or gallop Abdomen: soft, non-tender; bowel sounds normal; no masses,  no organomegaly Extremities: extremities normal, atraumatic, no cyanosis or edema Lab Results: Admission on 02/10/2011  No results displayed because visit has over 200 results.     Micro Results: Recent Results (from the past 240 hour(s))  CULTURE, SPUTUM-ASSESSMENT     Status: Normal   Collection Time   02/16/11  5:03 AM      Component Value Range Status Comment   Specimen Description SPUTUM EXPECTORATED   Final    Special Requests NONE   Final    Sputum evaluation     Final    Value: THIS SPECIMEN IS ACCEPTABLE. RESPIRATORY CULTURE REPORT TO FOLLOW.     Performed at [facility]   Report Status 02/16/2011 FINAL   Final   CULTURE, RESPIRATORY     Status: Normal   Collection Time   02/16/11  5:03 AM      Component Value Range Status Comment   Specimen Description SPUTUM EXPECTORATED   Final    Special Requests NONE   Final    Gram Stain     Final    Value: ABUNDANT WBC PRESENT, PREDOMINANTLY PMN     ABUNDANT SQUAMOUS EPITHELIAL  CELLS PRESENT     RARE YEAST WITH PSEUDOHYPHAE   Culture MODERATE CANDIDA ALBICANS   Final    Report Status 02/18/2011 FINAL   Final   CLOSTRIDIUM DIFFICILE BY PCR     Status: Normal   Collection Time   02/17/11  2:39 PM      Component Value Range Status Comment   C difficile by pcr NEGATIVE  NEGATIVE  Final    Studies/Results: US Abdomen Complete  02/20/2011  *RADIOLOGY REPORT*  Clinical Data:  Right upper abdominal pain  COMPLETE ABDOMINAL ULTRASOUND  Comparison:  CT 02/17/2011 and earlier studies  Findings:  Gallbladder:  No gallstones, gallbladder wall thickening, or pericholecystic fluid. Sonographer reports no sonographic Murphy's sign.  Common bile duct:  9.3 mm in diameter, without evidence of choledocholithiasis.  Liver:  Focal calcification in the posterior right hepatic segment corresponding granuloma on prior CT.  No new lesion is evident.  No intrahepatic biliary ductal dilatation.  Background parenchyma unremarkable.  IVC:  Appears normal.  Pancreas:  No focal abnormality seen.  Spleen:  6.9 cm craniocaudal length., unremarkable  Right Kidney:  10.3 cm.  No hydronephrosis.  15 x 17 x 20 mm cyst in the     interpolar region, relatively stable since scans dating back to 07/02/2005.  Left Kidney:  12.3 cm.  No hydronephrosis.  3.7 cm parapelvic cyst stable.  Abdominal aorta:  Atheromatous, dilated up to 3.1 cm.  Right common iliac artery measures up to 14 mm diameter.  IMPRESSION:  1.  Normal gallbladder. 2.  Stable small infrarenal aortic aneurysm. 3.  Stable bilateral renal cysts.  Original Report Authenticated By: Osa Craver, M.D.   Dg Esophagus  02/21/2011  *RADIOLOGY REPORT*  Clinical Data: Dysphagia.  Sore throat.  ESOPHOGRAM / BARIUM SWALLOW  Technique:  Combined double contrast and single contrast examination performed using thin barium liquid.  Fluoroscopy time:  3.4 minutes.  Comparison: 02/15/2011 chest CT  Findings:  Primary peristaltic waves in the esophagus were  disrupted on 2/4 swallows, with occasional secondary and tertiary contractions noted.  The patient was unable to stand upright or assume standard RAO positioning.  We performed the exam in the LPO position using thin barium only.  Effervescent crystals were not utilized.  Because the patient was unable to drink quickly, we were unable to fully distend the distal most esophagus.  Series 23 best shows the maximum distended appearance of the esophagus.  A 13 mm barium tablet transiently impacted for about 2 minutes at this level.  We administered water to help clear the tablet, and the patient experience coughing immediately after drinking water, suggesting that he may not be completely protecting his airway.  IMPRESSION:  1.  Primary peristaltic waves were disrupted on 2/4 swallows, borderline for nonspecific esophageal dysmotility.  He did have several episodes of secondary and tertiary contractions of the esophagus. 2.  We were only able to distend the distal esophagus to about 13 mm, and a 13 mm barium tablet transiently impacted in the distal esophagus. However, the patient was only able to piecemeal swallow, and thus the reduced esophageal diameter distally may simply be from an adequate distention rather than true narrowing/stricturing. 3.  The patient did experience coughing immediately after drinking water, raising the possibility that he is not completely protecting his airway.  Original Report Authenticated By: Dellia Cloud, M.D.   Scheduled Meds:   . ipratropium  0.5 mg Nebulization QID   And  . albuterol  2.5 mg Nebulization QID  . feeding supplement  237 mL Oral TID WC  . feeding supplement  30 mL Oral TID WC  . guaiFENesin  600 mg Oral BID  . levothyroxine  62.5 mcg Oral QAC breakfast  . megestrol  200 mg Oral Daily  . moxifloxacin  400 mg Oral q1800  . nystatin   Topical QID  . pantoprazole  40 mg Oral Q1200  . sodium chloride       Continuous Infusions:   . dextrose 5 % and  0.45% NaCl 135 mL/hr at 02/22/11 0231   PRN Meds:.acetaminophen, alum & mag hydroxide-simeth, bisacodyl, chlorpheniramine-HYDROcodone, magic mouthwash, ondansetron (ZOFRAN) IV, ondansetron, phenol, senna-docusate, traMADol Assessment/Plan: Principal Problem:  *PNA (pneumonia)  Improving with avelox po. Active Problems:  Dysphagia  Abnormal barium swallow ordered yesterday by GI.  However dysphagia seems to be improving, further recs per GI.  Anemia  Weakness generalized  S/P hip replacement right  Pain, joint, hip, right  Hypokalemia  Protein-calorie malnutrition, severe  Acute renal failure  Improving slowly daily.  nephro following.  According to PT note from the 19th pt ambulated 300 feet,  however as far as i can tell has not had any PT since then.  Will order DAILY PT as this will determine if he qualifies for further inpt rehab vs home.  D/c once ok with nephro.   LOS: 12 days   Inmer Nix A 02/22/2011, 8:21 AM

## 2011-02-22 NOTE — Progress Notes (Addendum)
Physical Therapy Treatment Patient Name: Jorge Nixon [id number] Date: 02/22/2011 TIME: 905-930/ 1 TE 1 GT Problem List:  Patient Active Problem List  Diagnoses  . PNA (pneumonia)  . Dysphagia  . Anemia  . Weakness generalized  . S/P hip replacement right  . Pain, joint, hip, right  . Hypokalemia  . Protein-calorie malnutrition, severe  . Acute renal failure   Past Medical History:  Past Medical History  Diagnosis Date  . Hypertension   . Diverticular disease 02/14/2011    problems swallowing  . GERD (gastroesophageal reflux disease)   . Coronary artery disease   . Kidney stone   . Hypothyroidism   . Pneumonia   . Anemia    Past Surgical History:  Past Surgical History  Procedure Date  . Total hip arthroplasty august 6th 2012    right side   . Replacement total knee 2006    left knee  . Transurethral resection of prostate 1994 or 1995   Precautions/Restrictions  Precautions Precautions: Anterior Hip Restrictions Weight Bearing Restrictions: No Mobility (including Balance) Bed Mobility Bed Mobility:  (Pt was standing at sink shaving when entered room) Transfers Sit to Stand: 6: Modified independent (Device/Increase time) Sit to Stand Details (indicate cue type and reason): from recliner Stand to Sit: 6: Modified independent (Device/Increase time) Ambulation/Gait Ambulation/Gait: Yes Ambulation/Gait Assistance: 5: Supervision Ambulation/Gait Assistance Details (indicate cue type and reason): RW;vc's to keep head up and walker close;no LOB Ambulation Distance (Feet): 350 Feet Assistive device: Rolling walker Gait Pattern: Trunk flexed Gait velocity: slow Stairs: No Wheelchair Mobility Wheelchair Mobility: No    Exercise  Total Joint Exercises Long Arc Quad: Seated;Both;10 reps General Exercises - Lower Extremity Long Arc Quad: Seated;Both;10 reps Hip Flexion/Marching: Standing (1 min) Toe Raises: Standing;Both;10 reps Heel Raises: 10  reps;Both;Standing Other Exercises Other Exercises: sit<>stand x5;standing hip x10  End of Session PT - End of Session Equipment Utilized During Treatment: Gait belt (RW) Activity Tolerance: Patient tolerated treatment well Patient left: in chair;with family/visitor present General Behavior During Session: [facility] for tasks performed Cognition: [facility] for tasks performed PT Assessment/Plan  PT - Assessment/Plan Comments on Treatment Session: stair training next session;pt was able to tolerate 25 mins of standing activity with only 2 seated rest breaks due to discomfort of RLE (recent THR) PT Goals  Acute Rehab PT Goals PT Goal: Ambulate - Progress: Progressing toward goal  Chrissie Dacquisto ATKINSO 02/22/2011, 9:45 AM

## 2011-02-22 NOTE — Progress Notes (Signed)
Speech Language/Pathology Dysphagia Intervention/Diet Tolerance Patient Details  Name: Jorge Nixon MRN: [id number] Date of Birth: 10-Feb-1925  Today's Date: 02/22/2011  Past Medical History:  Past Medical History  Diagnosis Date  . Hypertension   . Diverticular disease 02/14/2011    problems swallowing  . GERD (gastroesophageal reflux disease)   . Coronary artery disease   . Kidney stone   . Hypothyroidism   . Pneumonia   . Anemia    Past Surgical History:  Past Surgical History  Procedure Date  . Total hip arthroplasty august 6th 2012    right side   . Replacement total knee 2006    left knee  . Transurethral resection of prostate 1994 or 1995     Jorge Nixon was seen for a follow-up diet tolerance today at the request of Dr. Karilyn Cota. Jorge Nixon still has a poor appetite and continues to complain of difficulty swallowing (variable). He was observed with regular textures, mechanical soft, and thin liquids by straw. Pt took all consistencies without incident- no overt signs/symptoms of aspiration.   When asked what he thought the trouble was, he reported that he just didn't have an appetite, lost the taste for foods, and that he had a difficult time initiating the swallow. He stated that sometimes he "chews and chews and chews" but doesn't swallow. This was not observed today, however he says that it occurs with meats. He was cued to take a sip of liquid after successful mastication to help trigger the swallow. He demonstrated this and found it to be effective.  Recommendation: D3/mech soft (pt reports difficulty chewing meats) and thin liquids. He was encouraged to try sweets (if not contraindicated) as this taste receptor generally remains intact. I will follow up with Jorge Nixon on Thursday if he is still admitted. His wife was not present for today's session.  Thank you, Havery Moros, CCC-SLP [phone]   Isa Kohlenberg 02/22/2011,5:18 PM

## 2011-02-22 NOTE — Progress Notes (Signed)
Subjective: Interval History: has no complaint of her nausea or vomiting. He denies any shortness of breath. Patient however still with poor appetite..  Objective: Vital signs in last 24 hours: Temp:  [98 F (36.7 C)-99 F (37.2 C)] 98.7 F (37.1 C) (09/25 0554) Pulse Rate:  [77-96] 77  (09/25 0554) Resp:  [20] 20  (09/25 0554) BP: (112-128)/(61-76) 112/61 mmHg (09/25 0554) SpO2:  [92 %-97 %] 93 % (09/25 0725) FiO2 (%):  [21 %] 21 % (09/24 1509) Weight change:   Intake/Output from previous day: 09/24 0701 - 09/25 0700 In: 6097.5 [P.O.:360; I.V.:5737.5] Out: 1750 [Urine:1750] Intake/Output this shift:    General appearance: alert Resp: clear to auscultation bilaterally Cardio: regular rate and rhythm, S1, S2 normal, no murmur, click, rub or gallop GI: soft, non-tender; bowel sounds normal; no masses,  no organomegaly Extremities: extremities normal, atraumatic, no cyanosis or edema  Lab Results:  [facility] 02/21/11 0525 02/20/11 0552  WBC 8.0 7.7  HGB 10.1* 10.1*  HCT 30.5* 31.7*  PLT 225 243   BMET:  Basename 02/22/11 0448 02/21/11 0525  NA 135 135  K 3.5 3.6  CL 103 102  CO2 22 23  GLUCOSE 105* 94  BUN 21 16  CREATININE 2.71* 2.90*  CALCIUM 8.2* 8.1*   No results found for this basename: PTH:2 in the last 72 hours Iron Studies: No results found for this basename: IRON,TIBC,TRANSFERRIN,FERRITIN in the last 72 hours  Studies/Results: US Abdomen Complete  02/20/2011  *RADIOLOGY REPORT*  Clinical Data:  Right upper abdominal pain  COMPLETE ABDOMINAL ULTRASOUND  Comparison:  CT 02/17/2011 and earlier studies  Findings:  Gallbladder:  No gallstones, gallbladder wall thickening, or pericholecystic fluid. Sonographer reports no sonographic Murphy'Nixon sign.  Common bile duct:  9.3 mm in diameter, without evidence of choledocholithiasis.  Liver:  Focal calcification in the posterior right hepatic segment corresponding granuloma on prior CT.  No new lesion is evident.  No  intrahepatic biliary ductal dilatation.  Background parenchyma unremarkable.  IVC:  Appears normal.  Pancreas:  No focal abnormality seen.  Spleen:  6.9 cm craniocaudal length., unremarkable  Right Kidney:  10.3 cm.  No hydronephrosis.  15 x 17 x 20 mm cyst in the     interpolar region, relatively stable since scans dating back to 07/02/2005.  Left Kidney:  12.3 cm.  No hydronephrosis.  3.7 cm parapelvic cyst stable.  Abdominal aorta:  Atheromatous, dilated up to 3.1 cm.  Right common iliac artery measures up to 14 mm diameter.  IMPRESSION:  1.  Normal gallbladder. 2.  Stable small infrarenal aortic aneurysm. 3.  Stable bilateral renal cysts.  Original Report Authenticated By: Osa Craver, M.D.   Dg Esophagus  02/21/2011  *RADIOLOGY REPORT*  Clinical Data: Dysphagia.  Sore throat.  ESOPHOGRAM / BARIUM SWALLOW  Technique:  Combined double contrast and single contrast examination performed using thin barium liquid.  Fluoroscopy time:  3.4 minutes.  Comparison: 02/15/2011 chest CT  Findings:  Primary peristaltic waves in the esophagus were disrupted on 2/4 swallows, with occasional secondary and tertiary contractions noted.  The patient was unable to stand upright or assume standard RAO positioning.  We performed the exam in the LPO position using thin barium only.  Effervescent crystals were not utilized.  Because the patient was unable to drink quickly, we were unable to fully distend the distal most esophagus.  Series 23 best shows the maximum distended appearance of the esophagus.  A 13 mm barium tablet transiently impacted for  about 2 minutes at this level.  We administered water to help clear the tablet, and the patient experience coughing immediately after drinking water, suggesting that he may not be completely protecting his airway.  IMPRESSION:  1.  Primary peristaltic waves were disrupted on 2/4 swallows, borderline for nonspecific esophageal dysmotility.  He did have several episodes of  secondary and tertiary contractions of the esophagus. 2.  We were only able to distend the distal esophagus to about 13 mm, and a 13 mm barium tablet transiently impacted in the distal esophagus. However, the patient was only able to piecemeal swallow, and thus the reduced esophageal diameter distally may simply be from an adequate distention rather than true narrowing/stricturing. 3.  The patient did experience coughing immediately after drinking water, raising the possibility that he is not completely protecting his airway.  Original Report Authenticated By: Dellia Cloud, M.D.    I have reviewed the patient'Nixon current medications.  Assessment/Plan: Problem #1 acute kidney injury superimposed on her chronic presently is pending creatinine seems  improving progressively her patient has this moment doesn't have any uremic sinus symptoms have. Problem #2 history of hypothyroidism is on Synthroid Problem #3 history of her comment acquired pneumonia patient a febrile with normal white cell count. Problem #4 history of  hypokalemia her potassium has normalized.. Her patient is not on any potassium supplement. Problem #5 history of her hip surgery Problem #6 poor by mouth intake with low albumin her presently  started on Nepro.  problem #7 history of anemia he is  hemoglobin and hematocrit seems to be stable Recommendation I will continue his present management. I will continue also was IV fluid at present rate. I will check her history is metabolic panel and CBC in the morning.    LOS: 12 days   Jorge Nixon 02/22/2011,8:59 AM

## 2011-02-23 LAB — BASIC METABOLIC PANEL
CO2: 21 mEq/L (ref 19–32)
Calcium: 8.1 mg/dL — ABNORMAL LOW (ref 8.4–10.5)
Glucose, Bld: 105 mg/dL — ABNORMAL HIGH (ref 70–99)
Potassium: 3.4 mEq/L — ABNORMAL LOW (ref 3.5–5.1)
Sodium: 134 mEq/L — ABNORMAL LOW (ref 135–145)

## 2011-02-23 LAB — CBC
Hemoglobin: 9.3 g/dL — ABNORMAL LOW (ref 13.0–17.0)
MCH: 27.5 pg (ref 26.0–34.0)
MCV: 82.2 fL (ref 78.0–100.0)
Platelets: 210 10*3/uL (ref 150–400)
RBC: 3.38 MIL/uL — ABNORMAL LOW (ref 4.22–5.81)
WBC: 7.4 10*3/uL (ref 4.0–10.5)

## 2011-02-23 MED ORDER — IPRATROPIUM BROMIDE 0.02 % IN SOLN: 0.5000 mg | Freq: Four times a day (QID) | RESPIRATORY_TRACT | Status: DC

## 2011-02-23 MED ORDER — ALBUTEROL SULFATE (5 MG/ML) 0.5% IN NEBU: 2.5000 mg | INHALATION_SOLUTION | Freq: Three times a day (TID) | RESPIRATORY_TRACT | Status: DC

## 2011-02-23 MED ORDER — IPRATROPIUM BROMIDE 0.02 % IN SOLN
0.5000 mg | Freq: Three times a day (TID) | RESPIRATORY_TRACT | Status: DC
  Administered 2011-02-24 – 2011-02-25 (×3): 0.5 mg via RESPIRATORY_TRACT
  Filled 2011-02-23 (×3): qty 2.5

## 2011-02-23 MED ORDER — ALBUTEROL SULFATE (5 MG/ML) 0.5% IN NEBU
2.5000 mg | INHALATION_SOLUTION | Freq: Three times a day (TID) | RESPIRATORY_TRACT | Status: DC
  Administered 2011-02-24 – 2011-02-25 (×3): 2.5 mg via RESPIRATORY_TRACT
  Filled 2011-02-23 (×3): qty 0.5

## 2011-02-23 NOTE — Progress Notes (Addendum)
  Alert. He tells me he has no appetite but he is trying to eat.Megace to increase his appetite.   He ate a container of Cream of Wheat this am that is wife brought in.  He tells me his stomach is hurting ?  from the po Avelox .  He says his throat is sore.   He says his dysphagia is much better. He is coughing up white mucous at night.   He was encouraged to eat and drink fluids. His BUN has increased slightly but  Creatinine is coming down.  Wife is in room with patient.  As above agents that she can swallow better; he has no difficulty with liquids. He is been drinking supplements in the form of Nepro and ensure. He had normal bowel movement today. His hemoglobin has dropped no evidence of active bleeding. No further GI workup planned at this time.

## 2011-02-23 NOTE — Progress Notes (Signed)
Physical Therapy Treatment Patient Name: Jorge Nixon Date: 02/23/2011 Time: 13:47-14:04 Charges: 1 Gait Problem List:  Patient Active Problem List  Diagnoses  . PNA (pneumonia)  . Dysphagia  . Anemia  . Weakness generalized  . S/P hip replacement right  . Pain, joint, hip, right  . Hypokalemia  . Protein-calorie malnutrition, severe  . Acute renal failure   Past Medical History:  Past Medical History  Diagnosis Date  . Hypertension   . Diverticular disease 02/14/2011    problems swallowing  . GERD (gastroesophageal reflux disease)   . Coronary artery disease   . Kidney stone   . Hypothyroidism   . Pneumonia   . Anemia    Past Surgical History:  Past Surgical History  Procedure Date  . Total hip arthroplasty august 6th 2012    right side   . Replacement total knee 2006    left knee  . Transurethral resection of prostate 1994 or 1995   Precautions/Restrictions  Precautions Precautions: Anterior Hip Restrictions Weight Bearing Restrictions: No Mobility (including Balance) Bed Mobility Bed Mobility: Yes Sit to Supine - Left: 6: Modified independent (Device/Increase time) (with bedrail.) Transfers Transfers: Yes Sit to Stand: 4: Min assist;From chair/3-in-1 Sit to Stand Details (indicate cue type and reason): to stabilize RW to maintain balance once standing Stand to Sit: 5: Supervision;To bed Stand to Sit Details: for safety to low surface  Ambulation/Gait Ambulation/Gait: Yes Ambulation/Gait Assistance: 4: Min assist Ambulation/Gait Assistance Details (indicate cue type and reason): verbal cues for upright posture and to stay closer to RW Ambulation Distance (Feet): 150 Feet (patient exhausted today.  He did not feel up for longer walk) Assistive device: Rolling walker Gait Pattern: Shuffle;Trunk flexed    Exercise  Total Joint Exercises Long Arc Quad: AROM;Both;10 reps;Seated General Exercises - Upper Extremity Shoulder Flexion: AROM;Both;10  reps;Other reps (comment);Seated;Other (comment) (within available ROM) General Exercises - Lower Extremity Long Arc Quad: AROM;Both;10 reps;Seated Hip Flexion/Marching: AROM;Both;10 reps;Seated Toe Raises: AROM;Both;20 reps;Seated Heel Raises: AROM;Both;15 reps;Seated Low Level/ICU Exercises Shoulder Flexion: AROM;Both;10 reps;Other reps (comment);Seated;Other (comment) (within available ROM)  End of Session PT - End of Session Equipment Utilized During Treatment: Gait belt;Other (comment) (RW) Activity Tolerance: Patient limited by fatigue Patient left: in bed;with call bell in reach General Behavior During Session: [facility] for tasks performed PT Assessment/Plan  PT - Assessment/Plan Comments on Treatment Session: The patient did not sleep well last night and it is affecting his ability to perform as well as last session.   PT Plan: Discharge plan remains appropriate PT Frequency: Min 3X/week Follow Up Recommendations: Home health PT PT Goals  Acute Rehab PT Goals PT Goal Formulation: With patient Time For Goal Achievement: 7 days Pt will go Supine/Side to Sit: Independently PT Goal: Supine/Side to Sit - Progress: Progressing toward goal Pt will go Sit to Supine/Side: Independently PT Goal: Sit to Supine/Side - Progress: Progressing toward goal Pt will Ambulate: >150 feet;with modified independence;with rolling walker PT Goal: Ambulate - Progress: Progressing toward goal Pt will Go Up / Down Stairs: 3-5 stairs;with supervision PT Goal: Up/Down Stairs - Progress: Other (comment) (not tested)  Damisha Wolff, Claretta Fraise 02/23/2011, 2:31 PM

## 2011-02-23 NOTE — Plan of Care (Signed)
Problem: Discharge Progression Outcomes Goal: Activity appropriate for discharge plan Outcome: Completed/Met Date Met:  02/23/11 Yes, see PT notes for details.

## 2011-02-23 NOTE — Progress Notes (Signed)
Jorge Nixon  MRN: [id number]  DOB/AGE: 75-Jun-1926 75 y.o.  Primary Care Physician:HALL,ZACK, MD  Admit date: 02/10/2011  Chief Complaint: Pt presented with Weakness, cough, low-grade fever.    Nixon-Pt presented on  02/10/2011 with  Weakness, cough, low-grade fever.    Pt today feels better      . ipratropium  0.5 mg Nebulization QID   And  . albuterol  2.5 mg Nebulization QID  . feeding supplement  237 mL Oral TID WC  . feeding supplement  30 mL Oral TID WC  . levothyroxine  62.5 mcg Oral QAC breakfast  . megestrol  200 mg Oral Daily  . moxifloxacin  400 mg Oral q1800  . nystatin   Topical QID  . pantoprazole  40 mg Oral Q1200  . potassium chloride  40 mEq Oral Once         WUJ:WJXBJ from the symptoms mentioned above,there are no other symptoms referable to all systems reviewed.  Physical Exam: Vital signs in last 24 hours: Temp:  [98.2 F (36.8 C)-98.9 F (37.2 C)] 98.2 F (36.8 C) (09/26 0538) Pulse Rate:  [71-99] 91  (09/26 0538) Resp:  [18] 18  (09/26 0538) BP: (121-132)/(68-75) 121/68 mmHg (09/26 0538) SpO2:  [93 %-97 %] 94 % (09/26 0712) FiO2 (%):  [21 %] 21 % (09/25 1535) Weight change:  Last BM Date: 02/22/11  Intake/Output from previous day: 09/25 0701 - 09/26 0700 In: 3771.8 [P.O.:480; I.V.:3291.8] Out: 1375 [Urine:1375]     Physical Exam: General- pt is awake,alert, oriented to time place and person Resp- No acute REsp distress, CTA B/L NO Rhonchi CVS- S1S2 regular ij rate and rhythm GIT- BS+, soft, NT, ND EXT- NO LE Edema, Cyanosis   Lab Results: CBC  Basename 02/23/11 0523 02/21/11 0525  WBC 7.4 8.0  HGB 9.3* 10.1*  HCT 27.8* 30.5*  PLT 210 225    BMET  Basename 02/23/11 0523 02/22/11 0448  NA 134* 135  K 3.4* 3.5  CL 103 103  CO2 21 22  GLUCOSE 105* 105*  BUN 22 21  CREATININE 2.52* 2.71*  CALCIUM 8.1* 8.2*    Results for Jorge Nixon, Jorge Nixon (MRN [id number]) as of 02/23/2011 09:42  Ref. Range 02/19/2011 05:48 02/20/2011 05:52  02/21/2011 05:25 02/22/2011 04:48 02/23/2011 05:23  Creat Latest Range: 0.50-1.35 mg/dL 3.08 (H) 6.57 (H) 8.46 (H) 2.71 (H) 2.52 (H)     MICRO Recent Results (from the past 240 hour(Nixon))  CULTURE, SPUTUM-ASSESSMENT     Status: Normal   Collection Time   02/16/11  5:03 AM      Component Value Range Status Comment   Specimen Description SPUTUM EXPECTORATED   Final    Special Requests NONE   Final    Sputum evaluation     Final    Value: THIS SPECIMEN IS ACCEPTABLE. RESPIRATORY CULTURE REPORT TO FOLLOW.     Performed at [facility]   Report Status 02/16/2011 FINAL   Final   CULTURE, RESPIRATORY     Status: Normal   Collection Time   02/16/11  5:03 AM      Component Value Range Status Comment   Specimen Description SPUTUM EXPECTORATED   Final    Special Requests NONE   Final    Gram Stain     Final    Value: ABUNDANT WBC PRESENT, PREDOMINANTLY PMN     ABUNDANT SQUAMOUS EPITHELIAL CELLS PRESENT     RARE YEAST WITH PSEUDOHYPHAE   Culture MODERATE CANDIDA ALBICANS  Final    Report Status 02/18/2011 FINAL   Final   CLOSTRIDIUM DIFFICILE BY PCR     Status: Normal   Collection Time   02/17/11  2:39 PM      Component Value Range Status Comment   C difficile by pcr NEGATIVE  NEGATIVE  Final   CLOSTRIDIUM DIFFICILE BY PCR     Status: Normal   Collection Time   02/22/11 10:02 AM      Component Value Range Status Comment   C difficile by pcr NEGATIVE  NEGATIVE  Final       Lab Results  Component Value Date   CALCIUM 8.1* 02/23/2011   CAION 1.12 10/20/2008   PHOS 5.0* 02/20/2011               Impression: 1)Renal  AKI                AKI secondary to SIRS ( Infection)+ HYpotension + Meds( Hyzaar-was held )               AKI now better               Creat trending downwards.  2)CVS   Hemodynamically stable                  BP at goal  3)Anemia HGb at goal (9--11)   4)CKD Mineral-Bone Disorder-       Calcium at goal       PO4 at goal  5)FEN   Hypokalemic-replaced Hyponatremic-Minimal   6)Acid base Co2 at goal     Plan:   Agree with current treatment and plan Will continue with current regimen. Will follow BMet to see Creat trend     Jorge Nixon 02/23/2011, 9:42 AM

## 2011-02-23 NOTE — Progress Notes (Signed)
Subjective: Getting a little better every day.  Walked to nurses station with walker yesterday.  Po intake is improving.   Physical Exam: Blood pressure 121/68, pulse 91, temperature 98.2 F (36.8 C), temperature source Oral, resp. rate 18, height 5\' 10"  (1.778 m), weight 64.8 kg (142 lb 13.7 oz), SpO2 94.00%. An alert and oriented up in chair eating breakfast in no apparent distress regular rate and rhythm without murmurs rubs or gallops chest clear to agitation bilaterally and no wheezes rhonchi or rales abdomen is soft nontender nondistended positive bowel sounds no clubbing cyanosis or edema in extremities skin no rashes psych normal mood and affect neuro no focal neurologic deficits   Investigations: Results for orders placed during the hospital encounter of 02/10/11 (from the past 48 hour(s))  BASIC METABOLIC PANEL     Status: Abnormal   Collection Time   02/22/11  4:48 AM      Component Value Range Comment   Sodium 135  135 - 145 (mEq/L)    Potassium 3.5  3.5 - 5.1 (mEq/L)    Chloride 103  96 - 112 (mEq/L)    CO2 22  19 - 32 (mEq/L)    Glucose, Bld 105 (*) 70 - 99 (mg/dL)    BUN 21  6 - 23 (mg/dL)    Creatinine, Ser 7.82 (*) 0.50 - 1.35 (mg/dL)    Calcium 8.2 (*) 8.4 - 10.5 (mg/dL)    GFR calc non Af Amer 23 (*) >60 (mL/min)    GFR calc Af Amer 27 (*) >60 (mL/min)   CLOSTRIDIUM DIFFICILE BY PCR     Status: Normal   Collection Time   02/22/11 10:02 AM      Component Value Range Comment   C difficile by pcr NEGATIVE  NEGATIVE    BASIC METABOLIC PANEL     Status: Abnormal   Collection Time   02/23/11  5:23 AM      Component Value Range Comment   Sodium 134 (*) 135 - 145 (mEq/L)    Potassium 3.4 (*) 3.5 - 5.1 (mEq/L)    Chloride 103  96 - 112 (mEq/L)    CO2 21  19 - 32 (mEq/L)    Glucose, Bld 105 (*) 70 - 99 (mg/dL)    BUN 22  6 - 23 (mg/dL)    Creatinine, Ser 9.56 (*) 0.50 - 1.35 (mg/dL)    Calcium 8.1 (*) 8.4 - 10.5 (mg/dL)    GFR calc non Af Amer 25 (*) >60 (mL/min)    GFR calc Af Amer 30 (*) >60 (mL/min)   CBC     Status: Abnormal   Collection Time   02/23/11  5:23 AM      Component Value Range Comment   WBC 7.4  4.0 - 10.5 (K/uL)    RBC 3.38 (*) 4.22 - 5.81 (MIL/uL)    Hemoglobin 9.3 (*) 13.0 - 17.0 (g/dL)    HCT 21.3 (*) 08.6 - 52.0 (%)    MCV 82.2  78.0 - 100.0 (fL)    MCH 27.5  26.0 - 34.0 (pg)    MCHC 33.5  30.0 - 36.0 (g/dL)    RDW 57.8 (*) 46.9 - 15.5 (%)    Platelets 210  150 - 400 (K/uL)    Recent Results (from the past 240 hour(s))  CULTURE, SPUTUM-ASSESSMENT     Status: Normal   Collection Time   02/16/11  5:03 AM      Component Value Range Status Comment   Specimen Description  SPUTUM EXPECTORATED   Final    Special Requests NONE   Final    Sputum evaluation     Final    Value: THIS SPECIMEN IS ACCEPTABLE. RESPIRATORY CULTURE REPORT TO FOLLOW.     Performed at [facility]   Report Status 02/16/2011 FINAL   Final   CULTURE, RESPIRATORY     Status: Normal   Collection Time   02/16/11  5:03 AM      Component Value Range Status Comment   Specimen Description SPUTUM EXPECTORATED   Final    Special Requests NONE   Final    Gram Stain     Final    Value: ABUNDANT WBC PRESENT, PREDOMINANTLY PMN     ABUNDANT SQUAMOUS EPITHELIAL CELLS PRESENT     RARE YEAST WITH PSEUDOHYPHAE   Culture MODERATE CANDIDA ALBICANS   Final    Report Status 02/18/2011 FINAL   Final   CLOSTRIDIUM DIFFICILE BY PCR     Status: Normal   Collection Time   02/17/11  2:39 PM      Component Value Range Status Comment   C difficile by pcr NEGATIVE  NEGATIVE  Final   CLOSTRIDIUM DIFFICILE BY PCR     Status: Normal   Collection Time   02/22/11 10:02 AM      Component Value Range Status Comment   C difficile by pcr NEGATIVE  NEGATIVE  Final     Dg Esophagus  02/21/2011  *RADIOLOGY REPORT*  Clinical Data: Dysphagia.  Sore throat.  ESOPHOGRAM / BARIUM SWALLOW  Technique:  Combined double contrast and single contrast examination performed using thin barium liquid.   Fluoroscopy time:  3.4 minutes.  Comparison: 02/15/2011 chest CT  Findings:  Primary peristaltic waves in the esophagus were disrupted on 2/4 swallows, with occasional secondary and tertiary contractions noted.  The patient was unable to stand upright or assume standard RAO positioning.  We performed the exam in the LPO position using thin barium only.  Effervescent crystals were not utilized.  Because the patient was unable to drink quickly, we were unable to fully distend the distal most esophagus.  Series 23 best shows the maximum distended appearance of the esophagus.  A 13 mm barium tablet transiently impacted for about 2 minutes at this level.  We administered water to help clear the tablet, and the patient experience coughing immediately after drinking water, suggesting that he may not be completely protecting his airway.  IMPRESSION:  1.  Primary peristaltic waves were disrupted on 2/4 swallows, borderline for nonspecific esophageal dysmotility.  He did have several episodes of secondary and tertiary contractions of the esophagus. 2.  We were only able to distend the distal esophagus to about 13 mm, and a 13 mm barium tablet transiently impacted in the distal esophagus. However, the patient was only able to piecemeal swallow, and thus the reduced esophageal diameter distally may simply be from an adequate distention rather than true narrowing/stricturing. 3.  The patient did experience coughing immediately after drinking water, raising the possibility that he is not completely protecting his airway.  Original Report Authenticated By: Dellia Cloud, M.D.     Medications: I have reviewed the patient's current medications.  Impression: Principal Problem:  *PNA (pneumonia)  pneumonia is improving with antibiotics. He was on vancomycin and Zosyn IV from admission September 13 to September 18 at which point Avelox was started. He has been on Avelox since September 18. He did receive a dose of  clindamycin on September  18. So this would be day 12 of antibiotic treatment to complete a 14 day course. Active Problems:  Dysphagia  improving and adjustment of his diet. GI is following patient.  Anemia  Weakness generalized  S/P hip replacement right  Pain, joint, hip, right  Hypokalemia  Protein-calorie malnutrition, severe  Acute renal failure  also improving with IV fluids per nephrology.   Plan: The main issue for continued hospitalization for Jorge Nixon is his renal failure which is improving daily. Disposition will be to home with home health and physical therapy with his Nixon after his renal function improves to a point where it is okay to discharge with nephrology which will likely be in the next several days. We did try to qualify him for continued inpatient rehabilitation which he did not qualify for. His Nixon is with him here daily and is anxious about him going home reassurance has been provided every day however I wonder also if the Nixon has some underlying dementia. Again he will definitely need as much home health and help at home that we can get for him at discharge.     LOS: 13 days   DAVID,RACHAL A 02/23/2011, 9:58 AM

## 2011-02-24 DIAGNOSIS — K1379 Other lesions of oral mucosa: Secondary | ICD-10-CM | POA: Diagnosis present

## 2011-02-24 DIAGNOSIS — Q394 Esophageal web: Secondary | ICD-10-CM

## 2011-02-24 DIAGNOSIS — K224 Dyskinesia of esophagus: Secondary | ICD-10-CM | POA: Diagnosis present

## 2011-02-24 LAB — CBC
HCT: 27.2 % — ABNORMAL LOW (ref 39.0–52.0)
Hemoglobin: 9.1 g/dL — ABNORMAL LOW (ref 13.0–17.0)
MCH: 27.2 pg (ref 26.0–34.0)
MCHC: 33.5 g/dL (ref 30.0–36.0)
MCV: 81.4 fL (ref 78.0–100.0)
Platelets: 201 10*3/uL (ref 150–400)
RBC: 3.34 MIL/uL — ABNORMAL LOW (ref 4.22–5.81)
RDW: 16.6 % — ABNORMAL HIGH (ref 11.5–15.5)
WBC: 8.2 10*3/uL (ref 4.0–10.5)

## 2011-02-24 LAB — BASIC METABOLIC PANEL
BUN: 22 mg/dL (ref 6–23)
CO2: 21 mEq/L (ref 19–32)
Calcium: 8.1 mg/dL — ABNORMAL LOW (ref 8.4–10.5)
Chloride: 103 mEq/L (ref 96–112)
Creatinine, Ser: 2.36 mg/dL — ABNORMAL HIGH (ref 0.50–1.35)
GFR calc Af Amer: 32 mL/min — ABNORMAL LOW (ref >60)
GFR calc non Af Amer: 26 mL/min — ABNORMAL LOW (ref >60)
Glucose, Bld: 114 mg/dL — ABNORMAL HIGH (ref 70–99)
Potassium: 3.2 mEq/L — ABNORMAL LOW (ref 3.5–5.1)
Sodium: 134 mEq/L — ABNORMAL LOW (ref 135–145)

## 2011-02-24 LAB — PHOSPHORUS: Phosphorus: 2.7 mg/dL (ref 2.3–4.6)

## 2011-02-24 MED ORDER — NEPRO/CARBSTEADY PO LIQD
237.0000 mL | Freq: Three times a day (TID) | ORAL | Status: DC
  Administered 2011-02-25: 237 mL via ORAL

## 2011-02-24 MED ORDER — POTASSIUM CHLORIDE 10 MEQ/100ML IV SOLN
10.0000 meq | INTRAVENOUS | Status: AC
  Administered 2011-02-24 (×4): 10 meq via INTRAVENOUS
  Filled 2011-02-24 (×2): qty 100

## 2011-02-24 MED ORDER — POTASSIUM CHLORIDE CRYS ER 20 MEQ PO TBCR
40.0000 meq | EXTENDED_RELEASE_TABLET | Freq: Two times a day (BID) | ORAL | Status: DC
  Administered 2011-02-24: 40 meq via ORAL
  Filled 2011-02-24: qty 2

## 2011-02-24 MED ORDER — POTASSIUM CHLORIDE 10 MEQ/100ML IV SOLN
INTRAVENOUS | Status: AC
  Administered 2011-02-24: 10 meq via INTRAVENOUS
  Filled 2011-02-24: qty 100

## 2011-02-24 MED ORDER — MEGESTROL ACETATE 400 MG/10ML PO SUSP
400.0000 mg | Freq: Every day | ORAL | Status: DC
  Administered 2011-02-25: 400 mg via ORAL
  Filled 2011-02-24: qty 10

## 2011-02-24 MED ORDER — VITAMIN C 500 MG PO TABS
500.0000 mg | ORAL_TABLET | Freq: Two times a day (BID) | ORAL | Status: DC
  Administered 2011-02-24 – 2011-02-25 (×3): 500 mg via ORAL
  Filled 2011-02-24 (×3): qty 1

## 2011-02-24 MED ORDER — MAGIC MOUTHWASH
10.0000 mL | Freq: Three times a day (TID) | ORAL | Status: DC
  Administered 2011-02-24 – 2011-02-25 (×3): 10 mL via ORAL
  Filled 2011-02-24: qty 10
  Filled 2011-02-24 (×2): qty 5

## 2011-02-24 NOTE — Progress Notes (Signed)
Chart reviewed  Subjective: Complains of poor appetite, bloating, weakness, mouth pain. Feels slightly nauseated. Feels worse after she got the oral potassium tablet. Still with cough at night. He feels depressed.  Objective: Vital signs in last 24 hours: Filed Vitals:   02/23/11 1507 02/23/11 2135 02/24/11 0625 02/24/11 0842  BP:  127/73 149/79   Pulse:  98 93   Temp:  99.6 F (37.6 C) 99.5 F (37.5 C)   TempSrc:  Oral    Resp:  16 20   Height:      Weight:      SpO2: 95% 94% 94% 95%   Weight change:   Intake/Output Summary (Last 24 hours) at 02/24/11 1510 Last data filed at 02/24/11 0900  Gross per 24 hour  Intake 3018.75 ml  Output   1000 ml  Net 2018.75 ml   General: Chronically ill-appearing.  Lungs: Clear to auscultation bilaterally without wheeze rhonchi or rales Cardiovascular: Regular rate rhythm without murmurs gallops rubs Abdomen soft normal bowel sounds nontender nondistended Extremities no clubbing cyanosis or edema Psychiatric: Flat affect. Occasionally tearful. Lab Results: Basic Metabolic Panel:  Lab 02/24/11 9562 02/23/11 0523 02/20/11 0552  NA 134* 134* --  K 3.2* 3.4* --  CL 103 103 --  CO2 21 21 --  GLUCOSE 114* 105* --  BUN 22 22 --  CREATININE 2.36* 2.52* --  CALCIUM 8.1* 8.1* --  MG -- -- --  PHOS 2.7 -- 5.0*   Liver Function Tests: No results found for this basename: AST:2,ALT:2,ALKPHOS:2,BILITOT:2,PROT:2,ALBUMIN:2 in the last 168 hours No results found for this basename: LIPASE:2,AMYLASE:2 in the last 168 hours No results found for this basename: AMMONIA:2 in the last 168 hours CBC:  Lab 02/24/11 0537 02/23/11 0523  WBC 8.2 7.4  NEUTROABS -- --  HGB 9.1* 9.3*  HCT 27.2* 27.8*  MCV 81.4 82.2  PLT 201 210   Cardiac Enzymes: No results found for this basename: CKTOTAL:3,CKMB:3,CKMBINDEX:3,TROPONINI:3 in the last 168 hours BNP: No results found for this basename: POCBNP:3 in the last 168 hours D-Dimer: No results found for  this basename: DDIMER:2 in the last 168 hours CBG: No results found for this basename: GLUCAP:6 in the last 168 hours Hemoglobin A1C: No results found for this basename: HGBA1C in the last 168 hours Fasting Lipid Panel: No results found for this basename: CHOL,HDL,LDLCALC,TRIG,CHOLHDL,LDLDIRECT in the last 130 hours Thyroid Function Tests: No results found for this basename: TSH,T4TOTAL,FREET4,T3FREE,THYROIDAB in the last 168 hours Anemia Panel: No results found for this basename: VITAMINB12,FOLATE,FERRITIN,TIBC,IRON,RETICCTPCT in the last 168 hours  Micro Results: Recent Results (from the past 240 hour(s))  CULTURE, SPUTUM-ASSESSMENT     Status: Normal   Collection Time   02/16/11  5:03 AM      Component Value Range Status Comment   Specimen Description SPUTUM EXPECTORATED   Final    Special Requests NONE   Final    Sputum evaluation     Final    Value: THIS SPECIMEN IS ACCEPTABLE. RESPIRATORY CULTURE REPORT TO FOLLOW.     Performed at [facility]   Report Status 02/16/2011 FINAL   Final   CULTURE, RESPIRATORY     Status: Normal   Collection Time   02/16/11  5:03 AM      Component Value Range Status Comment   Specimen Description SPUTUM EXPECTORATED   Final    Special Requests NONE   Final    Gram Stain     Final    Value: ABUNDANT WBC PRESENT, PREDOMINANTLY  PMN     ABUNDANT SQUAMOUS EPITHELIAL CELLS PRESENT     RARE YEAST WITH PSEUDOHYPHAE   Culture MODERATE CANDIDA ALBICANS   Final    Report Status 02/18/2011 FINAL   Final   CLOSTRIDIUM DIFFICILE BY PCR     Status: Normal   Collection Time   02/17/11  2:39 PM      Component Value Range Status Comment   C difficile by pcr NEGATIVE  NEGATIVE  Final   CLOSTRIDIUM DIFFICILE BY PCR     Status: Normal   Collection Time   02/22/11 10:02 AM      Component Value Range Status Comment   C difficile by pcr NEGATIVE  NEGATIVE  Final    Studies/Results: No results found. Medications: I have reviewed the patient's current  medications. Scheduled Meds:   . ipratropium  0.5 mg Nebulization TID   And  . albuterol  2.5 mg Nebulization TID  . feeding supplement  237 mL Oral TID WC  . feeding supplement  30 mL Oral TID WC  . levothyroxine  62.5 mcg Oral QAC breakfast  . magic mouthwash  10 mL Oral TID  . megestrol  400 mg Oral Daily  . nystatin   Topical QID  . pantoprazole  40 mg Oral Q1200  . potassium chloride  10 mEq Intravenous Q1 Hr x 4  . vitamin C  500 mg Oral BID  . DISCONTD: albuterol  2.5 mg Nebulization QID  . DISCONTD: albuterol  2.5 mg Nebulization TID  . DISCONTD: ipratropium  0.5 mg Nebulization QID  . DISCONTD: ipratropium  0.5 mg Nebulization QID  . DISCONTD: megestrol  200 mg Oral Daily  . DISCONTD: moxifloxacin  400 mg Oral q1800  . DISCONTD: potassium chloride  40 mEq Oral BID   Continuous Infusions:   . dextrose 5 % and 0.45% NaCl 75 mL/hr at 02/24/11 0923   PRN Meds:.acetaminophen, alum & mag hydroxide-simeth, bisacodyl, chlorpheniramine-HYDROcodone, ondansetron (ZOFRAN) IV, ondansetron, phenol, senna-docusate, traMADol, DISCONTD: magic mouthwash Assessment/Plan: Principal Problem:  *PNA (pneumonia) Active Problems:  Acute renal failure  Dysphagia  Anemia  Hypokalemia  Protein-calorie malnutrition, severe  Esophageal dysmotility  Mouth pain  Weakness generalized  S/P hip replacement right  Esophageal web  Creatinine is slowly improving. He is still on IV fluids. He may be stable to go within a day or 2, but he is really not eating much today. He is at high risk for readmission.  His appetite remains poor which is most likely multifactorial. He still on Avelox and has received 14 days of antibiotics. This could be contributing to his poor appetite and vague abdominal symptoms. He can stop antibiotics. He appears depressed and this could be affecting his appetite. He's only on 20 mg of Megace and I will increase this to 400 mg. I've encouraged him to continue to be small  amounts multiple times a day. He reports that some of his dyspepsia worsened after his potassium tablet. I will change potassium repletion to IV. Once he is medically stabilized, I suspect his spirits will improve at home and hopefully his appetite. Hopefully he can be discharged soon. I've asked the chaplain to make a visit.  Mouth pain: He has no evidence of thrush. Continue Magic mouthwash. Change to 3 times a day scheduled. He is quite malnourished and may in fact be vitamin C deficient. He may have a mild mucositis from this. I will check a vitamin C level and add vitamin C to his regimen.  Hypokalemia, see above  45 minutes was spent with the patient and his wife.  LOS: 14 days   Jarissa Sheriff L 02/24/2011, 3:10 PM

## 2011-02-24 NOTE — Progress Notes (Signed)
Subjective: Interval History: has no complaint of nausea or vomiting. He has is still  pain when his swallowing. He denies any shortness of breath orthopnea or paroxysmal nocturnal dyspnea..  Objective: Vital signs in last 24 hours: Temp:  [98.3 F (36.8 C)-99.6 F (37.6 C)] 99.5 F (37.5 C) (09/27 0625) Pulse Rate:  [66-98] 93  (09/27 0625) Resp:  [16-20] 20  (09/27 0625) BP: (127-149)/(73-79) 149/79 mmHg (09/27 0625) SpO2:  [90 %-97 %] 95 % (09/27 0842) Weight change:   Intake/Output from previous day: 09/26 0701 - 09/27 0700 In: 3623.3 [P.O.:660; I.V.:2963.3] Out: 1500 [Urine:1500] Intake/Output this shift:    General appearance: alert Resp: clear to auscultation bilaterally Cardio: regular rate and rhythm, S1, S2 normal, no murmur, click, rub or gallop GI: soft, non-tender; bowel sounds normal; no masses,  no organomegaly Extremities: extremities normal, atraumatic, no cyanosis or edema  Lab Results:  [facility] 02/24/11 0537 02/23/11 0523  WBC 8.2 7.4  HGB 9.1* 9.3*  HCT 27.2* 27.8*  PLT 201 210   BMET:  Basename 02/24/11 0537 02/23/11 0523  NA 134* 134*  K 3.2* 3.4*  CL 103 103  CO2 21 21  GLUCOSE 114* 105*  BUN 22 22  CREATININE 2.36* 2.52*  CALCIUM 8.1* 8.1*   No results found for this basename: PTH:2 in the last 72 hours Iron Studies: No results found for this basename: IRON,TIBC,TRANSFERRIN,FERRITIN in the last 72 hours  Studies/Results: No results found.  I have reviewed the patient's current medications.  Assessment/Plan: Problem #1 acute renal failure pending creatinine is slowly improving. He doesn't have any uremic sinus symptoms. His urine output is good. Problem #2 hypokalemia this is probably secondary to potassium through the urine potassium is low. Problem #3 history of her dysphasia still patient seems to have a problem visit. Problem #4 history of her comment acquired pneumonia patient is a febrile his white cell count is normal. Problem  #5 history of anemia his H&H is stable Problem #6 history of poor nutrition he is on Nepro her. Problem #7 history of generalized weakness. Problem #8 history of hypothyroidism is on Synthroid. Recommendation we'll give him KCl 40 mEq elixir by mouth twice a day We'll follow his basic metabolic panel in the morning. We'll continue his other medication as before. And the we'll decrease his IV fluid to 75 cc per hour.    LOS: 14 days   Alayzia Pavlock S 02/24/2011,9:13 AM

## 2011-02-24 NOTE — Progress Notes (Signed)
Follow-up- ADULT NUTRITION ASSESSMENT Date: 02/24/2011   Time: 2:26 PM Reason for Assessment: Poor appetite, FTT  ASSESSMENT: Male 77 y.o.  Dx: PNA (pneumonia)  Past Medical History  Diagnosis Date  . Hypertension   . Diverticular disease 02/14/2011    problems swallowing  . GERD (gastroesophageal reflux disease)   . Coronary artery disease   . Kidney stone   . Hypothyroidism   . Pneumonia   . Anemia     Scheduled Meds:    . ipratropium  0.5 mg Nebulization TID   And  . albuterol  2.5 mg Nebulization TID  . feeding supplement  237 mL Oral TID WC  . feeding supplement  30 mL Oral TID WC  . levothyroxine  62.5 mcg Oral QAC breakfast  . megestrol  200 mg Oral Daily  . moxifloxacin  400 mg Oral q1800  . nystatin   Topical QID  . pantoprazole  40 mg Oral Q1200  . potassium chloride  40 mEq Oral BID  . DISCONTD: albuterol  2.5 mg Nebulization QID  . DISCONTD: albuterol  2.5 mg Nebulization TID  . DISCONTD: ipratropium  0.5 mg Nebulization QID  . DISCONTD: ipratropium  0.5 mg Nebulization QID   Continuous Infusions:    . dextrose 5 % and 0.45% NaCl 75 mL/hr at 02/24/11 0923   PRN Meds:.acetaminophen, alum & mag hydroxide-simeth, bisacodyl, chlorpheniramine-HYDROcodone, magic mouthwash, ondansetron (ZOFRAN) IV, ondansetron, phenol, senna-docusate, traMADol  Ht: 5\' 10"  (177.8 cm)  Wt: 142 lb 13.7 oz (64.8 kg)  Ideal Wt: 68.5 kg 73 kg (161#) % Ideal Wt: 89%  Usual Wt: 145-150# % Usual Wt: 98%  Body mass index is 20.50 kg/(m^2).  Food/Nutrition Related Hx: Pt reports decr appetite since hip replacement August 6th. Also has had recent swallow difficulty. Bedside swallow study completed and MBSS pending. He says that his nausea is better currently.  He is tol Dys 3 diet with thin liquids. Reports to have eaten ~50% of his lunch but appetite still not at it's usual level. He denies significant wt loss; states  that the most he has ever weighed is 150#. He reports to be  unable to tol oral suppl such as Ensure but is amiable to try [employer] and ProStat to assist with meeting nutritional needs given his poor appetite. I suspect he is at least moderately malnourished even though he denies significant wt loss. 02-16-11 Pt says his stomach is hurting currently. He spouse is here and says that he's a "very picky" eater. He did walk earlier and ate 75% breakfast. At lunch he had jello, soup and juice. He doesn't like the [facility] suppl; will d/c. Inadequate oral intake cont. but he is willing to try to incr protein-type foods. Therefore, we'll send cottage cheese and soft fruit for snack in evening and double portion of eggs daily with breakfast. 02-21-11 Pt up in chair today he says his stomach is feeling better but his throat and tongue are very sore. He is receiving Magic Mouthwash. He is scheduled for MBSS today. His diet has changed to Lactose Restricted. He ate better yesterday 40% lunch and 90% dinner and today he ate 100% breakfast per nursing. His wife has been bringing Ensure Clear in since pt doesn't like the comparable [employer] product. Cottage cheese d/c with Lactose Restriction. Inadequate oral intake slowly improving. Will follow results of MBSS. Rec consider trial of puree foods due to mouth pain. 02-24-11 Pt chart reviewed. Inadequate oral intake slowly improving. He was seen by  ST on 9/25 diet downgraded to Dys 3- He is tol well with variable po intake between 10-85% meals. He is also receiving multiple oral suppl and reports that he is eating and drinking as much as he can and understands the importance of his nutritional intake as part of recovery process. His renal function is improving per MD and plans are for him to d/c home once he is cleared by Nephrologist. Rec cont with current nutr plan.    BMET    Component Value Date/Time   NA 134* 02/24/2011 0537   K 3.2* 02/24/2011 0537   CL 103 02/24/2011 0537   CO2 21 02/24/2011 0537   GLUCOSE 114*  02/24/2011 0537   BUN 22 02/24/2011 0537   CREATININE 2.36* 02/24/2011 0537   CALCIUM 8.1* 02/24/2011 0537   GFRNONAA 26* 02/24/2011 0537   GFRAA 32* 02/24/2011 0537   CBC    Component Value Date/Time   WBC 8.2 02/24/2011 0537   RBC 3.34* 02/24/2011 0537   HGB 9.1* 02/24/2011 0537   HCT 27.2* 02/24/2011 0537   PLT 201 02/24/2011 0537   MCV 81.4 02/24/2011 0537   MCH 27.2 02/24/2011 0537   MCHC 33.5 02/24/2011 0537   RDW 16.6* 02/24/2011 0537   LYMPHSABS 1.0 02/10/2011 1541   MONOABS 0.7 02/10/2011 1541   EOSABS 0.1 02/10/2011 1541   BASOSABS 0.0 02/10/2011 1541     I/O last 3 completed shifts: In: 5250 [P.O.:660; I.V.:4590] Out: 2425 [Urine:2425]  Diet Order: Lactose Restricted-Dys 3 with thin liquad  Supplements/Tube Feeding:Prostat 30 ml TID, Nepro TID  IVF:     dextrose 5 % and 0.45% NaCl Last Rate: 75 mL/hr at 02/24/11 0923    Estimated Nutritional Needs:   Kcal:1950-2145 Protein:72-85 grams Fluid:2.0-2.2 L/d  NUTRITION DIAGNOSIS: -Inadequate oral intake (NI-2.1).  Status: Ongoing  RELATED TO:  -decreased appetite  AS EVIDENCE BY:  -Pt report and observed meal intake 0-50% po's, FTT  MONITORING/EVALUATION(Goals): -Pt will improve oral intake to meet >75% of est energy and protein needs. Goal not met. -He will maintain current wt ~143#. -Monitor po's, labs, wts   EDUCATION NEEDS: -Education not appropriate at this time  INTERVENTION: -ProStat 30 ml TID between meals  02-16-11 -D/C Breeze BID; pt doesn't like the taste. -Double portion eggs daily for increased protein. -Cottage cheese and soft fruit. D/c  02-21-11 -Megace for appetite -Follow MBSS results -Cont ProStat TID -Trial of Puree foods due to mouth pain?  02-24-11 -Cont with current nutr plan   Dietitian [phone]  DOCUMENTATION CODES Per approved criteria  -Non-severe (moderate) malnutrition in the context of acute illness or injury    Francene Boyers 02/24/2011, 2:26 PM

## 2011-02-24 NOTE — Progress Notes (Signed)
Physical Therapy Treatment Patient Name: Jorge Nixon Jorge Nixon Date: 02/24/2011 TIME:1040-1100/ 1 TE 1 GT Problem List:  Patient Active Problem List  Diagnoses  . PNA (pneumonia)  . Dysphagia  . Anemia  . Weakness generalized  . S/P hip replacement right  . Pain, joint, hip, right  . Hypokalemia  . Protein-calorie malnutrition, severe  . Acute renal failure   Past Medical History:  Past Medical History  Diagnosis Date  . Hypertension   . Diverticular disease 02/14/2011    problems swallowing  . GERD (gastroesophageal reflux disease)   . Coronary artery disease   . Kidney stone   . Hypothyroidism   . Pneumonia   . Anemia    Past Surgical History:  Past Surgical History  Procedure Date  . Total hip arthroplasty august 6th 2012    right side   . Replacement total knee 2006    left knee  . Transurethral resection of prostate 1994 or 1995   Precautions/Restrictions  Precautions Precautions: Anterior Hip Restrictions Weight Bearing Restrictions: No Mobility (including Balance) Bed Mobility Supine to Sit: 4: Min assist Supine to Sit Details (indicate cue type and reason): assistance with LEs Sitting - Scoot to Edge of Bed: 6: Modified independent (Device/Increase time) (EO chair) Transfers Transfers: Yes Sit to Stand: 5: Supervision Stand to Sit: 5: Supervision Ambulation/Gait Ambulation/Gait: Yes Ambulation/Gait Assistance Details (indicate cue type and reason): vcs for upright posture and stay closer to RW Ambulation Distance (Feet): 150 Feet Assistive device: Rolling walker Gait Pattern: Trunk flexed;Shuffle Gait velocity: slow    Exercise  Total Joint Exercises Hip ABduction/ADduction: Seated;Both;20 reps Long Arc Quad: Both;Seated;10 reps General Exercises - Lower Extremity Long Arc Quad: Both;Seated;10 reps Hip ABduction/ADduction: Seated;Both;20 reps Toe Raises: Both;20 reps;Seated Heel Raises: Seated;Both (20 reps) Low Level/ICU Exercises Hip  ABduction/ADduction: Seated;Both;20 reps  End of Session PT - End of Session Equipment Utilized During Treatment: Gait belt (RW) Activity Tolerance: Patient limited by fatigue Patient left: in bed;with call bell in reach;with bed alarm set;with family/visitor present General Behavior During Session: [facility] for tasks performed Cognition: [facility] for tasks performed PT Assessment/Plan  PT - Assessment/Plan Comments on Treatment Session: Pt became fatigued and c/o "swimmy headed and nauseous" feeling after completing seated exercises;5 min seated rest break;them ambulated 150' with c/o of dizziness and headache upon completetion PT Goals  Acute Rehab PT Goals PT Goal: Supine/Side to Sit - Progress: Progressing toward goal PT Goal: Sit to Supine/Side - Progress: Progressing toward goal PT Goal: Ambulate - Progress: Progressing toward goal  Jorge Nixon ATKINSO 02/24/2011, 11:16 AM

## 2011-02-24 NOTE — Progress Notes (Signed)
Subjective ;Patient continues to complain of anorexia, soreness is in  his mouth; he remains with poor appetite. He is trying to drink nutritional supplements but states he develops bloating he had one loose stool today; he denies melena or rectal bleeding. Today is complaining of discomfort in left lower quadrant of his abdomen. He did walk to the nursing station when he had his physical therapy. Objective; BP 149/79  Pulse 93  Temp(Src) 99.5 F (37.5 C) (Oral)  Resp 20  Ht 5\' 10"  (1.778 m)  Wt 142 lb 13.7 oz (64.8 kg)  BMI 20.50 kg/m2  SpO2 95% He does not appear to be in any distress. His abdomen is symmetrical. Bowel sounds are hyperactive.[name] abdomen is soft with mild tenderness at LLQ. No inguinal hernia noted. Lab data. WBC 8.2. Hemoglobin 9.1. Hematocrit 27.2. Serum potassium is 3.2. BUN is 22. Creatinine 2.36. Assessment; Patient remains with multiple symptoms. His dysphagia has improved significantly. Renal function is also improving as evidenced by drop in serum creatinine. He is now off his antibiotic and hopefully this would ease his abdominal pain and improve his appetite. His hemoglobin is drifting down; there is no evidence of active bleeding. This drop is felt to be multifactorial but primarily secondary to acute illness. Malnutrition and poor oral intake slowing down his recovery Dr. Lendell Caprice has is increased Megace dose. Condition reviewed with Dr. Lendell Caprice. No new GI recommendations at this time

## 2011-02-25 LAB — BASIC METABOLIC PANEL
CO2: 24 mEq/L (ref 19–32)
Calcium: 8.5 mg/dL (ref 8.4–10.5)
GFR calc non Af Amer: 29 mL/min — ABNORMAL LOW (ref >60)
Potassium: 4 mEq/L (ref 3.5–5.1)
Sodium: 136 mEq/L (ref 135–145)

## 2011-02-25 MED ORDER — IRON 325 (65 FE) MG PO TABS: 1.0000 | ORAL_TABLET | Freq: Every day | ORAL | Status: DC

## 2011-02-25 MED ORDER — ASCORBIC ACID 500 MG PO TABS: 500.0000 mg | ORAL_TABLET | Freq: Every day | ORAL | Status: AC

## 2011-02-25 MED ORDER — OMEPRAZOLE 20 MG PO CPDR: 40.0000 mg | DELAYED_RELEASE_CAPSULE | Freq: Every day | ORAL | Status: DC

## 2011-02-25 MED ORDER — GUAIFENESIN-DM 100-10 MG/5ML PO SYRP: 5.0000 mL | ORAL_SOLUTION | Freq: Three times a day (TID) | ORAL | Status: AC | PRN

## 2011-02-25 MED ORDER — MAGIC MOUTHWASH: 10.0000 mL | Freq: Three times a day (TID) | ORAL | Status: DC | PRN

## 2011-02-25 MED ORDER — MEGESTROL ACETATE 400 MG/10ML PO SUSP: 400.0000 mg | Freq: Every day | ORAL | Status: DC

## 2011-02-25 NOTE — Discharge Summary (Signed)
Physician Discharge Summary  Patient ID: Jorge Nixon MRN: [id number] DOB/AGE: 1925/05/26 77 y.o.  Admit date: 02/10/2011 Discharge date: 02/25/2011  Discharge Diagnoses:  Principal Problem:  *PNA (pneumonia) Active Problems:  Acute renal failure  Dysphagia  Anemia of chronic disease  Hypokalemia  Protein-calorie malnutrition, severe  Esophageal dysmotility  Mouth pain  Weakness generalized  S/P hip replacement right  Esophageal web   Current Discharge Medication List    START taking these medications   Details  Alum & Mag Hydroxide-Simeth (MAGIC MOUTHWASH) SOLN Take 10 mLs by mouth 3 (three) times daily as needed (mouth pain). Qty: 200 mL, Refills: 0    guaiFENesin-dextromethorphan (ROBITUSSIN DM) 100-10 MG/5ML syrup Take 5 mLs by mouth 3 (three) times daily as needed for cough. Qty: 118 mL, Refills: 0    megestrol (MEGACE) 400 MG/10ML suspension Take 10 mLs (400 mg total) by mouth daily. Qty: 240 mL, Refills: 0    omeprazole (PRILOSEC) 20 MG capsule Take 2 capsules (40 mg total) by mouth daily. Qty: 30 capsule, Refills: 0    vitamin C (VITAMIN C) 500 MG tablet Take 1 tablet (500 mg total) by mouth daily.      CONTINUE these medications which have CHANGED   Details  Ferrous Sulfate (IRON) 325 (65 FE) MG TABS Take 1 tablet by mouth daily.      CONTINUE these medications which have NOT CHANGED   Details  acetaminophen (TYLENOL) 500 MG tablet Take 500 mg by mouth every 6 (six) hours as needed.      levothyroxine (SYNTHROID, LEVOTHROID) 125 MCG tablet Take 62.5 mcg by mouth daily.        STOP taking these medications     levofloxacin (LEVAQUIN) 500 MG tablet      aspirin 81 MG tablet      enoxaparin (LOVENOX) 40 MG/0.4ML SOLN      gabapentin (NEURONTIN) 100 MG capsule      losartan-hydrochlorothiazide (HYZAAR) 50-12.5 MG per tablet      traMADol (ULTRAM) 50 MG tablet         Discharge Orders    Future Orders Please Complete By Expires   Diet general       Scheduling Instructions:   Soft   Discharge instructions      Comments:   Drink plenty of fluids   Activity as tolerated - No restrictions         Follow-up Information    [facility] with [facility] [phone].      Follow up with HALL,ZACK in 1 week.   Contact information:   [address] [place] [address] [phone]          Disposition: Home or Self Care  Discharged Condition: Stable  Consults: Treatment Team:  Malissa Hippo, MD Jamse Mead  Labs:   Results for orders placed during the hospital encounter of 02/10/11 (from the past 48 hour(s))  BASIC METABOLIC PANEL     Status: Abnormal   Collection Time   02/24/11  5:37 AM      Component Value Range Comment   Sodium 134 (*) 135 - 145 (mEq/L)    Potassium 3.2 (*) 3.5 - 5.1 (mEq/L)    Chloride 103  96 - 112 (mEq/L)    CO2 21  19 - 32 (mEq/L)    Glucose, Bld 114 (*) 70 - 99 (mg/dL)    BUN 22  6 - 23 (mg/dL)    Creatinine, Ser 7.84 (*) 0.50 - 1.35 (  mg/dL)    Calcium 8.1 (*) 8.4 - 10.5 (mg/dL)    GFR calc non Af Amer 26 (*) >60 (mL/min)    GFR calc Af Amer 32 (*) >60 (mL/min)   CBC     Status: Abnormal   Collection Time   02/24/11  5:37 AM      Component Value Range Comment   WBC 8.2  4.0 - 10.5 (K/uL)    RBC 3.34 (*) 4.22 - 5.81 (MIL/uL)    Hemoglobin 9.1 (*) 13.0 - 17.0 (g/dL)    HCT 78.2 (*) 95.6 - 52.0 (%)    MCV 81.4  78.0 - 100.0 (fL)    MCH 27.2  26.0 - 34.0 (pg)    MCHC 33.5  30.0 - 36.0 (g/dL)    RDW 21.3 (*) 08.6 - 15.5 (%)    Platelets 201  150 - 400 (K/uL)   PHOSPHORUS     Status: Normal   Collection Time   02/24/11  5:37 AM      Component Value Range Comment   Phosphorus 2.7  2.3 - 4.6 (mg/dL)   BASIC METABOLIC PANEL     Status: Abnormal   Collection Time   02/25/11  4:49 AM      Component Value Range Comment   Sodium 136  135 - 145 (mEq/L)    Potassium 4.0  3.5 - 5.1 (mEq/L) DELTA CHECK NOTED   Chloride 103  96 - 112 (mEq/L)    CO2 24  19 - 32  (mEq/L)    Glucose, Bld 104 (*) 70 - 99 (mg/dL)    BUN 21  6 - 23 (mg/dL)    Creatinine, Ser 5.78 (*) 0.50 - 1.35 (mg/dL)    Calcium 8.5  8.4 - 10.5 (mg/dL)    GFR calc non Af Amer 29 (*) >60 (mL/min)    GFR calc Af Amer 35 (*) >60 (mL/min)    Liver function tests significant for an albumin of 2.5. Creatinine on admission was 0.81. It peaked at 3.22 and is decreasing back down toward normal. Serum protein electrophoresis negative for M spike. I N. 18 TIBC low at 150 ferritin 4:30. Folate B12 normal. Cosyntropin stimulation test normal TSH 4.3 ANA negative. PSA 1. CEA 0.7. CA 19-9 is 9.1, normal. Urinalysis negative except for specific gravity of greater than 1.030. Free kappa light chains and lambda chains were elevated. Sputum culture grew out Candida. Urine culture negative. C. difficile PCR negative. Hemoccult of the stool negative. proBNP on admission was 1046 troponin negative  Echocardiogram:Study Conclusions  - Left ventricle: The cavity size was normal. Wall thickness was normal. Systolic function was mildly reduced. The estimated ejection fraction was in the range of 45% to 50%. Possible hypokinesis of the mid-distalinferoseptal myocardium. Doppler parameters are consistent with abnormal left ventricular relaxation (grade 1 diastolic dysfunction). - Aortic valve: Trivial regurgitation. - Mitral valve: Calcified annulus. Trivial regurgitation. - Tricuspid valve: Trivial regurgitation. - Pericardium, extracardiac: There was no pericardial effusion. Transthoracic echocardiography. M-mode, complete 2D, spectral Doppler, and color Doppler. Height: Height: 177.8cm. Height: 70in. Weight: Weight: 64.4kg. Weight: 141.7lb. Body mass index: BMI: 20.4kg/m^2. Body surface area: BSA: 1.81m^2. Patient status: Inpatient. Location: Bedside.    Diagnostics:  Ct Abdomen Pelvis Wo Contrast  02/17/2011  *RADIOLOGY REPORT*  Clinical Data: Mid abdominal pain, GERD, hypertension, history kidney  stones, coronary disease, right hip replacement  CT ABDOMEN AND PELVIS WITHOUT CONTRAST  Technique:  Multidetector CT imaging of the abdomen and pelvis was performed following the standard protocol  without intravenous contrast. Sagittal and coronal MPR images reconstructed from axial data set.  IV contrast not utilized due to renal dysfunction.  Comparison: 07/02/2005  Findings: Small bibasilar pleural effusions with atelectasis versus infiltrate at both lung bases Portions of the left lower lobe opacity are somewhat mass-like in appearance. Calcified granulomata in liver. Probable cyst medial aspect upper pole right kidney, 1.9 x 1.5 cm image 24. Nonobstructing calculus lower pole left kidney 4 mm diameter image 35. Large peripelvic cyst left kidney, 4.1 x 4.4 cm, increased. Remainder of the liver, spleen, pancreas, kidneys, and adrenal glands normal. Diverticula at second portion of duodenum. Extensive atherosclerotic calcifications with mild aneurysmal dilatation of infrarenal abdominal aorta 3.0 x 2.7 cm image 38. Common iliac arteries dilated bilaterally, 1.4 cm diameter left 1.5 cm right. Scattered colonic diverticuli without evidence of diverticulitis. Normal appendix. Stomach remaining bowel loops unremarkable. Right hip prosthesis with beam hardening artifacts obscuring portions of pelvis. Dilated inguinal rings and proximal left inguinal canal raising question of inguinal hernias. No mass, adenopathy, free fluid or inflammatory process. Tiny periumbilical hernia containing fat. No free intraperitoneal air. Diffuse osseous demineralization.  IMPRESSION: Bibasilar effusions and atelectasis versus infiltrate. Due to somewhat mass-like appearance of opacity at the left lower lobe, recommend radiographic follow-up until resolution. Bilateral renal cysts and nonobstructing left renal calculus. Colonic diverticulosis without diverticulitis. Small abdominal aortic aneurysm 3 cm diameter. Tiny periumbilical hernia.  No definite acute intra abdominal or intrapelvic abnormalities.  Original Report Authenticated By: Lollie Marrow, M.D.   Dg Chest 2 View  02/10/2011  *RADIOLOGY REPORT*  Clinical Data: Rule out infiltrate  CHEST - 2 VIEW  Comparison: 02/08/2011  Findings: Cardiomediastinal silhouette is stable.  Central bronchitic changes again noted.  Patchy basilar infiltrate again noted.  There is patchy infiltrate in the right upper lobe.  No pulmonary edema.  IMPRESSION: Patchy infiltrates right upper lobe and bilateral basilar.  No pulmonary edema.  Original Report Authenticated By: Natasha Mead, M.D.   Dg Chest 2 View  02/08/2011  *RADIOLOGY REPORT*  Clinical Data: Left lower chest pain, recent hip replacement and pneumonia  CHEST - 2 VIEW  Comparison: 01/07/2011  Findings: Normal heart size and pulmonary vascularity. Calcified tortuous aorta. Question minimal bibasilar infiltrate, little changed on left and new on the right. Underlying emphysematous and chronic bronchitic changes. Remaining lungs clear. No pleural effusion or pneumothorax. Bones demineralized with multilevel endplate spur formation thoracic spine.  IMPRESSION: Emphysematous and chronic bronchitic changes. Mild bibasilar infiltrates question pneumonia.  Original Report Authenticated By: Lollie Marrow, M.D.   Ct Chest Wo Contrast  02/15/2011  *RADIOLOGY REPORT*  Clinical Data: Pneumonia failing to improve, continued cough, low grade fever  CT CHEST WITHOUT CONTRAST  Technique:  Multidetector CT imaging of the chest was performed following the standard protocol without IV contrast. Sagittal and coronal MPR images reconstructed from axial data set.  IV contrast not utilized due to renal dysfunction  Comparison: 03/17/2008  Findings: Scattered atherosclerotic calcifications aorta and coronary arteries. Calcified granulomata and liver. Remaining visualized portions of upper abdomen unremarkable. Small bilateral pleural effusions. Scattered normal-sized  mediastinal lymph nodes. Patchy areas of airspace infiltrate are identified bilateral upper lobes, lingula, and right middle lobe. Minimal infiltrate superior segment left lower lobe. Atelectasis and potentially infiltrate at base of left lower lobe. Increased pleural parenchymal opacity at the posterior aspect of the left apex, may represent infiltrate but underlying mass not completely excluded. Bones appear demineralized.  IMPRESSION: Patchy areas of consolidation are identified in  the the right upper lobe, right middle lobe and lingula, with minimal infiltrate superior segment left lower lobe. More focal area of pleuroparenchymal opacity at posterior aspect of left upper lobe. Follow-up examinations recommended until resolution to exclude mass lesions at the any of these sites. Small bilateral pleural effusions. Scattered atherosclerotic disease changes.  Original Report Authenticated By: Lollie Marrow, M.D.   US Abdomen Complete  02/20/2011  *RADIOLOGY REPORT*  Clinical Data:  Right upper abdominal pain  COMPLETE ABDOMINAL ULTRASOUND  Comparison:  CT 02/17/2011 and earlier studies  Findings:  Gallbladder:  No gallstones, gallbladder wall thickening, or pericholecystic fluid. Sonographer reports no sonographic Murphy's sign.  Common bile duct:  9.3 mm in diameter, without evidence of choledocholithiasis.  Liver:  Focal calcification in the posterior right hepatic segment corresponding granuloma on prior CT.  No new lesion is evident.  No intrahepatic biliary ductal dilatation.  Background parenchyma unremarkable.  IVC:  Appears normal.  Pancreas:  No focal abnormality seen.  Spleen:  6.9 cm craniocaudal length., unremarkable  Right Kidney:  10.3 cm.  No hydronephrosis.  15 x 17 x 20 mm cyst in the     interpolar region, relatively stable since scans dating back to 07/02/2005.  Left Kidney:  12.3 cm.  No hydronephrosis.  3.7 cm parapelvic cyst stable.  Abdominal aorta:  Atheromatous, dilated up to 3.1 cm.   Right common iliac artery measures up to 14 mm diameter.  IMPRESSION:  1.  Normal gallbladder. 2.  Stable small infrarenal aortic aneurysm. 3.  Stable bilateral renal cysts.  Original Report Authenticated By: Osa Craver, M.D.   Dg Esophagus  02/21/2011  *RADIOLOGY REPORT*  Clinical Data: Dysphagia.  Sore throat.  ESOPHOGRAM / BARIUM SWALLOW  Technique:  Combined double contrast and single contrast examination performed using thin barium liquid.  Fluoroscopy time:  3.4 minutes.  Comparison: 02/15/2011 chest CT  Findings:  Primary peristaltic waves in the esophagus were disrupted on 2/4 swallows, with occasional secondary and tertiary contractions noted.  The patient was unable to stand upright or assume standard RAO positioning.  We performed the exam in the LPO position using thin barium only.  Effervescent crystals were not utilized.  Because the patient was unable to drink quickly, we were unable to fully distend the distal most esophagus.  Series 23 best shows the maximum distended appearance of the esophagus.  A 13 mm barium tablet transiently impacted for about 2 minutes at this level.  We administered water to help clear the tablet, and the patient experience coughing immediately after drinking water, suggesting that he may not be completely protecting his airway.  IMPRESSION:  1.  Primary peristaltic waves were disrupted on 2/4 swallows, borderline for nonspecific esophageal dysmotility.  He did have several episodes of secondary and tertiary contractions of the esophagus. 2.  We were only able to distend the distal esophagus to about 13 mm, and a 13 mm barium tablet transiently impacted in the distal esophagus. However, the patient was only able to piecemeal swallow, and thus the reduced esophageal diameter distally may simply be from an adequate distention rather than true narrowing/stricturing. 3.  The patient did experience coughing immediately after drinking water, raising the possibility  that he is not completely protecting his airway.  Original Report Authenticated By: Dellia Cloud, M.D.   US Renal  02/17/2011  *RADIOLOGY REPORT*  Clinical Data: Worsening renal function, history hypertension, kidney stones  RENAL/URINARY TRACT ULTRASOUND COMPLETE  Comparison:  None Correlation:  CT  abdomen 02/17/2011  Findings:  Right Kidney:  10.7 cm length.  Cortical thinning.  Upper normal cortical thickness.  Question small cyst upper pole 1.1 x 0.5 x 1.4 cm.  No additional mass, hydronephrosis or shadowing calcification.  Left Kidney:  11.5 cm length.  Cortical thinning.  Upper normal cortical echogenicity.  Peripelvic cyst 3.4 x 4.2 x 3.7 cm, as noted on prior CT.  No additional mass, hydronephrosis or shadowing calcification.  Shadowing calculus seen at lower pole on the CT not definitely visualized sonographically.  Bladder:  Well-distended, normal appearance.  Prostate gland appears enlarged, 4.4 x 5.6 x 3.7 cm with calculated volume of 47.3 ml.  IMPRESSION: Bilateral renal cortical thinning. Bilateral renal cysts. Prostatic enlargement.  Original Report Authenticated By: Lollie Marrow, M.D.   Dg Chest Portable 1 View  02/13/2011  *RADIOLOGY REPORT*  Clinical Data: Pneumonia, anemia, weakness, productive cough  PORTABLE CHEST - 1 VIEW  Comparison: 02/10/2011; 02/08/2011; 01/07/2011  Findings: Grossly unchanged cardiac silhouette and mediastinal contours.  Grossly unchanged patchy heterogeneous opacities within the right mid/upper lung, and medial aspect of the bilateral lung bases, left greater than right.  No new focal airspace opacities. A skin fold overlies the peripheral aspect of the right mid lung. No pneumothorax.  There is persistent minimal blunting of the right costophrenic angle without definite pleural effusion.  Grossly unchanged bones.  IMPRESSION:  Grossly unchanged bilateral heterogeneous opacities worrisome for multifocal infection.  Follow-up to resolution is recommended.   Original Report Authenticated By: Waynard Reeds, M.D.   EKG: NSR  Hospital Course: Please see H&P for complete admission details. The patient was admitted with weakness cough low-grade fever. He had suffered a recent hip fracture and this was repaired and he was sent to rehabilitation. He had been discharged and was given a prescription for Levaquin 3 days prior to admission. He was also complaining of dysphasia and odynophasia. He was afebrile and had unremarkable vital signs. However, he did have dry mucous membranes. His lungs were clear. He had a bilateral pneumonia on chest x-ray. He was admitted to the hospitalist service and started on vancomycin and Zosyn for treatment of healthcare associated pneumonia. There was also concern of aspiration pneumonia and speech therapy was consulted. Patient was found to be anemic on initial laboratories. Hemoccult of the stool was negative but GI was consulted. He had an EGD which showed bulbar duodenitis and a large duodenal diverticulum. He had a normal esophagus but dilatation with dilator resulted in mucosal disruption of the proximal esophagus indicative of an esophageal web. He also was found to have a 2 cm size sliding-type hiatal hernia. Patient complained of mouth pain and was given Magic mouthwash. His appetite continued to be poor and he continued to come plane of vague migratory abdominal pain. Started on Megace. Workup for the pain showed nothing concerning. I suspect his GI complaints may have been in part due to the antibiotics. He has had a 14 day course of antibiotics and these have been stopped. At the time of discharge, his appetite is improved. He still complaining of mouth pain. He may have a mucositis from malnutrition, namely vitamin C. A vitamin C level is pending but I started him on vitamin C supplementation. He may continue to take the Magic mouthwash as needed. He also appears very depressed and has been hospitalized on and off for the past  2 months. I suspect this is contributing to his port intake. I do believe that he will  thrive better at home. His creatinine is not yet back to baseline but he appears euvolemic on examination. I therefore feel that he would actually do better at home. Both he and his wife agree. He will be sent home with a nurse and a physical therapist. He was evaluated here in the hospital with physical therapy. He was able to ambulate 400 feet with assistance. He would not meet criteria for inpatient rehabilitation again.  His creatinine rose to above 3 which was most likely multifactorial, hypotension, antibiotics, Hyzaar, prerenal azotemia. This improved with IV fluids and has continued to improve. Ultrasound showed no hydronephrosis.  He will be discharged on omeprazole. Change iron sulfate to once daily to improve his appetite. Stop his diuretics. Total time on the day of discharge is greater than 30 minutes.  Discharge Exam: Blood pressure 158/87, pulse 103, temperature 98.4 F (36.9 C), temperature source Oral, resp. rate 18, height 5\' 10"  (1.778 m), weight 64.8 kg (142 lb 13.7 oz), SpO2 90.00%.  Exam unchanged from 02/24/2011   Signed: Crista Curb L 02/25/2011, 10:28 AM

## 2011-02-25 NOTE — Progress Notes (Signed)
Subjective: Interval History: has complaints recurrent cough and the  poor nutrition. He doesn't have any nausea vomiting however his appetite still very poor.[name] His cough is the nonproductive . Patient still complains of  difficulty in swallowing.  Objective: Vital signs in last 24 hours: Temp:  [98.4 F (36.9 C)-99.4 F (37.4 C)] 98.4 F (36.9 C) (09/28 0615) Pulse Rate:  [97-103] 103  (09/28 0615) Resp:  [18] 18  (09/28 0615) BP: (138-158)/(78-87) 158/87 mmHg (09/28 0615) SpO2:  [90 %-96 %] 90 % (09/28 0741) FiO2 (%):  [21 %] 21 % (09/28 0741) Weight change:   Intake/Output from previous day: 09/27 0701 - 09/28 0700 In: 1086.8 [P.O.:360; I.V.:726.8] Out: 1525 [Urine:1525] Intake/Output this shift:    General appearance: alert Resp: clear to auscultation bilaterally Cardio: regular rate and rhythm, S1, S2 normal, no murmur, click, rub or gallop GI: soft, non-tender; bowel sounds normal; no masses,  no organomegaly Extremities: extremities normal, atraumatic, no cyanosis or edema  Lab Results:  [facility] 02/24/11 0537 02/23/11 0523  WBC 8.2 7.4  HGB 9.1* 9.3*  HCT 27.2* 27.8*  PLT 201 210   BMET:  Basename 02/25/11 0449 02/24/11 0537  NA 136 134*  K 4.0 3.2*  CL 103 103  CO2 24 21  GLUCOSE 104* 114*  BUN 21 22  CREATININE 2.16* 2.36*  CALCIUM 8.5 8.1*   No results found for this basename: PTH:2 in the last 72 hours Iron Studies: No results found for this basename: IRON,TIBC,TRANSFERRIN,FERRITIN in the last 72 hours  Studies/Results: No results found.  I have reviewed the patient's current medications.  Assessment/Plan: Problem #1 acute on chronic  renal failure presently is pending creatinine seems to be improving progressively. Patient is none oliguric. Problem #2 history of hypokalemia patient on potassium supplement potassium is 4 corrected  Problem #3 history of  anemia possibly combination of iron deficiency and anemia of chronic disease. His hemoglobin  and hematocrit is stable. Problem #4 history of  pneumonia presently still had complaints of cough nonproductive  Problem #5 history of  dysphagia patient is still accomplice of  difficulty swallowing. He denies any nausea vomiting. However his appetite is still very poor . Problem #6 history of  right hip her replacement Problem #7 history of  protein calorie malnutrition patient presently on Nepro he seems to be tolerating. Her recommendation I will continue his IV fluid her at present rate We'll check his basic metabolic panel CBC and phosphorus in the morning. And this is end of  followup thank you    LOS: 15 days   Blossom Crume S 02/25/2011,8:09 AM

## 2011-03-04 LAB — VITAMIN C: Vitamin C: 0.22 mg/dL (ref 0.20–1.90)

## 2011-04-18 ENCOUNTER — Ambulatory Visit (HOSPITAL_COMMUNITY)
Admission: RE | Admit: 2011-04-18 | Discharge: 2011-04-18 | Disposition: A | Discharge status: Home / Self Care | Payer: Medicare Other | Source: Ambulatory Visit | Attending: Internal Medicine | Admitting: Internal Medicine

## 2011-04-18 ENCOUNTER — Other Ambulatory Visit (HOSPITAL_COMMUNITY): Payer: Self-pay | Admitting: Internal Medicine

## 2011-04-18 DIAGNOSIS — R0602 Shortness of breath: Secondary | ICD-10-CM | POA: Insufficient documentation

## 2011-04-18 DIAGNOSIS — Z8701 Personal history of pneumonia (recurrent): Secondary | ICD-10-CM | POA: Insufficient documentation

## 2011-07-07 DIAGNOSIS — M1610 Unilateral primary osteoarthritis, unspecified hip: Secondary | ICD-10-CM | POA: Diagnosis not present

## 2011-08-03 DIAGNOSIS — L089 Local infection of the skin and subcutaneous tissue, unspecified: Secondary | ICD-10-CM | POA: Diagnosis not present

## 2011-08-03 DIAGNOSIS — L723 Sebaceous cyst: Secondary | ICD-10-CM | POA: Diagnosis not present

## 2011-08-16 DIAGNOSIS — M1610 Unilateral primary osteoarthritis, unspecified hip: Secondary | ICD-10-CM | POA: Diagnosis not present

## 2011-08-19 DIAGNOSIS — R5382 Chronic fatigue, unspecified: Secondary | ICD-10-CM | POA: Diagnosis not present

## 2011-08-19 DIAGNOSIS — E039 Hypothyroidism, unspecified: Secondary | ICD-10-CM | POA: Diagnosis not present

## 2011-08-19 DIAGNOSIS — R5381 Other malaise: Secondary | ICD-10-CM | POA: Diagnosis not present

## 2011-08-22 ENCOUNTER — Other Ambulatory Visit (HOSPITAL_COMMUNITY): Payer: Self-pay | Admitting: Preventative Medicine

## 2011-08-22 DIAGNOSIS — R9389 Abnormal findings on diagnostic imaging of other specified body structures: Secondary | ICD-10-CM

## 2011-08-23 ENCOUNTER — Ambulatory Visit (HOSPITAL_COMMUNITY)
Admission: RE | Admit: 2011-08-23 | Discharge: 2011-08-23 | Disposition: A | Discharge status: Home / Self Care | Payer: Medicare Other | Source: Ambulatory Visit | Attending: Preventative Medicine | Admitting: Preventative Medicine

## 2011-08-23 DIAGNOSIS — R911 Solitary pulmonary nodule: Secondary | ICD-10-CM | POA: Insufficient documentation

## 2011-08-23 DIAGNOSIS — J4489 Other specified chronic obstructive pulmonary disease: Secondary | ICD-10-CM | POA: Diagnosis not present

## 2011-08-23 DIAGNOSIS — R918 Other nonspecific abnormal finding of lung field: Secondary | ICD-10-CM | POA: Diagnosis not present

## 2011-08-23 DIAGNOSIS — J449 Chronic obstructive pulmonary disease, unspecified: Secondary | ICD-10-CM | POA: Diagnosis not present

## 2011-08-23 DIAGNOSIS — J984 Other disorders of lung: Secondary | ICD-10-CM | POA: Diagnosis not present

## 2011-08-23 DIAGNOSIS — R9389 Abnormal findings on diagnostic imaging of other specified body structures: Secondary | ICD-10-CM

## 2011-08-23 MED ORDER — IOHEXOL 300 MG/ML  SOLN
80.0000 mL | Freq: Once | INTRAMUSCULAR | Status: AC | PRN
  Administered 2011-08-23: 80 mL via INTRAVENOUS

## 2011-08-26 DIAGNOSIS — R918 Other nonspecific abnormal finding of lung field: Secondary | ICD-10-CM | POA: Diagnosis not present

## 2011-09-02 ENCOUNTER — Other Ambulatory Visit (HOSPITAL_COMMUNITY): Payer: Self-pay | Admitting: Internal Medicine

## 2011-09-02 DIAGNOSIS — R0602 Shortness of breath: Secondary | ICD-10-CM

## 2011-09-05 DIAGNOSIS — I2589 Other forms of chronic ischemic heart disease: Secondary | ICD-10-CM | POA: Diagnosis not present

## 2011-09-05 DIAGNOSIS — R069 Unspecified abnormalities of breathing: Secondary | ICD-10-CM | POA: Diagnosis not present

## 2011-09-05 DIAGNOSIS — J449 Chronic obstructive pulmonary disease, unspecified: Secondary | ICD-10-CM | POA: Diagnosis not present

## 2011-09-06 DIAGNOSIS — Z79899 Other long term (current) drug therapy: Secondary | ICD-10-CM | POA: Diagnosis not present

## 2011-09-06 DIAGNOSIS — R0602 Shortness of breath: Secondary | ICD-10-CM | POA: Diagnosis not present

## 2011-09-07 ENCOUNTER — Ambulatory Visit (HOSPITAL_COMMUNITY)
Admission: RE | Admit: 2011-09-07 | Discharge: 2011-09-07 | Disposition: A | Discharge status: Home / Self Care | Payer: Medicare Other | Source: Ambulatory Visit | Attending: Internal Medicine | Admitting: Internal Medicine

## 2011-09-07 DIAGNOSIS — R0602 Shortness of breath: Secondary | ICD-10-CM | POA: Diagnosis not present

## 2011-09-07 MED ORDER — ALBUTEROL SULFATE (5 MG/ML) 0.5% IN NEBU
2.5000 mg | INHALATION_SOLUTION | Freq: Once | RESPIRATORY_TRACT | Status: AC
  Administered 2011-09-07: 2.5 mg via RESPIRATORY_TRACT

## 2011-09-11 NOTE — Procedures (Signed)
NAME:  CATRELL, MORRONE NO.:  [id number]  MEDICAL RECORD NO.:  [id number]  LOCATION:  RESP                          FACILITY:  [facility]  PHYSICIAN:  Garlen Reinig L. Juanetta Gosling, M.D.DATE OF BIRTH:  Feb 13, 1925  DATE OF PROCEDURE: DATE OF DISCHARGE:  09/07/2011                           PULMONARY FUNCTION TEST   Reason for pulmonary function testing is shortness of breath. 1. Spirometry shows no ventilatory defect, but does show some evidence     of airflow obstruction. 2. Lung volumes are normal. 3. DLCO is severely reduced, but corrects when ventilation is     adjusted. 4. Airway resistance is slightly elevated. 5. There is significant bronchodilator improvement. 6. Please note the technician's notes that it was difficult for the     patient to do spirometry and volume determination, so the results     of the test may not be entirely accurate.     Leonette Tischer L. Juanetta Gosling, M.D.     ELH/MEDQ  D:  09/10/2011  T:  09/11/2011  Job:  [id number]

## 2011-09-16 LAB — PULMONARY FUNCTION TEST

## 2011-09-20 DIAGNOSIS — D649 Anemia, unspecified: Secondary | ICD-10-CM | POA: Diagnosis not present

## 2011-09-20 DIAGNOSIS — I10 Essential (primary) hypertension: Secondary | ICD-10-CM | POA: Diagnosis not present

## 2011-09-20 DIAGNOSIS — E039 Hypothyroidism, unspecified: Secondary | ICD-10-CM | POA: Diagnosis not present

## 2011-09-20 DIAGNOSIS — R0602 Shortness of breath: Secondary | ICD-10-CM | POA: Diagnosis not present

## 2011-10-06 DIAGNOSIS — R069 Unspecified abnormalities of breathing: Secondary | ICD-10-CM | POA: Diagnosis not present

## 2011-10-06 DIAGNOSIS — I428 Other cardiomyopathies: Secondary | ICD-10-CM | POA: Diagnosis not present

## 2011-10-06 DIAGNOSIS — J449 Chronic obstructive pulmonary disease, unspecified: Secondary | ICD-10-CM | POA: Diagnosis not present

## 2011-10-21 DIAGNOSIS — M2550 Pain in unspecified joint: Secondary | ICD-10-CM | POA: Diagnosis not present

## 2011-10-21 DIAGNOSIS — J449 Chronic obstructive pulmonary disease, unspecified: Secondary | ICD-10-CM | POA: Diagnosis not present

## 2011-12-05 DIAGNOSIS — R0602 Shortness of breath: Secondary | ICD-10-CM | POA: Diagnosis not present

## 2011-12-05 DIAGNOSIS — R3911 Hesitancy of micturition: Secondary | ICD-10-CM | POA: Diagnosis not present

## 2011-12-22 DIAGNOSIS — E039 Hypothyroidism, unspecified: Secondary | ICD-10-CM | POA: Diagnosis not present

## 2011-12-22 DIAGNOSIS — E785 Hyperlipidemia, unspecified: Secondary | ICD-10-CM | POA: Diagnosis not present

## 2011-12-22 DIAGNOSIS — D649 Anemia, unspecified: Secondary | ICD-10-CM | POA: Diagnosis not present

## 2011-12-22 DIAGNOSIS — I10 Essential (primary) hypertension: Secondary | ICD-10-CM | POA: Diagnosis not present

## 2012-02-16 DIAGNOSIS — L723 Sebaceous cyst: Secondary | ICD-10-CM | POA: Diagnosis not present

## 2012-02-16 DIAGNOSIS — L570 Actinic keratosis: Secondary | ICD-10-CM | POA: Diagnosis not present

## 2012-02-16 DIAGNOSIS — D044 Carcinoma in situ of skin of scalp and neck: Secondary | ICD-10-CM | POA: Diagnosis not present

## 2012-02-16 DIAGNOSIS — D235 Other benign neoplasm of skin of trunk: Secondary | ICD-10-CM | POA: Diagnosis not present

## 2012-02-17 DIAGNOSIS — Z23 Encounter for immunization: Secondary | ICD-10-CM | POA: Diagnosis not present

## 2012-03-26 DIAGNOSIS — I10 Essential (primary) hypertension: Secondary | ICD-10-CM | POA: Diagnosis not present

## 2012-03-26 DIAGNOSIS — E039 Hypothyroidism, unspecified: Secondary | ICD-10-CM | POA: Diagnosis not present

## 2012-03-26 DIAGNOSIS — D649 Anemia, unspecified: Secondary | ICD-10-CM | POA: Diagnosis not present

## 2012-04-19 DIAGNOSIS — J449 Chronic obstructive pulmonary disease, unspecified: Secondary | ICD-10-CM | POA: Diagnosis not present

## 2012-04-19 DIAGNOSIS — I10 Essential (primary) hypertension: Secondary | ICD-10-CM | POA: Diagnosis not present

## 2012-04-19 DIAGNOSIS — I428 Other cardiomyopathies: Secondary | ICD-10-CM | POA: Diagnosis not present

## 2012-05-03 ENCOUNTER — Ambulatory Visit (HOSPITAL_COMMUNITY)
Admission: RE | Admit: 2012-05-03 | Discharge: 2012-05-03 | Disposition: A | Discharge status: Home / Self Care | Payer: Medicare Other | Source: Ambulatory Visit | Attending: Internal Medicine | Admitting: Internal Medicine

## 2012-05-03 DIAGNOSIS — I079 Rheumatic tricuspid valve disease, unspecified: Secondary | ICD-10-CM | POA: Insufficient documentation

## 2012-05-03 DIAGNOSIS — R0602 Shortness of breath: Secondary | ICD-10-CM | POA: Diagnosis not present

## 2012-05-03 DIAGNOSIS — I059 Rheumatic mitral valve disease, unspecified: Secondary | ICD-10-CM | POA: Diagnosis not present

## 2012-05-03 DIAGNOSIS — R9431 Abnormal electrocardiogram [ECG] [EKG]: Secondary | ICD-10-CM | POA: Diagnosis not present

## 2012-05-03 NOTE — Progress Notes (Signed)
  Echocardiogram 2D Echocardiogram has been performed.  Jorge Nixon M 05/03/2012, 1:44 PM

## 2012-05-16 DIAGNOSIS — R079 Chest pain, unspecified: Secondary | ICD-10-CM | POA: Diagnosis not present

## 2012-06-07 DIAGNOSIS — I10 Essential (primary) hypertension: Secondary | ICD-10-CM | POA: Diagnosis not present

## 2012-09-19 DIAGNOSIS — E039 Hypothyroidism, unspecified: Secondary | ICD-10-CM | POA: Diagnosis not present

## 2012-10-05 ENCOUNTER — Encounter: Payer: Self-pay | Admitting: Internal Medicine

## 2012-10-08 DIAGNOSIS — D239 Other benign neoplasm of skin, unspecified: Secondary | ICD-10-CM | POA: Diagnosis not present

## 2012-10-08 DIAGNOSIS — L723 Sebaceous cyst: Secondary | ICD-10-CM | POA: Diagnosis not present

## 2012-10-08 DIAGNOSIS — L570 Actinic keratosis: Secondary | ICD-10-CM | POA: Diagnosis not present

## 2012-11-12 ENCOUNTER — Other Ambulatory Visit: Payer: Self-pay | Admitting: Dermatology

## 2012-11-12 DIAGNOSIS — D0460 Carcinoma in situ of skin of unspecified upper limb, including shoulder: Secondary | ICD-10-CM | POA: Diagnosis not present

## 2012-11-12 DIAGNOSIS — L820 Inflamed seborrheic keratosis: Secondary | ICD-10-CM | POA: Diagnosis not present

## 2012-11-12 DIAGNOSIS — L570 Actinic keratosis: Secondary | ICD-10-CM | POA: Diagnosis not present

## 2012-11-12 DIAGNOSIS — D485 Neoplasm of uncertain behavior of skin: Secondary | ICD-10-CM | POA: Diagnosis not present

## 2013-02-22 ENCOUNTER — Emergency Department (HOSPITAL_COMMUNITY): Payer: Medicare Other

## 2013-02-22 ENCOUNTER — Inpatient Hospital Stay (HOSPITAL_COMMUNITY)
Admission: EM | Admit: 2013-02-22 | Discharge: 2013-02-25 | DRG: 064 | Disposition: A | Discharge status: Home / Self Care | Payer: Medicare Other | Attending: Internal Medicine | Admitting: Internal Medicine

## 2013-02-22 ENCOUNTER — Observation Stay (HOSPITAL_COMMUNITY): Payer: Medicare Other

## 2013-02-22 ENCOUNTER — Encounter (HOSPITAL_COMMUNITY): Payer: Self-pay | Admitting: *Deleted

## 2013-02-22 DIAGNOSIS — K224 Dyskinesia of esophagus: Secondary | ICD-10-CM

## 2013-02-22 DIAGNOSIS — I63239 Cerebral infarction due to unspecified occlusion or stenosis of unspecified carotid arteries: Secondary | ICD-10-CM | POA: Diagnosis not present

## 2013-02-22 DIAGNOSIS — R29898 Other symptoms and signs involving the musculoskeletal system: Secondary | ICD-10-CM

## 2013-02-22 DIAGNOSIS — I2510 Atherosclerotic heart disease of native coronary artery without angina pectoris: Secondary | ICD-10-CM

## 2013-02-22 DIAGNOSIS — J4489 Other specified chronic obstructive pulmonary disease: Secondary | ICD-10-CM | POA: Diagnosis present

## 2013-02-22 DIAGNOSIS — I5032 Chronic diastolic (congestive) heart failure: Secondary | ICD-10-CM | POA: Diagnosis present

## 2013-02-22 DIAGNOSIS — I428 Other cardiomyopathies: Secondary | ICD-10-CM | POA: Diagnosis present

## 2013-02-22 DIAGNOSIS — I10 Essential (primary) hypertension: Secondary | ICD-10-CM | POA: Diagnosis not present

## 2013-02-22 DIAGNOSIS — N400 Enlarged prostate without lower urinary tract symptoms: Secondary | ICD-10-CM | POA: Diagnosis present

## 2013-02-22 DIAGNOSIS — D649 Anemia, unspecified: Secondary | ICD-10-CM | POA: Diagnosis present

## 2013-02-22 DIAGNOSIS — K219 Gastro-esophageal reflux disease without esophagitis: Secondary | ICD-10-CM | POA: Diagnosis present

## 2013-02-22 DIAGNOSIS — Z01810 Encounter for preprocedural cardiovascular examination: Secondary | ICD-10-CM

## 2013-02-22 DIAGNOSIS — K1379 Other lesions of oral mucosa: Secondary | ICD-10-CM

## 2013-02-22 DIAGNOSIS — J449 Chronic obstructive pulmonary disease, unspecified: Secondary | ICD-10-CM | POA: Diagnosis present

## 2013-02-22 DIAGNOSIS — I6350 Cerebral infarction due to unspecified occlusion or stenosis of unspecified cerebral artery: Secondary | ICD-10-CM | POA: Diagnosis not present

## 2013-02-22 DIAGNOSIS — Q394 Esophageal web: Secondary | ICD-10-CM

## 2013-02-22 DIAGNOSIS — Z66 Do not resuscitate: Secondary | ICD-10-CM | POA: Diagnosis present

## 2013-02-22 DIAGNOSIS — R531 Weakness: Secondary | ICD-10-CM

## 2013-02-22 DIAGNOSIS — E785 Hyperlipidemia, unspecified: Secondary | ICD-10-CM | POA: Diagnosis present

## 2013-02-22 DIAGNOSIS — Z96649 Presence of unspecified artificial hip joint: Secondary | ICD-10-CM | POA: Diagnosis not present

## 2013-02-22 DIAGNOSIS — N179 Acute kidney failure, unspecified: Secondary | ICD-10-CM

## 2013-02-22 DIAGNOSIS — E039 Hypothyroidism, unspecified: Secondary | ICD-10-CM | POA: Diagnosis present

## 2013-02-22 DIAGNOSIS — I658 Occlusion and stenosis of other precerebral arteries: Secondary | ICD-10-CM | POA: Diagnosis not present

## 2013-02-22 DIAGNOSIS — E876 Hypokalemia: Secondary | ICD-10-CM

## 2013-02-22 DIAGNOSIS — E43 Unspecified severe protein-calorie malnutrition: Secondary | ICD-10-CM | POA: Diagnosis present

## 2013-02-22 DIAGNOSIS — I359 Nonrheumatic aortic valve disorder, unspecified: Secondary | ICD-10-CM | POA: Diagnosis not present

## 2013-02-22 DIAGNOSIS — Z96659 Presence of unspecified artificial knee joint: Secondary | ICD-10-CM

## 2013-02-22 DIAGNOSIS — G459 Transient cerebral ischemic attack, unspecified: Secondary | ICD-10-CM

## 2013-02-22 DIAGNOSIS — I639 Cerebral infarction, unspecified: Secondary | ICD-10-CM

## 2013-02-22 DIAGNOSIS — R29818 Other symptoms and signs involving the nervous system: Secondary | ICD-10-CM | POA: Diagnosis not present

## 2013-02-22 DIAGNOSIS — J189 Pneumonia, unspecified organism: Secondary | ICD-10-CM

## 2013-02-22 DIAGNOSIS — I6529 Occlusion and stenosis of unspecified carotid artery: Secondary | ICD-10-CM | POA: Diagnosis not present

## 2013-02-22 HISTORY — DX: Calculus of kidney: N20.0

## 2013-02-22 HISTORY — DX: Benign prostatic hyperplasia without lower urinary tract symptoms: N40.0

## 2013-02-22 LAB — CBC WITH DIFFERENTIAL/PLATELET
Basophils Absolute: 0 10*3/uL (ref 0.0–0.1)
Eosinophils Relative: 3 % (ref 0–5)
HCT: 38.2 % — ABNORMAL LOW (ref 39.0–52.0)
Lymphocytes Relative: 31 % (ref 12–46)
MCH: 30.1 pg (ref 26.0–34.0)
MCHC: 33.8 g/dL (ref 30.0–36.0)
MCV: 89.3 fL (ref 78.0–100.0)
Monocytes Absolute: 0.5 10*3/uL (ref 0.1–1.0)
RDW: 14.1 % (ref 11.5–15.5)
WBC: 6 10*3/uL (ref 4.0–10.5)

## 2013-02-22 LAB — BASIC METABOLIC PANEL
CO2: 31 mEq/L (ref 19–32)
Calcium: 9.9 mg/dL (ref 8.4–10.5)
Creatinine, Ser: 1.09 mg/dL (ref 0.50–1.35)
Glucose, Bld: 92 mg/dL (ref 70–99)

## 2013-02-22 MED ORDER — ACETAMINOPHEN 650 MG RE SUPP: 650.0000 mg | RECTAL | Status: DC | PRN

## 2013-02-22 MED ORDER — SENNOSIDES-DOCUSATE SODIUM 8.6-50 MG PO TABS: 2.0000 | ORAL_TABLET | Freq: Every evening | ORAL | Status: DC | PRN

## 2013-02-22 MED ORDER — OMEGA-3-ACID ETHYL ESTERS 1 G PO CAPS
1.0000 g | ORAL_CAPSULE | Freq: Every day | ORAL | Status: DC
  Administered 2013-02-24 – 2013-02-25 (×2): 1 g via ORAL
  Filled 2013-02-22 (×3): qty 1

## 2013-02-22 MED ORDER — ACETAMINOPHEN 325 MG PO TABS
650.0000 mg | ORAL_TABLET | ORAL | Status: DC | PRN
  Administered 2013-02-22: 650 mg via ORAL
  Filled 2013-02-22: qty 2

## 2013-02-22 MED ORDER — LEVOTHYROXINE SODIUM 50 MCG PO TABS
50.0000 ug | ORAL_TABLET | Freq: Every day | ORAL | Status: DC
  Administered 2013-02-24 – 2013-02-25 (×2): 50 ug via ORAL
  Filled 2013-02-22 (×5): qty 1

## 2013-02-22 MED ORDER — ENOXAPARIN SODIUM 40 MG/0.4ML ~~LOC~~ SOLN
40.0000 mg | SUBCUTANEOUS | Status: DC
  Administered 2013-02-22 – 2013-02-24 (×2): 40 mg via SUBCUTANEOUS
  Filled 2013-02-22 (×4): qty 0.4

## 2013-02-22 MED ORDER — ENSURE COMPLETE PO LIQD
237.0000 mL | Freq: Two times a day (BID) | ORAL | Status: DC
  Administered 2013-02-25: 237 mL via ORAL

## 2013-02-22 MED ORDER — ASPIRIN 325 MG PO TABS
325.0000 mg | ORAL_TABLET | Freq: Every day | ORAL | Status: DC
  Administered 2013-02-22 – 2013-02-25 (×3): 325 mg via ORAL
  Filled 2013-02-22 (×4): qty 1

## 2013-02-22 MED ORDER — ASPIRIN 300 MG RE SUPP
300.0000 mg | Freq: Every day | RECTAL | Status: DC
  Filled 2013-02-22 (×4): qty 1

## 2013-02-22 NOTE — ED Notes (Signed)
Awaiting pt return from radiology for transport to floor.

## 2013-02-22 NOTE — H&P (Addendum)
[facility] History and Physical  Jorge Nixon ZOX:[id number] DOB: 12/06/1924 DOA: 02/22/2013  Referring physician:  Donnetta Hutching PCP:  Catalina Pizza, MD   Chief Complaint:  Right arm weakness  HPI:  The patient is a 79 y.o. year-old male with history of hypertension, a mild nonischemic cardiomyopathy with grade 1 DD ECHO 2013, kidney stones, arthritis, acid reflux, COPD, kidney stones, BPH who presents with right arm weakness.  The patient was last at their baseline health until several weeks ago.  Approximately 3 weeks ago the patient was mowing his yard and when he finished and got off the mower, his right arm suddenly became weak and numb from the elbow down. He difficulty lifting his arm from the elbow. Sensation last approximately 30 minutes and resolved on its own.  He denied any associated facial droop, slurred speech, other numbness or weakness.  He was well the next several weeks. Today he around 12:30pm after lunch he developed acute onset of right upper extremity weakness and numbness from the shoulder down.  His wife states that his arm is his hung from his side and he was unable to move it without using his other arm. He denied slurred speech, facial droop, any other numbness or weakness.  At baseline he does have some mild numbness of bilateral hands.  He came to the emergency department today for further evaluation.  He denies any recent fevers, chills, cough, dysuria, focal signs of infection.  He denies any history of seizures.  He had had a distant injury to his right arm when he fell on it earlier this year, that at that time he just had a mild abrasion and no other injury. He also had a right ulnar nerve injury which resulted in him undergoing a surgery for nerve release, at which did not help much with the numbness of the lateral two fingers.  In the emergency department, his labs were notable for an mild anemia with a hemoglobin of 12.9, otherwise they were unremarkable. His head CT  demonstrated no acute intracranial laterality, moderate atrophy and white matter disease with atherosclerosis. Urinalysis is pending.  EKG demonstrated normal sinus rhythm, with suggestion of Q waves in V1 and V2 and indicate a previous septal infarct.  MRI/MRA obtained in ER demonstrated acute and subacute small infarcts in the left posterior MCA territory mostly in the white matter and possibly hemodynamically significant stenosis of the left supraclinoid ICA segment resulting in decreased flow at the left ICA terminus, left MCA, and left ACA origins.  There was no major MCA branch occlusion nad left ACA flow was symmetric to the right.    Review of Systems:  General:  Denies fevers, chills, weight loss or gain HEENT:  Denies changes to hearing and vision, sore throat.  Endorse it has chronic sinus congestion with postnasal drip. CV:  Denies chest pain and palpitations, lower extremity edema.  PULM:  Denies SOB, wheezing, cough.   GI:  Denies nausea, vomiting, constipation, diarrhea.   GU:  Denies dysuria, frequency, urgency ENDO:  Denies polyuria, polydipsia.   HEME:  Denies hematemesis, blood in stools, melena, abnormal bruising or bleeding.  LYMPH:  Denies lymphadenopathy.   MSK:  Chronic mild right hip soreness.   DERM:  Denies skin rash or ulcer.   NEURO:  Per history of present illness  PSYCH:  Denies anxiety and depression.    Past Medical History  Diagnosis Date  . Hypertension   . Diverticular disease 02/14/2011    problems  swallowing  . GERD (gastroesophageal reflux disease)   . Coronary artery disease   . Kidney stone   . Hypothyroidism   . Pneumonia   . Anemia   . COPD (chronic obstructive pulmonary disease)   . Non-ischemic cardiomyopathy     mild  EF on echo 05/03/2012 55 to 60%  . Nephrolithiasis     lithotripsy 2006  . BPH (benign prostatic hyperplasia)    Past Surgical History  Procedure Laterality Date  . Total hip arthroplasty Right august 6th 2012    right  side   . Replacement total knee Left 2006    left knee  . Transurethral resection of prostate  1996  . Knee arthroscopy Right 2010   Social History:  reports that he has never smoked. He does not have any smokeless tobacco history on file. He reports that he does not drink alcohol or use illicit drugs. The patient lives with his wife and is very functional at baseline  Allergies  Allergen Reactions  . Sulfa Antibiotics Rash    Family History  Problem Relation Age of Onset  . Stroke Father   . Hypertension    . Diabetes       Prior to Admission medications   Medication Sig Start Date End Date Taking? Authorizing Provider  acetaminophen (TYLENOL) 500 MG tablet Take 500 mg by mouth every 6 (six) hours as needed.     Yes Historical Provider, MD  aspirin EC 81 MG tablet Take 81 mg by mouth daily.   Yes Historical Provider, MD  fish oil-omega-3 fatty acids 1000 MG capsule Take 1 g by mouth daily. Takes one capsule daily   Yes Historical Provider, MD  levothyroxine (SYNTHROID, LEVOTHROID) 50 MCG tablet Take 50 mcg by mouth daily before breakfast.   Yes Historical Provider, MD  losartan-hydrochlorothiazide (HYZAAR) 50-12.5 MG per tablet Take 0.5 tablets by mouth daily. Takes 1/2 tablet daily   Yes Historical Provider, MD   Physical Exam: Filed Vitals:   02/22/13 1322 02/22/13 1415 02/22/13 1430 02/22/13 1515  BP: 178/89 172/80 152/80 160/85  Pulse: 73     Temp: 97.5 F (36.4 C)   97.1 F (36.2 C)  TempSrc: Oral   Oral  Resp: 18 20 18 19   Height: 5\' 11"  (1.803 m)     Weight: 65.772 kg (145 lb)     SpO2: 99% 98% 93% 94%     General:  Cachectic Caucasian male, no acute distress  Eyes:  PERRL, anicteric, non-injected.  ENT:  Nares clear.  OP clear, non-erythematous without plaques or exudates.  MMM.  Neck:  Supple without TM or JVD.    Lymph:  No cervical, supraclavicular, or submandibular LAD.  Cardiovascular:  RRR, normal S1, S2, without m/r/g.  2+ pulses, warm  extremities  Respiratory:  CTA bilaterally without increased WOB.  Abdomen:  NABS.  Soft, ND/NT.    Skin:  No rashes or focal lesions.  Musculoskeletal:  Normal bulk and tone.  No LE edema.  Psychiatric:  A & O x 4.  Appropriate affect.  Neurologic:  CN 3-12 intact.  5 minus out of 5 grip strength on the right, 5 minus out of 5 triceps extension, otherwise rest of exam is 5/5 strength.  Sensation intact except over the bilateral digits of the hands.  Labs on Admission:  Basic Metabolic Panel:  Recent Labs Lab 02/22/13 1339  NA 140  K 3.6  CL 99  CO2 31  GLUCOSE 92  BUN 13  CREATININE  1.09  CALCIUM 9.9   Liver Function Tests: No results found for this basename: AST, ALT, ALKPHOS, BILITOT, PROT, ALBUMIN,  in the last 168 hours No results found for this basename: LIPASE, AMYLASE,  in the last 168 hours No results found for this basename: AMMONIA,  in the last 168 hours CBC:  Recent Labs Lab 02/22/13 1339  WBC 6.0  NEUTROABS 3.5  HGB 12.9*  HCT 38.2*  MCV 89.3  PLT 156   Cardiac Enzymes: No results found for this basename: CKTOTAL, CKMB, CKMBINDEX, TROPONINI,  in the last 168 hours  BNP (last 3 results) No results found for this basename: PROBNP,  in the last 8760 hours CBG: No results found for this basename: GLUCAP,  in the last 168 hours  Radiological Exams on Admission: Ct Head Wo Contrast  02/22/2013   CLINICAL DATA:  Right arm weakness. Numbness.  EXAM: CT HEAD WITHOUT CONTRAST  TECHNIQUE: Contiguous axial images were obtained from the base of the skull through the vertex without intravenous contrast.  COMPARISON:  [facility] the reported CT head [facility] contrast [facility]. Images are no longer available.  FINDINGS: Mild generalized atrophy and white matter disease is present bilaterally. No acute cortical infarct, hemorrhage, or mass lesion is present. The ventricles are proportionate to the degree of atrophy. No significant extra-axial  fluid collection is present.  The paranasal sinuses and mastoid air cells are clear. The osseous skull is intact. Atherosclerotic calcifications are present within the cavernous carotid arteries and at the dural margin of the vertebral arteries.  IMPRESSION: 1. No acute intracranial abnormality. 2. Moderate atrophy and white matter disease likely reflects the sequela of chronic microvascular ischemia. 3. Atherosclerosis.   Electronically Signed   By: Gennette Pac   On: 02/22/2013 14:23    EKG: Independently reviewed. NSR with suggestion of previous septal infarct.   Assessment/Plan Principal Problem:   TIA (transient ischemic attack) Active Problems:   Anemia   Right arm weakness   Hypertension   CAD (coronary artery disease)  ---  Acute and subacute left MCA strokes that may be related to left ICA stenosis.  The radiologist who reviewed the films felt this may be a culprit lesion that could be amenable to intervention.  Spoke with Dr. Thad Ranger, neurology, at [facility] who suggested patient be transferred for Neurology evaluation and possible intervention.  Spoke with Ricke Hey at 5:20PM who accepted patient in transfer to hospitalist service, telemetry.  [facility] notified.   -  Admit to telemetry -  Carotid duplex vs. CT angio head and neck -  ECHO -  Aspirin 325mg  daily -  Lipid panel -  Hemoglobin A1c -  PT/OT/SLP  -  Please consult neurology upon arrival  HTN, blood pressure stable.  Hold BP medications to allow permissive hypertension.  Grade 1 diastolic heart failure, chronic and stable.  Euvolemic.    BPH.  Consider addition of flomax if needed  Hypothyroidism, stable.  Check TSH and continue synthroid  Normocytic anemia -  Check iron levels, TSH, B12, folate  Severe protein calorie malnutrition -  Healthy heart (consider increasing to reg) -  Supplements  Diet:  Healthy heart Access:  PIV IVF:  OFF Proph:  lovenox  Code Status: DNR Family Communication:  patient and wife Disposition Plan: Admit to telemetry  Time spent: 60 min Renae Fickle [facility] Pager [phone]  If 7PM-7AM, please contact night-coverage [email or web address] Password [facility] 02/22/2013, 4:00 PM

## 2013-02-22 NOTE — ED Notes (Signed)
Denies numbness in RUE presently.  States, "it just feels a little sore".

## 2013-02-22 NOTE — ED Notes (Signed)
Pt states right arm weakness and numbness, stating, "It just feels like a piece of meat hanging there" Pt states symptom began 15 min PTA. Denies  Any other symptoms or pain. This occurred two weeks ago also, lasting an hour but he was not seen then.

## 2013-02-22 NOTE — Progress Notes (Signed)
Patient transferred from [facility] after seen in ED with right upper extremity weakness and numbness from the shoulder down. MRI/MRA obtained in ER demonstrated acute and subacute small infarcts in the left posterior MCA territory mostly in the white matter and possibly hemodynamically significant stenosis of the left supraclinoid ICA segment resulting in decreased flow at the left ICA terminus, left MCA, and left ACA origins. There was no major MCA branch occlusion and left ACA flow was symmetric to the right. Neurology, Dr. Thad Ranger, was consulted Dr. Thad Ranger, and suggested patient be transferred for Neurology evaluation and possible intervention.  [name] is alert and oriented with clear speech and no evidence of upper or lower extremity weakness. He does endorse a mild headache (2/10 on pain scale). He otherwise denies fevers, chills, cough, chest pain, GI symptoms, dysuria or any focal signs of infection. See H&P from today by Dr. Owens Loffler.   Filed Vitals:   02/22/13 2148  BP: 153/66  Pulse: 59  Temp: 98.7 F (37.1 C)  Resp: 18    Physical Examination: General appearance - alert, in no distress Mental status - alert, oriented to person, place, and time Eyes - pupils equal and reactive, extraocular eye movements intact Chest - clear bilaterally Heart - normal rate and regular rhythm Extremities - no pedal edema noted   Assessment/Plan: Acute and subacute left MCA strokes that may be related to left ICA stenosis: Arrived to [address] [place]. Awaiting Neurology evaluation.

## 2013-02-22 NOTE — ED Provider Notes (Signed)
CSN: [id number]     Arrival date & time 02/22/13  1312 History   First MD Initiated Contact with Patient 02/22/13 1337     Chief Complaint  Patient presents with  . Extremity Weakness  . Numbness   (Consider location/radiation/quality/duration/timing/severity/associated sxs/prior Treatment) HPI .[name]... right arm weakness/numbness approximately one hour prior to admission. Symptoms are improving. Similar episode approximately 2-3 weeks ago which spontaneously resolved. No previous history of stroke.  patient lives independently at home.  No chest pain, fever, other neurological deficits. Severity is mild to moderate.   Past Medical History  Diagnosis Date  . Hypertension   . Diverticular disease 02/14/2011    problems swallowing  . GERD (gastroesophageal reflux disease)   . Coronary artery disease   . Kidney stone   . Hypothyroidism   . Pneumonia   . Anemia   . COPD (chronic obstructive pulmonary disease)   . Non-ischemic cardiomyopathy     mild  EF on echo 05/03/2012 55 to 60%   Past Surgical History  Procedure Laterality Date  . Total hip arthroplasty  august 6th 2012    right side   . Replacement total knee  2006    left knee  . Transurethral resection of prostate  1994 or 1995   No family history on file. History  Substance Use Topics  . Smoking status: Never Smoker   . Smokeless tobacco: Not on file  . Alcohol Use: No    Review of Systems  All other systems reviewed and are negative.    Allergies  Sulfa antibiotics  Home Medications   Current Outpatient Rx  Name  Route  Sig  Dispense  Refill  . acetaminophen (TYLENOL) 500 MG tablet   Oral   Take 500 mg by mouth every 6 (six) hours as needed.           [name] aspirin EC 81 MG tablet   Oral   Take 81 mg by mouth daily.         . fish oil-omega-3 fatty acids 1000 MG capsule   Oral   Take 1 g by mouth daily. Takes one capsule daily         . levothyroxine (SYNTHROID, LEVOTHROID) 50 MCG tablet   Oral  Take 50 mcg by mouth daily before breakfast.         . losartan-hydrochlorothiazide (HYZAAR) 50-12.5 MG per tablet   Oral   Take 0.5 tablets by mouth daily. Takes 1/2 tablet daily          BP 152/80  Pulse 73  Temp(Src) 97.5 F (36.4 C) (Oral)  Resp 18  Ht 5\' 11"  (1.803 m)  Wt 145 lb (65.772 kg)  BMI 20.23 kg/m2  SpO2 93% Physical Exam  Nursing note and vitals reviewed. Constitutional: He is oriented to person, place, and time. He appears well-developed and well-nourished.  HENT:  Head: Normocephalic and atraumatic.  Eyes: Conjunctivae and EOM are normal. Pupils are equal, round, and reactive to light.  Neck: Normal range of motion. Neck supple.  Cardiovascular: Normal rate, regular rhythm and normal heart sounds.   Pulmonary/Chest: Effort normal and breath sounds normal.  Abdominal: Soft. Bowel sounds are normal.  Musculoskeletal: Normal range of motion.  Neurological: He is alert and oriented to person, place, and time.  Patient to move right arm, but it feels numb  Skin: Skin is warm and dry.  Psychiatric: He has a normal mood and affect.    ED Course  Procedures (including critical care  time) Labs Review Labs Reviewed  CBC WITH DIFFERENTIAL - Abnormal; Notable for the following:    Hemoglobin 12.9 (*)    HCT 38.2 (*)    All other components within normal limits  BASIC METABOLIC PANEL - Abnormal; Notable for the following:    GFR calc non Af Amer 59 (*)    GFR calc Af Amer 69 (*)    All other components within normal limits   Imaging Review Ct Head Wo Contrast  02/22/2013   CLINICAL DATA:  Right arm weakness. Numbness.  EXAM: CT HEAD WITHOUT CONTRAST  TECHNIQUE: Contiguous axial images were obtained from the base of the skull through the vertex without intravenous contrast.  COMPARISON:  [facility] the reported CT head without contrast [facility]/09/2001 [facility]. Images are no longer available.  FINDINGS: Mild generalized atrophy and white matter disease is  present bilaterally. No acute cortical infarct, hemorrhage, or mass lesion is present. The ventricles are proportionate to the degree of atrophy. No significant extra-axial fluid collection is present.  The paranasal sinuses and mastoid air cells are clear. The osseous skull is intact. Atherosclerotic calcifications are present within the cavernous carotid arteries and at the dural margin of the vertebral arteries.  IMPRESSION: 1. No acute intracranial abnormality. 2. Moderate atrophy and white matter disease likely reflects the sequela of chronic microvascular ischemia. 3. Atherosclerosis.   Electronically Signed   By: Gennette Pac   On: 02/22/2013 14:23    Date: 02/22/2013  Rate: 69  Rhythm: normal sinus rhythm  QRS Axis: normal  Intervals: normal  ST/T Wave abnormalities: normal  Conduction Disutrbances: none  Narrative Interpretation: unremarkable c SA    MDM  No diagnosis found. History and physical suggest a TIA. Similar symptoms 2-3 weeks ago. Will admit to observation.    Donnetta Hutching, MD 02/22/13 1515

## 2013-02-23 DIAGNOSIS — I10 Essential (primary) hypertension: Secondary | ICD-10-CM | POA: Diagnosis not present

## 2013-02-23 DIAGNOSIS — R29898 Other symptoms and signs involving the musculoskeletal system: Secondary | ICD-10-CM | POA: Diagnosis not present

## 2013-02-23 DIAGNOSIS — I63239 Cerebral infarction due to unspecified occlusion or stenosis of unspecified carotid arteries: Secondary | ICD-10-CM | POA: Diagnosis not present

## 2013-02-23 DIAGNOSIS — E43 Unspecified severe protein-calorie malnutrition: Secondary | ICD-10-CM | POA: Diagnosis not present

## 2013-02-23 DIAGNOSIS — D649 Anemia, unspecified: Secondary | ICD-10-CM | POA: Diagnosis not present

## 2013-02-23 DIAGNOSIS — I639 Cerebral infarction, unspecified: Secondary | ICD-10-CM

## 2013-02-23 DIAGNOSIS — I359 Nonrheumatic aortic valve disorder, unspecified: Secondary | ICD-10-CM

## 2013-02-23 MED ORDER — SODIUM CHLORIDE 0.45 % IV SOLN: INTRAVENOUS | Status: DC

## 2013-02-23 MED ORDER — SODIUM CHLORIDE 0.9 % IV SOLN
INTRAVENOUS | Status: DC
  Administered 2013-02-23 – 2013-02-24 (×2): via INTRAVENOUS

## 2013-02-23 MED ORDER — CLOPIDOGREL BISULFATE 75 MG PO TABS
75.0000 mg | ORAL_TABLET | Freq: Every day | ORAL | Status: DC
  Administered 2013-02-23 – 2013-02-25 (×3): 75 mg via ORAL
  Filled 2013-02-23 (×4): qty 1

## 2013-02-23 MED ORDER — ONDANSETRON HCL 4 MG/2ML IJ SOLN
4.0000 mg | Freq: Four times a day (QID) | INTRAMUSCULAR | Status: DC
  Filled 2013-02-23: qty 2

## 2013-02-23 NOTE — Progress Notes (Addendum)
While pt was getting ready to eat dinner he felt nauseous with abdominal pain. Abdomen is soft and have bowel sounds in all four quadrants. Treated with Zofran IV. [place]

## 2013-02-23 NOTE — Progress Notes (Signed)
UR Completed.  Jorge Nixon [phone] 02/23/2013  

## 2013-02-23 NOTE — Progress Notes (Signed)
[facility] PROGRESS NOTE  Jorge Nixon WJX:[id number] DOB: Feb 02, 1925 DOA: 02/22/2013 PCP: Catalina Pizza, MD  Assessment/Plan: Principal Problem:   TIA (transient ischemic attack) Active Problems:   Anemia   Protein-calorie malnutrition, severe   Right arm weakness   Hypertension   CAD (coronary artery disease)     1. Recurrent CVA: Patient presented with recurrent RUE weakness, dating from 3 weeks ago. Brain MRI revealed acute and subacute small infarcts in the left posterior MCA territory mostly in the white matter. MRA showed possibly hemodynamically significant stenosis of the left supraclinoid ICA segment resulting in decreased flow at the left ICA terminus, left MCA, and left ACA origins. Carotid duplex showed plaque formation in the carotid systems, while velocities within the left ICA correspond to a 50-69% diameter stenosis and in the right ICA, correspond to a less than 50% diameter stenosis. 2D echocardiogram is pending, as is lipid panel. Currently on Aspirin 325mg  daily. Dr Thana Farr, neurologist has been consulted, and recommendations, awaited. Meanwhile, PT/OT. RUE weakness has improved overnight.  2. HTN: BP is reasonable, in the context of acute CVA. Allowing permissive hypertension.  3. History of Grade 1 diastolic heart failure: Chronic/stable. No clinical decompensation.  4. BPH: Patient is s/p TURP. Not problematic.  5. Hypothyroidism:  Continued on thyroxine replacement therapy. TSH is pending.  6. Anemia: this is mild, normocytic anemia. Iron levels, TSH, B12, folate are pending.  7. Severe protein calorie malnutrition: Patient appears clinically malnourished. For nutritionist consult.        Code Status: DNR Family Communication:  Disposition Plan: To be determined.    Brief narrative: The patient is a 79 y.o. year-old male with history of hypertension, a mild nonischemic cardiomyopathy with grade 1 Diastolic Dysfunction, ECHO 55%-60% 2013, kidney  stones, BPH s/p TURP, arthritis s/p Right THA, s/p Left TKA, GERD, COPD, hypothyroidism, who presents with right arm weakness. The patient was last at baseline health until several weeks ago. Approximately 3 weeks ago, the patient was mowing his yard and when he finished and got off the mower, his right arm suddenly became weak and numb from the elbow down. He difficulty lifting his arm from the elbow. Sensation last approximately 30 minutes and resolved on its own. He denied any associated facial droop, slurred speech, other numbness or weakness. He was well the next several weeks. On 02/22/13, around 12:30pm after lunch, he developed acute onset of right upper extremity weakness and numbness from the shoulder down. His wife states that his arm hung from his side and he was unable to move it without using his other arm. He denied slurred speech, facial droop, any other numbness or weakness. At baseline he does have some mild numbness of bilateral hands. He then came to the ER at [facility] for further evaluation. He denies any recent fevers, chills, cough, dysuria, focal signs of infection. He denies any history of seizures. He had a distant injury to his right arm when he fell on it earlier this year, that at that time he just had a mild abrasion and no other injury. He also had a right ulnar nerve injury which resulted in him undergoing a surgery for nerve release, at which did not help much with the numbness of the lateral two fingers. In the ER, his labs were notable for an mild anemia with a hemoglobin of 12.9, otherwise unremarkable. Head CT scan demonstrated no acute intracranial abnormality, moderate atrophy and white matter disease with atherosclerosis. EKG demonstrated normal sinus rhythm,  with suggestion of Q waves in V1 and V2 and indicate a previous septal infarct.  MRI/MRA demonstrated acute and subacute small infarcts in the left posterior MCA territory mostly in the white matter and possibly  hemodynamically significant stenosis of the left supraclinoid ICA segment resulting in decreased flow at the left ICA terminus, left MCA, and left ACA origins. There was no major MCA branch occlusion and left ACA flow was symmetric to the right. Patient was transferred to [facility], after consultation with neurologist, for further evaluation and management.   Consultants:  Dr Thana Farr, Neurology.  Procedures:  Head CT Scan.  Brain MRI/MRA.   Antibiotics:  N/A.   HPI/Subjective: No new issues.   Objective: Vital signs in last 24 hours: Temp:  [97.1 F (36.2 C)-98.7 F (37.1 C)] 97.8 F (36.6 C) (09/27 0611) Pulse Rate:  [54-73] 58 (09/27 0611) Resp:  [18-20] 18 (09/27 0611) BP: (138-178)/(66-92) 138/74 mmHg (09/27 0611) SpO2:  [93 %-99 %] 97 % (09/27 0611) Weight:  [65.772 kg (145 lb)] 65.772 kg (145 lb) (09/26 1753) Weight change:  Last BM Date: 02/22/13  Intake/Output from previous day:       Physical Exam: General: Comfortable, alert, communicative, fully oriented, not short of breath at rest. Appears underweight.  HEENT:  Mild clinical pallor, no jaundice, no conjunctival injection or discharge. NECK:  Supple, JVP not seen, no carotid bruits, no palpable lymphadenopathy, no palpable goiter. CHEST:  Clinically clear to auscultation, no wheezes, no crackles. HEART:  Sounds 1 and 2 heard, normal, regular, no murmurs. ABDOMEN:  Flat, soft, non-tender, no palpable organomegaly, no palpable masses, normal bowel sounds. GENITALIA:  Not examined. LOWER EXTREMITIES:  No pitting edema, palpable peripheral pulses. MUSCULOSKELETAL SYSTEM:  Generalized osteoarthritic changes, otherwise, normal. CENTRAL NERVOUS SYSTEM:  Mild RUE weakness. No other focal neurologic deficit on gross examination.  Lab Results:  Recent Labs  02/22/13 1339  WBC 6.0  HGB 12.9*  HCT 38.2*  PLT 156    Recent Labs  02/22/13 1339  NA 140  K 3.6  CL 99  CO2 31  GLUCOSE 92  BUN 13   CREATININE 1.09  CALCIUM 9.9   No results found for this or any previous visit (from the past 240 hour(s)).   Studies/Results: Dg Chest 2 View  02/22/2013   CLINICAL DATA:  Extremity weakness. Current history of COPD, cardiomyopathy, hypertension, and GE reflux disease.  EXAM: CHEST  2 VIEW  COMPARISON:  CT chest 08/23/2011. Two-view chest x-ray 08/19/2011, 04/18/2011.  FINDINGS: Cardiac silhouette upper normal in size, unchanged. Thoracic aorta tortuous and atherosclerotic, unchanged. Hilar and mediastinal contours otherwise unremarkable. Scarring and bronchiectasis in the right middle lobe and to a lesser degree the lingula, as noted on the prior CT, unchanged. Stable hyperinflation and emphysematous changes throughout both lungs. Stable mild biapical pleuroparenchymal scarring. Lungs otherwise clear. No pleural effusions. Degenerative changes involving the thoracic spine.  IMPRESSION: No acute cardiopulmonary disease. Stable COPD/emphysema and scar/bronchiectasis in the right middle lobe and to a lesser degree the lingula. Stable biapical pleuroparenchymal scarring.   Electronically Signed   By: Hulan Saas   On: 02/22/2013 19:32   Ct Head Wo Contrast  02/22/2013   CLINICAL DATA:  Right arm weakness. Numbness.  EXAM: CT HEAD WITHOUT CONTRAST  TECHNIQUE: Contiguous axial images were obtained from the base of the skull through the vertex without intravenous contrast.  COMPARISON:  And branch the reported CT head without contrast [facility]/09/[facility]. Images are no longer available.  FINDINGS: Mild generalized atrophy and white matter disease is present bilaterally. No acute cortical infarct, hemorrhage, or mass lesion is present. The ventricles are proportionate to the degree of atrophy. No significant extra-axial fluid collection is present.  The paranasal sinuses and mastoid air cells are clear. The osseous skull is intact. Atherosclerotic calcifications are present within the cavernous  carotid arteries and at the dural margin of the vertebral arteries.  IMPRESSION: 1. No acute intracranial abnormality. 2. Moderate atrophy and white matter disease likely reflects the sequela of chronic microvascular ischemia. 3. Atherosclerosis.   Electronically Signed   By: Gennette Pac   On: 02/22/2013 14:23   Mr Maxine Glenn Head Wo Contrast  02/22/2013   CLINICAL DATA:  79 year old male with weakness and headache.  EXAM: MRI HEAD WITHOUT CONTRAST  MRA HEAD WITHOUT CONTRAST  TECHNIQUE: Multiplanar, multiecho pulse sequences of the brain and surrounding structures were obtained without intravenous contrast. Angiographic images of the head were obtained using MRA technique without contrast.  COMPARISON:  Head CT 02/22/2013.  FINDINGS: Study is intermittently degraded by motion artifact despite repeated imaging attempts.  MRI HEAD FINDINGS  There is increased trace diffusion signal in the left posterior frontal lobe and parietal lobe (series 5, image 41) correspond to isointense to restricted diffusion (series 6, image 18). Associated T2 and FLAIR hyperintensity, plus superimposed fairly confluent bilateral corona radiata T2 and FLAIR increased signal. No associated mass effect or hemorrhage. Probably punctate sensory cortex involvement on series 5, image 44.  No other diffusion abnormality identified. Major intracranial vascular flow voids are preserved.  Cerebral volume is within normal limits for age. No ventriculomegaly. No midline shift, mass effect, or evidence of intracranial mass lesion. No acute intracranial hemorrhage identified. No cortical encephalomalacia identified. Deep gray matter nuclei, brainstem and cerebellum within normal limits for age.  Negative pituitary, cervicomedullary junction visualized cervical spine. Normal bone marrow signal. Visualized orbit soft tissues are within normal limits. Visualized paranasal sinuses and mastoids are clear. Negative scalp soft tissues.  MRA HEAD FINDINGS   Antegrade flow in the posterior circulation with fairly codominant distal vertebral arteries. Normal right PICA. Patent vertebrobasilar junction. Mild basilar tortuosity and irregularity without stenosis. SCA and PCA origins within normal limits. Posterior communicating artery is diminutive or absent.  Antegrade flow in both ICA siphons with irregularity and asymmetric decreased signal of the distal left ICA siphon. Appearance raises the possibility of a hemodynamically significant stenosis in the supra clinoid segment (series 109, image 28). Subsequent lead asymmetric decreased flow signal at the left ICA terminus, left MCA, and left ACA origins.  Negative right ICA siphon, right MCA and ACA origins. Diminutive anterior communicating artery. Visualized ACA flow signal beyond the A1 segment on the left is within normal limits. Visualized right MCA branches are within normal limits.  Decreased flow signal in the left MCA M1 segment still, the left MCA bifurcation is patent. No major left MCA branch occlusion or high-grade stenosis identified.  IMPRESSION: MRI HEAD IMPRESSION  1. Acute to subacute small infarcts in the left posterior MCA territory mostly affecting white matter. No mass effect or hemorrhage.  2. Underlying bilateral nonspecific cerebral white matter signal changes, favor chronic small vessel disease.  MRA HEAD IMPRESSION  1. Constellation of findings suggestive of hemodynamically significant stenosis in the left supra clinoid ICA segment, resulting in decreased flow at the left ICA terminus, left MCA, and left ACA origins.  2. No major left MCA branch occlusion. Left ACA flow is symmetric to that on  the right be on the anterior communicating artery.  Study discussed by telephone with Dr. Thea Silversmith SHORT on 02/22/2013 at 16:34 .   Electronically Signed   By: Augusto Gamble M.D.   On: 02/22/2013 16:38   Mr Brain Wo Contrast  02/22/2013   CLINICAL DATA:  79 year old male with weakness and headache.  EXAM:  MRI HEAD WITHOUT CONTRAST  MRA HEAD WITHOUT CONTRAST  TECHNIQUE: Multiplanar, multiecho pulse sequences of the brain and surrounding structures were obtained without intravenous contrast. Angiographic images of the head were obtained using MRA technique without contrast.  COMPARISON:  Head CT 02/22/2013.  FINDINGS: Study is intermittently degraded by motion artifact despite repeated imaging attempts.  MRI HEAD FINDINGS  There is increased trace diffusion signal in the left posterior frontal lobe and parietal lobe (series 5, image 41) correspond to isointense to restricted diffusion (series 6, image 18). Associated T2 and FLAIR hyperintensity, plus superimposed fairly confluent bilateral corona radiata T2 and FLAIR increased signal. No associated mass effect or hemorrhage. Probably punctate sensory cortex involvement on series 5, image 44.  No other diffusion abnormality identified. Major intracranial vascular flow voids are preserved.  Cerebral volume is within normal limits for age. No ventriculomegaly. No midline shift, mass effect, or evidence of intracranial mass lesion. No acute intracranial hemorrhage identified. No cortical encephalomalacia identified. Deep gray matter nuclei, brainstem and cerebellum within normal limits for age.  Negative pituitary, cervicomedullary junction visualized cervical spine. Normal bone marrow signal. Visualized orbit soft tissues are within normal limits. Visualized paranasal sinuses and mastoids are clear. Negative scalp soft tissues.  MRA HEAD FINDINGS  Antegrade flow in the posterior circulation with fairly codominant distal vertebral arteries. Normal right PICA. Patent vertebrobasilar junction. Mild basilar tortuosity and irregularity without stenosis. SCA and PCA origins within normal limits. Posterior communicating artery is diminutive or absent.  Antegrade flow in both ICA siphons with irregularity and asymmetric decreased signal of the distal left ICA siphon. Appearance  raises the possibility of a hemodynamically significant stenosis in the supra clinoid segment (series 109, image 28). Subsequent lead asymmetric decreased flow signal at the left ICA terminus, left MCA, and left ACA origins.  Negative right ICA siphon, right MCA and ACA origins. Diminutive anterior communicating artery. Visualized ACA flow signal beyond the A1 segment on the left is within normal limits. Visualized right MCA branches are within normal limits.  Decreased flow signal in the left MCA M1 segment still, the left MCA bifurcation is patent. No major left MCA branch occlusion or high-grade stenosis identified.  IMPRESSION: MRI HEAD IMPRESSION  1. Acute to subacute small infarcts in the left posterior MCA territory mostly affecting white matter. No mass effect or hemorrhage.  2. Underlying bilateral nonspecific cerebral white matter signal changes, favor chronic small vessel disease.  MRA HEAD IMPRESSION  1. Constellation of findings suggestive of hemodynamically significant stenosis in the left supra clinoid ICA segment, resulting in decreased flow at the left ICA terminus, left MCA, and left ACA origins.  2. No major left MCA branch occlusion. Left ACA flow is symmetric to that on the right be on the anterior communicating artery.  Study discussed by telephone with Dr. Thea Silversmith SHORT on 02/22/2013 at 16:34 .   Electronically Signed   By: Augusto Gamble M.D.   On: 02/22/2013 16:38   US Carotid Duplex Bilateral  02/22/2013   CLINICAL DATA:  Right arm weakness and numbness, TIA, history hypertension, coronary artery disease, non ischemic cardiomyopathy, COPD  EXAM: BILATERAL CAROTID DUPLEX ULTRASOUND  TECHNIQUE: Wallace Cullens scale imaging, color Doppler and duplex ultrasound were performed of bilateral carotid and vertebral arteries in the neck.  COMPARISON:  03/25/2004  FINDINGS: Criteria: Quantification of carotid stenosis is based on velocity parameters that correlate the residual internal carotid diameter with  NASCET-based stenosis levels, using the diameter of the distal internal carotid lumen as the denominator for stenosis measurement.  The following velocity measurements were obtained:  RIGHT  ICA:  92/33 Cm/sec  CCA:  73/18 cm/sec  SYSTOLIC ICA/CCA RATIO:  1.26  DIASTOLIC ICA/CCA RATIO:  1.82  ECA:  109 cm/sec  LEFT  ICA:  168/19 cm/sec  CCA:  65/8 cm/sec  SYSTOLIC ICA/CCA RATIO:  2.60  DIASTOLIC ICA/CCA RATIO:  2.35  ECA:  144 cm/sec  RIGHT CAROTID ARTERY: Intimal thickening and noncalcified plaque right CCA. More prominent plaques at right carotid bulb into proximal right ICA, both hypoechoic and calcified non shadowing plaques. Laminar flow on color Doppler imaging with mild spectral broadening in right ICA on waveform analysis. No high velocity jets.  RIGHT VERTEBRAL ARTERY:  Patent, antegrade  LEFT CAROTID ARTERY: Intimal thickening and noncalcified plaque left CCA. More pronounced noncalcified plaque at left carotid bulb with calcified and noncalcified plaque at the proximal left ICA. Associated wih turbulent blood flow in left ICA on color Doppler imaging and spectral broadening on waveform analysis. Increased velocity present.  LEFT VERTEBRAL ARTERY:  Patent, antegrade  IMPRESSION: Plaque formation in the carotid systems as above.  Velocities within the left ICA correspond to a 50-69% diameter stenosis.  Velocities within the right ICA correspond to a less than 50% diameter stenosis.   Electronically Signed   By: Ulyses Southward M.D.   On: 02/22/2013 17:21    Medications: Scheduled Meds: . aspirin  300 mg Rectal Daily   Or  . aspirin  325 mg Oral Daily  . enoxaparin (LOVENOX) injection  40 mg Subcutaneous Q24H  . feeding supplement  237 mL Oral BID BM  . levothyroxine  50 mcg Oral QAC breakfast  . omega-3 acid ethyl esters  1 g Oral Daily   Continuous Infusions:  PRN Meds:.acetaminophen, acetaminophen, senna-docusate    LOS: 1 day   Robi Dewolfe,CHRISTOPHER  [facility] Pager [phone]. If  8PM-8AM, please contact night-coverage at [email or web address], password [facility] 02/23/2013, 8:58 AM  LOS: 1 day

## 2013-02-23 NOTE — Consult Note (Signed)
Referring Physician: Dr Brien Few    Chief Complaint: Transient right upper extremity weakness  HPI: Jorge Nixon is an 79 y.o. male  admitted to [facility] last night, 02/22/2013, in transfer from [facility] for further evaluation of two transient episodes of right upper extremity weakness consistent with TIAs. The history was obtained from the patient and his wife. Approximately 3 weeks ago after mowing the lawn the patient was noted to have weakness of his right upper extremity from the elbow on down. This episode lasted approximately 30 minutes. They did not seek medical attention at that time.  Yesterday, 02/22/2013, at approximately 12:30 after lunch the patient once again developed weakness of his right upper extremity. The patient's wife reported that this episode was much more severe as it involved the entire arm and the patient was unable to move his arm at all. Apparently this episode lasted approximately one hour. They came to the emergency department for further evaluation.  An MRI of the head showed acute to subacute small infarcts in the left posterior middle cerebral artery territory mostly affecting the white matter. An MRA of the head was consistent with hemodynamically significant stenosis in the left supraclinoid internal carotid artery segment with decreased flow to the left internal carotid artery terminus, left middle cerebral artery, and left anterior communicating origins. There was no major left middle cerebral artery branch occlusion.  In talking with the patient and his wife they would like to be aggressive in this situation in order to prevent a significant stroke. He and his wife stay active and usually walk at least a mile per day. The patient was on aspirin 81 mg daily PTA.    Date last known well: Date: 02/22/2013 Time last known well: Time: 12:30 tPA Given: No: TPA was not given as the patient's symptoms quickly resolved.  Past Medical History   Diagnosis Date  . Hypertension   . Diverticular disease 02/14/2011    problems swallowing  . GERD (gastroesophageal reflux disease)   . Coronary artery disease   . Kidney stone   . Hypothyroidism   . Pneumonia   . Anemia   . COPD (chronic obstructive pulmonary disease)   . Non-ischemic cardiomyopathy     mild  EF on echo 05/03/2012 55 to 60%  . Nephrolithiasis     lithotripsy 2006  . BPH (benign prostatic hyperplasia)     Past Surgical History  Procedure Laterality Date  . Total hip arthroplasty Right august 6th 2012    right side   . Replacement total knee Left 2006    left knee  . Transurethral resection of prostate  1996  . Knee arthroscopy Right 2010    Family History  Problem Relation Age of Onset  . Stroke Father   . Hypertension    . Diabetes     Social History:  reports that he has never smoked. He does not have any smokeless tobacco history on file. He reports that he does not drink alcohol or use illicit drugs.  Allergies:  Allergies  Allergen Reactions  . Sulfa Antibiotics Rash    Medications:  Scheduled: . aspirin  300 mg Rectal Daily   Or  . aspirin  325 mg Oral Daily  . enoxaparin (LOVENOX) injection  40 mg Subcutaneous Q24H  . feeding supplement  237 mL Oral BID BM  . levothyroxine  50 mcg Oral QAC breakfast  . omega-3 acid ethyl esters  1 g Oral Daily    ROS:  History obtained from the patient  General ROS: negative for - chills, fatigue, fever, night sweats, weight gain or weight loss Psychological ROS: negative for - behavioral disorder, hallucinations, memory difficulties, mood swings or suicidal ideation. The patient's wife believes he has been more irritable over the past 2 weeks which was abnormal for him. Ophthalmic ROS: negative for - blurry vision, double vision, eye pain or loss of vision ENT ROS: negative for - epistaxis, nasal discharge, oral lesions, sore throat, tinnitus or vertigo Allergy and Immunology ROS: negative for -  hives or itchy/watery eyes Hematological and Lymphatic ROS: negative for - bleeding problems, bruising or swollen lymph nodes Endocrine ROS: negative for - galactorrhea, hair pattern changes, polydipsia/polyuria or temperature intolerance Respiratory ROS: negative for - cough, hemoptysis, shortness of breath or wheezing Cardiovascular ROS: negative for - chest pain, dyspnea on exertion, edema or irregular heartbeat Gastrointestinal ROS: negative for - abdominal pain, diarrhea, hematemesis, nausea/vomiting or stool incontinence Genito-Urinary ROS: negative for - dysuria, hematuria, incontinence or urinary frequency/urgency Musculoskeletal ROS: negative for - joint swelling or muscular weakness. Positive for arthritis mainly in the hands and knees. Positive for numbness and tingling of his feet and occasionally his hands. Neurological ROS: as noted in HPI Dermatological ROS: negative for rash and skin lesion changes  Physical Examination: Blood pressure 138/74, pulse 58, temperature 97.8 F (36.6 C), temperature source Oral, resp. rate 18, height 5\' 11"  (1.803 m), weight 65.772 kg (145 lb), SpO2 97.00%.  General - pleasant 79 year old male in no acute distress Neck - bilateral carotid bruits Heart - Regular rate and rhythm - distant heart sounds Lungs - Clear to auscultation Abdomen - Soft - non tender Extremities - weak to absent - no edema Skin - Warm and dry   Neurologic Examination:  Mental Status: Alert, oriented, thought content appropriate.  Speech fluent without evidence of aphasia.  Able to follow 3 step commands without difficulty. Cranial Nerves: II: Discs not visualized; Visual fields grossly normal, pupils equal, round, reactive to light and accommodation III,IV, VI: ptosis not present, extra-ocular motions intact bilaterally V,VII: smile symmetric, facial light touch sensation normal bilaterally VIII: hearing normal bilaterally IX,X: gag reflex present XI: bilateral  shoulder shrug XII: midline tongue extension Motor: Right : Upper extremity   5/5    Left:     Upper extremity   5/5  Lower extremity   5/5     Lower extremity   5/5 Tone and bulk:normal tone throughout; no atrophy noted Sensory:  light touch intact throughout, bilaterally Deep Tendon Reflexes: 2+ and symmetric throughout Plantars: Right: downgoing   Left: downgoing Cerebellar: normal finger-to-nose except for mild tremor bilaterally Gait: Deferred   Laboratory Studies:  Basic Metabolic Panel:  Recent Labs Lab 02/22/13 1339  NA 140  K 3.6  CL 99  CO2 31  GLUCOSE 92  BUN 13  CREATININE 1.09  CALCIUM 9.9    Liver Function Tests: No results found for this basename: AST, ALT, ALKPHOS, BILITOT, PROT, ALBUMIN,  in the last 168 hours No results found for this basename: LIPASE, AMYLASE,  in the last 168 hours No results found for this basename: AMMONIA,  in the last 168 hours  CBC:  Recent Labs Lab 02/22/13 1339  WBC 6.0  NEUTROABS 3.5  HGB 12.9*  HCT 38.2*  MCV 89.3  PLT 156    Cardiac Enzymes: No results found for this basename: CKTOTAL, CKMB, CKMBINDEX, TROPONINI,  in the last 168 hours  BNP: No components found with this basename:  POCBNP,   CBG: No results found for this basename: GLUCAP,  in the last 168 hours  Microbiology: Results for orders placed during the hospital encounter of 02/10/11  URINE CULTURE     Status: None   Collection Time    02/10/11  7:46 PM      Result Value Range Status   Specimen Description URINE, CLEAN CATCH   Final   Special Requests NONE   Final   Culture  Setup Time [id number]   Final   Colony Count NO GROWTH   Final   Culture NO GROWTH   Final   Report Status 02/12/2011 FINAL   Final  CULTURE, SPUTUM-ASSESSMENT     Status: None   Collection Time    02/11/11  2:08 AM      Result Value Range Status   Specimen Description SPU EXPECTORATED   Final   Special Requests NONE   Final   Sputum evaluation     Final   Value:  MICROSCOPIC FINDINGS SUGGEST THAT THIS SPECIMEN IS NOT REPRESENTATIVE OF LOWER RESPIRATORY SECRETIONS. PLEASE RECOLLECT.     Results Called to: TRACY,RN AT 0428 ON 02/11/2011 BY MILES,A.     Performed at [facility]   Report Status 02/14/2011 FINAL   Final  CULTURE, SPUTUM-ASSESSMENT     Status: None   Collection Time    02/11/11  5:30 AM      Result Value Range Status   Specimen Description SPUTUM   Final   Special Requests Normal   Final   Sputum evaluation     Final   Value: MICROSCOPIC FINDINGS SUGGEST THAT THIS SPECIMEN IS NOT REPRESENTATIVE OF LOWER RESPIRATORY SECRETIONS. PLEASE RECOLLECT.     Results Called to: MAVIS,J. AT 0915 ON 02/11/2011 BY RESSEGGER,R.    Report Status 02/11/2011 FINAL   Final  CULTURE, SPUTUM-ASSESSMENT     Status: None   Collection Time    02/16/11  5:03 AM      Result Value Range Status   Specimen Description SPUTUM EXPECTORATED   Final   Special Requests NONE   Final   Sputum evaluation     Final   Value: THIS SPECIMEN IS ACCEPTABLE. RESPIRATORY CULTURE REPORT TO FOLLOW.     Performed at [facility]   Report Status 02/16/2011 FINAL   Final  CULTURE, RESPIRATORY     Status: None   Collection Time    02/16/11  5:03 AM      Result Value Range Status   Specimen Description SPUTUM EXPECTORATED   Final   Special Requests NONE   Final   Gram Stain     Final   Value: ABUNDANT WBC PRESENT, PREDOMINANTLY PMN     ABUNDANT SQUAMOUS EPITHELIAL CELLS PRESENT     RARE YEAST WITH PSEUDOHYPHAE   Culture MODERATE CANDIDA ALBICANS   Final   Report Status 02/18/2011 FINAL   Final  CLOSTRIDIUM DIFFICILE BY PCR     Status: None   Collection Time    02/17/11  2:39 PM      Result Value Range Status   C difficile by pcr NEGATIVE  NEGATIVE Final  CLOSTRIDIUM DIFFICILE BY PCR     Status: None   Collection Time    02/22/11 10:02 AM      Result Value Range Status   C difficile by pcr NEGATIVE  NEGATIVE Final    Coagulation Studies: No results  found for this basename: LABPROT, INR,  in the last 72 hours  Urinalysis:  No results found for this basename: COLORURINE, APPERANCEUR, LABSPEC, PHURINE, GLUCOSEU, HGBUR, BILIRUBINUR, KETONESUR, PROTEINUR, UROBILINOGEN, NITRITE, LEUKOCYTESUR,  in the last 168 hours  Lipid Panel: No results found for this basename: chol,  trig,  hdl,  cholhdl,  vldl,  ldlcalc    HgbA1C:  No results found for this basename: HGBA1C    Urine Drug Screen:   No results found for this basename: labopia,  cocainscrnur,  labbenz,  amphetmu,  thcu,  labbarb    Alcohol Level: No results found for this basename: ETH,  in the last 168 hours  Other results: EKG: Sinus rhythm rate 69 beats per minute with sinus arrhythmia and septal infarct.  Imaging:  Dg Chest 2 View 02/22/2013  No acute cardiopulmonary disease. Stable COPD/emphysema and scar/bronchiectasis in the right middle lobe and to a lesser degree the lingula. Stable biapical pleuroparenchymal scarring.    Ct Head Wo Contrast 02/22/2013   No acute intracranial abnormality. 2. Moderate atrophy and white matter disease likely reflects the sequela of chronic microvascular ischemia. 3. Atherosclerosis.   Mr Maxine Glenn Head Wo Contrast 02/22/2013  1. Acute to subacute small infarcts in the left posterior MCA territory mostly affecting white matter. No mass effect or hemorrhage.  2. Underlying bilateral nonspecific cerebral white matter signal changes, favor chronic small vessel disease.    MRA HEAD 1. Constellation of findings suggestive of hemodynamically significant stenosis in the left supra clinoid ICA segment, resulting in decreased flow at the left ICA terminus, left MCA, and left ACA origins.  2. No major left MCA branch occlusion. Left ACA flow is symmetric to that on the right be on the anterior communicating artery.    US Carotid Duplex Bilateral 02/22/2013    Plaque formation in the carotid systems as above.  Velocities within the left ICA correspond to a  50-69% diameter stenosis.  Velocities within the right ICA correspond to a less than 50% diameter stenosis.     Assessment: 79 y.o. male admitted for evaluation of 2 transient episodes of right upper extremity weakness. The first occurred 3 weeks ago and resolved after 30 minutes. He most recent episode occurred yesterday at approximately 12:30, was more severe, but resolved after 60 minutes. An MRI was consistent with acute to subacute small infarcts in the left posterior middle cerebral artery territory. An MRA was consistent with hemodynamically significant stenosis in the left supraclinoid internal carotid artery segment. The patient's exam at this time is essentially normal. The patient and his wife realize the implications and are anxious to avoid further strokes.  Discussed with Dr. Thad Ranger and Dr. Corliss Skains. Plan cerebral angiogram in a.m. for further evaluation of cerebrovascular disease. Add Plavix 75 mg daily and hydrate patient today with IV fluids. Heart healthy diet. NPO after midnight. Dr. Corliss Skains wants to be notified in the interim if there is any change in the patient's neurological status.  Stroke Risk Factors - hypertension  Plan: 1. HgbA1c, fasting lipid panel 2. MRI, MRA  of the brain without contrast - as above 3. PT consult, OT consult, Speech consult 4. Echocardiogram 5. Carotid dopplers - as above 6. Prophylactic therapy- aspirin 81 mg daily prior to admission. Now on aspirin 325 mg daily. 7. Risk factor modification 8. Telemetry monitoring 9. Frequent neuro checks   Hassel Neth [facility] Pager [phone] 02/23/2013, 10:28 AM  Patient seen and examined.  Clinical course and management discussed.  Necessary edits performed.  I agree with the above.  Assessment and plan of  care developed and discussed below.    All films reviewed.  MRI shows acute to subacute infarcts in the distribution of the left MCA.  Exam currently nonfocal.   Concerned about anatomy of left ICA.  Plavix added at 75mg  daily.  Patient scheduled for catheter angiogram in AM.  If recurrence of focal symptoms today this may be moved up.  Patient may eat today but will need to return to NPO after midnight  Plan discussed with family.    Thana Farr, MD [facility] [phone]  02/23/2013  2:25 PM

## 2013-02-23 NOTE — Evaluation (Addendum)
Occupational Therapy Evaluation Patient Details Name: Jorge Nixon MRN: [id number] DOB: 1924-06-10 Today's Date: 02/23/2013 Time: 4098-1191 OT Time Calculation (min): 27 min  OT Assessment / Plan / Recommendation History of present illness 79 y.o. s/p pt with hx of HTN/CAD, right UE weakness and MRI/MRA head revealing acute to subacute small infarcts in left post MCA territory, left supraclinoid ICA stenosis with diminished flow to the left ICA terminus, left MCA and left ACA origins.   Clinical Impression   Pt presents with below problem list. Pt will benefit from acute OT to increase independence prior to d/c. Pt independent with ADLs, PTA. Per nursing, pt's wife states processing differences have been noted as of late, and pt was not completely oriented during session. Vision to be further assessed.     OT Assessment  Patient needs continued OT Services    Follow Up Recommendations  Home Health OT;Supervision/Assistance - 24 hour    Barriers to Discharge      Equipment Recommendations  None recommended by OT    Recommendations for Other Services    Frequency  Min 2X/week    Precautions / Restrictions Precautions Precautions: Fall Restrictions Weight Bearing Restrictions: No   Pertinent Vitals/Pain No pain reported.     ADL  Grooming: Set up Where Assessed - Grooming: Unsupported sitting Upper Body Bathing: Set up Where Assessed - Upper Body Bathing: Unsupported sitting Lower Body Bathing: Min guard Where Assessed - Lower Body Bathing: Supported sit to stand Upper Body Dressing: Set up Where Assessed - Upper Body Dressing: Unsupported sitting Lower Body Dressing: Min guard Where Assessed - Lower Body Dressing: Supported sit to Pharmacist, hospital: [employer]graph[employer]or Method: Sit to Barista: Regular height toilet;Grab bars Tub/Shower Transfer: Paramedic Method: Land: Other (comment) (practiced stepping over) Equipment Used: Gait belt;Rolling walker Transfers/Ambulation Related to ADLs: Min guard ADL Comments: Pt practiced simulated tub transfer-Min A to help position feet and cues for technique-pt having difficulty following commands of using wall for support as he was using the door knob. Pt able to don/doff socks while sitting and simulated pulling up LB clothing while standing. Pt simulated LB bathing and unable to clean bottom of feet (which he states he normally doesn't do). OT educated to sit to bathe his LB. Practiced simulated regular height toilet transfer at Min guard level-pt stated that was low...OT advised to use his 3 in 1 he has at home over his commode for now.  Educated to stand in front of bed/chair with walker in front when pulling up LB clothing.    OT Diagnosis: Altered Mental Status; Disturbance of Vision OT Problem List: Decreased strength;Decreased activity tolerance;Impaired balance (sitting and/or standing);Decreased knowledge of use of DME or AE;Decreased knowledge of precautions;Impaired sensation;Decreased cognition;Impaired vision/perception OT Treatment Interventions: Self-care/ADL training;DME and/or AE instruction;Therapeutic activities;Patient/family education;Visual/perceptual remediation/compensation;Balance training   OT Goals(Current goals can be found in the care plan section) Acute Rehab OT Goals Patient Stated Goal: go home OT Goal Formulation: With patient Time For Goal Achievement: 03/02/13 Potential to Achieve Goals: Good ADL Goals Pt Will Perform Lower Body Bathing: with modified independence;sit to/from stand Pt Will Perform Lower Body Dressing: with modified independence;sit to/from stand Pt Will Transfer to Toilet: with modified independence;ambulating (3 in 1 over commode) Pt Will Perform Toileting - Clothing Manipulation and hygiene: with modified independence;sit to/from stand Additional ADL Goal  #1: Pt will participate in further vision assessment.   Visit Information  Last  OT Received On: 02/23/13 Assistance Needed: +1 History of Present Illness: 79 y.o. s/p pt with hx of HTN/CAD, right UE weakness and MRI/MRA head revealing acute to subacute small infarcts in left post MCA territory, left supraclinoid ICA stenosis with diminished flow to the left ICA terminus, left MCA and left ACA origins.       Prior Functioning     Home Living Family/patient expects to be discharged to:: Private residence Living Arrangements: Spouse/significant other Available Help at Discharge: Family;Available 24 hours/day Type of Home: House Home Access: Stairs to enter [employer] of Steps: 4 Entrance Stairs-Rails: Right;Left;Can reach both Home Layout: One level Home Equipment: Walker - 2 wheels;Bedside commode;Shower seat Prior Function Level of Independence: Independent Communication Communication: HOH         Vision/Perception Vision - History Baseline Vision: Wears glasses all the time Patient Visual Report: No change from baseline Vision - Assessment Vision Assessment: Vision tested Tracking/Visual Pursuits: Other (comment) (appeared to have decreased smoothness and difficulty tracking to left side-inconsistent) Visual Fields: Other (comment) (difficult to assess as pt was moving his eyes)   Cognition  Cognition Arousal/Alertness: Awake/alert Behavior During Therapy: WFL for tasks assessed/performed Overall Cognitive Status: No family/caregiver present to determine baseline cognitive functioning Area of Impairment: Orientation;Following commands Orientation Level: Disoriented to;Place Following Commands: Follows one step commands inconsistently General Comments: Per nursing, pt's wife states she has noticed decreased processing in the past couple of days.    Extremity/Trunk Assessment Upper Extremity Assessment Upper Extremity Assessment: RUE deficits/detail;LUE  deficits/detail RUE Deficits / Details: WFL for strength and ROM-slightly less strength with elbow flexion RUE Sensation: decreased light touch LUE Deficits / Details: WFL for strength and ROM LUE Sensation: decreased light touch     Mobility Bed Mobility Bed Mobility: Supine to Sit;Sitting - Scoot to Edge of Bed Supine to Sit: 5: Supervision;HOB flat Sitting - Scoot to Edge of Bed: 5: Supervision Transfers Transfers: Sit to Stand;Stand to Sit Sit to Stand: 4: Min guard;With upper extremity assist;From bed;From chair/3-in-1;From toilet Stand to Sit: 4: Min guard;With upper extremity assist;To chair/3-in-1;To toilet Details for Transfer Assistance: Cues for technique     Exercise     Balance     End of Session OT - End of Session Equipment Utilized During Treatment: Gait belt;Rolling walker Activity Tolerance: Patient tolerated treatment well Patient left: Other (comment) (with PT)  GO     Earlie Raveling OTR/L [phone] 02/23/2013, 4:29 PM

## 2013-02-23 NOTE — Progress Notes (Signed)
INITIAL NUTRITION ASSESSMENT  DOCUMENTATION CODES Per approved criteria  -unable to assess   INTERVENTION: 1.  General healthful diet; encourage intake as needed.  Consider supplements if PO intake not adequate  NUTRITION DIAGNOSIS: Inadequate oral intake related to inability to eat as evidenced by NPO for procedures.   Monitor:  1.  Food/Beverage; pt meeting >/=90% estimated needs with tolerance. 2.  Wt/wt change; monitor trends  Reason for Assessment: MST  79 y.o. male  Admitting Dx: TIA (transient ischemic attack)  ASSESSMENT: Pt admitted for TIA. Pt reportedly unsure about wt loss on admission.  Pt working with PT at time of visit.  Wife not available for interview. Pt wt reviewed in chart and appears stable.  Pt was active at home- walking 1 mile/day per H&P. RD suspects pt with adequate nutrition, however recommend full assessment as able given pt at 87% of his ideal wt.  Will also monitor for diet advancement.   Height: Ht Readings from Last 1 Encounters:  02/22/13 5\' 11"  (1.803 m)    Weight: Wt Readings from Last 1 Encounters:  02/22/13 145 lb (65.772 kg)    Ideal Body Weight: 166 lbs  % Ideal Body Weight: 87%  Wt Readings from Last 10 Encounters:  02/22/13 145 lb (65.772 kg)  02/10/11 142 lb 13.7 oz (64.8 kg)  02/10/11 142 lb 13.7 oz (64.8 kg)    Usual Body Weight: 142 lbs  % Usual Body Weight: 102%  BMI:  Body mass index is 20.23 kg/(m^2).  Estimated Nutritional Needs: Kcal: 1840-1980 Protein: 78-90g Fluid: ~1.8 L/day  Skin: intact  Diet Order: Cardiac  EDUCATION NEEDS: -No education needs identified at this time  No intake or output data in the 24 hours ending 02/23/13 1519  Last BM: 9/26  Labs:   Recent Labs Lab 02/22/13 1339  NA 140  K 3.6  CL 99  CO2 31  BUN 13  CREATININE 1.09  CALCIUM 9.9  GLUCOSE 92    CBG (last 3)  No results found for this basename: GLUCAP,  in the last 72 hours  Scheduled Meds: . aspirin   300 mg Rectal Daily   Or  . aspirin  325 mg Oral Daily  . clopidogrel  75 mg Oral Q breakfast  . enoxaparin (LOVENOX) injection  40 mg Subcutaneous Q24H  . feeding supplement  237 mL Oral BID BM  . levothyroxine  50 mcg Oral QAC breakfast  . omega-3 acid ethyl esters  1 g Oral Daily    Continuous Infusions: . sodium chloride    . sodium chloride 75 mL/hr at 02/23/13 1105    Past Medical History  Diagnosis Date  . Hypertension   . Diverticular disease 02/14/2011    problems swallowing  . GERD (gastroesophageal reflux disease)   . Coronary artery disease   . Kidney stone   . Hypothyroidism   . Pneumonia   . Anemia   . COPD (chronic obstructive pulmonary disease)   . Non-ischemic cardiomyopathy     mild  EF on echo 05/03/2012 55 to 60%  . Nephrolithiasis     lithotripsy 2006  . BPH (benign prostatic hyperplasia)     Past Surgical History  Procedure Laterality Date  . Total hip arthroplasty Right august 6th 2012    right side   . Replacement total knee Left 2006    left knee  . Transurethral resection of prostate  1996  . Knee arthroscopy Right 2010    Loyce Dys, MS RD LDN Clinical  Inpatient Dietitian Pager: [phone] Weekend/After hours pager: [phone]

## 2013-02-23 NOTE — Progress Notes (Signed)
Patient ID: Jorge Nixon, male   DOB: Jun 14, 1924, 79 y.o.   MRN: [id number] Request received for diagnostic cerebral arteriogram in pt with hx of HTN/CAD, right UE weakness and MRI/MRA head revealing acute to subacute small infarcts in left post MCA territory, left supraclinoid ICA stenosis with diminished flow to the left ICA terminus, left MCA and left ACA origins. Additional PMH as below. Exam: pt awake /alert; chest- CTA bilat; heart- RRR; abd- soft,+BS,NT; ext- FROM, no edema, dim pulses LE; face symm., speech nl, tongue midline, pupils equal/EOMI, shoulder shrug nl, strength 5/5 all fours, sens fxn intact (pt reports paresthesias in bilat LE,? PVD), no drift, finger to nose and FMM nl (pt with sl tremor of hands).   Filed Vitals:   02/23/13 0611 02/23/13 1000 02/23/13 1421 02/23/13 1449  BP: 138/74 150/65 197/98 160/86  Pulse: 58 49 58   Temp: 97.8 F (36.6 C) 97.9 F (36.6 C) 98.1 F (36.7 C)   TempSrc: Oral Oral Oral   Resp: 18 18 18    Height:      Weight:      SpO2: 97% 94% 95%    Past Medical History  Diagnosis Date  . Hypertension   . Diverticular disease 02/14/2011    problems swallowing  . GERD (gastroesophageal reflux disease)   . Coronary artery disease   . Kidney stone   . Hypothyroidism   . Pneumonia   . Anemia   . COPD (chronic obstructive pulmonary disease)   . Non-ischemic cardiomyopathy     mild  EF on echo 05/03/2012 55 to 60%  . Nephrolithiasis     lithotripsy 2006  . BPH (benign prostatic hyperplasia)    Past Surgical History  Procedure Laterality Date  . Total hip arthroplasty Right august 6th 2012    right side   . Replacement total knee Left 2006    left knee  . Transurethral resection of prostate  1996  . Knee arthroscopy Right 2010   Dg Chest 2 View  02/22/2013   CLINICAL DATA:  Extremity weakness. Current history of COPD, cardiomyopathy, hypertension, and GE reflux disease.  EXAM: CHEST  2 VIEW  COMPARISON:  CT chest 08/23/2011. Two-view chest  x-ray 08/19/2011, 04/18/2011.  FINDINGS: Cardiac silhouette upper normal in size, unchanged. Thoracic aorta tortuous and atherosclerotic, unchanged. Hilar and mediastinal contours otherwise unremarkable. Scarring and bronchiectasis in the right middle lobe and to a lesser degree the lingula, as noted on the prior CT, unchanged. Stable hyperinflation and emphysematous changes throughout both lungs. Stable mild biapical pleuroparenchymal scarring. Lungs otherwise clear. No pleural effusions. Degenerative changes involving the thoracic spine.  IMPRESSION: No acute cardiopulmonary disease. Stable COPD/emphysema and scar/bronchiectasis in the right middle lobe and to a lesser degree the lingula. Stable biapical pleuroparenchymal scarring.   Electronically Signed   By: Hulan Saas   On: 02/22/2013 19:32   Ct Head Wo Contrast  02/22/2013   CLINICAL DATA:  Right arm weakness. Numbness.  EXAM: CT HEAD WITHOUT CONTRAST  TECHNIQUE: Contiguous axial images were obtained from the base of the skull through the vertex without intravenous contrast.  COMPARISON:  And branch the reported CT head without contrast [facility] Images are no longer available.  FINDINGS: Mild generalized atrophy and white matter disease is present bilaterally. No acute cortical infarct, hemorrhage, or mass lesion is present. The ventricles are proportionate to the degree of atrophy. No significant extra-axial fluid collection is present.  The paranasal sinuses and mastoid air cells are clear.  The osseous skull is intact. Atherosclerotic calcifications are present within the cavernous carotid arteries and at the dural margin of the vertebral arteries.  IMPRESSION: 1. No acute intracranial abnormality. 2. Moderate atrophy and white matter disease likely reflects the sequela of chronic microvascular ischemia. 3. Atherosclerosis.   Electronically Signed   By: Gennette Pac   On: 02/22/2013 14:23   Mr Maxine Glenn Head Wo  Contrast  02/22/2013   CLINICAL DATA:  79 year old male with weakness and headache.  EXAM: MRI HEAD WITHOUT CONTRAST  MRA HEAD WITHOUT CONTRAST  TECHNIQUE: Multiplanar, multiecho pulse sequences of the brain and surrounding structures were obtained without intravenous contrast. Angiographic images of the head were obtained using MRA technique without contrast.  COMPARISON:  Head CT 02/22/2013.  FINDINGS: Study is intermittently degraded by motion artifact despite repeated imaging attempts.  MRI HEAD FINDINGS  There is increased trace diffusion signal in the left posterior frontal lobe and parietal lobe (series 5, image 41) correspond to isointense to restricted diffusion (series 6, image 18). Associated T2 and FLAIR hyperintensity, plus superimposed fairly confluent bilateral corona radiata T2 and FLAIR increased signal. No associated mass effect or hemorrhage. Probably punctate sensory cortex involvement on series 5, image 44.  No other diffusion abnormality identified. Major intracranial vascular flow voids are preserved.  Cerebral volume is within normal limits for age. No ventriculomegaly. No midline shift, mass effect, or evidence of intracranial mass lesion. No acute intracranial hemorrhage identified. No cortical encephalomalacia identified. Deep gray matter nuclei, brainstem and cerebellum within normal limits for age.  Negative pituitary, cervicomedullary junction visualized cervical spine. Normal bone marrow signal. Visualized orbit soft tissues are within normal limits. Visualized paranasal sinuses and mastoids are clear. Negative scalp soft tissues.  MRA HEAD FINDINGS  Antegrade flow in the posterior circulation with fairly codominant distal vertebral arteries. Normal right PICA. Patent vertebrobasilar junction. Mild basilar tortuosity and irregularity without stenosis. SCA and PCA origins within normal limits. Posterior communicating artery is diminutive or absent.  Antegrade flow in both ICA siphons  with irregularity and asymmetric decreased signal of the distal left ICA siphon. Appearance raises the possibility of a hemodynamically significant stenosis in the supra clinoid segment (series 109, image 28). Subsequent lead asymmetric decreased flow signal at the left ICA terminus, left MCA, and left ACA origins.  Negative right ICA siphon, right MCA and ACA origins. Diminutive anterior communicating artery. Visualized ACA flow signal beyond the A1 segment on the left is within normal limits. Visualized right MCA branches are within normal limits.  Decreased flow signal in the left MCA M1 segment still, the left MCA bifurcation is patent. No major left MCA branch occlusion or high-grade stenosis identified.  IMPRESSION: MRI HEAD IMPRESSION  1. Acute to subacute small infarcts in the left posterior MCA territory mostly affecting white matter. No mass effect or hemorrhage.  2. Underlying bilateral nonspecific cerebral white matter signal changes, favor chronic small vessel disease.  MRA HEAD IMPRESSION  1. Constellation of findings suggestive of hemodynamically significant stenosis in the left supra clinoid ICA segment, resulting in decreased flow at the left ICA terminus, left MCA, and left ACA origins.  2. No major left MCA branch occlusion. Left ACA flow is symmetric to that on the right be on the anterior communicating artery.  Study discussed by telephone with Dr. Thea Silversmith SHORT on 02/22/2013 at 16:34 .   Electronically Signed   By: Augusto Gamble M.D.   On: 02/22/2013 16:38   Mr Brain Wo Contrast  02/22/2013  CLINICAL DATA:  79 year old male with weakness and headache.  EXAM: MRI HEAD WITHOUT CONTRAST  MRA HEAD WITHOUT CONTRAST  TECHNIQUE: Multiplanar, multiecho pulse sequences of the brain and surrounding structures were obtained without intravenous contrast. Angiographic images of the head were obtained using MRA technique without contrast.  COMPARISON:  Head CT 02/22/2013.  FINDINGS: Study is intermittently  degraded by motion artifact despite repeated imaging attempts.  MRI HEAD FINDINGS  There is increased trace diffusion signal in the left posterior frontal lobe and parietal lobe (series 5, image 41) correspond to isointense to restricted diffusion (series 6, image 18). Associated T2 and FLAIR hyperintensity, plus superimposed fairly confluent bilateral corona radiata T2 and FLAIR increased signal. No associated mass effect or hemorrhage. Probably punctate sensory cortex involvement on series 5, image 44.  No other diffusion abnormality identified. Major intracranial vascular flow voids are preserved.  Cerebral volume is within normal limits for age. No ventriculomegaly. No midline shift, mass effect, or evidence of intracranial mass lesion. No acute intracranial hemorrhage identified. No cortical encephalomalacia identified. Deep gray matter nuclei, brainstem and cerebellum within normal limits for age.  Negative pituitary, cervicomedullary junction visualized cervical spine. Normal bone marrow signal. Visualized orbit soft tissues are within normal limits. Visualized paranasal sinuses and mastoids are clear. Negative scalp soft tissues.  MRA HEAD FINDINGS  Antegrade flow in the posterior circulation with fairly codominant distal vertebral arteries. Normal right PICA. Patent vertebrobasilar junction. Mild basilar tortuosity and irregularity without stenosis. SCA and PCA origins within normal limits. Posterior communicating artery is diminutive or absent.  Antegrade flow in both ICA siphons with irregularity and asymmetric decreased signal of the distal left ICA siphon. Appearance raises the possibility of a hemodynamically significant stenosis in the supra clinoid segment (series 109, image 28). Subsequent lead asymmetric decreased flow signal at the left ICA terminus, left MCA, and left ACA origins.  Negative right ICA siphon, right MCA and ACA origins. Diminutive anterior communicating artery. Visualized ACA flow  signal beyond the A1 segment on the left is within normal limits. Visualized right MCA branches are within normal limits.  Decreased flow signal in the left MCA M1 segment still, the left MCA bifurcation is patent. No major left MCA branch occlusion or high-grade stenosis identified.  IMPRESSION: MRI HEAD IMPRESSION  1. Acute to subacute small infarcts in the left posterior MCA territory mostly affecting white matter. No mass effect or hemorrhage.  2. Underlying bilateral nonspecific cerebral white matter signal changes, favor chronic small vessel disease.  MRA HEAD IMPRESSION  1. Constellation of findings suggestive of hemodynamically significant stenosis in the left supra clinoid ICA segment, resulting in decreased flow at the left ICA terminus, left MCA, and left ACA origins.  2. No major left MCA branch occlusion. Left ACA flow is symmetric to that on the right be on the anterior communicating artery.  Study discussed by telephone with Dr. Thea Silversmith SHORT on 02/22/2013 at 16:34 .   Electronically Signed   By: Augusto Gamble M.D.   On: 02/22/2013 16:38   US Carotid Duplex Bilateral  02/22/2013   CLINICAL DATA:  Right arm weakness and numbness, TIA, history hypertension, coronary artery disease, non ischemic cardiomyopathy, COPD  EXAM: BILATERAL CAROTID DUPLEX ULTRASOUND  TECHNIQUE: Wallace Cullens scale imaging, color Doppler and duplex ultrasound were performed of bilateral carotid and vertebral arteries in the neck.  COMPARISON:  03/25/2004  FINDINGS: Criteria: Quantification of carotid stenosis is based on velocity parameters that correlate the residual internal carotid diameter with NASCET-based stenosis levels,  using the diameter of the distal internal carotid lumen as the denominator for stenosis measurement.  The following velocity measurements were obtained:  RIGHT  ICA:  92/33 Cm/sec  CCA:  73/18 cm/sec  SYSTOLIC ICA/CCA RATIO:  1.26  DIASTOLIC ICA/CCA RATIO:  1.82  ECA:  109 cm/sec  LEFT  ICA:  168/19 cm/sec  CCA:   65/8 cm/sec  SYSTOLIC ICA/CCA RATIO:  2.60  DIASTOLIC ICA/CCA RATIO:  2.35  ECA:  144 cm/sec  RIGHT CAROTID ARTERY: Intimal thickening and noncalcified plaque right CCA. More prominent plaques at right carotid bulb into proximal right ICA, both hypoechoic and calcified non shadowing plaques. Laminar flow on color Doppler imaging with mild spectral broadening in right ICA on waveform analysis. No high velocity jets.  RIGHT VERTEBRAL ARTERY:  Patent, antegrade  LEFT CAROTID ARTERY: Intimal thickening and noncalcified plaque left CCA. More pronounced noncalcified plaque at left carotid bulb with calcified and noncalcified plaque at the proximal left ICA. Associated wih turbulent blood flow in left ICA on color Doppler imaging and spectral broadening on waveform analysis. Increased velocity present.  LEFT VERTEBRAL ARTERY:  Patent, antegrade  IMPRESSION: Plaque formation in the carotid systems as above.  Velocities within the left ICA correspond to a 50-69% diameter stenosis.  Velocities within the right ICA correspond to a less than 50% diameter stenosis.   Electronically Signed   By: Ulyses Southward M.D.   On: 02/22/2013 17:21  Results for orders placed during the hospital encounter of 02/22/13  CBC WITH DIFFERENTIAL      Result Value Range   WBC 6.0  4.0 - 10.5 K/uL   RBC 4.28  4.22 - 5.81 MIL/uL   Hemoglobin 12.9 (*) 13.0 - 17.0 g/dL   HCT 47.8 (*) 29.5 - 62.1 %   MCV 89.3  78.0 - 100.0 fL   MCH 30.1  26.0 - 34.0 pg   MCHC 33.8  30.0 - 36.0 g/dL   RDW 30.8  65.7 - 84.6 %   Platelets 156  150 - 400 K/uL   Neutrophils Relative % 58  43 - 77 %   Neutro Abs 3.5  1.7 - 7.7 K/uL   Lymphocytes Relative 31  12 - 46 %   Lymphs Abs 1.8  0.7 - 4.0 K/uL   Monocytes Relative 8  3 - 12 %   Monocytes Absolute 0.5  0.1 - 1.0 K/uL   Eosinophils Relative 3  0 - 5 %   Eosinophils Absolute 0.2  0.0 - 0.7 K/uL   Basophils Relative 1  0 - 1 %   Basophils Absolute 0.0  0.0 - 0.1 K/uL  BASIC METABOLIC PANEL      Result  Value Range   Sodium 140  135 - 145 mEq/L   Potassium 3.6  3.5 - 5.1 mEq/L   Chloride 99  96 - 112 mEq/L   CO2 31  19 - 32 mEq/L   Glucose, Bld 92  70 - 99 mg/dL   BUN 13  6 - 23 mg/dL   Creatinine, Ser 9.62  0.50 - 1.35 mg/dL   Calcium 9.9  8.4 - 95.2 mg/dL   GFR calc non Af Amer 59 (*) >90 mL/min   GFR calc Af Amer 69 (*) >90 mL/min   A/P: Pt with hx of hypertension, intermittent RUE weakness and findings of acute to subacute small infarcts in left post MCA territory as well as stenosis of left supraclinoid ICA with diminished flow to left ICA terminus, left MCA  and left ACA origins. Plan is for diagnostic cerebral arteriogram on 9/28 to further assess degree of cerebrovascular disease. Details/risks of procedure d/w pt/wife/daughter with their understanding and consent. Dr. Corliss Skains aware of pt's clinical hx/imaging findings.

## 2013-02-23 NOTE — Evaluation (Signed)
Physical Therapy Evaluation Patient Details Name: Jorge Nixon MRN: [id number] DOB: Jul 22, 1924 Today's Date: 02/23/2013 Time: 4098-1191 PT Time Calculation (min): 23 min  PT Assessment / Plan / Recommendation History of Present Illness  79 y.o. s/p pt with hx of HTN/CAD, right UE weakness and MRI/MRA head revealing acute to subacute small infarcts in left post MCA territory, left supraclinoid ICA stenosis with diminished flow to the left ICA terminus, left MCA and left ACA origins.  Clinical Impression  Pt is a very active individual at home, and has 5/5 strength in bilateral LE's. Some weakness and sensation differences noted in R UE, and pt has noted some inconsistent L sided cervical pain. Per nursing, pt's wife states processing differences have been noted as of late, and pt was not completely oriented during session. Some instability noted when not using RW, but pt appears overall safe with walker use. This patient would benefit from continued PT services in the acute care setting to address functional limitations, return to PLOF and decrease fall risk at d/c.  PT Assessment  Patient needs continued PT services    Follow Up Recommendations  No PT follow up    Does the patient have the potential to tolerate intense rehabilitation      Barriers to Discharge        Equipment Recommendations  None recommended by PT    Recommendations for Other Services     Frequency Min 3X/week    Precautions / Restrictions Precautions Precautions: Fall Restrictions Weight Bearing Restrictions: No   Pertinent Vitals/Pain No pain noted during session.      Mobility  Bed Mobility Bed Mobility: Supine to Sit;Sitting - Scoot to Edge of Bed Supine to Sit: 5: Supervision;HOB flat Sitting - Scoot to Edge of Bed: 5: Supervision Sit to Supine: 5: Supervision;HOB flat;With rail Scooting to [facility]: 6: Modified independent (Device/Increase time);With rail Details for Bed Mobility Assistance: Pt was able  to perform transfer from EOB with safety awareness and good technique. Transfers Transfers: Sit to Stand;Stand to Sit Sit to Stand: 4: Min guard;With upper extremity assist;From bed;From chair/3-in-1;From toilet Stand to Sit: 4: Min guard;With upper extremity assist;To chair/3-in-1;To toilet Details for Transfer Assistance: Cues for technique Ambulation/Gait Ambulation/Gait Assistance: 5: Supervision Ambulation Distance (Feet): 175 Feet Assistive device: Rolling walker Ambulation/Gait Assistance Details: VC's for walker placement close to pt's body, and improved posture with head looking up. Gait Pattern: Within Functional Limits Gait velocity: decreased slightly Stairs: No Wheelchair Mobility Wheelchair Mobility: No Modified Rankin (Stroke Patients Only) Pre-Morbid Rankin Score: No significant disability Modified Rankin: Slight disability    Exercises     PT Diagnosis: Difficulty walking  PT Problem List: Decreased strength;Decreased range of motion;Decreased activity tolerance;Decreased balance;Decreased mobility;Decreased knowledge of use of DME;Decreased safety awareness;Decreased knowledge of precautions PT Treatment Interventions: DME instruction;Gait training;Stair training;Functional mobility training;Therapeutic activities;Therapeutic exercise;Neuromuscular re-education;Patient/family education     PT Goals(Current goals can be found in the care plan section) Acute Rehab PT Goals Patient Stated Goal: go home PT Goal Formulation: With patient Time For Goal Achievement: 03/02/13 Potential to Achieve Goals: Good  Visit Information  Last PT Received On: 02/23/13 Assistance Needed: +1 History of Present Illness: 79 y.o. s/p pt with hx of HTN/CAD, right UE weakness and MRI/MRA head revealing acute to subacute small infarcts in left post MCA territory, left supraclinoid ICA stenosis with diminished flow to the left ICA terminus, left MCA and left ACA origins.       Prior  Functioning  Home Living  Family/patient expects to be discharged to:: Private residence Living Arrangements: Spouse/significant other Available Help at Discharge: Family;Available 24 hours/day Type of Home: House Home Access: Stairs to enter [employer] of Steps: 4 Entrance Stairs-Rails: Right;Left;Can reach both Home Layout: One level Home Equipment: Walker - 2 wheels;Bedside commode;Shower seat Prior Function Level of Independence: Independent Communication Communication: Surveyor, mining Arousal/Alertness: Awake/alert Behavior During Therapy: WFL for tasks assessed/performed Overall Cognitive Status: No family/caregiver present to determine baseline cognitive functioning Area of Impairment: Orientation;Following commands Orientation Level: Disoriented to;Place Following Commands: Follows one step commands inconsistently General Comments: Per nursing, pt's wife states she has noticed decreased processing in the past couple of days.    Extremity/Trunk Assessment Upper Extremity Assessment Upper Extremity Assessment: RUE deficits/detail;LUE deficits/detail RUE Deficits / Details: WFL for strength and ROM-slightly less strength with elbow flexion RUE Sensation: decreased light touch LUE Deficits / Details: WFL for strength and ROM LUE Sensation: decreased light touch Lower Extremity Assessment Lower Extremity Assessment: RLE deficits/detail;LLE deficits/detail RLE Deficits / Details: 5/5 strength grossly  RLE Sensation: history of peripheral neuropathy (plantar foot) LLE Deficits / Details: 5/5 strength grossly LLE Sensation: history of peripheral neuropathy (plantar foot) Cervical / Trunk Assessment Cervical / Trunk Assessment: Other exceptions Cervical / Trunk Exceptions: Pt reports pain in L side of neck recently. Pain is inconsistent and is reportedly related to neck movement/positioning   Balance Balance Balance Assessed: Yes Static Sitting  Balance Static Sitting - Balance Support: Bilateral upper extremity supported;Feet supported Static Sitting - Level of Assistance: 6: Modified independent (Device/Increase time) Static Sitting - Comment/# of Minutes: 3 Static Standing Balance Static Standing - Balance Support: Bilateral upper extremity supported Static Standing - Level of Assistance: 5: Stand by assistance Static Standing - Comment/# of Minutes: 1  End of Session PT - End of Session Equipment Utilized During Treatment: Gait belt Activity Tolerance: Patient tolerated treatment well Patient left: in bed;with call bell/phone within reach Nurse Communication: Mobility status  GP Functional Assessment Tool Used: Clinical Judgement Functional Limitation: Mobility: Walking and moving around Mobility: Walking and Moving Around Current Status [phone]): At least 20 percent but less than 40 percent impaired, limited or restricted Mobility: Walking and Moving Around Goal Status [phone]): At least 20 percent but less than 40 percent impaired, limited or restricted   Ruthann Cancer 02/23/2013, 4:30 PM  Ruthann Cancer, PT, DPT Acute Rehabilitation Services

## 2013-02-23 NOTE — Progress Notes (Signed)
  Echocardiogram 2D Echocardiogram has been performed.  Jorge Nixon 02/23/2013, 5:47 PM

## 2013-02-24 ENCOUNTER — Inpatient Hospital Stay (HOSPITAL_COMMUNITY): Payer: Medicare Other

## 2013-02-24 DIAGNOSIS — G459 Transient cerebral ischemic attack, unspecified: Secondary | ICD-10-CM | POA: Diagnosis not present

## 2013-02-24 DIAGNOSIS — I10 Essential (primary) hypertension: Secondary | ICD-10-CM | POA: Diagnosis not present

## 2013-02-24 DIAGNOSIS — I2510 Atherosclerotic heart disease of native coronary artery without angina pectoris: Secondary | ICD-10-CM

## 2013-02-24 DIAGNOSIS — I6350 Cerebral infarction due to unspecified occlusion or stenosis of unspecified cerebral artery: Secondary | ICD-10-CM

## 2013-02-24 DIAGNOSIS — I6529 Occlusion and stenosis of unspecified carotid artery: Secondary | ICD-10-CM

## 2013-02-24 DIAGNOSIS — E43 Unspecified severe protein-calorie malnutrition: Secondary | ICD-10-CM | POA: Diagnosis not present

## 2013-02-24 LAB — CBC
HCT: 33.3 % — ABNORMAL LOW (ref 39.0–52.0)
MCV: 87.6 fL (ref 78.0–100.0)
Platelets: 124 10*3/uL — ABNORMAL LOW (ref 150–400)
RBC: 3.8 MIL/uL — ABNORMAL LOW (ref 4.22–5.81)
WBC: 4.5 10*3/uL (ref 4.0–10.5)

## 2013-02-24 LAB — BASIC METABOLIC PANEL
CO2: 24 mEq/L (ref 19–32)
Chloride: 106 mEq/L (ref 96–112)
Creatinine, Ser: 1.2 mg/dL (ref 0.50–1.35)

## 2013-02-24 LAB — LIPID PANEL
Cholesterol: 167 mg/dL (ref 0–200)
HDL: 25 mg/dL — ABNORMAL LOW (ref >39)
LDL Cholesterol: 120 mg/dL — ABNORMAL HIGH (ref 0–99)
Total CHOL/HDL Ratio: 6.7 RATIO
Triglycerides: 109 mg/dL (ref <150)
VLDL: 22 mg/dL (ref 0–40)

## 2013-02-24 LAB — PROTIME-INR
INR: 1.08 (ref 0.00–1.49)
Prothrombin Time: 13.8 seconds (ref 11.6–15.2)

## 2013-02-24 MED ORDER — HYDRALAZINE HCL 20 MG/ML IJ SOLN
INTRAMUSCULAR | Status: AC
  Filled 2013-02-24: qty 1

## 2013-02-24 MED ORDER — MIDAZOLAM HCL 2 MG/2ML IJ SOLN
INTRAMUSCULAR | Status: AC | PRN
  Administered 2013-02-24: 1 mg via INTRAVENOUS

## 2013-02-24 MED ORDER — SIMVASTATIN 20 MG PO TABS
20.0000 mg | ORAL_TABLET | Freq: Every day | ORAL | Status: DC
  Administered 2013-02-24: 20 mg via ORAL
  Filled 2013-02-24 (×2): qty 1

## 2013-02-24 MED ORDER — ONDANSETRON HCL 4 MG/2ML IJ SOLN: 4.0000 mg | Freq: Four times a day (QID) | INTRAMUSCULAR | Status: DC | PRN

## 2013-02-24 MED ORDER — IOHEXOL 300 MG/ML  SOLN
150.0000 mL | Freq: Once | INTRAMUSCULAR | Status: AC | PRN
  Administered 2013-02-24: 60 mL via INTRA_ARTERIAL

## 2013-02-24 MED ORDER — HYDRALAZINE HCL 20 MG/ML IJ SOLN
INTRAMUSCULAR | Status: AC | PRN
  Administered 2013-02-24: 5 mg via INTRAVENOUS

## 2013-02-24 MED ORDER — SODIUM CHLORIDE 0.9 % IV SOLN: INTRAVENOUS | Status: AC

## 2013-02-24 MED ORDER — HEPARIN SOD (PORK) LOCK FLUSH 100 UNIT/ML IV SOLN
INTRAVENOUS | Status: AC | PRN
  Administered 2013-02-24: 1000 [IU] via INTRAVENOUS

## 2013-02-24 MED ORDER — FENTANYL CITRATE 0.05 MG/ML IJ SOLN
INTRAMUSCULAR | Status: AC | PRN
  Administered 2013-02-24: 25 ug via INTRAVENOUS

## 2013-02-24 NOTE — Progress Notes (Signed)
Stroke Team Progress Note  HISTORY  Jorge Nixon is an 79 y.o. male admitted to [facility] last night, 02/22/2013, in transfer from [facility] for further evaluation of two transient episodes of right upper extremity weakness consistent with TIAs. The history was obtained from the patient and his wife. Approximately 3 weeks ago after mowing the lawn the patient was noted to have weakness of his right upper extremity from the elbow on down. This episode lasted approximately 30 minutes. They did not seek medical attention at that time.   Yesterday, 02/22/2013, at approximately 12:30 after lunch the patient once again developed weakness of his right upper extremity. The patient's wife reported that this episode was much more severe as it involved the entire arm and the patient was unable to move his arm at all. Apparently this episode lasted approximately one hour. They came to the emergency department for further evaluation.   An MRI of the head showed acute to subacute small infarcts in the left posterior middle cerebral artery territory mostly affecting the white matter. An MRA of the head was consistent with hemodynamically significant stenosis in the left supraclinoid internal carotid artery segment with decreased flow to the left internal carotid artery terminus, left middle cerebral artery, and left anterior communicating origins. There was no major left middle cerebral artery branch occlusion.   In talking with the patient and his wife they would like to be aggressive in this situation in order to prevent a significant stroke. He and his wife stay active and usually walk at least a mile per day. The patient was on aspirin 81 mg daily PTA.  Date last known well: Date: 02/22/2013  Time last known well: Time: 12:30  tPA Given: No: TPA was not given as the patient's symptoms quickly resolved    SUBJECTIVE The patient's wife and daughter-in-law are present this morning. The  patient states he feels fine. He has had no further episodes of right upper extremity weakness. He is on his way to radiology for the catheter cerebral angiogram to be performed by Dr. Corliss Skains. All questions have been answered.  OBJECTIVE Most recent Vital Signs: Filed Vitals:   02/23/13 1449 02/23/13 1800 02/23/13 2200 02/24/13 0200  BP: 160/86 164/84 143/70 149/81  Pulse:  55 53 57  Temp:  98.1 F (36.7 C) 97.6 F (36.4 C) 97.9 F (36.6 C)  TempSrc:  Oral    Resp:  18 18 18   Height:      Weight:      SpO2:  97% 97% 98%   CBG (last 3)  No results found for this basename: GLUCAP,  in the last 72 hours  IV Fluid Intake:   . sodium chloride    . sodium chloride 50 mL/hr at 02/24/13 0356    MEDICATIONS  . aspirin  300 mg Rectal Daily   Or  . aspirin  325 mg Oral Daily  . clopidogrel  75 mg Oral Q breakfast  . enoxaparin (LOVENOX) injection  40 mg Subcutaneous Q24H  . feeding supplement  237 mL Oral BID BM  . levothyroxine  50 mcg Oral QAC breakfast  . omega-3 acid ethyl esters  1 g Oral Daily   PRN:  acetaminophen, acetaminophen, ondansetron (ZOFRAN) IV, senna-docusate  Diet:  NPO for angiogram today. Otherwise okay for heart healthy diet with thin liquids Activity:  Bedrest following angiogram. Then up with assistance. DVT Prophylaxis:  Lovenox  CLINICALLY SIGNIFICANT STUDIES Basic Metabolic Panel:   Recent Labs  Lab 02/22/13 1339 02/24/13 0550  NA 140 140  K 3.6 3.9  CL 99 106  CO2 31 24  GLUCOSE 92 89  BUN 13 15  CREATININE 1.09 1.20  CALCIUM 9.9 8.5   Liver Function Tests: No results found for this basename: AST, ALT, ALKPHOS, BILITOT, PROT, ALBUMIN,  in the last 168 hours CBC:   Recent Labs Lab 02/22/13 1339 02/24/13 0550  WBC 6.0 4.5  NEUTROABS 3.5  --   HGB 12.9* 11.4*  HCT 38.2* 33.3*  MCV 89.3 87.6  PLT 156 124*   Coagulation:   Recent Labs Lab 02/24/13 0550  LABPROT 13.8  INR 1.08   Cardiac Enzymes: No results found for this  basename: CKTOTAL, CKMB, CKMBINDEX, TROPONINI,  in the last 168 hours Urinalysis: No results found for this basename: COLORURINE, APPERANCEUR, LABSPEC, PHURINE, GLUCOSEU, HGBUR, BILIRUBINUR, KETONESUR, PROTEINUR, UROBILINOGEN, NITRITE, LEUKOCYTESUR,  in the last 168 hours Lipid Panel No results found for this basename: chol,  trig,  hdl,  cholhdl,  vldl,  ldlcalc   HgbA1C  No results found for this basename: HGBA1C    Urine Drug Screen:   No results found for this basename: labopia,  cocainscrnur,  labbenz,  amphetmu,  thcu,  labbarb    Alcohol Level: No results found for this basename: ETH,  in the last 168 hours  Dg Chest 2 View 02/22/2013    No acute cardiopulmonary disease. Stable COPD/emphysema and scar/bronchiectasis in the right middle lobe and to a lesser degree the lingula. Stable biapical pleuroparenchymal scarring.    Ct Head Wo Contrast 02/22/2013    1. No acute intracranial abnormality. 2. Moderate atrophy and white matter disease likely reflects the sequela of chronic microvascular ischemia. 3. Atherosclerosis.    Mri Head Wo Contrast 02/22/2013    1. Acute to subacute small infarcts in the left posterior MCA territory mostly affecting white matter. No mass effect or hemorrhage.  2. Underlying bilateral nonspecific cerebral white matter signal changes, favor chronic small vessel disease.    Mra Brain Wo Contrast 02/22/2013 1. Constellation of findings suggestive of hemodynamically significant stenosis in the left supra clinoid ICA segment, resulting in decreased flow at the left ICA terminus, left MCA, and left ACA origins.  2. No major left MCA branch occlusion. Left ACA flow is symmetric to that on the right be on the anterior communicating artery.   US Carotid Duplex Bilateral 02/22/2013    Plaque formation in the carotid systems as above.  Velocities within the left ICA correspond to a 50-69% diameter stenosis.  Velocities within the right ICA correspond to a less than  50% diameter stenosis.  2D Echocardiogram  Ejection fraction 45% with diffuse hypokinesis and mild valvular abnormalities.. Grade 2 diastolic dysfunction. No cardiac source of emboli identified.   EKG  Sinus rhythm rate 69 beats per minute with sinus arrhythmia and septal infarct. (Official reading pending)   Therapy Recommendations - home health occupational therapy recommended with 24-hour per day supervision.  Physical Exam    General - pleasant 79 year old male in no acute distress  Neck - bilateral carotid bruits  Heart - Regular rate and rhythm - distant heart sounds  Lungs - Clear to auscultation  Abdomen - Soft - non tender  Extremities - weak to absent - no edema  Skin - Warm and dry    Neurologic Examination:  Mental Status:  Alert, oriented, thought content appropriate. Speech fluent without evidence of aphasia. Able to follow 3 step commands without difficulty.  Cranial Nerves:  II: Discs not visualized; Visual fields grossly normal, pupils equal, round, reactive to light and accommodation  III,IV, VI: ptosis not present, extra-ocular motions intact bilaterally  V,VII: smile symmetric, facial light touch sensation normal bilaterally  VIII: hearing normal bilaterally  IX,X: gag reflex present  XI: bilateral shoulder shrug  XII: midline tongue extension  Motor:  Right : Upper extremity 5/5 Left: Upper extremity 5/5  Lower extremity 5/5 Lower extremity 5/5  Tone and bulk:normal tone throughout; no atrophy noted  Sensory: light touch intact throughout, bilaterally  Deep Tendon Reflexes: 2+ and symmetric throughout  Plantars:  Right: downgoing Left: downgoing  Cerebellar:  normal finger-to-nose except for mild tremor bilaterally  Gait: Deferred   ASSESSMENT Jorge Nixon is a 79 y.o. male presenting with 2 episodes of transient right upper extremity paresis. TPA was not given as deficits had resolved quickly. Imaging confirms acute to subacute small infarcts in  the left posterior MCA territory and  MRA revealed a hemodynamically significant stenosis in the left supra clinoid ICA segment. Infarct felt to be thrombotic due to to large vessel disease. On aspirin 81 mg orally every day prior to admission. Now on aspirin 325 mg orally every day and clopidogrel 75 mg orally every day for secondary stroke prevention. Patient with resultant resolution of deficits. Work up underway.   Catheter cerebral angiogram to be performed this morning by Dr. Corliss Skains.  Hypertension  Coronary artery disease with left ventricular dysfunction - ejection fraction 45%.  COPD by chest x-ray; however, patient never smoked.  Lipid panel and hemoglobin A1c canceled. Will reorder.  Hospital day # 2  TREATMENT/PLAN  Continue aspirin 325 mg orally every day and clopidogrel 75 mg orally every day for secondary stroke prevention until results of angiogram are known and plan of treatment is established.  Home health occupational therapy recommended.  Await hemoglobin A1c and lipid panel.  Await angiogram results.  Cerebral angiogram shows 80% stenosis of the proximal L ICA, likely the cause of the stroke events. Dr. Corliss Skains is considering a carotid stent, but I would consider getting a surgical consult given the current literature regarding CEA VS stenting in individuals over the age of 47. I discussed this with the patient, and they are amenable to having a surgical consult.  Delton See PA-C [facility] Pager [phone] 02/24/2013, 7:55 AM  I have personally obtained a history, examined the patient, evaluated imaging results, and formulated the assessment and plan of care. I agree with the above.  Lesly Dukes

## 2013-02-24 NOTE — Consult Note (Signed)
[facility]  New Carotid Patient  Referred by:  Dr. Brien Few ([facility])  Reason for referral: Left side CVA  History of Present Illness  CHRISTOHPER Nixon is a 79 y.o. (20-Mar-1925) male who presents with chief complaint: right arm numbness.  Two weeks ago the patient had an episode of right forearm numbness that resolved.  Last Friday, the patient had numbness of the entire right arm which resolved within one hour.  The patient has never had amaurosis fugax or monocular blindness.  The patient has never had facial drooping or hemiplegia.  The patient has never had receptive or expressive aphasia.   The patient's previous neurologic deficits have resolved.  The patient's risks factors for carotid disease include: HTN, hyperlipidemia.  Past Medical History  Diagnosis Date  . Hypertension   . Diverticular disease 02/14/2011    problems swallowing  . GERD (gastroesophageal reflux disease)   . Coronary artery disease   . Kidney stone   . Hypothyroidism   . Pneumonia   . Anemia   . COPD (chronic obstructive pulmonary disease)   . Non-ischemic cardiomyopathy     mild  EF on echo 05/03/2012 55 to 60%  . Nephrolithiasis     lithotripsy 2006  . BPH (benign prostatic hyperplasia)     Past Surgical History  Procedure Laterality Date  . Total hip arthroplasty Right august 6th 2012    right side   . Replacement total knee Left 2006    left knee  . Transurethral resection of prostate  1996  . Knee arthroscopy Right 2010    History   Social History  . Marital Status: Married    Spouse Name: N/A    Number of Children: 1  . Years of Education: N/A   Occupational History  .     Social History Main Topics  . Smoking status: Never Smoker   . Smokeless tobacco: Not on file  . Alcohol Use: No  . Drug Use: No  . Sexual Activity:    Other Topics Concern  . Not on file   Social History Narrative  . No narrative on file    Family History  Problem  Relation Age of Onset  . Stroke Father   . Hypertension    . Diabetes     No current facility-administered medications on file prior to encounter.   Current Outpatient Prescriptions on File Prior to Encounter  Medication Sig Dispense Refill  . acetaminophen (TYLENOL) 500 MG tablet Take 500 mg by mouth every 6 (six) hours as needed.        [name] aspirin EC 81 MG tablet Take 81 mg by mouth daily.      . fish oil-omega-3 fatty acids 1000 MG capsule Take 1 g by mouth daily. Takes one capsule daily      . losartan-hydrochlorothiazide (HYZAAR) 50-12.5 MG per tablet Take 0.5 tablets by mouth daily. Takes 1/2 tablet daily       Inpatient Medications . aspirin  300 mg Rectal Daily   Or  . aspirin  325 mg Oral Daily  . clopidogrel  75 mg Oral Q breakfast  . enoxaparin (LOVENOX) injection  40 mg Subcutaneous Q24H  . feeding supplement  237 mL Oral BID BM  . levothyroxine  50 mcg Oral QAC breakfast  . omega-3 acid ethyl esters  1 g Oral Daily  . simvastatin  20 mg Oral q1800     Allergies  Allergen Reactions  . Sulfa Antibiotics  Rash    REVIEW OF SYSTEMS:  (Positives checked otherwise negative)  CARDIOVASCULAR:  []  chest pain, []  chest pressure, []  palpitations, []  shortness of breath when laying flat, []  shortness of breath with exertion,  []  pain in feet when walking, []  pain in feet when laying flat, []  history of blood clot in veins (DVT), []  history of phlebitis, []  swelling in legs, []  varicose veins  PULMONARY:  []  productive cough, []  asthma, []  wheezing  NEUROLOGIC:  []  weakness in arms or legs, []  numbness in arms or legs, []  difficulty speaking or slurred speech, []  temporary loss of vision in one eye, []  dizziness  HEMATOLOGIC:  []  bleeding problems, []  problems with blood clotting too easily  MUSCULOSKEL:  []  joint pain, []  joint swelling  GASTROINTEST:  []  vomiting blood, []  blood in stool     GENITOURINARY:  []  burning with urination, []  blood in urine  PSYCHIATRIC:  []   history of major depression  INTEGUMENTARY:  []  rashes, []  ulcers  CONSTITUTIONAL:  []  fever, []  chills  For VQI Use Only  PRE-ADM LIVING: Home  AMB STATUS: Ambulatory  CAD Sx: None  PRIOR CHF: None  STRESS TEST: [x]  No, [ ]  Normal, [ ]  + ischemia, [ ]  + MI, [ ]  Both  Physical Examination  Filed Vitals:   02/24/13 0835 02/24/13 0841 02/24/13 0845 02/24/13 1100  BP: 150/72 159/77 151/80 137/67  Pulse: 58 61 60 62  Temp:    97.5 F (36.4 C)  TempSrc:    Oral  Resp: 13 16 16 18   Height:      Weight:      SpO2: 98% 98% 98% 98%   Body mass index is 20.23 kg/(m^2).  General: A&O x 3, WD, elderly and thin  Head: Gantt/AT, mild Temporalis wasting,   Ear/Nose/Throat: Hearing grossly intact, nares w/o erythema or drainage, oropharynx w/o Erythema/Exudate, Mallampati score: 2  Eyes: PERRLA, EOMI  Neck: Supple, no nuchal rigidity, no palpable LAD  Pulmonary: Sym exp, good air movt, CTAB, no rales, rhonchi, & wheezing  Cardiac: RRR, Nl S1, S2, no Murmurs, rubs or gallops  Vascular: Vessel Right Left  Radial Palpable Palpable  Brachial Palpable Palpable  Carotid Palpable, without bruit Palpable, with bruit  Aorta Not palpable N/A  Femoral Palpable Palpable  Popliteal Palpable Palpable  PT Palpable Not Palpable  DP Not Palpable Palpable   Gastrointestinal: soft, NTND, -G/R, - HSM, - masses, - CVAT B  Musculoskeletal: M/S 5/5 throughout , Extremities without ischemic changes   Neurologic: CN 2-12 intact , Pain and light touch intact in extremities , Motor exam as listed above  Psychiatric: Judgment intact, Mood & affect appropriate for pt's clinical situation  Dermatologic: See M/S exam for extremity exam, no rashes otherwise noted  Lymph : No Cervical, Axillary, or Inguinal lymphadenopathy   Laboratory: CBC:    Component Value Date/Time   WBC 4.5 02/24/2013 0550   RBC 3.80* 02/24/2013 0550   RBC 3.17* 02/11/2011 0450   HGB 11.4* 02/24/2013 0550   HCT 33.3*  02/24/2013 0550   PLT 124* 02/24/2013 0550   MCV 87.6 02/24/2013 0550   MCH 30.0 02/24/2013 0550   MCHC 34.2 02/24/2013 0550   RDW 14.0 02/24/2013 0550   LYMPHSABS 1.8 02/22/2013 1339   MONOABS 0.5 02/22/2013 1339   EOSABS 0.2 02/22/2013 1339   BASOSABS 0.0 02/22/2013 1339    BMP:    Component Value Date/Time   NA 140 02/24/2013 0550   K 3.9 02/24/2013 0550  CL 106 02/24/2013 0550   CO2 24 02/24/2013 0550   GLUCOSE 89 02/24/2013 0550   BUN 15 02/24/2013 0550   CREATININE 1.20 02/24/2013 0550   CALCIUM 8.5 02/24/2013 0550   GFRNONAA 53* 02/24/2013 0550   GFRAA 61* 02/24/2013 0550    Coagulation: Lab Results  Component Value Date   INR 1.08 02/24/2013   INR 1.25 02/14/2011   INR 1.26 02/11/2011   No results found for this basename: PTT   Lipid Panel     Component Value Date/Time   CHOL 167 02/24/2013 0550   TRIG 109 02/24/2013 0550   HDL 25* 02/24/2013 0550   CHOLHDL 6.7 02/24/2013 0550   VLDL 22 02/24/2013 0550   LDLCALC 120* 02/24/2013 0550    Radiology: Dg Chest 2 View  02/22/2013   CLINICAL DATA:  Extremity weakness. Current history of COPD, cardiomyopathy, hypertension, and GE reflux disease.  EXAM: CHEST  2 VIEW  COMPARISON:  CT chest 08/23/2011. Two-view chest x-ray 08/19/2011, 04/18/2011.  FINDINGS: Cardiac silhouette upper normal in size, unchanged. Thoracic aorta tortuous and atherosclerotic, unchanged. Hilar and mediastinal contours otherwise unremarkable. Scarring and bronchiectasis in the right middle lobe and to a lesser degree the lingula, as noted on the prior CT, unchanged. Stable hyperinflation and emphysematous changes throughout both lungs. Stable mild biapical pleuroparenchymal scarring. Lungs otherwise clear. No pleural effusions. Degenerative changes involving the thoracic spine.  IMPRESSION: No acute cardiopulmonary disease. Stable COPD/emphysema and scar/bronchiectasis in the right middle lobe and to a lesser degree the lingula. Stable biapical pleuroparenchymal scarring.    Electronically Signed   By: Hulan Saas   On: 02/22/2013 19:32   Mr Maxine Glenn Head Wo Contrast  02/22/2013   CLINICAL DATA:  79 year old male with weakness and headache.  EXAM: MRI HEAD WITHOUT CONTRAST  MRA HEAD WITHOUT CONTRAST  TECHNIQUE: Multiplanar, multiecho pulse sequences of the brain and surrounding structures were obtained without intravenous contrast. Angiographic images of the head were obtained using MRA technique without contrast.  COMPARISON:  Head CT 02/22/2013.  FINDINGS: Study is intermittently degraded by motion artifact despite repeated imaging attempts.  MRI HEAD FINDINGS  There is increased trace diffusion signal in the left posterior frontal lobe and parietal lobe (series 5, image 41) correspond to isointense to restricted diffusion (series 6, image 18). Associated T2 and FLAIR hyperintensity, plus superimposed fairly confluent bilateral corona radiata T2 and FLAIR increased signal. No associated mass effect or hemorrhage. Probably punctate sensory cortex involvement on series 5, image 44.  No other diffusion abnormality identified. Major intracranial vascular flow voids are preserved.  Cerebral volume is within normal limits for age. No ventriculomegaly. No midline shift, mass effect, or evidence of intracranial mass lesion. No acute intracranial hemorrhage identified. No cortical encephalomalacia identified. Deep gray matter nuclei, brainstem and cerebellum within normal limits for age.  Negative pituitary, cervicomedullary junction visualized cervical spine. Normal bone marrow signal. Visualized orbit soft tissues are within normal limits. Visualized paranasal sinuses and mastoids are clear. Negative scalp soft tissues.  MRA HEAD FINDINGS  Antegrade flow in the posterior circulation with fairly codominant distal vertebral arteries. Normal right PICA. Patent vertebrobasilar junction. Mild basilar tortuosity and irregularity without stenosis. SCA and PCA origins within normal limits.  Posterior communicating artery is diminutive or absent.  Antegrade flow in both ICA siphons with irregularity and asymmetric decreased signal of the distal left ICA siphon. Appearance raises the possibility of a hemodynamically significant stenosis in the supra clinoid segment (series 109, image 28). Subsequent lead asymmetric decreased  flow signal at the left ICA terminus, left MCA, and left ACA origins.  Negative right ICA siphon, right MCA and ACA origins. Diminutive anterior communicating artery. Visualized ACA flow signal beyond the A1 segment on the left is within normal limits. Visualized right MCA branches are within normal limits.  Decreased flow signal in the left MCA M1 segment still, the left MCA bifurcation is patent. No major left MCA branch occlusion or high-grade stenosis identified.  IMPRESSION: MRI HEAD IMPRESSION  1. Acute to subacute small infarcts in the left posterior MCA territory mostly affecting white matter. No mass effect or hemorrhage.  2. Underlying bilateral nonspecific cerebral white matter signal changes, favor chronic small vessel disease.  MRA HEAD IMPRESSION  1. Constellation of findings suggestive of hemodynamically significant stenosis in the left supra clinoid ICA segment, resulting in decreased flow at the left ICA terminus, left MCA, and left ACA origins.  2. No major left MCA branch occlusion. Left ACA flow is symmetric to that on the right be on the anterior communicating artery.  Study discussed by telephone with Dr. Thea Silversmith SHORT on 02/22/2013 at 16:34 .   Electronically Signed   By: Augusto Gamble M.D.   On: 02/22/2013 16:38   Mr Brain Wo Contrast  02/22/2013   CLINICAL DATA:  79 year old male with weakness and headache.  EXAM: MRI HEAD WITHOUT CONTRAST  MRA HEAD WITHOUT CONTRAST  TECHNIQUE: Multiplanar, multiecho pulse sequences of the brain and surrounding structures were obtained without intravenous contrast. Angiographic images of the head were obtained using MRA  technique without contrast.  COMPARISON:  Head CT 02/22/2013.  FINDINGS: Study is intermittently degraded by motion artifact despite repeated imaging attempts.  MRI HEAD FINDINGS  There is increased trace diffusion signal in the left posterior frontal lobe and parietal lobe (series 5, image 41) correspond to isointense to restricted diffusion (series 6, image 18). Associated T2 and FLAIR hyperintensity, plus superimposed fairly confluent bilateral corona radiata T2 and FLAIR increased signal. No associated mass effect or hemorrhage. Probably punctate sensory cortex involvement on series 5, image 44.  No other diffusion abnormality identified. Major intracranial vascular flow voids are preserved.  Cerebral volume is within normal limits for age. No ventriculomegaly. No midline shift, mass effect, or evidence of intracranial mass lesion. No acute intracranial hemorrhage identified. No cortical encephalomalacia identified. Deep gray matter nuclei, brainstem and cerebellum within normal limits for age.  Negative pituitary, cervicomedullary junction visualized cervical spine. Normal bone marrow signal. Visualized orbit soft tissues are within normal limits. Visualized paranasal sinuses and mastoids are clear. Negative scalp soft tissues.  MRA HEAD FINDINGS  Antegrade flow in the posterior circulation with fairly codominant distal vertebral arteries. Normal right PICA. Patent vertebrobasilar junction. Mild basilar tortuosity and irregularity without stenosis. SCA and PCA origins within normal limits. Posterior communicating artery is diminutive or absent.  Antegrade flow in both ICA siphons with irregularity and asymmetric decreased signal of the distal left ICA siphon. Appearance raises the possibility of a hemodynamically significant stenosis in the supra clinoid segment (series 109, image 28). Subsequent lead asymmetric decreased flow signal at the left ICA terminus, left MCA, and left ACA origins.  Negative right ICA  siphon, right MCA and ACA origins. Diminutive anterior communicating artery. Visualized ACA flow signal beyond the A1 segment on the left is within normal limits. Visualized right MCA branches are within normal limits.  Decreased flow signal in the left MCA M1 segment still, the left MCA bifurcation is patent. No major left MCA branch  occlusion or high-grade stenosis identified.  IMPRESSION: MRI HEAD IMPRESSION  1. Acute to subacute small infarcts in the left posterior MCA territory mostly affecting white matter. No mass effect or hemorrhage.  2. Underlying bilateral nonspecific cerebral white matter signal changes, favor chronic small vessel disease.  MRA HEAD IMPRESSION  1. Constellation of findings suggestive of hemodynamically significant stenosis in the left supra clinoid ICA segment, resulting in decreased flow at the left ICA terminus, left MCA, and left ACA origins.  2. No major left MCA branch occlusion. Left ACA flow is symmetric to that on the right be on the anterior communicating artery.  Study discussed by telephone with Dr. Thea Silversmith SHORT on 02/22/2013 at 16:34 .   Electronically Signed   By: Augusto Gamble M.D.   On: 02/22/2013 16:38   US Carotid Duplex Bilateral  02/22/2013   CLINICAL DATA:  Right arm weakness and numbness, TIA, history hypertension, coronary artery disease, non ischemic cardiomyopathy, COPD  EXAM: BILATERAL CAROTID DUPLEX ULTRASOUND  TECHNIQUE: Wallace Cullens scale imaging, color Doppler and duplex ultrasound were performed of bilateral carotid and vertebral arteries in the neck.  COMPARISON:  03/25/2004  FINDINGS: Criteria: Quantification of carotid stenosis is based on velocity parameters that correlate the residual internal carotid diameter with NASCET-based stenosis levels, using the diameter of the distal internal carotid lumen as the denominator for stenosis measurement.  The following velocity measurements were obtained:  RIGHT  ICA:  92/33 Cm/sec  CCA:  73/18 cm/sec  SYSTOLIC ICA/CCA  RATIO:  1.26  DIASTOLIC ICA/CCA RATIO:  1.82  ECA:  109 cm/sec  LEFT  ICA:  168/19 cm/sec  CCA:  65/8 cm/sec  SYSTOLIC ICA/CCA RATIO:  2.60  DIASTOLIC ICA/CCA RATIO:  2.35  ECA:  144 cm/sec  RIGHT CAROTID ARTERY: Intimal thickening and noncalcified plaque right CCA. More prominent plaques at right carotid bulb into proximal right ICA, both hypoechoic and calcified non shadowing plaques. Laminar flow on color Doppler imaging with mild spectral broadening in right ICA on waveform analysis. No high velocity jets.  RIGHT VERTEBRAL ARTERY:  Patent, antegrade  LEFT CAROTID ARTERY: Intimal thickening and noncalcified plaque left CCA. More pronounced noncalcified plaque at left carotid bulb with calcified and noncalcified plaque at the proximal left ICA. Associated wih turbulent blood flow in left ICA on color Doppler imaging and spectral broadening on waveform analysis. Increased velocity present.  LEFT VERTEBRAL ARTERY:  Patent, antegrade  IMPRESSION: Plaque formation in the carotid systems as above.  Velocities within the left ICA correspond to a 50-69% diameter stenosis.  Velocities within the right ICA correspond to a less than 50% diameter stenosis.   Electronically Signed   By: Ulyses Southward M.D.   On: 02/22/2013 17:21   I reviewed the MRI head: there is clear evidence of three foci of ischemic injury.  I also reviewed the B carotid and cranial angiograms: L ICA stenosis 80-90%. Disease appears to be at level of hyoid bone with widely patent distal internal carotid artery surgically accessible.  Medical Decision Making  JERMELL HOLEMAN is a 79 y.o. male who presents with: L side CVA, sx ICA stenosis >80%, hyperlipidemia, known history of CAD, >4 years old   Clearly this patient has already had 2 events in the last 2-3 weeks, which puts him mostly out of the first month where the highest frequency of subsequent CVA/TIA occur.  If this pt has another event, he would make the criteria for crescendo TIA/CVA and  immediate intervention would be offered.  The decision of intervention is complex in a 88 year patient.  He seems to be in medically stable condition with a reasonable level of function, by report walking one mile a day and able to complete yard work.  The patient seems inclined to proceed with intervention, so the question is which intervention.  Further pre-operative risk stratification with Cardiology will help settle the question.  CREST clearly demonstrates an increased stroke rate with CAS in patients >70 years.  Additionally, subgroup analysis demonstrates a surgical stroke rate of 1.1% when the CEA was performed by a vascular surgeon.  If his cardiac status is acceptable, I would offer L CEA, otherwise, I would defer to Dr. Corliss Skains in regards to stent placement.  The next question is the timing of the L CEA or CAS.  Due to the risk of intraoperative hypotension and subsequent expansion of the ischemic territory, I would delay the L CEA until 2 weeks to give the brain time to recover.  If CAS is elected, it usually is completed without general anesthesia so there is less risk though the risk of carotid body mediated hypotension still exists.  I discussed in depth with the patient the nature of atherosclerosis, and emphasized the importance of maximal medical management including strict control of blood pressure, blood glucose, and lipid levels, obtaining regular exercise, antiplatelet agents, and cessation of smoking.   The patient has recently be started on Zocor for his LDL >100.  The patient is currently on two anti-platelet: ASA, Plavix.    The patient is aware that without maximal medical management the underlying atherosclerotic disease process will progress, limiting the benefit of any interventions.  We will get [facility] to evaluate the patient tomorrow to help expedite his pre-procedural evaluation.  Thank you for allowing [place] to participate in this patient's  care.  Leonides Sake, MD [facility] Office: [phone] Pager: [phone]  02/24/2013, 2:28 PM

## 2013-02-24 NOTE — Procedures (Signed)
S/P 4 vessel cerebral arterogram. RT CFA approach. Findings. 1.High grade 75 to 80 % stenosis of LT ICA prox.

## 2013-02-24 NOTE — Progress Notes (Signed)
[facility] PROGRESS NOTE  CORREY WEIDNER WUJ:[id number] DOB: 02-May-1925 DOA: 02/22/2013 PCP: Catalina Pizza, MD  Assessment/Plan: Principal Problem:   TIA (transient ischemic attack) Active Problems:   Anemia   Protein-calorie malnutrition, severe   Right arm weakness   Hypertension   CAD (coronary artery disease)   CVA (cerebral infarction)   Occlusion and stenosis of carotid artery with cerebral infarction     1. Recurrent CVA: Patient presented with recurrent RUE weakness, dating from 3 weeks ago. Brain MRI revealed acute and subacute small infarcts in the left posterior MCA territory mostly in the white matter. MRA showed possibly hemodynamically significant stenosis of the left supraclinoid ICA segment resulting in decreased flow at the left ICA terminus, left MCA, and left ACA origins. Carotid duplex showed plaque formation in the carotid systems, while velocities within the left ICA correspond to a 50-69% diameter stenosis and in the right ICA, correspond to a less than 50% diameter stenosis. 2D echocardiogram showed normal LV cavity size, EF 45% and diffuse hypokinesis. Mild Mitral and Aortic valve regurgitation. LA was mildly dilated. Dr Thana Farr, provided neurology consultation, and Dr Julieanne Cotton, IR, performed 4-vessel cerebral angiogram on 02/24/13, and this confirmed high grade (75%-80%) proximal left ICA stenosis. On ASA/Plavix, per neurology. RUE weakness has markedly improved, and PT/OT have recommended HHPT/OT. Awaiting decision on possible definitive management if ICA lesion.  2. Dyslipidemia: Lipid profile showed TC 167, TG 109, HDL 25, LDL 120. Have commenced Statin. Continue Omega-3 fatty acids.  3. HTN: BP is reasonable, in the context of acute CVA. Allowing permissive hypertension.  4. History of Grade 1 diastolic heart failure: Chronic/stable. No clinical decompensation. See echocardiographic findings in #1 above.  5. BPH: Patient is s/p TURP. Not  problematic.  6. Hypothyroidism:  Continued on thyroxine replacement therapy. TSH is pending.  7. Anemia: This is mild, normocytic anemia. Iron levels, TSH, B12, folate are pending.  8. Severe protein calorie malnutrition: Patient appears clinically malnourished. Nutritionist consul obtained. Managing as recommended.        Code Status: DNR Family Communication:  Disposition Plan: To be determined.    Brief narrative: The patient is a 79 y.o. year-old male with history of hypertension, a mild nonischemic cardiomyopathy with grade 1 Diastolic Dysfunction, ECHO 55%-60% 2013, kidney stones, BPH s/p TURP, arthritis s/p Right THA, s/p Left TKA, GERD, COPD, hypothyroidism, who presents with right arm weakness. The patient was last at baseline health until several weeks ago. Approximately 3 weeks ago, the patient was mowing his yard and when he finished and got off the mower, his right arm suddenly became weak and numb from the elbow down. He difficulty lifting his arm from the elbow. Sensation last approximately 30 minutes and resolved on its own. He denied any associated facial droop, slurred speech, other numbness or weakness. He was well the next several weeks. On 02/22/13, around 12:30pm after lunch, he developed acute onset of right upper extremity weakness and numbness from the shoulder down. His wife states that his arm hung from his side and he was unable to move it without using his other arm. He denied slurred speech, facial droop, any other numbness or weakness. At baseline he does have some mild numbness of bilateral hands. He then came to the ER at [facility], [facility], [facility] for further evaluation. He denies any recent fevers, chills, cough, dysuria, focal signs of infection. He denies any history of seizures. He had a distant injury to his right arm when he fell on it  earlier this year, that at that time he just had a mild abrasion and no other injury. He also had a right ulnar nerve injury which resulted in him  undergoing a surgery for nerve release, at which did not help much with the numbness of the lateral two fingers. In the ER, his labs were notable for an mild anemia with a hemoglobin of 12.9, otherwise unremarkable. Head CT scan demonstrated no acute intracranial abnormality, moderate atrophy and white matter disease with atherosclerosis. EKG demonstrated normal sinus rhythm, with suggestion of Q waves in V1 and V2 and indicate a previous septal infarct.  MRI/MRA demonstrated acute and subacute small infarcts in the left posterior MCA territory mostly in the white matter and possibly hemodynamically significant stenosis of the left supraclinoid ICA segment resulting in decreased flow at the left ICA terminus, left MCA, and left ACA origins. There was no major MCA branch occlusion and left ACA flow was symmetric to the right. Patient was transferred to [facility], after consultation with neurologist, for further evaluation and management.   Consultants:  Dr Thana Farr, Neurology.  Dr Julieanne Cotton, IR.   Procedures:  Head CT Scan.  Brain MRI/MRA.   Antibiotics:  N/A.   HPI/Subjective: No new issues. RUE power has improved.   Objective: Vital signs in last 24 hours: Temp:  [97.6 F (36.4 C)-98.1 F (36.7 C)] 97.8 F (36.6 C) (09/28 0600) Pulse Rate:  [49-61] 60 (09/28 0845) Resp:  [12-18] 16 (09/28 0845) BP: (136-197)/(65-98) 151/80 mmHg (09/28 0845) SpO2:  [94 %-98 %] 98 % (09/28 0845) Weight change:  Last BM Date: 02/22/13  Intake/Output from previous day:       Physical Exam: General: Comfortable, alert, communicative, fully oriented, not short of breath at rest. Appears underweight.  HEENT:  Mild clinical pallor, no jaundice, no conjunctival injection or discharge. NECK:  Supple, JVP not seen, no carotid bruits, no palpable lymphadenopathy, no palpable goiter. CHEST:  Clinically clear to auscultation, no wheezes, no crackles. HEART:  Sounds 1 and 2 heard, normal,  regular, no murmurs. ABDOMEN:  Flat, soft, non-tender, no palpable organomegaly, no palpable masses, normal bowel sounds. GENITALIA:  Not examined. LOWER EXTREMITIES:  No pitting edema, palpable peripheral pulses. MUSCULOSKELETAL SYSTEM:  Generalized osteoarthritic changes, otherwise, normal. CENTRAL NERVOUS SYSTEM:  Markedly improved RUE weakness. No other focal neurologic deficit on gross examination.  Lab Results:  Recent Labs  02/22/13 1339 02/24/13 0550  WBC 6.0 4.5  HGB 12.9* 11.4*  HCT 38.2* 33.3*  PLT 156 124*    Recent Labs  02/22/13 1339 02/24/13 0550  NA 140 140  K 3.6 3.9  CL 99 106  CO2 31 24  GLUCOSE 92 89  BUN 13 15  CREATININE 1.09 1.20  CALCIUM 9.9 8.5   No results found for this or any previous visit (from the past 240 hour(s)).   Studies/Results: Dg Chest 2 View  02/22/2013   CLINICAL DATA:  Extremity weakness. Current history of COPD, cardiomyopathy, hypertension, and GE reflux disease.  EXAM: CHEST  2 VIEW  COMPARISON:  CT chest 08/23/2011. Two-view chest x-ray 08/19/2011, 04/18/2011.  FINDINGS: Cardiac silhouette upper normal in size, unchanged. Thoracic aorta tortuous and atherosclerotic, unchanged. Hilar and mediastinal contours otherwise unremarkable. Scarring and bronchiectasis in the right middle lobe and to a lesser degree the lingula, as noted on the prior CT, unchanged. Stable hyperinflation and emphysematous changes throughout both lungs. Stable mild biapical pleuroparenchymal scarring. Lungs otherwise clear. No pleural effusions. Degenerative changes involving the thoracic spine.  IMPRESSION: No acute cardiopulmonary disease. Stable COPD/emphysema and scar/bronchiectasis in the right middle lobe and to a lesser degree the lingula. Stable biapical pleuroparenchymal scarring.   Electronically Signed   By: Hulan Saas   On: 02/22/2013 19:32   Ct Head Wo Contrast  02/22/2013   CLINICAL DATA:  Right arm weakness. Numbness.  EXAM: CT HEAD WITHOUT  CONTRAST  TECHNIQUE: Contiguous axial images were obtained from the base of the skull through the vertex without intravenous contrast.  COMPARISON:  And branch the reported CT head without contrast [facility]/09/[facility]. Images are no longer available.  FINDINGS: Mild generalized atrophy and white matter disease is present bilaterally. No acute cortical infarct, hemorrhage, or mass lesion is present. The ventricles are proportionate to the degree of atrophy. No significant extra-axial fluid collection is present.  The paranasal sinuses and mastoid air cells are clear. The osseous skull is intact. Atherosclerotic calcifications are present within the cavernous carotid arteries and at the dural margin of the vertebral arteries.  IMPRESSION: 1. No acute intracranial abnormality. 2. Moderate atrophy and white matter disease likely reflects the sequela of chronic microvascular ischemia. 3. Atherosclerosis.   Electronically Signed   By: Gennette Pac   On: 02/22/2013 14:23   Mr Maxine Glenn Head Wo Contrast  02/22/2013   CLINICAL DATA:  79 year old male with weakness and headache.  EXAM: MRI HEAD WITHOUT CONTRAST  MRA HEAD WITHOUT CONTRAST  TECHNIQUE: Multiplanar, multiecho pulse sequences of the brain and surrounding structures were obtained without intravenous contrast. Angiographic images of the head were obtained using MRA technique without contrast.  COMPARISON:  Head CT 02/22/2013.  FINDINGS: Study is intermittently degraded by motion artifact despite repeated imaging attempts.  MRI HEAD FINDINGS  There is increased trace diffusion signal in the left posterior frontal lobe and parietal lobe (series 5, image 41) correspond to isointense to restricted diffusion (series 6, image 18). Associated T2 and FLAIR hyperintensity, plus superimposed fairly confluent bilateral corona radiata T2 and FLAIR increased signal. No associated mass effect or hemorrhage. Probably punctate sensory cortex involvement on series 5, image  44.  No other diffusion abnormality identified. Major intracranial vascular flow voids are preserved.  Cerebral volume is within normal limits for age. No ventriculomegaly. No midline shift, mass effect, or evidence of intracranial mass lesion. No acute intracranial hemorrhage identified. No cortical encephalomalacia identified. Deep gray matter nuclei, brainstem and cerebellum within normal limits for age.  Negative pituitary, cervicomedullary junction visualized cervical spine. Normal bone marrow signal. Visualized orbit soft tissues are within normal limits. Visualized paranasal sinuses and mastoids are clear. Negative scalp soft tissues.  MRA HEAD FINDINGS  Antegrade flow in the posterior circulation with fairly codominant distal vertebral arteries. Normal right PICA. Patent vertebrobasilar junction. Mild basilar tortuosity and irregularity without stenosis. SCA and PCA origins within normal limits. Posterior communicating artery is diminutive or absent.  Antegrade flow in both ICA siphons with irregularity and asymmetric decreased signal of the distal left ICA siphon. Appearance raises the possibility of a hemodynamically significant stenosis in the supra clinoid segment (series 109, image 28). Subsequent lead asymmetric decreased flow signal at the left ICA terminus, left MCA, and left ACA origins.  Negative right ICA siphon, right MCA and ACA origins. Diminutive anterior communicating artery. Visualized ACA flow signal beyond the A1 segment on the left is within normal limits. Visualized right MCA branches are within normal limits.  Decreased flow signal in the left MCA M1 segment still, the left MCA bifurcation is patent. No  major left MCA branch occlusion or high-grade stenosis identified.  IMPRESSION: MRI HEAD IMPRESSION  1. Acute to subacute small infarcts in the left posterior MCA territory mostly affecting white matter. No mass effect or hemorrhage.  2. Underlying bilateral nonspecific cerebral white  matter signal changes, favor chronic small vessel disease.  MRA HEAD IMPRESSION  1. Constellation of findings suggestive of hemodynamically significant stenosis in the left supra clinoid ICA segment, resulting in decreased flow at the left ICA terminus, left MCA, and left ACA origins.  2. No major left MCA branch occlusion. Left ACA flow is symmetric to that on the right be on the anterior communicating artery.  Study discussed by telephone with Dr. Thea Silversmith SHORT on 02/22/2013 at 16:34 .   Electronically Signed   By: Augusto Gamble M.D.   On: 02/22/2013 16:38   Mr Brain Wo Contrast  02/22/2013   CLINICAL DATA:  79 year old male with weakness and headache.  EXAM: MRI HEAD WITHOUT CONTRAST  MRA HEAD WITHOUT CONTRAST  TECHNIQUE: Multiplanar, multiecho pulse sequences of the brain and surrounding structures were obtained without intravenous contrast. Angiographic images of the head were obtained using MRA technique without contrast.  COMPARISON:  Head CT 02/22/2013.  FINDINGS: Study is intermittently degraded by motion artifact despite repeated imaging attempts.  MRI HEAD FINDINGS  There is increased trace diffusion signal in the left posterior frontal lobe and parietal lobe (series 5, image 41) correspond to isointense to restricted diffusion (series 6, image 18). Associated T2 and FLAIR hyperintensity, plus superimposed fairly confluent bilateral corona radiata T2 and FLAIR increased signal. No associated mass effect or hemorrhage. Probably punctate sensory cortex involvement on series 5, image 44.  No other diffusion abnormality identified. Major intracranial vascular flow voids are preserved.  Cerebral volume is within normal limits for age. No ventriculomegaly. No midline shift, mass effect, or evidence of intracranial mass lesion. No acute intracranial hemorrhage identified. No cortical encephalomalacia identified. Deep gray matter nuclei, brainstem and cerebellum within normal limits for age.  Negative pituitary,  cervicomedullary junction visualized cervical spine. Normal bone marrow signal. Visualized orbit soft tissues are within normal limits. Visualized paranasal sinuses and mastoids are clear. Negative scalp soft tissues.  MRA HEAD FINDINGS  Antegrade flow in the posterior circulation with fairly codominant distal vertebral arteries. Normal right PICA. Patent vertebrobasilar junction. Mild basilar tortuosity and irregularity without stenosis. SCA and PCA origins within normal limits. Posterior communicating artery is diminutive or absent.  Antegrade flow in both ICA siphons with irregularity and asymmetric decreased signal of the distal left ICA siphon. Appearance raises the possibility of a hemodynamically significant stenosis in the supra clinoid segment (series 109, image 28). Subsequent lead asymmetric decreased flow signal at the left ICA terminus, left MCA, and left ACA origins.  Negative right ICA siphon, right MCA and ACA origins. Diminutive anterior communicating artery. Visualized ACA flow signal beyond the A1 segment on the left is within normal limits. Visualized right MCA branches are within normal limits.  Decreased flow signal in the left MCA M1 segment still, the left MCA bifurcation is patent. No major left MCA branch occlusion or high-grade stenosis identified.  IMPRESSION: MRI HEAD IMPRESSION  1. Acute to subacute small infarcts in the left posterior MCA territory mostly affecting white matter. No mass effect or hemorrhage.  2. Underlying bilateral nonspecific cerebral white matter signal changes, favor chronic small vessel disease.  MRA HEAD IMPRESSION  1. Constellation of findings suggestive of hemodynamically significant stenosis in the left supra clinoid ICA segment,  resulting in decreased flow at the left ICA terminus, left MCA, and left ACA origins.  2. No major left MCA branch occlusion. Left ACA flow is symmetric to that on the right be on the anterior communicating artery.  Study discussed by  telephone with Dr. Thea Silversmith SHORT on 02/22/2013 at 16:34 .   Electronically Signed   By: Augusto Gamble M.D.   On: 02/22/2013 16:38   US Carotid Duplex Bilateral  02/22/2013   CLINICAL DATA:  Right arm weakness and numbness, TIA, history hypertension, coronary artery disease, non ischemic cardiomyopathy, COPD  EXAM: BILATERAL CAROTID DUPLEX ULTRASOUND  TECHNIQUE: Wallace Cullens scale imaging, color Doppler and duplex ultrasound were performed of bilateral carotid and vertebral arteries in the neck.  COMPARISON:  03/25/2004  FINDINGS: Criteria: Quantification of carotid stenosis is based on velocity parameters that correlate the residual internal carotid diameter with NASCET-based stenosis levels, using the diameter of the distal internal carotid lumen as the denominator for stenosis measurement.  The following velocity measurements were obtained:  RIGHT  ICA:  92/33 Cm/sec  CCA:  73/18 cm/sec  SYSTOLIC ICA/CCA RATIO:  1.26  DIASTOLIC ICA/CCA RATIO:  1.82  ECA:  109 cm/sec  LEFT  ICA:  168/19 cm/sec  CCA:  65/8 cm/sec  SYSTOLIC ICA/CCA RATIO:  2.60  DIASTOLIC ICA/CCA RATIO:  2.35  ECA:  144 cm/sec  RIGHT CAROTID ARTERY: Intimal thickening and noncalcified plaque right CCA. More prominent plaques at right carotid bulb into proximal right ICA, both hypoechoic and calcified non shadowing plaques. Laminar flow on color Doppler imaging with mild spectral broadening in right ICA on waveform analysis. No high velocity jets.  RIGHT VERTEBRAL ARTERY:  Patent, antegrade  LEFT CAROTID ARTERY: Intimal thickening and noncalcified plaque left CCA. More pronounced noncalcified plaque at left carotid bulb with calcified and noncalcified plaque at the proximal left ICA. Associated wih turbulent blood flow in left ICA on color Doppler imaging and spectral broadening on waveform analysis. Increased velocity present.  LEFT VERTEBRAL ARTERY:  Patent, antegrade  IMPRESSION: Plaque formation in the carotid systems as above.  Velocities within the left  ICA correspond to a 50-69% diameter stenosis.  Velocities within the right ICA correspond to a less than 50% diameter stenosis.   Electronically Signed   By: Ulyses Southward M.D.   On: 02/22/2013 17:21    Medications: Scheduled Meds: . aspirin  300 mg Rectal Daily   Or  . aspirin  325 mg Oral Daily  . clopidogrel  75 mg Oral Q breakfast  . enoxaparin (LOVENOX) injection  40 mg Subcutaneous Q24H  . feeding supplement  237 mL Oral BID BM  . levothyroxine  50 mcg Oral QAC breakfast  . omega-3 acid ethyl esters  1 g Oral Daily   Continuous Infusions: . sodium chloride    . sodium chloride 50 mL/hr at 02/24/13 0356  . sodium chloride     PRN Meds:.acetaminophen, acetaminophen, ondansetron (ZOFRAN) IV, senna-docusate    LOS: 2 days   [place] Festa,CHRISTOPHER  [facility] Pager [phone]. If 8PM-8AM, please contact night-coverage at [email or web address], password [facility] 02/24/2013, 9:59 AM  LOS: 2 days

## 2013-02-24 NOTE — Progress Notes (Signed)
OT Cancellation Note  Patient Details Name: JASHAUN PENROSE MRN: [id number] DOB: [birth year]   Cancelled Treatment:    Reason Eval/Treat Not Completed: Other (comment)  Pt refusing to work with therapy and stated he wanted to rest and didn't need therapy.   Earlie Raveling OTR/L [phone] 02/24/2013, 12:18 PM

## 2013-02-25 ENCOUNTER — Encounter (HOSPITAL_COMMUNITY): Payer: Self-pay | Admitting: Physician Assistant

## 2013-02-25 DIAGNOSIS — I63239 Cerebral infarction due to unspecified occlusion or stenosis of unspecified carotid arteries: Secondary | ICD-10-CM | POA: Diagnosis not present

## 2013-02-25 DIAGNOSIS — Z01810 Encounter for preprocedural cardiovascular examination: Secondary | ICD-10-CM

## 2013-02-25 DIAGNOSIS — G459 Transient cerebral ischemic attack, unspecified: Secondary | ICD-10-CM | POA: Diagnosis not present

## 2013-02-25 DIAGNOSIS — I2510 Atherosclerotic heart disease of native coronary artery without angina pectoris: Secondary | ICD-10-CM | POA: Diagnosis not present

## 2013-02-25 DIAGNOSIS — I10 Essential (primary) hypertension: Secondary | ICD-10-CM | POA: Diagnosis not present

## 2013-02-25 DIAGNOSIS — I6350 Cerebral infarction due to unspecified occlusion or stenosis of unspecified cerebral artery: Secondary | ICD-10-CM | POA: Diagnosis not present

## 2013-02-25 LAB — BASIC METABOLIC PANEL
BUN: 16 mg/dL (ref 6–23)
CO2: 24 mEq/L (ref 19–32)
Calcium: 9.1 mg/dL (ref 8.4–10.5)
Glucose, Bld: 106 mg/dL — ABNORMAL HIGH (ref 70–99)
Potassium: 3.8 mEq/L (ref 3.5–5.1)
Sodium: 138 mEq/L (ref 135–145)

## 2013-02-25 LAB — CBC
HCT: 34.5 % — ABNORMAL LOW (ref 39.0–52.0)
Hemoglobin: 11.8 g/dL — ABNORMAL LOW (ref 13.0–17.0)
MCH: 29.7 pg (ref 26.0–34.0)
MCHC: 34.2 g/dL (ref 30.0–36.0)
RBC: 3.97 MIL/uL — ABNORMAL LOW (ref 4.22–5.81)
WBC: 4.7 10*3/uL (ref 4.0–10.5)

## 2013-02-25 MED ORDER — CLOPIDOGREL BISULFATE 75 MG PO TABS: 75.0000 mg | ORAL_TABLET | Freq: Every day | ORAL | Status: DC

## 2013-02-25 MED ORDER — ENSURE COMPLETE PO LIQD: 237.0000 mL | Freq: Two times a day (BID) | ORAL | Status: DC

## 2013-02-25 MED ORDER — ASPIRIN 325 MG PO TABS: 325.0000 mg | ORAL_TABLET | Freq: Every day | ORAL | Status: DC

## 2013-02-25 MED ORDER — SIMVASTATIN 20 MG PO TABS: 20.0000 mg | ORAL_TABLET | Freq: Every day | ORAL | Status: DC

## 2013-02-25 NOTE — Evaluation (Signed)
Speech Language Pathology Evaluation Patient Details Name: Jorge Nixon MRN: [id number] DOB: December 30, 1924 Today's Date: 02/25/2013 Time: 7829-5621 SLP Time Calculation (min): 25 min  Problem List:  Patient Active Problem List   Diagnosis Date Noted  . CVA (cerebral infarction) 02/23/2013  . Occlusion and stenosis of carotid artery with cerebral infarction 02/23/2013  . TIA (transient ischemic attack) 02/22/2013  . Right arm weakness 02/22/2013  . Hypertension 02/22/2013  . CAD (coronary artery disease) 02/22/2013  . Esophageal dysmotility 02/24/2011  . Esophageal web 02/24/2011  . Mouth pain 02/24/2011  . Acute renal failure 02/16/2011  . Hypokalemia 02/13/2011  . Protein-calorie malnutrition, severe 02/13/2011  . PNA (pneumonia) 02/10/2011  . Dysphagia 02/10/2011  . Anemia 02/10/2011  . Weakness generalized 02/10/2011  . S/P hip replacement right 02/10/2011   Past Medical History:  Past Medical History  Diagnosis Date  . Hypertension   . Diverticular disease 02/14/2011    problems swallowing  . GERD (gastroesophageal reflux disease)   . Coronary artery disease   . Kidney stone   . Hypothyroidism   . Pneumonia   . Anemia   . COPD (chronic obstructive pulmonary disease)   . Non-ischemic cardiomyopathy     mild  EF on echo 05/03/2012 55 to 60%  . Nephrolithiasis     lithotripsy 2006  . BPH (benign prostatic hyperplasia)    Past Surgical History:  Past Surgical History  Procedure Laterality Date  . Total hip arthroplasty Right august 6th 2012    right side   . Replacement total knee Left 2006    left knee  . Transurethral resection of prostate  1996  . Knee arthroscopy Right 2010   HPI:  79 yo male with hx of HTN/CAD, right UE weakness and MRI/MRA head revealing acute to subacute small infarcts in left post MCA territory, left supraclinoid ICA stenosis with diminished flow to the left ICA terminus, left MCA and left ACA origins. Pt for speech, language evaluation.   Pt lives with his wife, is retired since 1993, was in the service and manages bill paying at home.     Assessment / Plan / Recommendation Clinical Impression  Pt with functional cognitive linguistic skills with intact semantics, syntax and use.  Pt without focal CN deficit, able to state need to get assistance to get to RR, complete functional daily math problems and is oriented.  He wrote a few sentences re: meeting his spouse with baseline writing ability noted.  Pt, spouse and step daughter report pt to be at baseline function.    Pt does not SLP intervention.  Spouse manages pt's pills by placing them in Sun[employer] pill container and pt takes them independently.  Given he manages bill paying at home per spouse/pt, advised him and his wife to assure those responsibilities are being managed adequately.      SLP Assessment  Patient does not need any further Speech Lanaguage Pathology Services    Follow Up Recommendations  None    Frequency and Duration   n/a     Pertinent Vitals/Pain Afebrile, decreased   SLP Goals   n/a  SLP Evaluation Prior Functioning  Type of Home: House Available Help at Discharge: Family;Available 24 hours/day Education: 12th grade and 2 years automotive school Vocation: Retired   IT consultant  Overall Cognitive Status: Within Functional Limits for tasks assessed Arousal/Alertness: Awake/alert Attention: Sustained;Selective Sustained Attention: Appears intact Selective Attention: Appears intact Memory: Appears intact (recalled testing he had undergone) Awareness: Appears intact Problem Solving: Appears  intact Safety/Judgment: Appears intact    Comprehension  Auditory Comprehension Overall Auditory Comprehension: Appears within functional limits for tasks assessed Yes/No Questions: Within Functional Limits Commands: Within Functional Limits Conversation: Complex Reading Comprehension Reading Status: Within funtional limits (paragraph level material,  inference impaired but + comprehen)    Expression Expression Primary Mode of Expression: Verbal Verbal Expression Overall Verbal Expression: Appears within functional limits for tasks assessed Initiation: No impairment Level of Generative/Spontaneous Verbalization: Conversation Repetition: No impairment Naming: No impairment Pragmatics: No impairment Written Expression Dominant Hand: Right Written Expression: Within Functional Limits   Oral / Motor Oral Motor/Sensory Function Overall Oral Motor/Sensory Function: Appears within functional limits for tasks assessed Motor Speech Overall Motor Speech: Appears within functional limits for tasks assessed Respiration: Within functional limits Resonance: Within functional limits Articulation: Within functional limitis Intelligibility: Intelligible Motor Planning: Witnin functional limits   GO     Mills Koller, MS [facility] SLP [phone]

## 2013-02-25 NOTE — Discharge Summary (Signed)
Physician Discharge Summary  Jorge Nixon NWG:[id number] DOB: 02-21-1925 DOA: 02/22/2013  PCP: Catalina Pizza, MD  Admit date: 02/22/2013 Discharge date: 02/25/2013  Time spent: 40 minutes  Recommendations for Outpatient Follow-up:  1. Follow up with primary MD. 2. Follow up with Dr Leonides Sake, vascular surgeon, in 1-2 weeks. 3. Follow up with Dr Delia Heady, neurologist, in 2 months.   Discharge Diagnoses:  Principal Problem:   TIA (transient ischemic attack) Active Problems:   Anemia   Protein-calorie malnutrition, severe   Right arm weakness   Hypertension   CAD (coronary artery disease)   CVA (cerebral infarction)   Occlusion and stenosis of carotid artery with cerebral infarction   Discharge Condition: Satisfactory.   Diet recommendation: Heart-Healthy.   Filed Weights   02/22/13 1322 02/22/13 1753  Weight: 65.772 kg (145 lb) 65.772 kg (145 lb)    History of present illness:  The patient is a 79 y.o. year-old male with history of hypertension, a mild nonischemic cardiomyopathy with grade 1 Diastolic Dysfunction, ECHO 55%-60% 2013, kidney stones, BPH s/p TURP, arthritis s/p Right THA, s/p Left TKA, GERD, COPD, hypothyroidism, who presents with right arm weakness. The patient was last at baseline health until several weeks ago. Approximately 3 weeks ago, the patient was mowing his yard and when he finished and got off the mower, his right arm suddenly became weak and numb from the elbow down. He difficulty lifting his arm from the elbow. Sensation last approximately 30 minutes and resolved on its own. He denied any associated facial droop, slurred speech, other numbness or weakness. He was well the next several weeks. On 02/22/13, around 12:30pm after lunch, he developed acute onset of right upper extremity weakness and numbness from the shoulder down. His wife states that his arm hung from his side and he was unable to move it without using his other arm. He denied slurred speech,  facial droop, any other numbness or weakness. At baseline he does have some mild numbness of bilateral hands. He then came to the ER at [facility] for further evaluation. He denies any recent fevers, chills, cough, dysuria, focal signs of infection. He denies any history of seizures. He had a distant injury to his right arm when he fell on it earlier this year, that at that time he just had a mild abrasion and no other injury. He also had a right ulnar nerve injury which resulted in him undergoing a surgery for nerve release, at which did not help much with the numbness of the lateral two fingers. In the ER, his labs were notable for an mild anemia with a hemoglobin of 12.9, otherwise unremarkable. Head CT scan demonstrated no acute intracranial abnormality, moderate atrophy and white matter disease with atherosclerosis. EKG demonstrated normal sinus rhythm, with suggestion of Q waves in V1 and V2 and indicate a previous septal infarct.  MRI/MRA demonstrated acute and subacute small infarcts in the left posterior MCA territory mostly in the white matter and possibly hemodynamically significant stenosis of the left supraclinoid ICA segment resulting in decreased flow at the left ICA terminus, left MCA, and left ACA origins. There was no major MCA branch occlusion and left ACA flow was symmetric to the right. Patient was transferred to [facility], after consultation with neurologist, for further evaluation and management.   Hospital Course:  1. Recurrent CVA: Patient presented with recurrent RUE weakness, dating from 3 weeks ago. Brain MRI revealed acute and subacute small infarcts in the left posterior MCA territory mostly  in the white matter. MRA showed possibly hemodynamically significant stenosis of the left supraclinoid ICA segment resulting in decreased flow at the left ICA terminus, left MCA, and left ACA origins. Carotid duplex showed plaque formation in the carotid systems, while velocities within the left ICA  correspond to a 50-69% diameter stenosis and in the right ICA, correspond to a less than 50% diameter stenosis. 2D echocardiogram showed normal LV cavity size, EF 45% and diffuse hypokinesis. Mild Mitral and Aortic valve regurgitation. LA was mildly dilated. Dr Thana Farr, provided neurology consultation, and Dr Julieanne Cotton, IR, performed 4-vessel cerebral angiogram on 02/24/13, and this confirmed high grade (75%-80%) proximal left ICA stenosis. On ASA/Plavix, per neurology. RUE weakness markedly improved, during hospitalization, and per PT, no further home PT needs are identified.  Dr Leonides Sake provided vascular surgical consultation, and offered Lt CEA, otherwise, provided cardiology clearance was obtained for procedure. Dr Willa Rough provided cardiology consultation, and has cleared patient, without need for further work up. Surgery is scheduled in about 2 weeks, to give the brain time to recover. Patient and family are agreeable to this plan. Patient was considered clinically stable for for discharge on 02/25/13.  2. Dyslipidemia: Lipid profile showed TC 167, TG 109, HDL 25, LDL 120. Have commenced Statin. Continue Omega-3 fatty acids.  3. HTN: BP is reasonable, in the context of acute CVA. Allowing permissive hypertension.  4. History of Grade 1 diastolic heart failure: Chronic/stable. No clinical decompensation. See echocardiographic findings in #1 above.  5. BPH: Patient is s/p TURP. Not problematic.  6. Hypothyroidism: Continued on thyroxine replacement therapy. TSH is pending.  7. Anemia: This is mild, normocytic anemia.  8. Severe protein calorie malnutrition: Patient appears clinically malnourished. Nutritionist consult was obtained, and patient was managed as recommended.    Procedures:  See Below.   Consultations: Dr Thana Farr, Neurology.  Dr Julieanne Cotton, IR.  Dr Leonides Sake, Vascular surgeon. Dr Willa Rough, cardiologist.   Discharge Exam: Filed Vitals:    02/25/13 1351  BP: 110/68  Pulse: 67  Temp: 97.3 F (36.3 C)  Resp: 18    General: Comfortable, alert, communicative, fully oriented, not short of breath at rest. Appears underweight.  HEENT: Mild clinical pallor, no jaundice, no conjunctival injection or discharge.  NECK: Supple, JVP not seen, no carotid bruits, no palpable lymphadenopathy, no palpable goiter.  CHEST: Clinically clear to auscultation, no wheezes, no crackles.  HEART: Sounds 1 and 2 heard, normal, regular, no murmurs.  ABDOMEN: Flat, soft, non-tender, no palpable organomegaly, no palpable masses, normal bowel sounds.  GENITALIA: Not examined.  LOWER EXTREMITIES: No pitting edema, palpable peripheral pulses.  MUSCULOSKELETAL SYSTEM: Generalized osteoarthritic changes, otherwise, normal.  CENTRAL NERVOUS SYSTEM: Markedly improved RUE weakness. No other focal neurologic deficit on gross examination.  Discharge Instructions      Discharge Orders   Future Orders Complete By Expires   Diet - low sodium heart healthy  As directed    Increase activity slowly  As directed        Medication List    STOP taking these medications       aspirin EC 81 MG tablet  Replaced by:  aspirin 325 MG tablet     losartan-hydrochlorothiazide 50-12.5 MG per tablet  Commonly known as:  HYZAAR      TAKE these medications       acetaminophen 500 MG tablet  Commonly known as:  TYLENOL  Take 500 mg by mouth every 6 (six) hours as needed.  aspirin 325 MG tablet  Take 1 tablet (325 mg total) by mouth daily.     clopidogrel 75 MG tablet  Commonly known as:  PLAVIX  Take 1 tablet (75 mg total) by mouth daily with breakfast.     feeding supplement Liqd  Take 237 mLs by mouth 2 (two) times daily between meals.     fish oil-omega-3 fatty acids 1000 MG capsule  Take 1 g by mouth daily. Takes one capsule daily     levothyroxine 50 MCG tablet  Commonly known as:  SYNTHROID, LEVOTHROID  Take 50 mcg by mouth daily before  breakfast.     simvastatin 20 MG tablet  Commonly known as:  ZOCOR  Take 1 tablet (20 mg total) by mouth daily at 6 PM.       Allergies  Allergen Reactions  . Sulfa Antibiotics Rash   Follow-up Information   Call Catalina Pizza, MD.   Specialty:  Internal Medicine   Contact information:    [address]  [place] [address] [phone]       Call Nilda Simmer, MD.   Specialty:  Vascular Surgery   Contact information:   [address] [place] [address] [phone]       Follow up with Gates Rigg, MD. Schedule an appointment as soon as possible for a visit in 2 months.   Specialties:  Neurology, Radiology   Contact information:   [address] [place] [address] [phone]        The results of significant diagnostics from this hospitalization (including imaging, microbiology, ancillary and laboratory) are listed below for reference.    Significant Diagnostic Studies: Dg Chest 2 View  02/22/2013   CLINICAL DATA:  Extremity weakness. Current history of COPD, cardiomyopathy, hypertension, and GE reflux disease.  EXAM: CHEST  2 VIEW  COMPARISON:  CT chest 08/23/2011. Two-view chest x-ray 08/19/2011, 04/18/2011.  FINDINGS: Cardiac silhouette upper normal in size, unchanged. Thoracic aorta tortuous and atherosclerotic, unchanged. Hilar and mediastinal contours otherwise unremarkable. Scarring and bronchiectasis in the right middle lobe and to a lesser degree the lingula, as noted on the prior CT, unchanged. Stable hyperinflation and emphysematous changes throughout both lungs. Stable mild biapical pleuroparenchymal scarring. Lungs otherwise clear. No pleural effusions. Degenerative changes involving the thoracic spine.  IMPRESSION: No acute cardiopulmonary disease. Stable COPD/emphysema and scar/bronchiectasis in the right middle lobe and to a lesser degree the lingula. Stable biapical pleuroparenchymal scarring.   Electronically Signed   By:  Hulan Saas   On: 02/22/2013 19:32   Ct Head Wo Contrast  02/22/2013   CLINICAL DATA:  Right arm weakness. Numbness.  EXAM: CT HEAD WITHOUT CONTRAST  TECHNIQUE: Contiguous axial images were obtained from the base of the skull through the vertex without intravenous contrast.  COMPARISON:  And branch the reported CT head without contrast [facility]. Images are no longer available.  FINDINGS: Mild generalized atrophy and white matter disease is present bilaterally. No acute cortical infarct, hemorrhage, or mass lesion is present. The ventricles are proportionate to the degree of atrophy. No significant extra-axial fluid collection is present.  The paranasal sinuses and mastoid air cells are clear. The osseous skull is intact. Atherosclerotic calcifications are present within the cavernous carotid arteries and at the dural margin of the vertebral arteries.  IMPRESSION: 1. No acute intracranial abnormality. 2. Moderate atrophy and white matter disease likely reflects the sequela of chronic microvascular ischemia. 3. Atherosclerosis.   Electronically Signed  By: Gennette Pac   On: 02/22/2013 14:23   Mr Maxine Glenn Head Wo Contrast  02/22/2013   CLINICAL DATA:  79 year old male with weakness and headache.  EXAM: MRI HEAD WITHOUT CONTRAST  MRA HEAD WITHOUT CONTRAST  TECHNIQUE: Multiplanar, multiecho pulse sequences of the brain and surrounding structures were obtained without intravenous contrast. Angiographic images of the head were obtained using MRA technique without contrast.  COMPARISON:  Head CT 02/22/2013.  FINDINGS: Study is intermittently degraded by motion artifact despite repeated imaging attempts.  MRI HEAD FINDINGS  There is increased trace diffusion signal in the left posterior frontal lobe and parietal lobe (series 5, image 41) correspond to isointense to restricted diffusion (series 6, image 18). Associated T2 and FLAIR hyperintensity, plus superimposed fairly confluent bilateral  corona radiata T2 and FLAIR increased signal. No associated mass effect or hemorrhage. Probably punctate sensory cortex involvement on series 5, image 44.  No other diffusion abnormality identified. Major intracranial vascular flow voids are preserved.  Cerebral volume is within normal limits for age. No ventriculomegaly. No midline shift, mass effect, or evidence of intracranial mass lesion. No acute intracranial hemorrhage identified. No cortical encephalomalacia identified. Deep gray matter nuclei, brainstem and cerebellum within normal limits for age.  Negative pituitary, cervicomedullary junction visualized cervical spine. Normal bone marrow signal. Visualized orbit soft tissues are within normal limits. Visualized paranasal sinuses and mastoids are clear. Negative scalp soft tissues.  MRA HEAD FINDINGS  Antegrade flow in the posterior circulation with fairly codominant distal vertebral arteries. Normal right PICA. Patent vertebrobasilar junction. Mild basilar tortuosity and irregularity without stenosis. SCA and PCA origins within normal limits. Posterior communicating artery is diminutive or absent.  Antegrade flow in both ICA siphons with irregularity and asymmetric decreased signal of the distal left ICA siphon. Appearance raises the possibility of a hemodynamically significant stenosis in the supra clinoid segment (series 109, image 28). Subsequent lead asymmetric decreased flow signal at the left ICA terminus, left MCA, and left ACA origins.  Negative right ICA siphon, right MCA and ACA origins. Diminutive anterior communicating artery. Visualized ACA flow signal beyond the A1 segment on the left is within normal limits. Visualized right MCA branches are within normal limits.  Decreased flow signal in the left MCA M1 segment still, the left MCA bifurcation is patent. No major left MCA branch occlusion or high-grade stenosis identified.  IMPRESSION: MRI HEAD IMPRESSION  1. Acute to subacute small infarcts  in the left posterior MCA territory mostly affecting white matter. No mass effect or hemorrhage.  2. Underlying bilateral nonspecific cerebral white matter signal changes, favor chronic small vessel disease.  MRA HEAD IMPRESSION  1. Constellation of findings suggestive of hemodynamically significant stenosis in the left supra clinoid ICA segment, resulting in decreased flow at the left ICA terminus, left MCA, and left ACA origins.  2. No major left MCA branch occlusion. Left ACA flow is symmetric to that on the right be on the anterior communicating artery.  Study discussed by telephone with Dr. Thea Silversmith SHORT on 02/22/2013 at 16:34 .   Electronically Signed   By: Augusto Gamble M.D.   On: 02/22/2013 16:38   Mr Brain Wo Contrast  02/22/2013   CLINICAL DATA:  79 year old male with weakness and headache.  EXAM: MRI HEAD WITHOUT CONTRAST  MRA HEAD WITHOUT CONTRAST  TECHNIQUE: Multiplanar, multiecho pulse sequences of the brain and surrounding structures were obtained without intravenous contrast. Angiographic images of the head were obtained using MRA technique without contrast.  COMPARISON:  Head CT 02/22/2013.  FINDINGS: Study is intermittently degraded by motion artifact despite repeated imaging attempts.  MRI HEAD FINDINGS  There is increased trace diffusion signal in the left posterior frontal lobe and parietal lobe (series 5, image 41) correspond to isointense to restricted diffusion (series 6, image 18). Associated T2 and FLAIR hyperintensity, plus superimposed fairly confluent bilateral corona radiata T2 and FLAIR increased signal. No associated mass effect or hemorrhage. Probably punctate sensory cortex involvement on series 5, image 44.  No other diffusion abnormality identified. Major intracranial vascular flow voids are preserved.  Cerebral volume is within normal limits for age. No ventriculomegaly. No midline shift, mass effect, or evidence of intracranial mass lesion. No acute intracranial hemorrhage  identified. No cortical encephalomalacia identified. Deep gray matter nuclei, brainstem and cerebellum within normal limits for age.  Negative pituitary, cervicomedullary junction visualized cervical spine. Normal bone marrow signal. Visualized orbit soft tissues are within normal limits. Visualized paranasal sinuses and mastoids are clear. Negative scalp soft tissues.  MRA HEAD FINDINGS  Antegrade flow in the posterior circulation with fairly codominant distal vertebral arteries. Normal right PICA. Patent vertebrobasilar junction. Mild basilar tortuosity and irregularity without stenosis. SCA and PCA origins within normal limits. Posterior communicating artery is diminutive or absent.  Antegrade flow in both ICA siphons with irregularity and asymmetric decreased signal of the distal left ICA siphon. Appearance raises the possibility of a hemodynamically significant stenosis in the supra clinoid segment (series 109, image 28). Subsequent lead asymmetric decreased flow signal at the left ICA terminus, left MCA, and left ACA origins.  Negative right ICA siphon, right MCA and ACA origins. Diminutive anterior communicating artery. Visualized ACA flow signal beyond the A1 segment on the left is within normal limits. Visualized right MCA branches are within normal limits.  Decreased flow signal in the left MCA M1 segment still, the left MCA bifurcation is patent. No major left MCA branch occlusion or high-grade stenosis identified.  IMPRESSION: MRI HEAD IMPRESSION  1. Acute to subacute small infarcts in the left posterior MCA territory mostly affecting white matter. No mass effect or hemorrhage.  2. Underlying bilateral nonspecific cerebral white matter signal changes, favor chronic small vessel disease.  MRA HEAD IMPRESSION  1. Constellation of findings suggestive of hemodynamically significant stenosis in the left supra clinoid ICA segment, resulting in decreased flow at the left ICA terminus, left MCA, and left ACA  origins.  2. No major left MCA branch occlusion. Left ACA flow is symmetric to that on the right be on the anterior communicating artery.  Study discussed by telephone with Dr. Thea Silversmith SHORT on 02/22/2013 at 16:34 .   Electronically Signed   By: Augusto Gamble M.D.   On: 02/22/2013 16:38   US Carotid Duplex Bilateral  02/22/2013   CLINICAL DATA:  Right arm weakness and numbness, TIA, history hypertension, coronary artery disease, non ischemic cardiomyopathy, COPD  EXAM: BILATERAL CAROTID DUPLEX ULTRASOUND  TECHNIQUE: Wallace Cullens scale imaging, color Doppler and duplex ultrasound were performed of bilateral carotid and vertebral arteries in the neck.  COMPARISON:  03/25/2004  FINDINGS: Criteria: Quantification of carotid stenosis is based on velocity parameters that correlate the residual internal carotid diameter with NASCET-based stenosis levels, using the diameter of the distal internal carotid lumen as the denominator for stenosis measurement.  The following velocity measurements were obtained:  RIGHT  ICA:  92/33 Cm/sec  CCA:  73/18 cm/sec  SYSTOLIC ICA/CCA RATIO:  1.26  DIASTOLIC ICA/CCA RATIO:  1.82  ECA:  109 cm/sec  LEFT  ICA:  168/19 cm/sec  CCA:  65/8 cm/sec  SYSTOLIC ICA/CCA RATIO:  2.60  DIASTOLIC ICA/CCA RATIO:  2.35  ECA:  144 cm/sec  RIGHT CAROTID ARTERY: Intimal thickening and noncalcified plaque right CCA. More prominent plaques at right carotid bulb into proximal right ICA, both hypoechoic and calcified non shadowing plaques. Laminar flow on color Doppler imaging with mild spectral broadening in right ICA on waveform analysis. No high velocity jets.  RIGHT VERTEBRAL ARTERY:  Patent, antegrade  LEFT CAROTID ARTERY: Intimal thickening and noncalcified plaque left CCA. More pronounced noncalcified plaque at left carotid bulb with calcified and noncalcified plaque at the proximal left ICA. Associated wih turbulent blood flow in left ICA on color Doppler imaging and spectral broadening on waveform analysis.  Increased velocity present.  LEFT VERTEBRAL ARTERY:  Patent, antegrade  IMPRESSION: Plaque formation in the carotid systems as above.  Velocities within the left ICA correspond to a 50-69% diameter stenosis.  Velocities within the right ICA correspond to a less than 50% diameter stenosis.   Electronically Signed   By: Ulyses Southward M.D.   On: 02/22/2013 17:21    Microbiology: No results found for this or any previous visit (from the past 240 hour(s)).   Labs: Basic Metabolic Panel:  Recent Labs Lab 02/22/13 1339 02/24/13 0550 02/25/13 0615  NA 140 140 138  K 3.6 3.9 3.8  CL 99 106 102  CO2 31 24 24   GLUCOSE 92 89 106*  BUN 13 15 16   CREATININE 1.09 1.20 1.13  CALCIUM 9.9 8.5 9.1   Liver Function Tests: No results found for this basename: AST, ALT, ALKPHOS, BILITOT, PROT, ALBUMIN,  in the last 168 hours No results found for this basename: LIPASE, AMYLASE,  in the last 168 hours No results found for this basename: AMMONIA,  in the last 168 hours CBC:  Recent Labs Lab 02/22/13 1339 02/24/13 0550 02/25/13 0615  WBC 6.0 4.5 4.7  NEUTROABS 3.5  --   --   HGB 12.9* 11.4* 11.8*  HCT 38.2* 33.3* 34.5*  MCV 89.3 87.6 86.9  PLT 156 124* 128*   Cardiac Enzymes: No results found for this basename: CKTOTAL, CKMB, CKMBINDEX, TROPONINI,  in the last 168 hours BNP: BNP (last 3 results) No results found for this basename: PROBNP,  in the last 8760 hours CBG: No results found for this basename: GLUCAP,  in the last 168 hours     Signed:  Harout Scheurich,CHRISTOPHER  [facility] 02/25/2013, 5:10 PM

## 2013-02-25 NOTE — Consult Note (Signed)
CARDIOLOGY CONSULT NOTE   Patient ID: Jorge Nixon MRN: [id number] DOB/AGE: 77/24/1926 77 y.o.  Admit date: 02/22/2013  Primary Physician   Catalina Pizza, MD Primary Cardiologist      Narda Rutherford Reason for Consultation   Pre-op eval  [name] is a 79 y.o. male with no history of CAD.  The patient needs vascular surgery. Cardiology is consulted for a preop evaluation. The patient denies any prior cardiac history. He does not have chest pain or shortness of breath. He is fully active. He had an echo in 2012 that revealed an ejection fraction in the range of 45-50%. A small focal wall motion abnormality could not be ruled out at that time. He had an echo this admission. Ejection fraction remains the same in the range of 45%. There is no definite focal wall motion abnormality. His EKGs reveal no significant abnormalities.   Past Medical History  Diagnosis Date  . Hypertension   . Diverticular disease 02/14/2011    problems swallowing  . GERD (gastroesophageal reflux disease)   . Coronary artery disease   . Kidney stone   . Hypothyroidism   . Pneumonia   . Anemia   . COPD (chronic obstructive pulmonary disease)   . Non-ischemic cardiomyopathy     mild  EF on echo 05/03/2012 55 to 60%  . Nephrolithiasis     lithotripsy 2006  . BPH (benign prostatic hyperplasia)      Past Surgical History  Procedure Laterality Date  . Total hip arthroplasty Right august 6th 2012    right side   . Replacement total knee Left 2006    left knee  . Transurethral resection of prostate  1996  . Knee arthroscopy Right 2010    Allergies  Allergen Reactions  . Sulfa Antibiotics Rash    I have reviewed the patient's current medications . aspirin  300 mg Rectal Daily   Or  . aspirin  325 mg Oral Daily  . clopidogrel  75 mg Oral Q breakfast  . enoxaparin (LOVENOX) injection  40 mg Subcutaneous Q24H  . feeding supplement  237 mL Oral BID BM  . levothyroxine  50 mcg Oral QAC breakfast  .  omega-3 acid ethyl esters  1 g Oral Daily  . simvastatin  20 mg Oral q1800   . sodium chloride    . sodium chloride 50 mL/hr at 02/24/13 0356   acetaminophen, acetaminophen, ondansetron (ZOFRAN) IV, senna-docusate  Prior to Admission medications   Medication Sig Start Date End Date Taking? Authorizing Provider  acetaminophen (TYLENOL) 500 MG tablet Take 500 mg by mouth every 6 (six) hours as needed.     Yes Historical Provider, MD  aspirin EC 81 MG tablet Take 81 mg by mouth daily.   Yes Historical Provider, MD  fish oil-omega-3 fatty acids 1000 MG capsule Take 1 g by mouth daily. Takes one capsule daily   Yes Historical Provider, MD  levothyroxine (SYNTHROID, LEVOTHROID) 50 MCG tablet Take 50 mcg by mouth daily before breakfast.   Yes Historical Provider, MD  losartan-hydrochlorothiazide (HYZAAR) 50-12.5 MG per tablet Take 0.5 tablets by mouth daily. Takes 1/2 tablet daily   Yes Historical Provider, MD     History   Social History  . Marital Status: Married    Spouse Name: N/A    Number of Children: 1  . Years of Education: N/A   Occupational History  .     Social History Main Topics  . Smoking status:  Never Smoker   . Smokeless tobacco: Not on file  . Alcohol Use: No  . Drug Use: No  . Sexual Activity:    Other Topics Concern  . Not on file   Social History Narrative  . No narrative on file    Family Status  Relation Status Death Age  . Father Deceased   . Mother Deceased   . Sister Alive   . Son Alive    Family History  Problem Relation Age of Onset  . Stroke Father   . Hypertension    . Diabetes      ROS: Patient denies fever, chills, headache, sweats, rash, change in vision, change in hearing, chest pain, cough, nausea vomiting, urinary symptoms. All other systems are reviewed and are negative.  Physical Exam: Blood pressure 138/82, pulse 75, temperature 98.1 F (36.7 C), temperature source Oral, resp. rate 20, height 5\' 11"  (1.803 m), weight 145 lb  (65.772 kg), SpO2 93.00%.    The patient is oriented to person time and place. Affect is normal. I spoke with his wife and his other family member in the room. He is lying flat in bed and comfortable. There is no jugulovenous distention. Lungs are clear. Respiratory effort is not labored. Cardiac exam reveals S1 and S2. There are no clicks or significant murmurs. The abdomen is soft. There is no peripheral edema. There no musculoskeletal deformities. There are no skin rashes.  Labs:   Lab Results  Component Value Date   WBC 4.7 02/25/2013   HGB 11.8* 02/25/2013   HCT 34.5* 02/25/2013   MCV 86.9 02/25/2013   PLT 128* 02/25/2013    Recent Labs  02/24/13 0550  INR 1.08    Recent Labs Lab 02/25/13 0615  NA 138  K 3.8  CL 102  CO2 24  BUN 16  CREATININE 1.13  CALCIUM 9.1  GLUCOSE 106*   Pro B Natriuretic peptide (BNP)  Date/Time Value Range Status  02/15/2011  4:05 PM 6945.0* 0 - 450 pg/mL Final  02/10/2011  3:41 PM 1046.0* 0 - 450 pg/mL Final   Lab Results  Component Value Date   CHOL 167 02/24/2013   HDL 25* 02/24/2013   LDLCALC 120* 02/24/2013   TRIG 109 02/24/2013    Echo: 02/23/2013 Study Conclusions - Left ventricle: The cavity size was normal. Wall thickness was normal. The estimated ejection fraction was 45%. Diffuse hypokinesis. Features are consistent with a pseudonormal left ventricular filling pattern, with concomitant abnormal relaxation and increased filling pressure (grade 2 diastolic dysfunction). - Aortic valve: Mild regurgitation. - Mitral valve: Mild regurgitation. - Right ventricle: Systolic function was mildly reduced. - Right atrium: The atrium was mildly dilated.  ECG:   There are no diagnostic EKG changes. I have personally reviewed the EKG this admission and a prior EKG.  Radiology:  No results found.  ASSESSMENT AND PLAN:   The patient was seen today by Dr. Myrtis Ser, the patient evaluated and the data reviewed.     TIA (transient ischemic  attack)    Hypertension     CVA (cerebral infarction)   Occlusion and stenosis of carotid artery with cerebral infarction     Ejection fraction 45%....old    Preop cardiovascular evaluation:      The patient is cleared from the cardiac viewpoint for vascular surgery. He is at some increased risk because of his age. However, he is quite stable. There are no recent cardiac symptoms. He has a good activity level. His ejection fraction is  45%. This appears to be unchanged from 2012. No further cardiac workup is needed. He is cleared for his surgery. I spoke with Dr. Pearlean Brownie.  SignedTheodore Demark, PA-C 02/25/2013 10:59 AM Beeper [phone] Patient seen and examined. I agree with the assessment and plan as detailed above. See also my additional thoughts below.   All of the data above has been reviewed. Most of the note written above is written by me. The patient is stable. I've spoken with his family and him. His cardiac status is stable. He is cleared for his vascular surgery.  Willa Rough, MD, [facility] 02/25/2013 12:04 PM

## 2013-02-25 NOTE — Progress Notes (Signed)
Stroke Team Progress Note  HISTORY  Jorge Nixon is an 79 y.o. male admitted to [facility] last night, 02/22/2013, in transfer from [facility] for further evaluation of two transient episodes of right upper extremity weakness consistent with TIAs. The history was obtained from the patient and his wife. Approximately 3 weeks ago after mowing the lawn the patient was noted to have weakness of his right upper extremity from the elbow on down. This episode lasted approximately 30 minutes. They did not seek medical attention at that time.   Yesterday, 02/22/2013, at approximately 12:30 after lunch the patient once again developed weakness of his right upper extremity. The patient's wife reported that this episode was much more severe as it involved the entire arm and the patient was unable to move his arm at all. Apparently this episode lasted approximately one hour. They came to the emergency department for further evaluation.   An MRI of the head showed acute to subacute small infarcts in the left posterior middle cerebral artery territory mostly affecting the white matter. An MRA of the head was consistent with hemodynamically significant stenosis in the left supraclinoid internal carotid artery segment with decreased flow to the left internal carotid artery terminus, left middle cerebral artery, and left anterior communicating origins. There was no major left middle cerebral artery branch occlusion.   In talking with the patient and his wife they would like to be aggressive in this situation in order to prevent a significant stroke. He and his wife stay active and usually walk at least a mile per day. The patient was on aspirin 81 mg daily PTA.  Date last known well: Date: 02/22/2013  Time last known well: Time: 12:30  tPA Given: No: TPA was not given as the patient's symptoms quickly resolved    SUBJECTIVE The patient's family was in room. Patient lying in bed. He has had no  further symptoms.   OBJECTIVE Most recent Vital Signs: Filed Vitals:   02/25/13 0200 02/25/13 0600 02/25/13 1004 02/25/13 1351  BP: 160/67 156/79 138/82 110/68  Pulse: 60 56 75 67  Temp: 98.2 F (36.8 C) 98.3 F (36.8 C) 98.1 F (36.7 C) 97.3 F (36.3 C)  TempSrc:   Oral   Resp: 18 18 20 18   Height:      Weight:      SpO2: 97% 97% 93% 97%   CBG (last 3)  No results found for this basename: GLUCAP,  in the last 72 hours  IV Fluid Intake:   . sodium chloride    . sodium chloride 50 mL/hr at 02/24/13 0356    MEDICATIONS  . aspirin  300 mg Rectal Daily   Or  . aspirin  325 mg Oral Daily  . clopidogrel  75 mg Oral Q breakfast  . enoxaparin (LOVENOX) injection  40 mg Subcutaneous Q24H  . feeding supplement  237 mL Oral BID BM  . levothyroxine  50 mcg Oral QAC breakfast  . omega-3 acid ethyl esters  1 g Oral Daily  . simvastatin  20 mg Oral q1800   PRN:  acetaminophen, acetaminophen, ondansetron (ZOFRAN) IV, senna-docusate  Diet:  Cardiac  Activity:  Activity as tolerated DVT Prophylaxis:  Lovenox  CLINICALLY SIGNIFICANT STUDIES Basic Metabolic Panel:   Recent Labs Lab 02/24/13 0550 02/25/13 0615  NA 140 138  K 3.9 3.8  CL 106 102  CO2 24 24  GLUCOSE 89 106*  BUN 15 16  CREATININE 1.20 1.13  CALCIUM 8.5  9.1   Liver Function Tests: No results found for this basename: AST, ALT, ALKPHOS, BILITOT, PROT, ALBUMIN,  in the last 168 hours CBC:   Recent Labs Lab 02/22/13 1339 02/24/13 0550 02/25/13 0615  WBC 6.0 4.5 4.7  NEUTROABS 3.5  --   --   HGB 12.9* 11.4* 11.8*  HCT 38.2* 33.3* 34.5*  MCV 89.3 87.6 86.9  PLT 156 124* 128*   Coagulation:   Recent Labs Lab 02/24/13 0550  LABPROT 13.8  INR 1.08   Cardiac Enzymes: No results found for this basename: CKTOTAL, CKMB, CKMBINDEX, TROPONINI,  in the last 168 hours Urinalysis: No results found for this basename: COLORURINE, APPERANCEUR, LABSPEC, PHURINE, GLUCOSEU, HGBUR, BILIRUBINUR, KETONESUR, PROTEINUR,  UROBILINOGEN, NITRITE, LEUKOCYTESUR,  in the last 168 hours Lipid Panel    Component Value Date/Time   CHOL 167 02/24/2013 0550   HgbA1C  Lab Results  Component Value Date   HGBA1C 5.7* 02/24/2013    Urine Drug Screen:   No results found for this basename: labopia,  cocainscrnur,  labbenz,  amphetmu,  thcu,  labbarb    Alcohol Level: No results found for this basename: ETH,  in the last 168 hours  Dg Chest 2 View 02/22/2013    No acute cardiopulmonary disease. Stable COPD/emphysema and scar/bronchiectasis in the right middle lobe and to a lesser degree the lingula. Stable biapical pleuroparenchymal scarring.    Ct Head Wo Contrast 02/22/2013    1. No acute intracranial abnormality. 2. Moderate atrophy and white matter disease likely reflects the sequela of chronic microvascular ischemia. 3. Atherosclerosis.    Cerebral angiogram 02/24/2013 High grade 75 to 80 % stenosis of LT ICA prox.  Mri Head Wo Contrast 02/22/2013    1. Acute to subacute small infarcts in the left posterior MCA territory mostly affecting white matter. No mass effect or hemorrhage.  2. Underlying bilateral nonspecific cerebral white matter signal changes, favor chronic small vessel disease.    Mra Brain Wo Contrast 02/22/2013 1. Constellation of findings suggestive of hemodynamically significant stenosis in the left supra clinoid ICA segment, resulting in decreased flow at the left ICA terminus, left MCA, and left ACA origins.  2. No major left MCA branch occlusion. Left ACA flow is symmetric to that on the right be on the anterior communicating artery.   US Carotid Duplex Bilateral 02/22/2013    Plaque formation in the carotid systems as above.  Velocities within the left ICA correspond to a 50-69% diameter stenosis.  Velocities within the right ICA correspond to a less than 50% diameter stenosis.  2D Echocardiogram  Ejection fraction 45% with diffuse hypokinesis and mild valvular abnormalities.. Grade 2  diastolic dysfunction. No cardiac source of emboli identified.   EKG  Sinus rhythm rate 69 beats per minute with sinus arrhythmia and septal infarct. (Official reading pending)   Therapy Recommendations - home health occupational therapy recommended with 24-hour per day supervision.  Physical Exam    General - pleasant 79 year old male in no acute distress  Neck - bilateral carotid bruits  Heart - Regular rate and rhythm - distant heart sounds  Lungs - Clear to auscultation  Abdomen - Soft - non tender  Extremities - weak to absent - no edema  Skin - Warm and dry    Neurologic Examination:  Mental Status:  Alert, oriented, thought content appropriate. Speech fluent without evidence of aphasia. Able to follow 3 step commands without difficulty.  Cranial Nerves:  II: Discs not visualized; Visual fields grossly normal, pupils  equal, round, reactive to light and accommodation  III,IV, VI: ptosis not present, extra-ocular motions intact bilaterally  V,VII: smile symmetric, facial light touch sensation normal bilaterally  VIII: hearing normal bilaterally  IX,X: gag reflex present  XI: bilateral shoulder shrug  XII: midline tongue extension  Motor:  Right : Upper extremity 5/5 Left: Upper extremity 5/5  Lower extremity 5/5 Lower extremity 5/5  Tone and bulk:normal tone throughout; no atrophy noted  Sensory: light touch intact throughout, bilaterally  Deep Tendon Reflexes: 2+ and symmetric throughout  Plantars:  Right: downgoing Left: downgoing  Cerebellar:  normal finger-to-nose except for mild tremor bilaterally  Gait: Deferred   ASSESSMENT Jorge Nixon is a 79 y.o. male presenting with 2 episodes of transient right upper extremity paresis. TPA was not given as deficits had resolved quickly. Imaging confirms acute to subacute small infarcts in the left posterior MCA territory and  MRA revealed a hemodynamically significant stenosis in the left supra clinoid ICA segment.  Infarct felt to be thrombotic due to to large vessel disease. On aspirin 81 mg orally every day prior to admission. Now on aspirin 325 mg orally every day and clopidogrel 75 mg orally every day for secondary stroke prevention. Patient with resultant resolution of deficits. Work up underway.   Symptomatic left carotid artery stenosis  Hypertension  Coronary artery disease with left ventricular dysfunction - ejection fraction 45%.  COPD by chest x-ray; however, patient never smoked.   Hospital day # 3  TREATMENT/PLAN  Continue aspirin 325 mg orally every day and clopidogrel 75 mg orally every day for secondary stroke prevention  Carotid stenosis, left.  Options of carotid endarterectomy versus stent were discussed with the patient at length including risk versus benefit and clearly stating that both are considered equivalent in low to moderate cardiac risk patients. I also spoke to Dr. Myrtis Ser cardiologist who has seen the patient today and feel that he is at low cardiac risk for carotid surgery.. Patient has decided CEA, as surgeon had discussed with him previously. See cardiology consult for clearance.  Recommendation: surgery no further than 2 weeks out from this event. We have asked patient to secure return date from Dr Imogene Burn for surgery before leaving hospital so he would have a plan to follow.  Gwendolyn Lima. Manson Passey, PAC, MBA, MHA [facility] Pager: [phone] 02/25/2013 5:00 PM  I have personally obtained a history, examined the patient, evaluated imaging results, and formulated the assessment and plan of care. I agree with the above. Delia Heady, MD

## 2013-02-25 NOTE — Progress Notes (Signed)
Physical Therapy Treatment Patient Details Name: ERNAN RUNKLES MRN: [id number] DOB: 1925-02-25 Today's Date: 02/25/2013 Time: 1478-2956 PT Time Calculation (min): 17 min  PT Assessment / Plan / Recommendation  History of Present Illness 79 y.o. s/p pt with hx of HTN/CAD, right UE weakness and MRI/MRA head revealing acute to subacute small infarcts in left post MCA territory, left supraclinoid ICA stenosis with diminished flow to the left ICA terminus, left MCA and left ACA origins.   PT Comments   Pt remembered walking with PT and using RW yesterday. Family present and agree that his walking appears to be at baseline. Pt with h/o one fall in past 12 months, however was due to slip/trip in the yard. Denies regular decr balance (stagger, veering off path). No further PT needs identified and PT is signing off.   Follow Up Recommendations  No PT follow up     Does the patient have the potential to tolerate intense rehabilitation     Barriers to Discharge        Equipment Recommendations  None recommended by PT    Recommendations for Other Services    Frequency     Progress towards PT Goals Progress towards PT goals: Goals met/education completed, patient discharged from PT  Plan Current plan remains appropriate    Precautions / Restrictions Precautions Precautions: Fall   Pertinent Vitals/Pain Denied pain    Mobility  Bed Mobility Bed Mobility: Supine to Sit;Sitting - Scoot to Edge of Bed Supine to Sit: 6: Modified independent (Device/Increase time);HOB flat Sitting - Scoot to Edge of Bed: 7: Independent Details for Bed Mobility Assistance: pt manipulated bed linens; no difficulties Transfers Transfers: Sit to Stand;Stand to Sit Sit to Stand: 5: Supervision;7: Independent Stand to Sit: 5: Supervision;7: Independent Details for Transfer Assistance: no deficits Ambulation/Gait Ambulation/Gait Assistance: 5: Supervision;7: Independent Ambulation Distance (Feet): 350  Feet Assistive device: None Ambulation/Gait Assistance Details: pt with limited ability to vary speed; reports he is at his baseline Gait Pattern: Wide base of support (decr RUE swing and trunk rotation) Gait velocity: decreased slightly Stairs: Yes Stairs Assistance: 6: Modified independent (Device/Increase time) Stair Management Technique: Two rails;Alternating pattern;Forwards Number of Stairs: 5 Modified Rankin (Stroke Patients Only) Pre-Morbid Rankin Score: No significant disability Modified Rankin: No significant disability    Exercises     PT Diagnosis:    PT Problem List:   PT Treatment Interventions:     PT Goals (current goals can now be found in the care plan section) Acute Rehab PT Goals Patient Stated Goal: go home  Visit Information  Last PT Received On: 02/25/13 Assistance Needed: +1 History of Present Illness: 79 y.o. s/p pt with hx of HTN/CAD, right UE weakness and MRI/MRA head revealing acute to subacute small infarcts in left post MCA territory, left supraclinoid ICA stenosis with diminished flow to the left ICA terminus, left MCA and left ACA origins.    Subjective Data  Subjective: Pt reports he does not usually use a RW. Has it from past ortho surgeries Patient Stated Goal: go home   Cognition  Cognition Arousal/Alertness: Awake/alert Behavior During Therapy: WFL for tasks assessed/performed Overall Cognitive Status: Within Functional Limits for tasks assessed    Balance  Balance Balance Assessed: Yes Dynamic Sitting Balance Dynamic Sitting - Balance Support: No upper extremity supported;Feet supported Dynamic Sitting - Level of Assistance: 7: Independent Dynamic Sitting Balance - Compensations: reaching to floor to pull up socks Static Standing Balance Static Standing - Balance Support: No upper extremity  supported Static Standing - Level of Assistance: 7: Independent Standardized Balance Assessment Standardized Balance Assessment: Berg Balance  Test Berg Balance Test Sit to Stand: Able to stand without using hands and stabilize independently Standing Unsupported: Able to stand safely 2 minutes Sitting with Back Unsupported but Feet Supported on Floor or Stool: Able to sit safely and securely 2 minutes Stand to Sit: Sits safely with minimal use of hands Transfers: Able to transfer safely, minor use of hands Standing Unsupported with Eyes Closed: Able to stand 10 seconds with supervision Standing Ubsupported with Feet Together: Able to place feet together independently and stand for 1 minute with supervision From Standing, Reach Forward with Outstretched Arm: Can reach forward >12 cm safely (5") From Standing Position, Pick up Object from Floor: Able to pick up shoe, needs supervision From Standing Position, Turn to Look Behind Over each Shoulder: Looks behind from both sides and weight shifts well Turn 360 Degrees: Able to turn 360 degrees safely but slowly Standing Unsupported, Alternately Place Feet on Step/Stool: Able to complete 4 steps without aid or supervision Standing Unsupported, One Foot in Front: Able to take small step independently and hold 30 seconds Standing on One Leg: Tries to lift leg/unable to hold 3 seconds but remains standing independently Total Score: 43  End of Session PT - End of Session Equipment Utilized During Treatment: Gait belt Activity Tolerance: Patient tolerated treatment well Patient left: in chair;with call bell/phone within reach;with family/visitor present Nurse Communication: Mobility status;Other (comment) (pt/family asking to walk without nursing)   GP     Micheal Sheen 02/25/2013, 9:37 AM Pager [phone]

## 2013-02-25 NOTE — Progress Notes (Signed)
Patient is currently active with [facility] Care Management for chronic disease management services.  Patient has been engaged by a [employer].  Our community based plan of care has focused on disease management of HTN.  Has sustained multiple falls at home without serious injury.  Walks in his neighborhood daily.  He eats three meals each day plus snacks.  Maintains his current weight well.  He has a very supportive wife at home who assist in his care.  Patient will receive a post discharge transition of care call and will be evaluated for monthly home visits for assessments and disease process education.  Made inpatient Case Manager aware that [facility] Care Management following. Of note, [facility] Care Management services does not replace or interfere with any services that are arranged by inpatient case management or social work.  For additional questions or referrals please contact Anibal Henderson BSN RN [facility] Liaison at [phone].

## 2013-02-25 NOTE — Progress Notes (Signed)
  Subjective: Cerebral arteriogram performed 9/28 revealed L ICA stenosis   Objective: Vital signs in last 24 hours: Temp:  [98.1 F (36.7 C)-98.6 F (37 C)] 98.1 F (36.7 C) (09/29 1004) Pulse Rate:  [55-75] 75 (09/29 1004) Resp:  [16-20] 20 (09/29 1004) BP: (132-160)/(65-82) 138/82 mmHg (09/29 1004) SpO2:  [93 %-98 %] 93 % (09/29 1004) Last BM Date: 02/25/13  Intake/Output from previous day: 09/28 0701 - 09/29 0700 In: -  Out: 325 [Urine:325] Intake/Output this shift:    PE: afeb; vss Resting; eating well Up in bed Rt groin NT; no bleeding; no hematoma Rt foot 2+ pulses Neuro intact  Lab Results:   Recent Labs  02/24/13 0550 02/25/13 0615  WBC 4.5 4.7  HGB 11.4* 11.8*  HCT 33.3* 34.5*  PLT 124* 128*   BMET  Recent Labs  02/24/13 0550 02/25/13 0615  NA 140 138  K 3.9 3.8  CL 106 102  CO2 24 24  GLUCOSE 89 106*  BUN 15 16  CREATININE 1.20 1.13  CALCIUM 8.5 9.1   PT/INR  Recent Labs  02/24/13 0550  LABPROT 13.8  INR 1.08   ABG No results found for this basename: PHART, PCO2, PO2, HCO3,  in the last 72 hours  Studies/Results: No results found.  Anti-infectives: Anti-infectives   None      Assessment/Plan: s/p * No surgery found *  L ICA stenosis per cerebral arteriogram For possible stent vs surgery Pt states he is set up for surgery in next few weeks   LOS: 3 days    Cyril Railey A 02/25/2013

## 2013-02-25 NOTE — Progress Notes (Signed)
Patient ID: Jorge Nixon, male   DOB: 1925-05-07, 79 y.o.   MRN: [id number]   After I had completed my consultation today I was informed that the patient has been followed over time by Dr. Rennis Golden. His cardiology care will be transferred to him tomorrow. The patient has had a nuclear stress test in the past showing no significant ischemia. The plans remain the same. The patient is cleared for possible surgery.  Jerral Bonito, MD

## 2013-02-26 DIAGNOSIS — I6789 Other cerebrovascular disease: Secondary | ICD-10-CM | POA: Diagnosis not present

## 2013-02-27 ENCOUNTER — Encounter: Payer: Self-pay | Admitting: Vascular Surgery

## 2013-02-27 DIAGNOSIS — Z23 Encounter for immunization: Secondary | ICD-10-CM | POA: Diagnosis not present

## 2013-02-28 ENCOUNTER — Encounter: Payer: Self-pay | Admitting: Vascular Surgery

## 2013-03-01 ENCOUNTER — Encounter: Payer: Self-pay | Admitting: Vascular Surgery

## 2013-03-01 ENCOUNTER — Ambulatory Visit (INDEPENDENT_AMBULATORY_CARE_PROVIDER_SITE_OTHER): Payer: Medicare Other | Admitting: Vascular Surgery

## 2013-03-01 ENCOUNTER — Encounter: Payer: Self-pay | Admitting: *Deleted

## 2013-03-01 ENCOUNTER — Other Ambulatory Visit: Payer: Self-pay | Admitting: *Deleted

## 2013-03-01 VITALS — BP 206/81 | HR 60 | Ht 71.0 in | Wt 145.1 lb

## 2013-03-01 DIAGNOSIS — I639 Cerebral infarction, unspecified: Secondary | ICD-10-CM

## 2013-03-01 DIAGNOSIS — I6350 Cerebral infarction due to unspecified occlusion or stenosis of unspecified cerebral artery: Secondary | ICD-10-CM | POA: Diagnosis not present

## 2013-03-01 DIAGNOSIS — I63239 Cerebral infarction due to unspecified occlusion or stenosis of unspecified carotid arteries: Secondary | ICD-10-CM | POA: Diagnosis not present

## 2013-03-01 DIAGNOSIS — I6529 Occlusion and stenosis of unspecified carotid artery: Secondary | ICD-10-CM | POA: Insufficient documentation

## 2013-03-01 NOTE — Progress Notes (Signed)
[facility] [place]    History of Present Illness  Jorge Nixon is a 79 y.o. male who presents with chief complaint: return from hospital admission.  The patient returns from recent admission for L side CVA.  Pt was found to have LICA >80%.  He has been cleared by Cardiology of L CEA.  The patient denies any new TIA or CVA sx.  Past Medical History   Diagnosis  Date   .  Hypertension    .  Diverticular disease  02/14/2011     problems swallowing   .  GERD (gastroesophageal reflux disease)    .  Coronary artery disease    .  Kidney stone    .  Hypothyroidism    .  Pneumonia    .  Anemia    .  COPD (chronic obstructive pulmonary disease)    .  Non-ischemic cardiomyopathy      mild EF on echo 05/03/2012 55 to 60%   .  Nephrolithiasis      lithotripsy 2006   .  BPH (benign prostatic hyperplasia)     Past Surgical History   Procedure  Laterality  Date   .  Total hip arthroplasty  Right  august 6th 2012     right side   .  Replacement total knee  Left  2006     left knee   .  Transurethral resection of prostate   1996   .  Knee arthroscopy  Right  2010    History    Social History   .  Marital Status:  Married     Spouse Name:  N/A     Number of Children:  1   .  Years of Education:  N/A    Occupational History   .      Social History Main Topics   .  Smoking status:  Never Smoker   .  Smokeless tobacco:  Not on file   .  Alcohol Use:  No   .  Drug Use:  No   .  Sexual Activity:     Other Topics  Concern   .  Not on file    Social History Narrative   .  No narrative on file    Family History   Problem  Relation  Age of Onset   .  Stroke  Father    .  Hypertension     .  Diabetes      Current Outpatient Prescriptions on File Prior to Visit  Medication Sig Dispense Refill  . acetaminophen (TYLENOL) 500 MG tablet Take 500 mg by mouth every 6 (six) hours as needed.        [name] aspirin 325 MG tablet Take 1 tablet (325 mg total) by mouth daily.   100 tablet  0  . clopidogrel (PLAVIX) 75 MG tablet Take 1 tablet (75 mg total) by mouth daily with breakfast.  30 tablet  0  . feeding supplement (ENSURE COMPLETE) LIQD Take 237 mLs by mouth 2 (two) times daily between meals.  60 Bottle  0  . fish oil-omega-3 fatty acids 1000 MG capsule Take 1 g by mouth daily. Takes one capsule daily      . levothyroxine (SYNTHROID, LEVOTHROID) 50 MCG tablet Take 50 mcg by mouth daily before breakfast.      . simvastatin (ZOCOR) 20 MG tablet Take 1 tablet (20 mg total) by mouth daily at 6 PM.  30 tablet  0   No current facility-administered medications on file prior to visit.   Allergies  Allergen Reactions  . Sulfa Antibiotics Rash    REVIEW OF SYSTEMS: (Positives checked otherwise negative)  CARDIOVASCULAR: []  chest pain, []  chest pressure, []  palpitations, []  shortness of breath when laying flat, []  shortness of breath with exertion, []  pain in feet when walking, []  pain in feet when laying flat, []  history of blood clot in veins (DVT), []  history of phlebitis, []  swelling in legs, []  varicose veins  PULMONARY: []  productive cough, []  asthma, []  wheezing  NEUROLOGIC: []  weakness in arms or legs, []  numbness in arms or legs, []  difficulty speaking or slurred speech, []  temporary loss of vision in one eye, []  dizziness  HEMATOLOGIC: []  bleeding problems, []  problems with blood clotting too easily  MUSCULOSKEL: []  joint pain, []  joint swelling  GASTROINTEST: []  vomiting blood, []  blood in stool  GENITOURINARY: []  burning with urination, []  blood in urine  PSYCHIATRIC: []  history of major depression  INTEGUMENTARY: []  rashes, []  ulcers  CONSTITUTIONAL: []  fever, []  chills   For VQI Use Only  PRE-ADM LIVING: Home  AMB STATUS: Ambulatory  CAD Sx: None  PRIOR CHF: None  STRESS TEST: [x]  No, [ ]  Normal, [ ]  + ischemia, [ ]  + MI, [ ]  Both   Physical Examination   Filed Vitals:   03/01/13 1418 03/01/13 1428  BP: 179/80 206/81  Pulse: 60   Height: 5'  11" (1.803 m)   Weight: 145 lb 1.6 oz (65.817 kg)   SpO2: 99%    Body mass index is 20.25 kg/(m^2).  General: A&O x 3, WD, elderly and thin  Head: [place]/AT, mild Temporalis wasting,  Ear/Nose/Throat: Hearing grossly intact, nares w/o erythema or drainage, oropharynx w/o Erythema/Exudate, Mallampati score: 2  Eyes: PERRLA, EOMI  Neck: Supple, no nuchal rigidity, no palpable LAD  Pulmonary: Sym exp, good air movt, CTAB, no rales, rhonchi, & wheezing  Cardiac: RRR, Nl S1, S2, no Murmurs, rubs or gallops  Vascular:  Vessel  Right  Left   Radial  Palpable  Palpable   Brachial  Palpable  Palpable   Carotid  Palpable, without bruit  Palpable, with bruit   Aorta  Not palpable  N/A   Femoral  Palpable  Palpable   Popliteal  Palpable  Palpable   PT  Palpable  Not Palpable   DP  Not Palpable  Palpable   Gastrointestinal: soft, NTND, -G/R, - HSM, - masses, - CVAT B  Musculoskeletal: M/S 5/5 throughout , Extremities without ischemic changes  Neurologic: CN 2-12 intact , Pain and light touch intact in extremities , Motor exam as listed above  Psychiatric: Judgment intact, Mood & affect appropriate for pt's clinical situation  Dermatologic: See M/S exam for extremity exam, no rashes otherwise noted  Lymph : No Cervical, Axillary, or Inguinal lymphadenopathy   Medical Decision Making  Jorge Nixon is a 79 y.o. male who presents with: L side CVA, sx ICA stenosis >80%, hyperlipidemia, known history of CAD, >54 years old    Pt has been cleared for L CEA, which I offered him. I discussed with the patient the risks, benefits, and alternatives to carotid endarterectomy.   The patient is a poor candidate for carotid artery stenting given his age, per [facility]. I discussed the procedural details of carotid endarterectomy with the patient.   The patient is aware that the risks of carotid endarterectomy include but are not limited to: bleeding, infection, stroke, myocardial  infarction, death, cranial nerve  injuries both temporary and permanent, neck hematoma, possible airway compromise, labile blood pressure post-operatively, cerebral hyperperfusion syndrome, and possible need for additional interventions in the future.  The patient is aware of the risks and agrees to proceed forward with the procedure.  This scheduled for 13 OCT 14 to give his brain another week to recovery and to stabilize his BP which is elevated due to failure to resume previous anti-HTN. With BP > 200, he needs to resume his anti-HTN regimen to avoid hemorrhagic stroke.  Leonides Sake, MD [facility] Office: [phone] Pager: [phone]  03/01/2013, 5:29 PM

## 2013-03-04 ENCOUNTER — Encounter (HOSPITAL_COMMUNITY): Payer: Self-pay | Admitting: Pharmacy Technician

## 2013-03-06 NOTE — Pre-Procedure Instructions (Signed)
Jorge Nixon  03/06/2013   Your procedure is scheduled on:  Monday, October 13th  Report to Main Entrance "A" at 0530 AM.  Call this number if you have problems the morning of surgery: [phone]   Remember:   Do not eat food or drink liquids after midnight.   Take these medicines the morning of surgery with A SIP OF WATER: synthroid, tylenol if needed   Do not wear jewelry.  Do not wear lotions, powders, or perfumes. You may wear deodorant.  Do not shave 48 hours prior to surgery. Men may shave face and neck.  Do not bring valuables to the hospital.  [facility] is not responsible  for any belongings or valuables.               Contacts, dentures or bridgework may not be worn into surgery.  Leave suitcase in the car. After surgery it may be brought to your room.  For patients admitted to the hospital, discharge time is determined by your   treatment team.    Special Instructions: Shower using CHG 2 nights before surgery and the night before surgery.  If you shower the day of surgery use CHG.  Use special wash - you have one bottle of CHG for all showers.  You should use approximately 1/3 of the bottle for each shower.   Please read over the following fact sheets that you were given: Pain Booklet, Coughing and Deep Breathing, Blood Transfusion Information, MRSA Information and Surgical Site Infection Prevention

## 2013-03-07 ENCOUNTER — Encounter (HOSPITAL_COMMUNITY): Payer: Self-pay

## 2013-03-07 ENCOUNTER — Encounter (HOSPITAL_COMMUNITY)
Admission: RE | Admit: 2013-03-07 | Discharge: 2013-03-07 | Disposition: A | Discharge status: Home / Self Care | Payer: Medicare Other | Source: Ambulatory Visit | Attending: Vascular Surgery | Admitting: Vascular Surgery

## 2013-03-07 DIAGNOSIS — Z01812 Encounter for preprocedural laboratory examination: Secondary | ICD-10-CM | POA: Insufficient documentation

## 2013-03-07 DIAGNOSIS — Z01818 Encounter for other preprocedural examination: Secondary | ICD-10-CM | POA: Insufficient documentation

## 2013-03-07 HISTORY — DX: Transient cerebral ischemic attack, unspecified: G45.9

## 2013-03-07 HISTORY — DX: Unspecified osteoarthritis, unspecified site: M19.90

## 2013-03-07 HISTORY — DX: Personal history of other medical treatment: Z92.89

## 2013-03-07 HISTORY — DX: Headache: R51

## 2013-03-07 LAB — COMPREHENSIVE METABOLIC PANEL
ALT: 12 U/L (ref 0–53)
AST: 27 U/L (ref 0–37)
Albumin: 4.3 g/dL (ref 3.5–5.2)
Alkaline Phosphatase: 67 U/L (ref 39–117)
CO2: 28 mEq/L (ref 19–32)
Calcium: 9.6 mg/dL (ref 8.4–10.5)
GFR calc non Af Amer: 51 mL/min — ABNORMAL LOW (ref >90)
Glucose, Bld: 76 mg/dL (ref 70–99)
Potassium: 3.8 mEq/L (ref 3.5–5.1)
Sodium: 139 mEq/L (ref 135–145)
Total Protein: 7.8 g/dL (ref 6.0–8.3)

## 2013-03-07 LAB — CBC
Hemoglobin: 12.6 g/dL — ABNORMAL LOW (ref 13.0–17.0)
MCH: 30 pg (ref 26.0–34.0)
MCHC: 34.1 g/dL (ref 30.0–36.0)
Platelets: 168 10*3/uL (ref 150–400)
RDW: 14 % (ref 11.5–15.5)

## 2013-03-07 LAB — URINALYSIS, ROUTINE W REFLEX MICROSCOPIC
Bilirubin Urine: NEGATIVE
Ketones, ur: NEGATIVE mg/dL
Nitrite: NEGATIVE
Protein, ur: NEGATIVE mg/dL
Specific Gravity, Urine: 1.018 (ref 1.005–1.030)
Urobilinogen, UA: 0.2 mg/dL (ref 0.0–1.0)

## 2013-03-07 LAB — URINE MICROSCOPIC-ADD ON

## 2013-03-07 LAB — SURGICAL PCR SCREEN: Staphylococcus aureus: NEGATIVE

## 2013-03-07 LAB — PROTIME-INR: INR: 1.06 (ref 0.00–1.49)

## 2013-03-07 LAB — TYPE AND SCREEN
ABO/RH(D): A NEG
Antibody Screen: NEGATIVE

## 2013-03-07 LAB — APTT: aPTT: 27 seconds (ref 24–37)

## 2013-03-07 NOTE — Progress Notes (Signed)
Primary physician - Dr. Catalina Pizza [place] Cardiologist - [facility] until their office in [place] closed, was seen by cards in hospital in end of September.  Stress 2012 in epic, echo/ekg sept 2014 in epic Cardiac clearance in epic

## 2013-03-10 MED ORDER — SODIUM CHLORIDE 0.9 % IV SOLN: INTRAVENOUS | Status: DC

## 2013-03-10 MED ORDER — DEXTROSE 5 % IV SOLN
1.5000 g | INTRAVENOUS | Status: AC
  Administered 2013-03-11: 1.5 g via INTRAVENOUS
  Filled 2013-03-10: qty 1.5

## 2013-03-11 ENCOUNTER — Encounter (HOSPITAL_COMMUNITY): Payer: Self-pay | Admitting: *Deleted

## 2013-03-11 ENCOUNTER — Encounter (HOSPITAL_COMMUNITY)
Admission: RE | Disposition: A | Discharge status: Home / Self Care | Payer: Self-pay | Source: Ambulatory Visit | Attending: Vascular Surgery

## 2013-03-11 ENCOUNTER — Inpatient Hospital Stay (HOSPITAL_COMMUNITY): Payer: Medicare Other | Admitting: Anesthesiology

## 2013-03-11 ENCOUNTER — Telehealth: Payer: Self-pay | Admitting: Vascular Surgery

## 2013-03-11 ENCOUNTER — Encounter (HOSPITAL_COMMUNITY): Payer: Medicare Other | Admitting: Anesthesiology

## 2013-03-11 ENCOUNTER — Inpatient Hospital Stay (HOSPITAL_COMMUNITY)
Admission: RE | Admit: 2013-03-11 | Discharge: 2013-03-12 | DRG: 038 | Disposition: A | Discharge status: Home / Self Care | Payer: Medicare Other | Source: Ambulatory Visit | Attending: Vascular Surgery | Admitting: Vascular Surgery

## 2013-03-11 DIAGNOSIS — E039 Hypothyroidism, unspecified: Secondary | ICD-10-CM | POA: Diagnosis present

## 2013-03-11 DIAGNOSIS — I63239 Cerebral infarction due to unspecified occlusion or stenosis of unspecified carotid arteries: Secondary | ICD-10-CM | POA: Diagnosis not present

## 2013-03-11 DIAGNOSIS — I2510 Atherosclerotic heart disease of native coronary artery without angina pectoris: Secondary | ICD-10-CM | POA: Diagnosis present

## 2013-03-11 DIAGNOSIS — Z96649 Presence of unspecified artificial hip joint: Secondary | ICD-10-CM | POA: Diagnosis not present

## 2013-03-11 DIAGNOSIS — E785 Hyperlipidemia, unspecified: Secondary | ICD-10-CM | POA: Diagnosis present

## 2013-03-11 DIAGNOSIS — J449 Chronic obstructive pulmonary disease, unspecified: Secondary | ICD-10-CM | POA: Diagnosis not present

## 2013-03-11 DIAGNOSIS — R0602 Shortness of breath: Secondary | ICD-10-CM | POA: Diagnosis not present

## 2013-03-11 DIAGNOSIS — I428 Other cardiomyopathies: Secondary | ICD-10-CM | POA: Diagnosis present

## 2013-03-11 DIAGNOSIS — K219 Gastro-esophageal reflux disease without esophagitis: Secondary | ICD-10-CM | POA: Diagnosis present

## 2013-03-11 DIAGNOSIS — I639 Cerebral infarction, unspecified: Secondary | ICD-10-CM

## 2013-03-11 DIAGNOSIS — Z8673 Personal history of transient ischemic attack (TIA), and cerebral infarction without residual deficits: Secondary | ICD-10-CM

## 2013-03-11 DIAGNOSIS — I10 Essential (primary) hypertension: Secondary | ICD-10-CM | POA: Diagnosis present

## 2013-03-11 DIAGNOSIS — J4489 Other specified chronic obstructive pulmonary disease: Secondary | ICD-10-CM | POA: Diagnosis not present

## 2013-03-11 DIAGNOSIS — Z96659 Presence of unspecified artificial knee joint: Secondary | ICD-10-CM | POA: Diagnosis not present

## 2013-03-11 DIAGNOSIS — I6529 Occlusion and stenosis of unspecified carotid artery: Secondary | ICD-10-CM | POA: Diagnosis not present

## 2013-03-11 DIAGNOSIS — N400 Enlarged prostate without lower urinary tract symptoms: Secondary | ICD-10-CM | POA: Diagnosis present

## 2013-03-11 HISTORY — PX: CAROTID ENDARTERECTOMY: SUR193

## 2013-03-11 HISTORY — PX: ENDARTERECTOMY: SHX5162

## 2013-03-11 HISTORY — DX: Cerebral infarction, unspecified: I63.9

## 2013-03-11 SURGERY — ENDARTERECTOMY, CAROTID
Anesthesia: General | Site: Neck | Laterality: Left | Wound class: Clean

## 2013-03-11 MED ORDER — ALBUMIN HUMAN 5 % IV SOLN
12.5000 g | Freq: Once | INTRAVENOUS | Status: AC
  Administered 2013-03-11: 12.5 g via INTRAVENOUS

## 2013-03-11 MED ORDER — HEPARIN SODIUM (PORCINE) 1000 UNIT/ML IJ SOLN
INTRAMUSCULAR | Status: DC | PRN
  Administered 2013-03-11: 6000 [IU] via INTRAVENOUS

## 2013-03-11 MED ORDER — LIDOCAINE HCL (PF) 1 % IJ SOLN
INTRAMUSCULAR | Status: AC
  Filled 2013-03-11: qty 30

## 2013-03-11 MED ORDER — OXYCODONE-ACETAMINOPHEN 5-325 MG PO TABS
1.0000 | ORAL_TABLET | ORAL | Status: DC | PRN
  Administered 2013-03-12: 1 via ORAL
  Filled 2013-03-11: qty 1

## 2013-03-11 MED ORDER — LABETALOL HCL 5 MG/ML IV SOLN: 10.0000 mg | INTRAVENOUS | Status: DC | PRN

## 2013-03-11 MED ORDER — ONDANSETRON HCL 4 MG/2ML IJ SOLN
4.0000 mg | Freq: Four times a day (QID) | INTRAMUSCULAR | Status: DC | PRN
  Administered 2013-03-11: 4 mg via INTRAVENOUS

## 2013-03-11 MED ORDER — FENTANYL CITRATE 0.05 MG/ML IJ SOLN
INTRAMUSCULAR | Status: DC | PRN
  Administered 2013-03-11: 250 ug via INTRAVENOUS

## 2013-03-11 MED ORDER — LEVOTHYROXINE SODIUM 50 MCG PO TABS
50.0000 ug | ORAL_TABLET | Freq: Every day | ORAL | Status: DC
  Administered 2013-03-12: 50 ug via ORAL
  Filled 2013-03-11 (×2): qty 1

## 2013-03-11 MED ORDER — LOSARTAN POTASSIUM-HCTZ 50-12.5 MG PO TABS: 0.5000 | ORAL_TABLET | Freq: Every day | ORAL | Status: DC

## 2013-03-11 MED ORDER — SIMVASTATIN 20 MG PO TABS
20.0000 mg | ORAL_TABLET | Freq: Every day | ORAL | Status: DC
  Filled 2013-03-11 (×2): qty 1

## 2013-03-11 MED ORDER — DEXTROSE-NACL 5-0.9 % IV SOLN
INTRAVENOUS | Status: DC
  Administered 2013-03-11: 16:00:00 via INTRAVENOUS

## 2013-03-11 MED ORDER — LACTATED RINGERS IV SOLN
INTRAVENOUS | Status: DC | PRN
  Administered 2013-03-11 (×2): via INTRAVENOUS

## 2013-03-11 MED ORDER — HYDRALAZINE HCL 20 MG/ML IJ SOLN: 10.0000 mg | INTRAMUSCULAR | Status: DC | PRN

## 2013-03-11 MED ORDER — PHENOL 1.4 % MT LIQD
1.0000 | OROMUCOSAL | Status: DC | PRN
  Administered 2013-03-11: 1 via OROMUCOSAL
  Filled 2013-03-11: qty 177

## 2013-03-11 MED ORDER — DOCUSATE SODIUM 100 MG PO CAPS
100.0000 mg | ORAL_CAPSULE | Freq: Every day | ORAL | Status: DC
  Administered 2013-03-12: 100 mg via ORAL
  Filled 2013-03-11 (×2): qty 1

## 2013-03-11 MED ORDER — DEXTROSE 5 % IV SOLN
1.5000 g | Freq: Two times a day (BID) | INTRAVENOUS | Status: AC
  Administered 2013-03-11 – 2013-03-12 (×2): 1.5 g via INTRAVENOUS
  Filled 2013-03-11 (×2): qty 1.5

## 2013-03-11 MED ORDER — LOSARTAN POTASSIUM 25 MG PO TABS
25.0000 mg | ORAL_TABLET | Freq: Every day | ORAL | Status: DC
  Filled 2013-03-11: qty 1

## 2013-03-11 MED ORDER — ALBUMIN HUMAN 5 % IV SOLN
INTRAVENOUS | Status: AC
  Filled 2013-03-11: qty 250

## 2013-03-11 MED ORDER — PHENYLEPHRINE HCL 10 MG/ML IJ SOLN
10.0000 mg | INTRAVENOUS | Status: DC | PRN
  Administered 2013-03-11: 25 ug/min via INTRAVENOUS

## 2013-03-11 MED ORDER — THROMBIN 20000 UNITS EX SOLR
CUTANEOUS | Status: DC | PRN
  Administered 2013-03-11: 10:00:00 via TOPICAL

## 2013-03-11 MED ORDER — 0.9 % SODIUM CHLORIDE (POUR BTL) OPTIME
TOPICAL | Status: DC | PRN
  Administered 2013-03-11 (×2): 1000 mL

## 2013-03-11 MED ORDER — FLEET ENEMA 7-19 GM/118ML RE ENEM
1.0000 | ENEMA | Freq: Once | RECTAL | Status: AC
  Administered 2013-03-11: 1 via RECTAL
  Filled 2013-03-11: qty 1

## 2013-03-11 MED ORDER — ASPIRIN 325 MG PO TABS
325.0000 mg | ORAL_TABLET | Freq: Every day | ORAL | Status: DC
  Administered 2013-03-12: 325 mg via ORAL
  Filled 2013-03-11: qty 1

## 2013-03-11 MED ORDER — ROCURONIUM BROMIDE 100 MG/10ML IV SOLN
INTRAVENOUS | Status: DC | PRN
  Administered 2013-03-11: 50 mg via INTRAVENOUS

## 2013-03-11 MED ORDER — NEOSTIGMINE METHYLSULFATE 1 MG/ML IJ SOLN
INTRAMUSCULAR | Status: DC | PRN
  Administered 2013-03-11: 3 mg via INTRAVENOUS

## 2013-03-11 MED ORDER — OXYCODONE HCL 5 MG PO TABS: 5.0000 mg | ORAL_TABLET | ORAL | Status: DC | PRN

## 2013-03-11 MED ORDER — ONDANSETRON HCL 4 MG/2ML IJ SOLN
INTRAMUSCULAR | Status: AC
  Filled 2013-03-11: qty 2

## 2013-03-11 MED ORDER — EPHEDRINE SULFATE 50 MG/ML IJ SOLN
10.0000 mg | Freq: Once | INTRAMUSCULAR | Status: AC
  Administered 2013-03-11: 10 mg via INTRAVENOUS

## 2013-03-11 MED ORDER — DEXTRAN 40 IN SALINE 10-0.9 % IV SOLN
INTRAVENOUS | Status: DC | PRN
  Administered 2013-03-11: 500 mL

## 2013-03-11 MED ORDER — GUAIFENESIN-DM 100-10 MG/5ML PO SYRP: 15.0000 mL | ORAL_SOLUTION | ORAL | Status: DC | PRN

## 2013-03-11 MED ORDER — PROPOFOL 10 MG/ML IV BOLUS
INTRAVENOUS | Status: DC | PRN
  Administered 2013-03-11: 80 mg via INTRAVENOUS

## 2013-03-11 MED ORDER — LIDOCAINE HCL (CARDIAC) 20 MG/ML IV SOLN
INTRAVENOUS | Status: DC | PRN
  Administered 2013-03-11: 40 mg via INTRAVENOUS

## 2013-03-11 MED ORDER — ACETAMINOPHEN 325 MG PO TABS: 325.0000 mg | ORAL_TABLET | ORAL | Status: DC | PRN

## 2013-03-11 MED ORDER — ALBUMIN HUMAN 5 % IV SOLN
INTRAVENOUS | Status: AC
  Administered 2013-03-11: 12.5 g via INTRAVENOUS
  Filled 2013-03-11: qty 250

## 2013-03-11 MED ORDER — POTASSIUM CHLORIDE CRYS ER 20 MEQ PO TBCR: 20.0000 meq | EXTENDED_RELEASE_TABLET | Freq: Once | ORAL | Status: AC | PRN

## 2013-03-11 MED ORDER — PROTAMINE SULFATE 10 MG/ML IV SOLN
INTRAVENOUS | Status: DC | PRN
Start: 2013-03-11 — End: 2013-03-11
  Administered 2013-03-11: 20 mg via INTRAVENOUS
  Administered 2013-03-11: 10 mg via INTRAVENOUS

## 2013-03-11 MED ORDER — GLYCOPYRROLATE 0.2 MG/ML IJ SOLN
INTRAMUSCULAR | Status: DC | PRN
  Administered 2013-03-11: .2 mg via INTRAVENOUS
  Administered 2013-03-11: .6 mg via INTRAVENOUS

## 2013-03-11 MED ORDER — METOPROLOL TARTRATE 1 MG/ML IV SOLN: 2.0000 mg | INTRAVENOUS | Status: DC | PRN

## 2013-03-11 MED ORDER — DEXTRAN 40 IN SALINE 10-0.9 % IV SOLN
INTRAVENOUS | Status: AC
  Filled 2013-03-11: qty 500

## 2013-03-11 MED ORDER — ACETAMINOPHEN 325 MG RE SUPP
325.0000 mg | RECTAL | Status: DC | PRN
  Filled 2013-03-11: qty 2

## 2013-03-11 MED ORDER — SODIUM CHLORIDE 0.9 % IV SOLN
500.0000 mL | Freq: Once | INTRAVENOUS | Status: AC | PRN
  Administered 2013-03-11: 500 mL via INTRAVENOUS

## 2013-03-11 MED ORDER — THROMBIN 20000 UNITS EX SOLR
CUTANEOUS | Status: AC
  Filled 2013-03-11: qty 20000

## 2013-03-11 MED ORDER — SODIUM CHLORIDE 0.9 % IR SOLN
Status: DC | PRN
  Administered 2013-03-11: 08:00:00

## 2013-03-11 MED ORDER — EPHEDRINE SULFATE 50 MG/ML IJ SOLN
INTRAMUSCULAR | Status: AC
  Filled 2013-03-11: qty 1

## 2013-03-11 MED ORDER — DROPERIDOL 2.5 MG/ML IJ SOLN: 0.6250 mg | INTRAMUSCULAR | Status: DC | PRN

## 2013-03-11 MED ORDER — HYDROCHLOROTHIAZIDE 10 MG/ML ORAL SUSPENSION
6.2500 mg | Freq: Every day | ORAL | Status: DC
  Filled 2013-03-11: qty 1.25

## 2013-03-11 MED ORDER — FENTANYL CITRATE 0.05 MG/ML IJ SOLN: 25.0000 ug | INTRAMUSCULAR | Status: DC | PRN

## 2013-03-11 MED ORDER — PANTOPRAZOLE SODIUM 40 MG PO TBEC
40.0000 mg | DELAYED_RELEASE_TABLET | Freq: Every day | ORAL | Status: DC
  Administered 2013-03-12: 40 mg via ORAL
  Filled 2013-03-11: qty 1

## 2013-03-11 MED ORDER — ONDANSETRON HCL 4 MG/2ML IJ SOLN
INTRAMUSCULAR | Status: DC | PRN
  Administered 2013-03-11: 4 mg via INTRAMUSCULAR

## 2013-03-11 MED ORDER — WHITE PETROLATUM GEL
Status: AC
  Administered 2013-03-11
  Filled 2013-03-11: qty 5

## 2013-03-11 MED ORDER — HYDROMORPHONE HCL PF 1 MG/ML IJ SOLN: 0.5000 mg | INTRAMUSCULAR | Status: DC | PRN

## 2013-03-11 SURGICAL SUPPLY — 57 items
ADH SKN CLS APL DERMABOND .7 (GAUZE/BANDAGES/DRESSINGS) ×1
ADPR TBG 2 MALE LL ART (MISCELLANEOUS)
BAG DECANTER FOR FLEXI CONT (MISCELLANEOUS) ×2 IMPLANT
CANISTER SUCTION 2500CC (MISCELLANEOUS) ×2 IMPLANT
CATH ROBINSON RED A/P 18FR (CATHETERS) ×3 IMPLANT
CATH SUCT 10FR WHISTLE TIP (CATHETERS) ×2 IMPLANT
CLIP TI MEDIUM 24 (CLIP) ×2 IMPLANT
CLIP TI WIDE RED SMALL 24 (CLIP) ×2 IMPLANT
COVER PROBE W GEL 5X96 (DRAPES) ×1 IMPLANT
COVER SURGICAL LIGHT HANDLE (MISCELLANEOUS) ×2 IMPLANT
CRADLE DONUT ADULT HEAD (MISCELLANEOUS) ×2 IMPLANT
DERMABOND ADVANCED (GAUZE/BANDAGES/DRESSINGS) ×1
DERMABOND ADVANCED .7 DNX12 (GAUZE/BANDAGES/DRESSINGS) ×1 IMPLANT
DRAPE WARM FLUID 44X44 (DRAPE) ×2 IMPLANT
DRSG TEGADERM 2-3/8X2-3/4 SM (GAUZE/BANDAGES/DRESSINGS) ×1 IMPLANT
ELECT REM PT RETURN 9FT ADLT (ELECTROSURGICAL) ×2
ELECTRODE REM PT RTRN 9FT ADLT (ELECTROSURGICAL) ×1 IMPLANT
GAUZE SPONGE 2X2 8PLY STRL LF (GAUZE/BANDAGES/DRESSINGS) IMPLANT
GLOVE BIO SURGEON STRL SZ7 (GLOVE) ×4 IMPLANT
GLOVE BIOGEL PI IND STRL 6.5 (GLOVE) IMPLANT
GLOVE BIOGEL PI IND STRL 7.0 (GLOVE) IMPLANT
GLOVE BIOGEL PI IND STRL 7.5 (GLOVE) ×1 IMPLANT
GLOVE BIOGEL PI INDICATOR 6.5 (GLOVE) ×1
GLOVE BIOGEL PI INDICATOR 7.0 (GLOVE) ×2
GLOVE BIOGEL PI INDICATOR 7.5 (GLOVE) ×1
GOWN PREVENTION PLUS XLARGE (GOWN DISPOSABLE) ×1 IMPLANT
GOWN STRL NON-REIN LRG LVL3 (GOWN DISPOSABLE) ×7 IMPLANT
IV ADAPTER SYR DOUBLE MALE LL (MISCELLANEOUS) IMPLANT
KIT BASIN OR (CUSTOM PROCEDURE TRAY) ×2 IMPLANT
KIT ROOM TURNOVER OR (KITS) ×2 IMPLANT
NS IRRIG 1000ML POUR BTL (IV SOLUTION) ×4 IMPLANT
PACK CAROTID (CUSTOM PROCEDURE TRAY) ×2 IMPLANT
PAD ARMBOARD 7.5X6 YLW CONV (MISCELLANEOUS) ×4 IMPLANT
PATCH VASCULAR VASCU GUARD 1X6 (Vascular Products) ×2 IMPLANT
SET COLLECT BLD 21X3/4 12 PB (MISCELLANEOUS) IMPLANT
SHUNT CAROTID BYPASS 10 (VASCULAR PRODUCTS) ×1 IMPLANT
SHUNT CAROTID BYPASS 12FRX15.5 (VASCULAR PRODUCTS) IMPLANT
SPONGE GAUZE 2X2 STER 10/PKG (GAUZE/BANDAGES/DRESSINGS) ×1
SPONGE GAUZE 4X4 12PLY (GAUZE/BANDAGES/DRESSINGS) ×1 IMPLANT
SPONGE SURGIFOAM ABS GEL 100 (HEMOSTASIS) IMPLANT
STOPCOCK 4 WAY LG BORE MALE ST (IV SETS) IMPLANT
SUT ETHILON 3 0 PS 1 (SUTURE) ×1 IMPLANT
SUT MNCRL AB 4-0 PS2 18 (SUTURE) ×2 IMPLANT
SUT PROLENE 6 0 BV (SUTURE) ×4 IMPLANT
SUT PROLENE 7 0 BV 1 (SUTURE) IMPLANT
SUT SILK 3 0 TIES 17X18 (SUTURE)
SUT SILK 3-0 18XBRD TIE BLK (SUTURE) IMPLANT
SUT VIC AB 3-0 SH 27 (SUTURE) ×2
SUT VIC AB 3-0 SH 27X BRD (SUTURE) ×1 IMPLANT
SYR TB 1ML LUER SLIP (SYRINGE) IMPLANT
SYSTEM CHEST DRAIN TLS 7FR (DRAIN) ×1 IMPLANT
TAPE CLOTH SURG 4X10 WHT LF (GAUZE/BANDAGES/DRESSINGS) ×1 IMPLANT
TOWEL OR 17X24 6PK STRL BLUE (TOWEL DISPOSABLE) ×2 IMPLANT
TOWEL OR 17X26 10 PK STRL BLUE (TOWEL DISPOSABLE) ×2 IMPLANT
TUBING ART PRESS 48 MALE/FEM (TUBING) IMPLANT
TUBING EXTENTION W/L.L. (IV SETS) IMPLANT
WATER STERILE IRR 1000ML POUR (IV SOLUTION) ×2 IMPLANT

## 2013-03-11 NOTE — Transfer of Care (Signed)
Immediate Anesthesia Transfer of Care Note  Patient: Jorge Nixon  Procedure(s) Performed: Procedure(s): ENDARTERECTOMY CAROTID with Vascu-Guard Bovine Patch. (Left)  Patient Location: PACU  Anesthesia Type:General  Level of Consciousness: awake, alert  and oriented  Airway & Oxygen Therapy: Patient Spontanous Breathing and Patient connected to nasal cannula oxygen  Post-op Assessment: Report given to PACU RN, Post -op Vital signs reviewed and stable, Patient moving all extremities X 4 and Patient able to stick tongue midline  Post vital signs: Reviewed and stable  Complications: No apparent anesthesia complications

## 2013-03-11 NOTE — Interval H&P Note (Signed)
History and Physical Interval Note:  03/11/2013 7:29 AM  Jorge Nixon  has presented today for surgery, with the diagnosis of LEFT ICA STENOSIS  The various methods of treatment have been discussed with the patient and family. After consideration of risks, benefits and other options for treatment, the patient has consented to  Procedure(s): ENDARTERECTOMY CAROTID (Left) as a surgical intervention .  The patient's history has been reviewed, patient examined, no change in status, stable for surgery.  I have reviewed the patient's chart and labs.  Questions were answered to the patient's satisfaction.     Annis Lagoy LIANG-YU

## 2013-03-11 NOTE — Op Note (Signed)
OPERATIVE NOTE  PROCEDURE:   1.  left carotid endarterectomy with bovine patch angioplasty 2.  left intraoperative carotid ultrasound  PRE-OPERATIVE DIAGNOSIS: left symptomatic carotid stenosis >70%  POST-OPERATIVE DIAGNOSIS: same as above   SURGEON: Leonides Sake, MD  ANESTHESIA: general  ESTIMATED BLOOD LOSS: 100 cc  FINDING(S): 1.  Continuous Doppler audible flow signatures are appropriate for each carotid artery. 2.  No evidence of intimal flap visualized on transverse or longitudinal ultrasonography. 3.  Carotid plaque with necrotic core  SPECIMEN(S):  Carotid plaque (sent to Pathology)  INDICATIONS:   Jorge Nixon is a 79 y.o. male who presents with left symptomatic carotid stenosis >70%.  I discussed with the patient the risks, benefits, and alternatives to carotid endarterectomy.  I discussed the procedural details of carotid endarterectomy with the patient.  The patient is aware that the risks of carotid endarterectomy include but are not limited to: bleeding, infection, stroke, myocardial infarction, death, cranial nerve injuries both temporary and permanent, neck hematoma, possible airway compromise, labile blood pressure post-operatively, cerebral hyperperfusion syndrome, and possible need for additional interventions in the future. The patient is aware of the risks and agrees to proceed forward with the procedure.  DESCRIPTION: After full informed written consent was obtained from the patient, the patient was brought back to the operating room and placed supine upon the operating table.  Prior to induction, the patient received IV antibiotics.  After obtaining adequate anesthesia, the patient was placed into semi-Fowler position with a shoulder roll in place and the patient's neck slightly hyperextended and rotated away from the surgical site.  The patient was prepped in the standard fashion for a left carotid endarterectomy.  I made an incision anterior to the sternocleidomastoid  muscle and dissected down through the subcutaneous tissue.  The platysmas was opened with electrocautery.  Then I dissected down to the internal jugular vein.  This was dissected posteriorly until I obtained visualization of the common carotid artery.  This was dissected out and then an umbilical tape was placed around the common carotid artery and I loosely applied a Rumel tourniquet.  I then dissected in a periadventitial fashion along the common carotid artery up to the bifurcation.  I then identified the external carotid artery and the superior thyroid artery.  A 2-0 silk tie was looped around the superior thyroid artery, and I also dissected out the external carotid artery and placed a vessel loop around it.  In continuing the dissection to the internal carotid artery, I identified the facial vein.  This was ligated and then transected, giving me improved exposure of the internal carotid artery.  In the process of this dissection, the hypoglossal nerve was identified.  I then dissected out the internal carotid artery until I identified an area of soft tissue in the internal carotid artery.  I dissected slightly distal to this area, and placed an umbilical tape around the artery and loosely applied a Rumel tourniquet.  At this point, we gave the patient a therapeutic bolus of Heparin intravenously (roughly 80 units/kg).  After waiting 3 minutes, then I clamped the internal carotid artery, external carotid artery and then the common carotid artery.  I then made an arteriotomy in the common carotid artery with a 11 blade, and extended the arteriotomy with a Potts scissor down into the common carotid artery, then I carried the arteriotomy through the bifurcation into the internal carotid artery until I reached an area that was not diseased.  At this point, I took  the 10 shunt that previously been prepared and I inserted it into the internal carotid artery.  The Rumel tourniquet was then applied to this end of the  shunt.  I unclamped the shunt to verify retrograde blood flow in the internal carotid artery.  I then placed the other end of the shunt into the common carotid artery after unclamping the artery.  The Rumel was tightened down around the shunt.  At this point, I verified blood flow in the shunt with a continuous doppler.  At this point, I started the endarterectomy in the common carotid artery with a Cytogeneticist and carried this dissection down into the common carotid artery circumferentially.  Then I transected the plaque at a segment where it was adherent.  I then carried this dissection up into the external carotid artery.  The plaque was extracted by unclamping the external carotid artery and everting the artery.  The dissection was then carried into the internal carotid artery, extracting the remaining portion of the carotid plaque.  I passed the plaque off the field as a specimen.  I then spent the next 30 minutes removing intimal flaps and loose debris.  Eventually I reached the point where the residual plaque was densely adherent and any further dissection would compromise the integrity of the wall.  After verifying that there was no more loose intimal flaps or debris, I re-interrogated the entirety of this carotid artery.  At this point, I was satisfied that the minimal remaining disease was densely adherent to the wall and wall integrity was intact.  At this point, I then fashioned a bovine pericardial patch for the geometry of this artery and sewed it in place with two running stitch of 6-0 Prolene, one from each end.  Prior to completing this patch angioplasty, I removed the shunt first from the internal carotid artery, from which there was excellent backbleeding, and clamped it.  Then I removed the shunt from the common carotid artery, from which there was excellent antegrade bleeding, and then clamped it.  At this point, I allowed the external carotid artery to backbleed, which was excellent.   Then I instilled heparinized saline in this patched artery and then completed the patch angioplasty in the usual fashion.  First, I released the clamp on the external carotid artery, then I released it on the common carotid artery.  After waiting a few seconds, I then released it on the internal carotid artery.  I then interrogated this patient's arteries with the continuous Doppler.  The audible waveforms in each artery were consistent with the expected characteristics for each artery.  The Sonosite probe was then sterilely draped and used to interrogate the carotid artery in both longitudinal and transverse views.  At this point, I washed out the wound, and placed thrombin and Gelfoam throughout.  I also gave the patient 30 mg of protamine to reverse his anticoagulation.   After waiting a few minutes, I removed the thrombin and Gelfoam and washed out the wound.  There was no more active bleeding in the surgical site.   I then reapproximated the platysma muscle with a running stitch of 3-0 Vicryl.  The skin was then reapproximated with a running subcuticular 4-0 Monocryl stitch.  The skin was then cleaned, dried and Dermabond was used to reinforce the skin closure.  The patient woke without any problems, neurologically intact.     COMPLICATIONS: none  CONDITION: stable  Leonides Sake, MD [facility] [place] Office: [phone]  Pager: [phone]  03/11/2013, 9:51 AM

## 2013-03-11 NOTE — Telephone Encounter (Addendum)
Message copied by Fredrich Birks on Mon Mar 11, 2013  1:51 PM ------      Message from: Phillips Odor      Created: Mon Mar 11, 2013 11:13 AM                   ----- Message -----         From: Marlowe Shores, PA-C         Sent: 03/11/2013   8:42 AM           To: Melene Plan, RN, [facility] Pool            2-3 weeks CEA F/U Imogene Burn ------  03/11/13: lm for pt at home # regarding appt on 04/05/2013 @ 10;45am, dpm

## 2013-03-11 NOTE — H&P (View-Only) (Signed)
[facility] [place]    History of Present Illness  Jorge Nixon is a 79 y.o. male who presents with chief complaint: return from hospital admission.  The patient returns from recent admission for L side CVA.  Pt was found to have LICA >80%.  He has been cleared by Cardiology of L CEA.  The patient denies any new TIA or CVA sx.  Past Medical History   Diagnosis  Date   .  Hypertension    .  Diverticular disease  02/14/2011     problems swallowing   .  GERD (gastroesophageal reflux disease)    .  Coronary artery disease    .  Kidney stone    .  Hypothyroidism    .  Pneumonia    .  Anemia    .  COPD (chronic obstructive pulmonary disease)    .  Non-ischemic cardiomyopathy      mild EF on echo 05/03/2012 55 to 60%   .  Nephrolithiasis      lithotripsy 2006   .  BPH (benign prostatic hyperplasia)     Past Surgical History   Procedure  Laterality  Date   .  Total hip arthroplasty  Right  august 6th 2012     right side   .  Replacement total knee  Left  2006     left knee   .  Transurethral resection of prostate   1996   .  Knee arthroscopy  Right  2010    History    Social History   .  Marital Status:  Married     Spouse Name:  N/A     Number of Children:  1   .  Years of Education:  N/A    Occupational History   .      Social History Main Topics   .  Smoking status:  Never Smoker   .  Smokeless tobacco:  Not on file   .  Alcohol Use:  No   .  Drug Use:  No   .  Sexual Activity:     Other Topics  Concern   .  Not on file    Social History Narrative   .  No narrative on file    Family History   Problem  Relation  Age of Onset   .  Stroke  Father    .  Hypertension     .  Diabetes      Current Outpatient Prescriptions on File Prior to Visit  Medication Sig Dispense Refill  . acetaminophen (TYLENOL) 500 MG tablet Take 500 mg by mouth every 6 (six) hours as needed.        . aspirin 325 MG tablet Take 1 tablet (325 mg total) by mouth daily.   100 tablet  0  . clopidogrel (PLAVIX) 75 MG tablet Take 1 tablet (75 mg total) by mouth daily with breakfast.  30 tablet  0  . feeding supplement (ENSURE COMPLETE) LIQD Take 237 mLs by mouth 2 (two) times daily between meals.  60 Bottle  0  . fish oil-omega-3 fatty acids 1000 MG capsule Take 1 g by mouth daily. Takes one capsule daily      . levothyroxine (SYNTHROID, LEVOTHROID) 50 MCG tablet Take 50 mcg by mouth daily before breakfast.      . simvastatin (ZOCOR) 20 MG tablet Take 1 tablet (20 mg total) by mouth daily at 6 PM.  30 tablet    0   No current facility-administered medications on file prior to visit.   Allergies  Allergen Reactions  . Sulfa Antibiotics Rash    REVIEW OF SYSTEMS: (Positives checked otherwise negative)  CARDIOVASCULAR: [] chest pain, [] chest pressure, [] palpitations, [] shortness of breath when laying flat, [] shortness of breath with exertion, [] pain in feet when walking, [] pain in feet when laying flat, [] history of blood clot in veins (DVT), [] history of phlebitis, [] swelling in legs, [] varicose veins  PULMONARY: [] productive cough, [] asthma, [] wheezing  NEUROLOGIC: [] weakness in arms or legs, [] numbness in arms or legs, [] difficulty speaking or slurred speech, [] temporary loss of vision in one eye, [] dizziness  HEMATOLOGIC: [] bleeding problems, [] problems with blood clotting too easily  MUSCULOSKEL: [] joint pain, [] joint swelling  GASTROINTEST: [] vomiting blood, [] blood in stool  GENITOURINARY: [] burning with urination, [] blood in urine  PSYCHIATRIC: [] history of major depression  INTEGUMENTARY: [] rashes, [] ulcers  CONSTITUTIONAL: [] fever, [] chills   For VQI Use Only  PRE-ADM LIVING: Home  AMB STATUS: Ambulatory  CAD Sx: None  PRIOR CHF: None  STRESS TEST: [x] No, [ ] Normal, [ ] + ischemia, [ ] + MI, [ ] Both   Physical Examination   Filed Vitals:   03/01/13 1418 03/01/13 1428  BP: 179/80 206/81  Pulse: 60   Height: 5'  11" (1.803 m)   Weight: 145 lb 1.6 oz (65.817 kg)   SpO2: 99%    Body mass index is 20.25 kg/(m^2).  General: A&O x 3, WD, elderly and thin  Head: Broussard/AT, mild Temporalis wasting,  Ear/Nose/Throat: Hearing grossly intact, nares w/o erythema or drainage, oropharynx w/o Erythema/Exudate, Mallampati score: 2  Eyes: PERRLA, EOMI  Neck: Supple, no nuchal rigidity, no palpable LAD  Pulmonary: Sym exp, good air movt, CTAB, no rales, rhonchi, & wheezing  Cardiac: RRR, Nl S1, S2, no Murmurs, rubs or gallops  Vascular:  Vessel  Right  Left   Radial  Palpable  Palpable   Brachial  Palpable  Palpable   Carotid  Palpable, without bruit  Palpable, with bruit   Aorta  Not palpable  N/A   Femoral  Palpable  Palpable   Popliteal  Palpable  Palpable   PT  Palpable  Not Palpable   DP  Not Palpable  Palpable   Gastrointestinal: soft, NTND, -G/R, - HSM, - masses, - CVAT B  Musculoskeletal: M/S 5/5 throughout , Extremities without ischemic changes  Neurologic: CN 2-12 intact , Pain and light touch intact in extremities , Motor exam as listed above  Psychiatric: Judgment intact, Mood & affect appropriate for pt's clinical situation  Dermatologic: See M/S exam for extremity exam, no rashes otherwise noted  Lymph : No Cervical, Axillary, or Inguinal lymphadenopathy   Medical Decision Making  Jorge Nixon is a 79 y.o. male who presents with: L side CVA, sx ICA stenosis >80%, hyperlipidemia, known history of CAD, >80 years old    Pt has been cleared for L CEA, which I offered him. I discussed with the patient the risks, benefits, and alternatives to carotid endarterectomy.   The patient is a poor candidate for carotid artery stenting given his age, per [facility]. I discussed the procedural details of carotid endarterectomy with the patient.   The patient is aware that the risks of carotid endarterectomy include but are not limited to: bleeding, infection, stroke, myocardial   infarction, death, cranial nerve  injuries both temporary and permanent, neck hematoma, possible airway compromise, labile blood pressure post-operatively, cerebral hyperperfusion syndrome, and possible need for additional interventions in the future.  The patient is aware of the risks and agrees to proceed forward with the procedure.  This scheduled for 13 OCT 14 to give his brain another week to recovery and to stabilize his BP which is elevated due to failure to resume previous anti-HTN. With BP > 200, he needs to resume his anti-HTN regimen to avoid hemorrhagic stroke.  Jorge Jost, MD [facility] [place] Office: [phone] Pager: [phone]  03/01/2013, 5:29 PM  

## 2013-03-11 NOTE — Anesthesia Postprocedure Evaluation (Signed)
  Anesthesia Post-op Note  Patient: Jorge Nixon  Procedure(s) Performed: Procedure(s): ENDARTERECTOMY CAROTID with Vascu-Guard Bovine Patch. (Left)  Patient Location: PACU  Anesthesia Type:General  Level of Consciousness: awake, alert , oriented and patient cooperative  Airway and Oxygen Therapy: Patient Spontanous Breathing and Patient connected to nasal cannula oxygen  Post-op Pain: none  Post-op Assessment: Post-op Vital signs reviewed, Patient's Cardiovascular Status Stable, Respiratory Function Stable, Patent Airway and Pain level controlled, nausea improved  Post-op Vital Signs: Reviewed and stable  Complications: No apparent anesthesia complications

## 2013-03-11 NOTE — Anesthesia Preprocedure Evaluation (Addendum)
Anesthesia Evaluation  Patient identified by MRN, date of birth, ID band Patient awake    Reviewed: Allergy & Precautions, H&P , NPO status , Patient's Chart, lab work & pertinent test results  History of Anesthesia Complications Negative for: history of anesthetic complications  Airway Mallampati: I TM Distance: >3 FB Neck ROM: Full    Dental  (+) Teeth Intact and Dental Advisory Given   Pulmonary COPD breath sounds clear to auscultation  Pulmonary exam normal       Cardiovascular hypertension, Pt. on medications + CAD and + Peripheral Vascular Disease Rhythm:Regular Rate:Normal + Systolic murmurs 1/61 ECHO: diffuse hypokinesis, EF 45%, mild MR, mild AI   Neuro/Psych TIA   GI/Hepatic Neg liver ROS, GERD-  Controlled,  Endo/Other  Hypothyroidism   Renal/GU negative Renal ROS     Musculoskeletal   Abdominal   Peds  Hematology   Anesthesia Other Findings   Reproductive/Obstetrics                          Anesthesia Physical Anesthesia Plan  ASA: III  Anesthesia Plan: General   Post-op Pain Management:    Induction: Intravenous  Airway Management Planned: Oral ETT  Additional Equipment: Arterial line  Intra-op Plan:   Post-operative Plan: Extubation in OR  Informed Consent: I have reviewed the patients History and Physical, chart, labs and discussed the procedure including the risks, benefits and alternatives for the proposed anesthesia with the patient or authorized representative who has indicated his/her understanding and acceptance.   Dental advisory given  Plan Discussed with: Surgeon and CRNA  Anesthesia Plan Comments: (Plan routine monitors, A line, GETA)        Anesthesia Quick Evaluation

## 2013-03-11 NOTE — Preoperative (Signed)
Beta Blockers   Reason not to administer Beta Blockers:Not Applicable 

## 2013-03-12 LAB — CBC
HCT: 27.6 % — ABNORMAL LOW (ref 39.0–52.0)
Hemoglobin: 9.4 g/dL — ABNORMAL LOW (ref 13.0–17.0)
MCHC: 34.1 g/dL (ref 30.0–36.0)
RDW: 14.4 % (ref 11.5–15.5)
WBC: 5.2 10*3/uL (ref 4.0–10.5)

## 2013-03-12 LAB — BASIC METABOLIC PANEL
BUN: 14 mg/dL (ref 6–23)
CO2: 24 mEq/L (ref 19–32)
Chloride: 108 mEq/L (ref 96–112)
Creatinine, Ser: 1.1 mg/dL (ref 0.50–1.35)
GFR calc Af Amer: 68 mL/min — ABNORMAL LOW (ref >90)
GFR calc non Af Amer: 59 mL/min — ABNORMAL LOW (ref >90)
Potassium: 3.4 mEq/L — ABNORMAL LOW (ref 3.5–5.1)
Sodium: 141 mEq/L (ref 135–145)

## 2013-03-12 MED ORDER — LOSARTAN POTASSIUM-HCTZ 50-12.5 MG PO TABS: 0.5000 | ORAL_TABLET | Freq: Every day | ORAL | Status: DC

## 2013-03-12 NOTE — Discharge Summary (Signed)
[facility] Discharge Summary  Jorge Nixon 03-Dec-1924 79 y.o. male  [id number]  Admission Date: 03/11/2013  Discharge Date: 03/12/13  Physician: Fransisco Hertz, MD  Admission Diagnosis: LEFT ICA STENOSIS   HPI:   This is a 79 y.o. male who presents with chief complaint: return from hospital admission. The patient returns from recent admission for L side CVA. Pt was found to have LICA >80%. He has been cleared by Cardiology of L CEA. The patient denies any new TIA or CVA sx.  Hospital Course:  The patient was admitted to the hospital and taken to the operating room on 03/11/2013 and underwent left carotid endarterectomy.  The pt tolerated the procedure well and was transported to the PACU in good condition.   By POD 1, the pt neuro status is in tact.  He does state that he took a "trip" during surgery and could feel sharp pain, could see tubing, and heard voices, but could not understand them.  He is doing well otherwise this am.  Anesthesia to see pt this am.  There is minimal drainage in the drain and it is removed.  His Blood pressure is soft this am-will have him restart his ARB tomorrow.  The remainder of the hospital course consisted of increasing mobilization and increasing intake of solids without difficulty.    Recent Labs  03/12/13 0530  NA 141  K 3.4*  CL 108  CO2 24  GLUCOSE 99  BUN 14  CALCIUM 8.1*    Recent Labs  03/12/13 0530  WBC 5.2  HGB 9.4*  HCT 27.6*  PLT 110*   No results found for this basename: INR,  in the last 72 hours  Discharge Instructions:   The patient is discharged to home with extensive instructions on wound care and progressive ambulation.  They are instructed not to drive or perform any heavy lifting until returning to see the physician in his office.  Discharge Orders   Future Appointments Provider Department Dept Phone   04/05/2013 10:45 AM Fransisco Hertz, MD [facility]  [phone]   05/01/2013 2:00 PM Ronal Fear, NP [facility] [phone]   Future Orders Complete By Expires   Call MD for:  redness, tenderness, or signs of infection (pain, swelling, bleeding, redness, odor or green/yellow discharge around incision site)  As directed    Call MD for:  severe or increased pain, loss or decreased feeling  in affected limb(s)  As directed    Call MD for:  temperature >100.5  As directed    CAROTID Sugery: Call MD for difficulty swallowing or speaking; weakness in arms or legs that is a new symtom; severe headache.  If you have increased swelling in the neck and/or  are having difficulty breathing, CALL 911  As directed    Driving Restrictions  As directed    Comments:     No driving for 2 weeks   Increase activity slowly  As directed    Comments:     Walk with assistance use walker or cane as needed   Lifting restrictions  As directed    Comments:     No lifting for 4 weeks   May shower   As directed    Scheduling Instructions:     Wednesday   may wash over wound with mild soap and water  As directed    No dressing needed  As directed    Resume previous diet  As  directed       Discharge Diagnosis:  LEFT ICA STENOSIS  Secondary Diagnosis: Patient Active Problem List   Diagnosis Date Noted  . CVA (cerebral infarction) 02/23/2013  . Occlusion and stenosis of carotid artery with cerebral infarction 02/23/2013  . TIA (transient ischemic attack) 02/22/2013  . Right arm weakness 02/22/2013  . Hypertension 02/22/2013  . CAD (coronary artery disease) 02/22/2013  . Esophageal dysmotility 02/24/2011  . Esophageal web 02/24/2011  . Mouth pain 02/24/2011  . Acute renal failure 02/16/2011  . Hypokalemia 02/13/2011  . Protein-calorie malnutrition, severe 02/13/2011  . PNA (pneumonia) 02/10/2011  . Dysphagia 02/10/2011  . Anemia 02/10/2011  . Weakness generalized 02/10/2011  . S/P hip replacement right 02/10/2011   Past Medical  History  Diagnosis Date  . Hypertension   . Diverticular disease 02/14/2011    problems swallowing  . GERD (gastroesophageal reflux disease)   . Coronary artery disease   . Kidney stone   . Hypothyroidism   . Anemia   . COPD (chronic obstructive pulmonary disease)   . Non-ischemic cardiomyopathy     mild  EF on echo 05/03/2012 55 to 60%  . Nephrolithiasis     lithotripsy 2006  . BPH (benign prostatic hyperplasia)   . TIA (transient ischemic attack)   . Shortness of breath     exertion  . Headache(784.0)   . Dizziness   . Arthritis   . History of blood transfusion   . Pneumonia     hx of - hospitalized      Medication List         acetaminophen 500 MG tablet  Commonly known as:  TYLENOL  Take 500 mg by mouth every 6 (six) hours as needed for pain.     aspirin 325 MG tablet  Take 1 tablet (325 mg total) by mouth daily.     clopidogrel 75 MG tablet  Commonly known as:  PLAVIX  Take 1 tablet (75 mg total) by mouth daily with breakfast.     feeding supplement (ENSURE COMPLETE) Liqd  Take 237 mLs by mouth 2 (two) times daily between meals.     fish oil-omega-3 fatty acids 1000 MG capsule  Take 1 g by mouth daily.     levothyroxine 50 MCG tablet  Commonly known as:  SYNTHROID, LEVOTHROID  Take 50 mcg by mouth daily before breakfast.     losartan-hydrochlorothiazide 50-12.5 MG per tablet  Commonly known as:  HYZAAR  Take 0.5 tablets by mouth daily.  Start taking on:  03/13/2013     oxyCODONE 5 MG immediate release tablet  Commonly known as:  ROXICODONE  Take 1 tablet (5 mg total) by mouth every 4 (four) hours as needed for pain.     simvastatin 20 MG tablet  Commonly known as:  ZOCOR  Take 1 tablet (20 mg total) by mouth daily at 6 PM.        Roxicodone #30 No Refill  Disposition: home  Patient's condition: is Good  Follow up: 1. Dr. Imogene Burn  in 2 weeks.   Doreatha Massed, PA-C [facility] [phone]  Addendum  I have  independently interviewed and examined the patient, and I agree with the physician assistant's discharge summary.  The patient underwent left carotid endarterectomy for sx LICA >70%.  The L CEA was uneventful as was his post-operative recovery.    Leonides Sake, MD [facility] [place] Office: [phone] Pager: [phone]  03/12/2013, 9:55 AM    ---  For [facility] use --- Instructions: Press F2 to tab through selections.  Delete question if not applicable.   Modified Rankin score at D/C (0-6): 0  IV medication needed for:  1. Hypertension: No 2. Hypotension: No  Post-op Complications: No  1. Post-op CVA or TIA: No  2. CN injury: No  3. Myocardial infarction: No  4.  CHF: No  5.  Dysrhythmia (new): No  6. Wound infection: No  7. Reperfusion symptoms: No  8. Return to OR: No  Discharge medications: Statin use:  Yes If No: [ ]  For Medical reasons, [ ]  Non-compliant, [ ]  Not-indicated ASA use:  Yes  If No: [ ]  For Medical reasons, [ ]  Non-compliant, [ ]  Not-indicated Beta blocker use:  No If No: [ ]  For Medical reasons, [ ]  Non-compliant, [ ]  Not-indicated ACE-Inhibitor use:  No-he is on ARB If No: [ ]  For Medical reasons, [ ]  Non-compliant, [ ]  Not-indicated P2Y12 Antagonist use: yes, [ ]  Plavix, [ ]  Plasugrel, [ ]  Ticlopinine, [ ]  Ticagrelor, [ ]  Other, [ ]  No for medical reason, [ ]  Non-compliant, [ ]  Not-indicated Anti-coagulant use:  no, [ ]  Warfarin, [ ]  Rivaroxaban, [ ]  Dabigatran, [ ]  Other, [ ]  No for medical reason, [ ]  Non-compliant, [ ]  Not-indicated

## 2013-03-12 NOTE — Progress Notes (Signed)
Pt ambulating without difficulty.  Red top drain and PIV d/c'd per order.  Discharge instructions given to pt and family.  Both verbalized understanding with all questions answered.  Pt discharged to home with wife and stepdaughter.  Roselie Awkward, RN

## 2013-03-12 NOTE — Progress Notes (Addendum)
[facility] Progress Note  03/12/2013 7:44 AM 1 Day Post-Op  Subjective:  Pt states he is doing fine this morning. Had a little bit of dizziness when getting up.  States he went on a trip during surgery and could see tubes and hear voices and had several bouts of sharp pain.  Afebrile HR 50's-60's regular 100's-120's systolic   Filed Vitals:   03/12/13 0700  BP:   Pulse:   Temp: 98.5 F (36.9 C)  Resp:      Physical Exam: Neuro:  In tact. Incision:  C/d/i  CBC    Component Value Date/Time   WBC 5.2 03/12/2013 0530   RBC 3.11* 03/12/2013 0530   RBC 3.17* 02/11/2011 0450   HGB 9.4* 03/12/2013 0530   HCT 27.6* 03/12/2013 0530   PLT 110* 03/12/2013 0530   MCV 88.7 03/12/2013 0530   MCH 30.2 03/12/2013 0530   MCHC 34.1 03/12/2013 0530   RDW 14.4 03/12/2013 0530   LYMPHSABS 1.8 02/22/2013 1339   MONOABS 0.5 02/22/2013 1339   EOSABS 0.2 02/22/2013 1339   BASOSABS 0.0 02/22/2013 1339    BMET    Component Value Date/Time   NA 141 03/12/2013 0530   K 3.4* 03/12/2013 0530   CL 108 03/12/2013 0530   CO2 24 03/12/2013 0530   GLUCOSE 99 03/12/2013 0530   BUN 14 03/12/2013 0530   CREATININE 1.10 03/12/2013 0530   CALCIUM 8.1* 03/12/2013 0530   GFRNONAA 59* 03/12/2013 0530   GFRAA 68* 03/12/2013 0530     Intake/Output Summary (Last 24 hours) at 03/12/13 0744 Last data filed at 03/12/13 0600  Gross per 24 hour  Intake   3210 ml  Output    860 ml  Net   2350 ml      Assessment/Plan:  This is a 79 y.o. male who is s/p left CEA 1 Day Post-Op  -pt is doing well this am. -pt neuro exam is in tact -pt has not ambulated and needs to ambulate -discharge home today -will have anesthesia speak with pt about intraoperative events   Doreatha Massed, PA-C [facility] [phone]  Addendum  I have independently interviewed and examined the patient, and I agree with the physician assistant's findings.  No evidence of active  bleeding, as no evidence of GI bleed and no hematoma in L neck.  Suspect lab error either in preop H/H or postop H/H as only 100-200 cc blood loss intraop.   Minimal TLS drainage.  Neuro exam intact with sym motor strength.  Ok to d/c pt home.  Leonides Sake, MD [facility] [place] Office: [phone] Pager: [phone]  03/12/2013, 10:04 AM

## 2013-03-12 NOTE — Progress Notes (Signed)
Anesthesiology Follow-up:  79 year old white male with a previous L. brain CVA underwent L. carotid endarterectomy by Dr. Imogene Burn yesterday.  Today Jorge Nixon states that he could hear voices during the surgery and that he experienced several episodes of sharp pain around his head and neck and that he that had the sensation of floating.  By chart review and discussion with Daiva Nakayama, CRNA, the intra-operative course was uneventful. Propofol 5mg  and fetanyl 250 mcg. were used for induction and desflurane was administered as an inhalational agent (inspired concentration 4.3-5.8%). The BIS monitor read from 37-48 during surgery.   I explained to Jorge Nixon, his wife and daughter that he may have had awareness under anesthesia despite the fact that his anesthetic regimen was routine and the BIS monitor had indicated that the anesthetic depth was sufficient to protect against awareness. I also explained that in rare cases, patients have experienced awareness under anesthesia despite seemingly adequate levels  of anesthesia. I also told Jorge Nixon and his family that should he require anesthesia again to tell his anesthesia providers about this episode of possible awareness. All other questions were answered to their satisfaction.   Jorge Brood, MD

## 2013-03-13 ENCOUNTER — Other Ambulatory Visit: Payer: Self-pay | Admitting: *Deleted

## 2013-03-13 ENCOUNTER — Encounter (HOSPITAL_COMMUNITY): Payer: Self-pay | Admitting: Vascular Surgery

## 2013-03-13 DIAGNOSIS — G8918 Other acute postprocedural pain: Secondary | ICD-10-CM

## 2013-03-13 MED ORDER — TRAMADOL HCL 50 MG PO TABS: 50.0000 mg | ORAL_TABLET | Freq: Four times a day (QID) | ORAL | Status: DC | PRN

## 2013-03-13 NOTE — Progress Notes (Signed)
[facility] nurse Marja Kays called to report that the oxycodone IR was too strong for this elderly patient. Jorge Nixon was requesting another type of pain medicine for his postoperative pain. The nurse states that the patient is afebrile, eating and drinking well and his lungs are clear; O2 is 96%.   BP and HR are within normal limits. I called in Tramadol to the [facility] in [place] and cancelled the oxycodone Rx in [facility].

## 2013-03-20 DIAGNOSIS — E039 Hypothyroidism, unspecified: Secondary | ICD-10-CM | POA: Diagnosis not present

## 2013-03-20 DIAGNOSIS — I6529 Occlusion and stenosis of unspecified carotid artery: Secondary | ICD-10-CM | POA: Diagnosis not present

## 2013-03-22 ENCOUNTER — Telehealth: Payer: Self-pay

## 2013-03-22 NOTE — Telephone Encounter (Addendum)
Rec'd call from [facility], RN/ Case Manager from [facility].  Stated Dr. Catalina Pizza questioned if pt. should be on ASA "325mg " dosage, instead of "81 mg" dosage, daily, in conjunction with the Plavix 75 mg daily.  Reports pt. is a "fall risk."   Discussed with Dr. Imogene Burn.  Recommends pt. To change to ASA 81 mg. Daily; and to stop the ASA 325 mg qd.  Notified pt's wife per phone of dosage change to ASA 81 mg qd.  Wife verb. Understanding.  Notified Alisa, RN for [facility] via voice message of dosage change to ASA 81 mg ad.

## 2013-04-04 ENCOUNTER — Encounter: Payer: Self-pay | Admitting: Vascular Surgery

## 2013-04-05 ENCOUNTER — Encounter: Payer: Self-pay | Admitting: Vascular Surgery

## 2013-04-05 ENCOUNTER — Ambulatory Visit (INDEPENDENT_AMBULATORY_CARE_PROVIDER_SITE_OTHER): Payer: Self-pay | Admitting: Vascular Surgery

## 2013-04-05 VITALS — BP 180/62 | HR 61 | Ht 71.0 in | Wt 145.3 lb

## 2013-04-05 DIAGNOSIS — Z48812 Encounter for surgical aftercare following surgery on the circulatory system: Secondary | ICD-10-CM

## 2013-04-05 DIAGNOSIS — I63239 Cerebral infarction due to unspecified occlusion or stenosis of unspecified carotid arteries: Secondary | ICD-10-CM

## 2013-04-05 NOTE — Progress Notes (Signed)
[facility] [place]  Postoperative Visit  History of Present Illness  Jorge Nixon is a 79 y.o. male who presents for postoperative follow-up for: L CEA (Date: 03/11/13).  The patient's neck incision is healed.  The patient has had no stroke or TIA symptoms.  For VQI Use Only  PRE-ADM LIVING: Home  AMB STATUS: Ambulatory  History   Social History  . Marital Status: Married    Spouse Name: N/A    Number of Children: 1  . Years of Education: N/A   Occupational History  . Retired [employer]    Social History Main Topics  . Smoking status: Never Smoker   . Smokeless tobacco: Never Used  . Alcohol Use: No  . Drug Use: No  . Sexual Activity: Not on file   Other Topics Concern  . Not on file   Social History Narrative  . No narrative on file    Current Outpatient Prescriptions on File Prior to Visit  Medication Sig Dispense Refill  . acetaminophen (TYLENOL) 500 MG tablet Take 500 mg by mouth every 6 (six) hours as needed for pain.       [name] clopidogrel (PLAVIX) 75 MG tablet Take 1 tablet (75 mg total) by mouth daily with breakfast.  30 tablet  0  . feeding supplement (ENSURE COMPLETE) LIQD Take 237 mLs by mouth 2 (two) times daily between meals.  60 Bottle  0  . fish oil-omega-3 fatty acids 1000 MG capsule Take 1 g by mouth daily.       [name] levothyroxine (SYNTHROID, LEVOTHROID) 50 MCG tablet Take 50 mcg by mouth daily before breakfast.      . losartan-hydrochlorothiazide (HYZAAR) 50-12.5 MG per tablet Take 0.5 tablets by mouth daily.      . simvastatin (ZOCOR) 20 MG tablet Take 1 tablet (20 mg total) by mouth daily at 6 PM.  30 tablet  0  . traMADol (ULTRAM) 50 MG tablet Take 1 tablet (50 mg total) by mouth every 6 (six) hours as needed for pain.  30 tablet  0   No current facility-administered medications on file prior to visit.    Physical Examination  Filed Vitals:   04/05/13 1103  BP: 180/62  Pulse:     L Neck: Incision is healed Neuro:  CN 2-12 are intact , Motor strength is 5/5  bilaterally, sensation is grossly intact  Medical Decision Making  Jorge Nixon is a 79 y.o. male who presents s/p L CEA.  The patient's neck incision is healing with no stroke symptoms. I discussed in depth with the patient the nature of atherosclerosis, and emphasized the importance of maximal medical management including strict control of blood pressure, blood glucose, and lipid levels, obtaining regular exercise, anti-platelet use and cessation of smoking.   The patient is currently on an antiplatelet: Plavix and ASA.  I think his aspirin can be held at this point. The patient is currently on a statin: Zocor. The patient is aware that without maximal medical management the underlying atherosclerotic disease process will progress, limiting the benefit of any interventions. The patient's surveillance will included routine carotid duplex studies which will be completed in: 6 months, at which time the patient will be re-evaluated.   I emphasized the importance of routine surveillance of the carotid arteries as recurrence of stenosis is possible, especially with proper management of underlying atherosclerotic disease. The patient agrees to participate in their maximal medical care and routine surveillance.  Thank you for  allowing [place] to participate in this patient's care.  Leonides Sake, MD [facility] [place] Office: [phone] Pager: [phone]

## 2013-04-05 NOTE — Addendum Note (Signed)
Addended by: Sharee Pimple on: 04/05/2013 01:45 PM   Modules accepted: Orders

## 2013-04-10 DIAGNOSIS — M5430 Sciatica, unspecified side: Secondary | ICD-10-CM | POA: Diagnosis not present

## 2013-04-10 DIAGNOSIS — I10 Essential (primary) hypertension: Secondary | ICD-10-CM | POA: Diagnosis not present

## 2013-04-10 DIAGNOSIS — E039 Hypothyroidism, unspecified: Secondary | ICD-10-CM | POA: Diagnosis not present

## 2013-04-10 DIAGNOSIS — J069 Acute upper respiratory infection, unspecified: Secondary | ICD-10-CM | POA: Diagnosis not present

## 2013-04-12 DIAGNOSIS — E039 Hypothyroidism, unspecified: Secondary | ICD-10-CM | POA: Diagnosis not present

## 2013-04-12 DIAGNOSIS — I6529 Occlusion and stenosis of unspecified carotid artery: Secondary | ICD-10-CM | POA: Diagnosis not present

## 2013-05-01 ENCOUNTER — Ambulatory Visit: Payer: Self-pay | Admitting: Nurse Practitioner

## 2013-05-07 ENCOUNTER — Telehealth: Payer: Self-pay | Admitting: *Deleted

## 2013-05-07 NOTE — Telephone Encounter (Signed)
Called and LMTCB re; Jorge Nixon possible complications of Plavix.  [facility] last saw on 04-05-13 and continued Plavix d/c'd ASA 81mg .  Next appt is on 10-04-13 with [facility].    Will talk to [name] about specific complaints whenever she calls back.

## 2013-05-07 NOTE — Telephone Encounter (Signed)
Mr. Haggar called back stated that he believes the Plavix is causing increased BP, fatigue and upset stomach since his last office visit with Dr. Imogene Burn on 04-05-13.  Staff msg sent to Dr. Imogene Burn re: stopping Plavix and starting ASA 81mg .

## 2013-05-08 NOTE — Telephone Encounter (Signed)
Fransisco Hertz, MD Sharee Pimple, CMA            Ok to stop plavix. But start aspirin      Previous Messages      ----- Message -----  From: Sharee Pimple, CMA  Sent: 05/07/2013 4:14 PM  To: Fransisco Hertz, MD  Subject: Stopping Plavix   Patient called saying Plavix is causing increased fatigue, BP problems, upset stomach since you saw him on 04-05-13. Can he stop Plavix and just take ASA 81mg  now? He has no rash or other problems.   Joyce Gross        I contacted Mr. Dowis and told him to stop Plavix and resume his ASA 81mg  as per Dr. Nicky Pugh staff message.

## 2013-05-26 IMAGING — CR DG PORTABLE PELVIS
1 series · 1 of 1 positions shown · non-contrast
Comparison: None.

CLINICAL DATA: Postop right total hip prosthesis

PORTABLE PELVIS

[view not recorded]
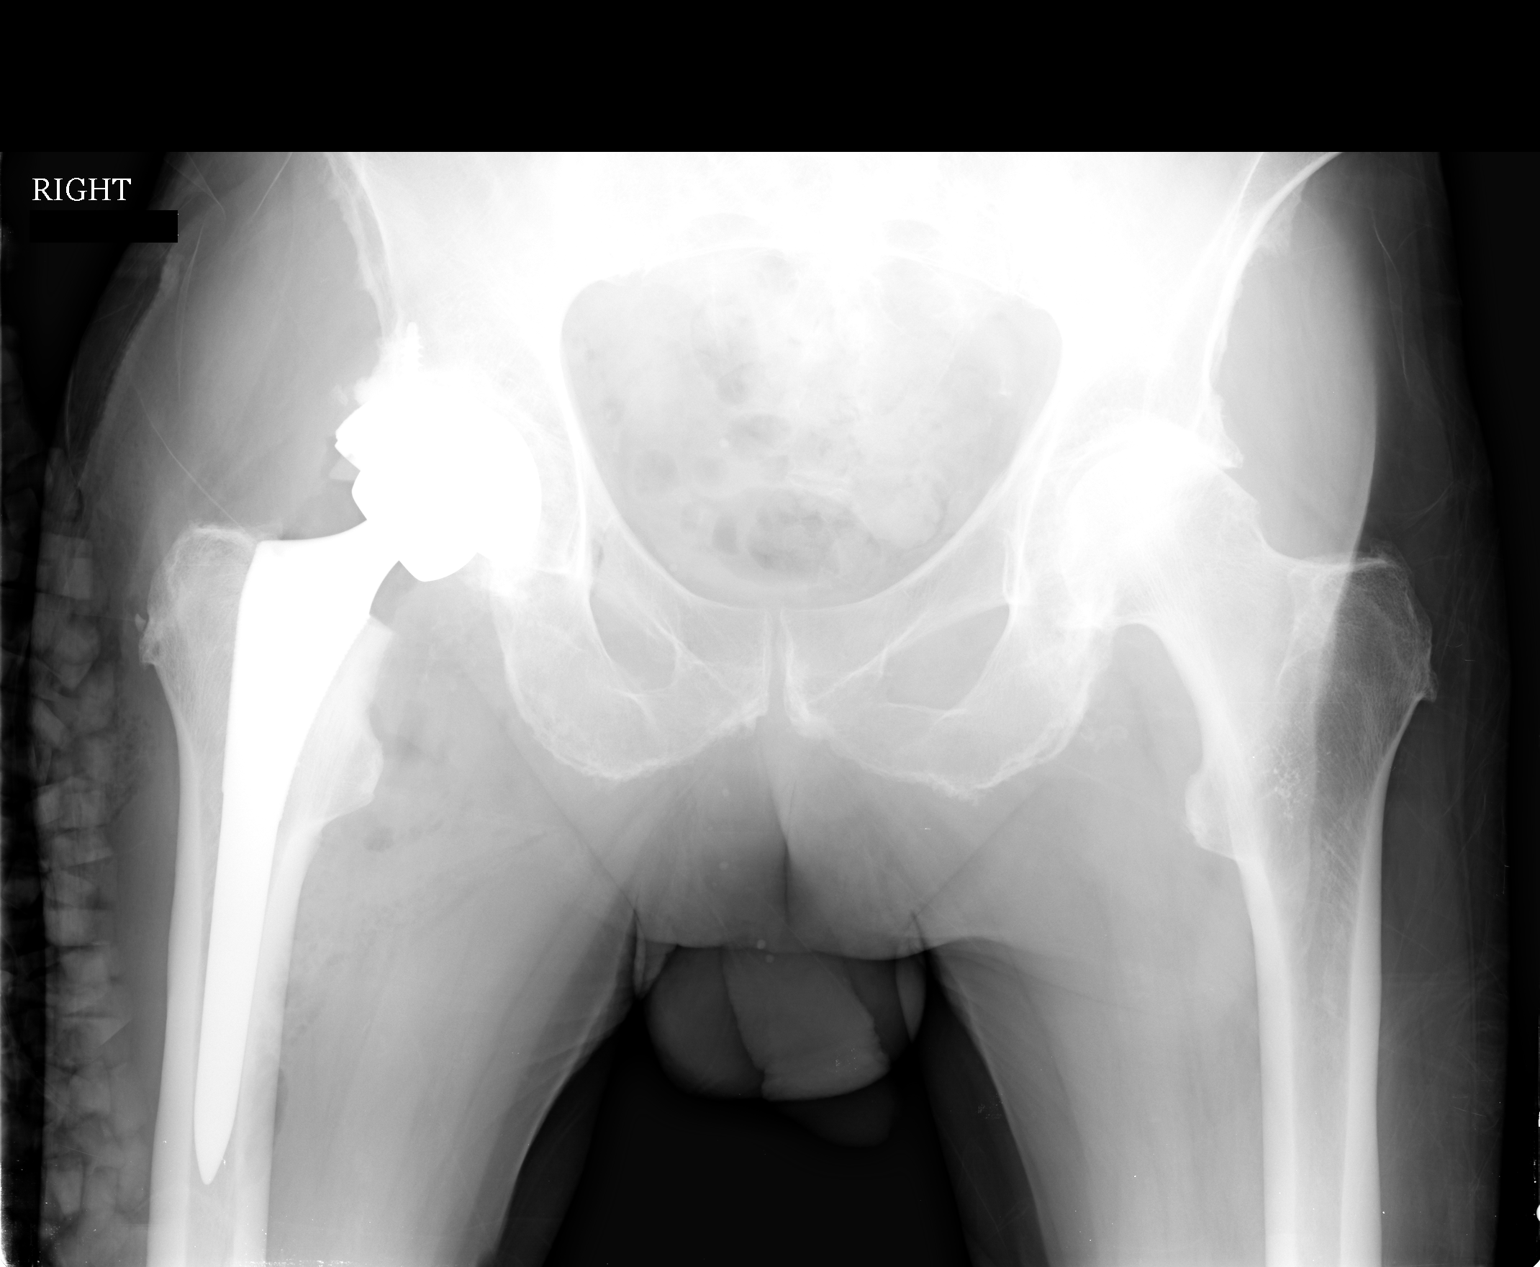

[1 of 1 positions shown; findings below may reference images not displayed]

FINDINGS: A right total hip prosthesis is now in place in anatomic
alignment.  There is no evidence of acute fracture or dislocation.
Postsurgical drains and gas are present.
IMPRESSION: New right total hip prosthesis with no evidence of acute
abnormality or hardware complication.

## 2013-05-26 IMAGING — CR DG HIP 1V PORT*R*
1 series · 1 of 1 positions shown · non-contrast
Comparison: None.

CLINICAL DATA: Hip replacement.

PORTABLE RIGHT HIP - 1 VIEW

[view not recorded]
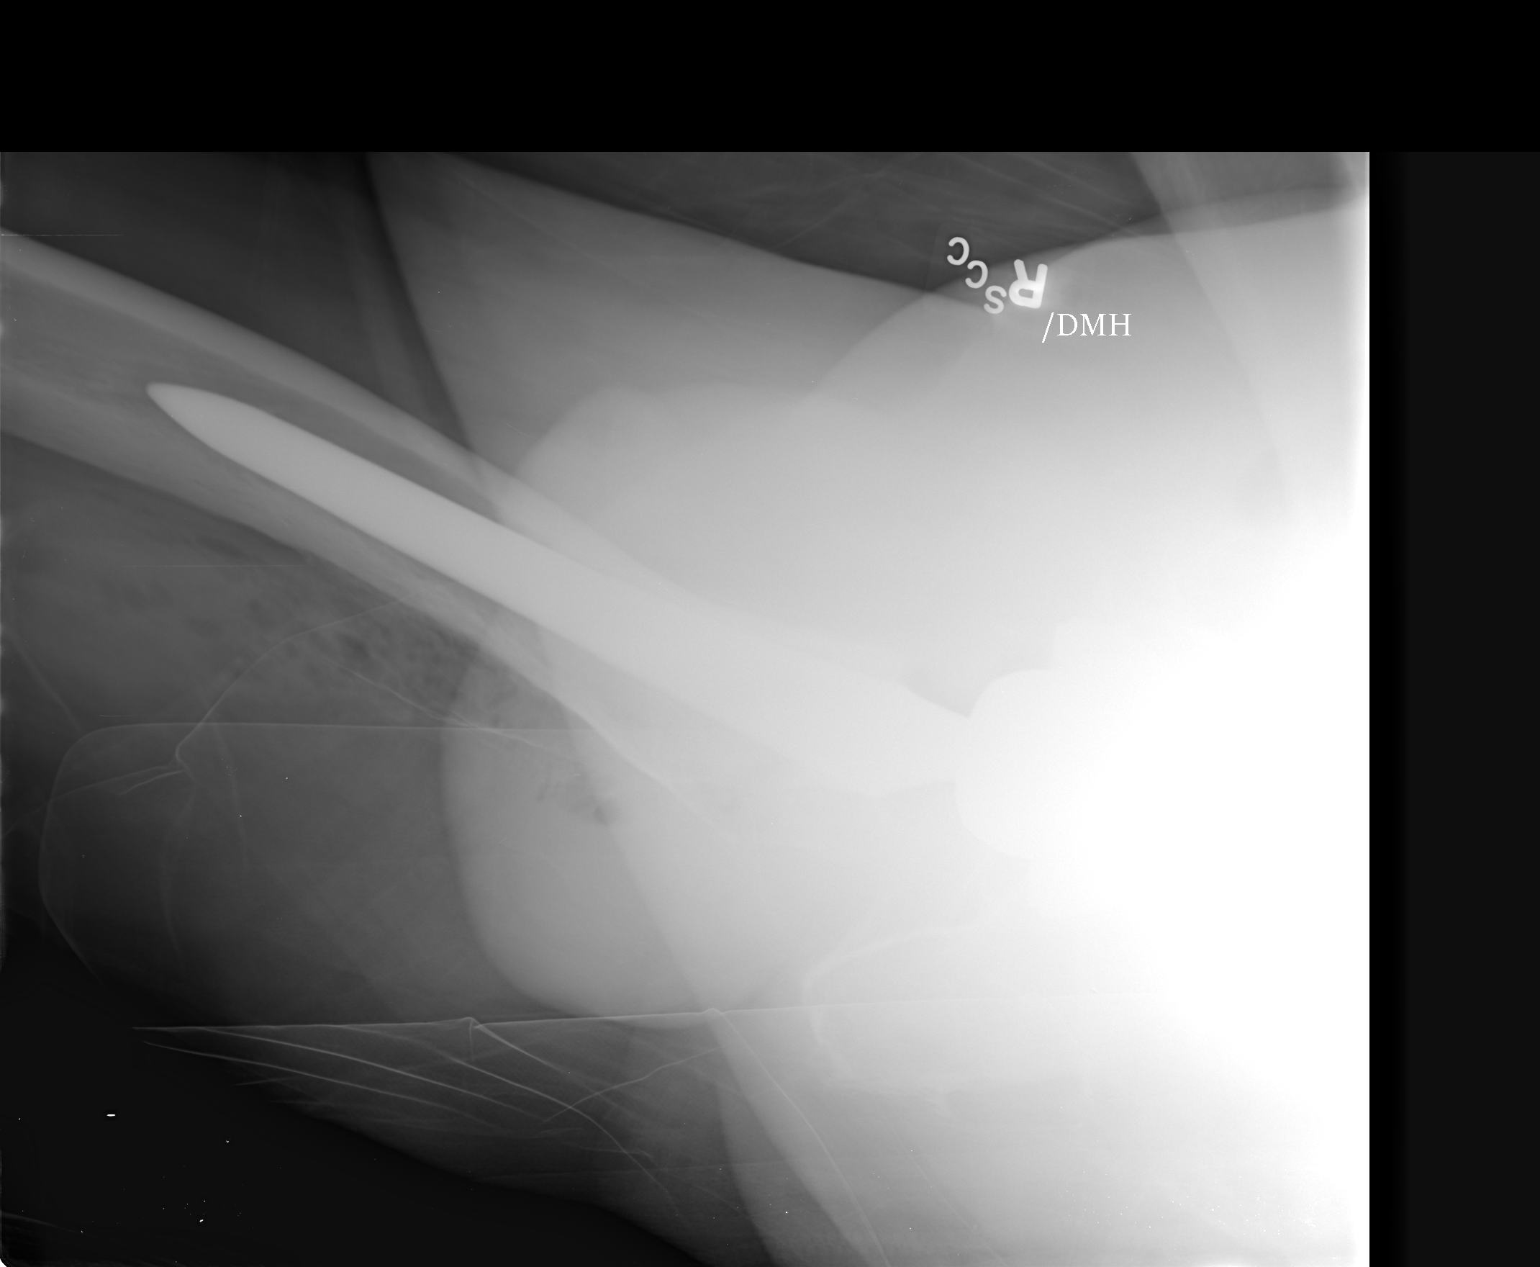

[1 of 1 positions shown; findings below may reference images not displayed]

FINDINGS: Cross-table lateral view demonstrates a right total hip
arthroplasty in place.  The device appears located.  No fracture.
Gas in the soft tissues from surgery noted.
IMPRESSION: Right total hip without evidence of complication.

## 2013-07-30 DIAGNOSIS — D485 Neoplasm of uncertain behavior of skin: Secondary | ICD-10-CM | POA: Diagnosis not present

## 2013-07-30 DIAGNOSIS — L821 Other seborrheic keratosis: Secondary | ICD-10-CM | POA: Diagnosis not present

## 2013-07-30 DIAGNOSIS — L570 Actinic keratosis: Secondary | ICD-10-CM | POA: Diagnosis not present

## 2013-08-23 DIAGNOSIS — M25569 Pain in unspecified knee: Secondary | ICD-10-CM | POA: Diagnosis not present

## 2013-09-12 DIAGNOSIS — D235 Other benign neoplasm of skin of trunk: Secondary | ICD-10-CM | POA: Diagnosis not present

## 2013-09-12 DIAGNOSIS — C44519 Basal cell carcinoma of skin of other part of trunk: Secondary | ICD-10-CM | POA: Diagnosis not present

## 2013-09-12 DIAGNOSIS — C44611 Basal cell carcinoma of skin of unspecified upper limb, including shoulder: Secondary | ICD-10-CM | POA: Diagnosis not present

## 2013-09-12 DIAGNOSIS — L570 Actinic keratosis: Secondary | ICD-10-CM | POA: Diagnosis not present

## 2013-09-17 DIAGNOSIS — H2589 Other age-related cataract: Secondary | ICD-10-CM | POA: Diagnosis not present

## 2013-09-17 DIAGNOSIS — H43399 Other vitreous opacities, unspecified eye: Secondary | ICD-10-CM | POA: Diagnosis not present

## 2013-09-17 DIAGNOSIS — H52229 Regular astigmatism, unspecified eye: Secondary | ICD-10-CM | POA: Diagnosis not present

## 2013-09-17 DIAGNOSIS — H524 Presbyopia: Secondary | ICD-10-CM | POA: Diagnosis not present

## 2013-09-17 DIAGNOSIS — H5200 Hypermetropia, unspecified eye: Secondary | ICD-10-CM | POA: Diagnosis not present

## 2013-09-30 DIAGNOSIS — C44519 Basal cell carcinoma of skin of other part of trunk: Secondary | ICD-10-CM | POA: Diagnosis not present

## 2013-09-30 DIAGNOSIS — L570 Actinic keratosis: Secondary | ICD-10-CM | POA: Diagnosis not present

## 2013-10-03 ENCOUNTER — Other Ambulatory Visit: Payer: Self-pay | Admitting: Urology

## 2013-10-03 ENCOUNTER — Encounter (HOSPITAL_COMMUNITY): Payer: Self-pay | Admitting: Emergency Medicine

## 2013-10-03 ENCOUNTER — Emergency Department (HOSPITAL_COMMUNITY)
Admission: EM | Admit: 2013-10-03 | Discharge: 2013-10-03 | Disposition: A | Discharge status: Home / Self Care | Payer: Medicare Other | Attending: Emergency Medicine | Admitting: Emergency Medicine

## 2013-10-03 ENCOUNTER — Emergency Department (HOSPITAL_COMMUNITY): Payer: Medicare Other

## 2013-10-03 DIAGNOSIS — I10 Essential (primary) hypertension: Secondary | ICD-10-CM | POA: Insufficient documentation

## 2013-10-03 DIAGNOSIS — Z7982 Long term (current) use of aspirin: Secondary | ICD-10-CM | POA: Insufficient documentation

## 2013-10-03 DIAGNOSIS — I2510 Atherosclerotic heart disease of native coronary artery without angina pectoris: Secondary | ICD-10-CM | POA: Diagnosis not present

## 2013-10-03 DIAGNOSIS — Z8701 Personal history of pneumonia (recurrent): Secondary | ICD-10-CM | POA: Insufficient documentation

## 2013-10-03 DIAGNOSIS — R112 Nausea with vomiting, unspecified: Secondary | ICD-10-CM | POA: Insufficient documentation

## 2013-10-03 DIAGNOSIS — Z862 Personal history of diseases of the blood and blood-forming organs and certain disorders involving the immune mechanism: Secondary | ICD-10-CM | POA: Insufficient documentation

## 2013-10-03 DIAGNOSIS — Z79899 Other long term (current) drug therapy: Secondary | ICD-10-CM | POA: Diagnosis not present

## 2013-10-03 DIAGNOSIS — N201 Calculus of ureter: Secondary | ICD-10-CM | POA: Diagnosis not present

## 2013-10-03 DIAGNOSIS — Z8673 Personal history of transient ischemic attack (TIA), and cerebral infarction without residual deficits: Secondary | ICD-10-CM | POA: Diagnosis not present

## 2013-10-03 DIAGNOSIS — J449 Chronic obstructive pulmonary disease, unspecified: Secondary | ICD-10-CM | POA: Diagnosis not present

## 2013-10-03 DIAGNOSIS — M129 Arthropathy, unspecified: Secondary | ICD-10-CM | POA: Insufficient documentation

## 2013-10-03 DIAGNOSIS — Z7902 Long term (current) use of antithrombotics/antiplatelets: Secondary | ICD-10-CM | POA: Diagnosis not present

## 2013-10-03 DIAGNOSIS — N281 Cyst of kidney, acquired: Secondary | ICD-10-CM | POA: Diagnosis not present

## 2013-10-03 DIAGNOSIS — Z8719 Personal history of other diseases of the digestive system: Secondary | ICD-10-CM | POA: Insufficient documentation

## 2013-10-03 DIAGNOSIS — J4489 Other specified chronic obstructive pulmonary disease: Secondary | ICD-10-CM | POA: Insufficient documentation

## 2013-10-03 DIAGNOSIS — E039 Hypothyroidism, unspecified: Secondary | ICD-10-CM | POA: Diagnosis not present

## 2013-10-03 DIAGNOSIS — N200 Calculus of kidney: Secondary | ICD-10-CM | POA: Diagnosis not present

## 2013-10-03 DIAGNOSIS — N1330 Unspecified hydronephrosis: Secondary | ICD-10-CM | POA: Diagnosis not present

## 2013-10-03 LAB — CBC WITH DIFFERENTIAL/PLATELET
BASOS ABS: 0 10*3/uL (ref 0.0–0.1)
BASOS PCT: 0 % (ref 0–1)
EOS PCT: 1 % (ref 0–5)
Eosinophils Absolute: 0.1 10*3/uL (ref 0.0–0.7)
HEMATOCRIT: 36.3 % — AB (ref 39.0–52.0)
HEMOGLOBIN: 12.6 g/dL — AB (ref 13.0–17.0)
Lymphocytes Relative: 10 % — ABNORMAL LOW (ref 12–46)
Lymphs Abs: 1 10*3/uL (ref 0.7–4.0)
MCH: 30.3 pg (ref 26.0–34.0)
MCHC: 34.7 g/dL (ref 30.0–36.0)
MCV: 87.3 fL (ref 78.0–100.0)
MONOS PCT: 7 % (ref 3–12)
Monocytes Absolute: 0.7 10*3/uL (ref 0.1–1.0)
Neutro Abs: 8 10*3/uL — ABNORMAL HIGH (ref 1.7–7.7)
Neutrophils Relative %: 82 % — ABNORMAL HIGH (ref 43–77)
Platelets: 149 10*3/uL — ABNORMAL LOW (ref 150–400)
RBC: 4.16 MIL/uL — ABNORMAL LOW (ref 4.22–5.81)
RDW: 13.9 % (ref 11.5–15.5)
WBC: 9.7 10*3/uL (ref 4.0–10.5)

## 2013-10-03 LAB — URINE MICROSCOPIC-ADD ON

## 2013-10-03 LAB — URINALYSIS, ROUTINE W REFLEX MICROSCOPIC
BILIRUBIN URINE: NEGATIVE
GLUCOSE, UA: NEGATIVE mg/dL
Leukocytes, UA: NEGATIVE
Nitrite: NEGATIVE
PROTEIN: NEGATIVE mg/dL
Specific Gravity, Urine: 1.02 (ref 1.005–1.030)
Urobilinogen, UA: 0.2 mg/dL (ref 0.0–1.0)
pH: 7.5 (ref 5.0–8.0)

## 2013-10-03 LAB — BASIC METABOLIC PANEL
BUN: 21 mg/dL (ref 6–23)
CALCIUM: 9.8 mg/dL (ref 8.4–10.5)
CHLORIDE: 97 meq/L (ref 96–112)
CO2: 26 mEq/L (ref 19–32)
Creatinine, Ser: 1.28 mg/dL (ref 0.50–1.35)
GFR calc non Af Amer: 49 mL/min — ABNORMAL LOW (ref >90)
GFR, EST AFRICAN AMERICAN: 57 mL/min — AB (ref >90)
Glucose, Bld: 107 mg/dL — ABNORMAL HIGH (ref 70–99)
Potassium: 3.3 mEq/L — ABNORMAL LOW (ref 3.7–5.3)
Sodium: 139 mEq/L (ref 137–147)

## 2013-10-03 LAB — LACTIC ACID, PLASMA: LACTIC ACID, VENOUS: 1.3 mmol/L (ref 0.5–2.2)

## 2013-10-03 MED ORDER — MORPHINE SULFATE 4 MG/ML IJ SOLN
4.0000 mg | Freq: Once | INTRAMUSCULAR | Status: AC
  Administered 2013-10-03: 4 mg via INTRAVENOUS
  Filled 2013-10-03: qty 1

## 2013-10-03 MED ORDER — CEPHALEXIN 500 MG PO CAPS: 500.0000 mg | ORAL_CAPSULE | Freq: Four times a day (QID) | ORAL | Status: DC

## 2013-10-03 MED ORDER — ONDANSETRON HCL 4 MG PO TABS: 4.0000 mg | ORAL_TABLET | Freq: Four times a day (QID) | ORAL | Status: DC | PRN

## 2013-10-03 MED ORDER — TAMSULOSIN HCL 0.4 MG PO CAPS: 0.4000 mg | ORAL_CAPSULE | Freq: Every day | ORAL | Status: DC

## 2013-10-03 MED ORDER — ONDANSETRON HCL 4 MG/2ML IJ SOLN
4.0000 mg | Freq: Once | INTRAMUSCULAR | Status: AC
  Administered 2013-10-03: 4 mg via INTRAVENOUS
  Filled 2013-10-03: qty 2

## 2013-10-03 MED ORDER — CEPHALEXIN 500 MG PO CAPS
1000.0000 mg | ORAL_CAPSULE | Freq: Once | ORAL | Status: AC
  Administered 2013-10-03: 1000 mg via ORAL
  Filled 2013-10-03: qty 2

## 2013-10-03 MED ORDER — OXYCODONE-ACETAMINOPHEN 5-325 MG PO TABS: 1.0000 | ORAL_TABLET | ORAL | Status: DC | PRN

## 2013-10-03 MED ORDER — SODIUM CHLORIDE 0.9 % IV SOLN
Freq: Once | INTRAVENOUS | Status: AC
  Administered 2013-10-03: 1000 mL via INTRAVENOUS

## 2013-10-03 NOTE — ED Notes (Signed)
Pt reports pain to left flank and into left groin, states is started just a few hours ago, is nauseated, no vomiting yet.

## 2013-10-03 NOTE — ED Notes (Signed)
Pt reports left flank pain that started tonight. Pt states unable to get comfortable, pt has had some N/V.

## 2013-10-03 NOTE — Discharge Instructions (Signed)
Kidney Stones Kidney stones (urolithiasis) are deposits that form inside your kidneys. The intense pain is caused by the stone moving through the urinary tract. When the stone moves, the ureter goes into spasm around the stone. The stone is usually passed in the urine.  CAUSES   A disorder that makes certain neck glands produce too much parathyroid hormone (primary hyperparathyroidism).  A buildup of uric acid crystals, similar to gout in your joints.  Narrowing (stricture) of the ureter.  A kidney obstruction present at birth (congenital obstruction).  Previous surgery on the kidney or ureters.  Numerous kidney infections. SYMPTOMS   Feeling sick to your stomach (nauseous).  Throwing up (vomiting).  Blood in the urine (hematuria).  Pain that usually spreads (radiates) to the groin.  Frequency or urgency of urination. DIAGNOSIS   Taking a history and physical exam.  Blood or urine tests.  CT scan.  Occasionally, an examination of the inside of the urinary bladder (cystoscopy) is performed. TREATMENT   Observation.  Increasing your fluid intake.  Extracorporeal shock wave lithotripsy This is a noninvasive procedure that uses shock waves to break up kidney stones.  Surgery may be needed if you have severe pain or persistent obstruction. There are various surgical procedures. Most of the procedures are performed with the use of small instruments. Only small incisions are needed to accommodate these instruments, so recovery time is minimized. The size, location, and chemical composition are all important variables that will determine the proper choice of action for you. Talk to your health care provider to better understand your situation so that you will minimize the risk of injury to yourself and your kidney.  HOME CARE INSTRUCTIONS   Drink enough water and fluids to keep your urine clear or pale yellow. This will help you to pass the stone or stone fragments.  Strain  all urine through the provided strainer. Keep all particulate matter and stones for your health care provider to see. The stone causing the pain may be as small as a grain of salt. It is very important to use the strainer each and every time you pass your urine. The collection of your stone will allow your health care provider to analyze it and verify that a stone has actually passed. The stone analysis will often identify what you can do to reduce the incidence of recurrences.  Only take over-the-counter or prescription medicines for pain, discomfort, or fever as directed by your health care provider.  Make a follow-up appointment with your health care provider as directed.  Get follow-up X-rays if required. The absence of pain does not always mean that the stone has passed. It may have only stopped moving. If the urine remains completely obstructed, it can cause loss of kidney function or even complete destruction of the kidney. It is your responsibility to make sure X-rays and follow-ups are completed. Ultrasounds of the kidney can show blockages and the status of the kidney. Ultrasounds are not associated with any radiation and can be performed easily in a matter of minutes. SEEK MEDICAL CARE IF:  You experience pain that is progressive and unresponsive to any pain medicine you have been prescribed. SEEK IMMEDIATE MEDICAL CARE IF:   Pain cannot be controlled with the prescribed medicine.  You have a fever or shaking chills.  The severity or intensity of pain increases over 18 hours and is not relieved by pain medicine.  You develop a new onset of abdominal pain.  You feel faint or pass  out.  You are unable to urinate. MAKE SURE YOU:   Understand these instructions.  Will watch your condition.  Will get help right away if you are not doing well or get worse. Document Released: 05/16/2005 Document Revised: 01/16/2013 Document Reviewed: 10/17/2012 [facility] Patient Information 2014  [place].  Tamsulosin capsules What is this medicine? TAMSULOSIN (tam SOO loe sin) is used to treat enlargement of the prostate gland in men, a condition called benign prostatic hyperplasia or BPH. It is not for use in women. It works by relaxing muscles in the prostate and bladder neck. This improves urine flow and reduces BPH symptoms. This medicine may be used for other purposes; ask your health care provider or pharmacist if you have questions. COMMON BRAND NAME(S): Flomax What should I tell my health care provider before I take this medicine? They need to know if you have any of the following conditions: -advanced kidney disease -advanced liver disease -low blood pressure -prostate cancer -an unusual or allergic reaction to tamsulosin, sulfa drugs, other medicines, foods, dyes, or preservatives -pregnant or trying to get pregnant -breast-feeding How should I use this medicine? Take this medicine by mouth about 30 minutes after the same meal every day. Follow the directions on the prescription label. Swallow the capsules whole with a glass of water. Do not crush, chew, or open capsules. Do not take your medicine more often than directed. Do not stop taking your medicine unless your doctor tells you to. Talk to your pediatrician regarding the use of this medicine in children. Special care may be needed. Overdosage: If you think you have taken too much of this medicine contact a poison control center or emergency room at once. NOTE: This medicine is only for you. Do not share this medicine with others. What if I miss a dose? If you miss a dose, take it as soon as you can. If it is almost time for your next dose, take only that dose. Do not take double or extra doses. If you stop taking your medicine for several days or more, ask your doctor or health care professional what dose you should start back on. What may interact with this  medicine? -cimetidine -fluoxetine -ketoconazole -medicines for erectile disfunction like sildenafil, tadalafil, vardenafil -medicines for high blood pressure -other alpha-blockers like alfuzosin, doxazosin, phentolamine, phenoxybenzamine, prazosin, terazosin -warfarin This list may not describe all possible interactions. Give your health care provider a list of all the medicines, herbs, non-prescription drugs, or dietary supplements you use. Also tell them if you smoke, drink alcohol, or use illegal drugs. Some items may interact with your medicine. What should I watch for while using this medicine? Visit your doctor or health care professional for regular check ups. You will need lab work done before you start this medicine and regularly while you are taking it. Check your blood pressure as directed. Ask your health care professional what your blood pressure should be, and when you should contact him or her. This medicine may make you feel dizzy or lightheaded. This is more likely to happen after the first dose, after an increase in dose, or during hot weather or exercise. Drinking alcohol and taking some medicines can make this worse. Do not drive, use machinery, or do anything that needs mental alertness until you know how this medicine affects you. Do not sit or stand up quickly. If you begin to feel dizzy, sit down until you feel better. These effects can decrease once your body adjusts to the  medicine. Contact your doctor or health care professional right away if you have an erection that lasts longer than 4 hours or if it becomes painful. This may be a sign of a serious problem and must be treated right away to prevent permanent damage. If you are thinking of having cataract surgery, tell your eye surgeon that you have taken this medicine. What side effects may I notice from receiving this medicine? Side effects that you should report to your doctor or health care professional as soon as  possible: -allergic reactions like skin rash or itching, hives, swelling of the lips, mouth, tongue, or throat -breathing problems -change in vision -feeling faint or lightheaded -irregular heartbeat -prolonged or painful erection -weakness Side effects that usually do not require medical attention (report to your doctor or health care professional if they continue or are bothersome): -back pain -change in sex drive or performance -constipation, nausea or vomiting -cough -drowsy -runny or stuffy nose -trouble sleeping This list may not describe all possible side effects. Call your doctor for medical advice about side effects. You may report side effects to FDA at 1-800-FDA-1088. Where should I keep my medicine? Keep out of the reach of children. Store at room temperature between 15 and 30 degrees C (59 and 86 degrees F). Throw away any unused medicine after the expiration date. NOTE: This sheet is a summary. It may not cover all possible information. If you have questions about this medicine, talk to your doctor, pharmacist, or health care provider.  2014, [employer] Standard. (2012-05-16 14:11:34)  Cephalexin tablets or capsules What is this medicine? CEPHALEXIN (sef a LEX in) is a cephalosporin antibiotic. It is used to treat certain kinds of bacterial infections It will not work for colds, flu, or other viral infections. This medicine may be used for other purposes; ask your health care provider or pharmacist if you have questions. COMMON BRAND NAME(S): Biocef, Keflex, Keftab What should I tell my health care provider before I take this medicine? They need to know if you have any of these conditions: -kidney disease -stomach or intestine problems, especially colitis -an unusual or allergic reaction to cephalexin, other cephalosporins, penicillins, other antibiotics, medicines, foods, dyes or preservatives -pregnant or trying to get pregnant -breast-feeding How should I use  this medicine? Take this medicine by mouth with a full glass of water. Follow the directions on the prescription label. This medicine can be taken with or without food. Take your medicine at regular intervals. Do not take your medicine more often than directed. Take all of your medicine as directed even if you think you are better. Do not skip doses or stop your medicine early. Talk to your pediatrician regarding the use of this medicine in children. While this drug may be prescribed for selected conditions, precautions do apply. Overdosage: If you think you have taken too much of this medicine contact a poison control center or emergency room at once. NOTE: This medicine is only for you. Do not share this medicine with others. What if I miss a dose? If you miss a dose, take it as soon as you can. If it is almost time for your next dose, take only that dose. Do not take double or extra doses. There should be at least 4 to 6 hours between doses. What may interact with this medicine? -probenecid -some other antibiotics This list may not describe all possible interactions. Give your health care provider a list of all the medicines, herbs, non-prescription drugs, or dietary  supplements you use. Also tell them if you smoke, drink alcohol, or use illegal drugs. Some items may interact with your medicine. What should I watch for while using this medicine? Tell your doctor or health care professional if your symptoms do not begin to improve in a few days. Do not treat diarrhea with over the counter products. Contact your doctor if you have diarrhea that lasts more than 2 days or if it is severe and watery. If you have diabetes, you may get a false-positive result for sugar in your urine. Check with your doctor or health care professional. What side effects may I notice from receiving this medicine? Side effects that you should report to your doctor or health care professional as soon as possible: -allergic  reactions like skin rash, itching or hives, swelling of the face, lips, or tongue -breathing problems -pain or trouble passing urine -redness, blistering, peeling or loosening of the skin, including inside the mouth -severe or watery diarrhea -unusually weak or tired -yellowing of the eyes, skin Side effects that usually do not require medical attention (report to your doctor or health care professional if they continue or are bothersome): -gas or heartburn -genital or anal irritation -headache -joint or muscle pain -nausea, vomiting This list may not describe all possible side effects. Call your doctor for medical advice about side effects. You may report side effects to FDA at 1-800-FDA-1088. Where should I keep my medicine? Keep out of the reach of children. Store at room temperature between 59 and 86 degrees F (15 and 30 degrees C). Throw away any unused medicine after the expiration date. NOTE: This sheet is a summary. It may not cover all possible information. If you have questions about this medicine, talk to your doctor, pharmacist, or health care provider.  2014, [employer] Standard. (2007-08-20 17:09:13)  Acetaminophen; Oxycodone tablets What is this medicine? ACETAMINOPHEN; OXYCODONE (a set a MEE noe fen; ox i KOE done) is a pain reliever. It is used to treat mild to moderate pain. This medicine may be used for other purposes; ask your health care provider or pharmacist if you have questions. COMMON BRAND NAME(S): Endocet, Magnacet, Narvox, Percocet, Perloxx, Primalev, Primlev, Roxicet, Xolox What should I tell my health care provider before I take this medicine? They need to know if you have any of these conditions: -brain tumor -Crohn's disease, inflammatory bowel disease, or ulcerative colitis -drug abuse or addiction -head injury -heart or circulation problems -if you often drink alcohol -kidney disease or problems going to the bathroom -liver disease -lung  disease, asthma, or breathing problems -an unusual or allergic reaction to acetaminophen, oxycodone, other opioid analgesics, other medicines, foods, dyes, or preservatives -pregnant or trying to get pregnant -breast-feeding How should I use this medicine? Take this medicine by mouth with a full glass of water. Follow the directions on the prescription label. Take your medicine at regular intervals. Do not take your medicine more often than directed. Talk to your pediatrician regarding the use of this medicine in children. Special care may be needed. Patients over 63 years old may have a stronger reaction and need a smaller dose. Overdosage: If you think you have taken too much of this medicine contact a poison control center or emergency room at once. NOTE: This medicine is only for you. Do not share this medicine with others. What if I miss a dose? If you miss a dose, take it as soon as you can. If it is almost time for your next  dose, take only that dose. Do not take double or extra doses. What may interact with this medicine? -alcohol -antihistamines -barbiturates like amobarbital, butalbital, butabarbital, methohexital, pentobarbital, phenobarbital, thiopental, and secobarbital -benztropine -drugs for bladder problems like solifenacin, trospium, oxybutynin, tolterodine, hyoscyamine, and methscopolamine -drugs for breathing problems like ipratropium and tiotropium -drugs for certain stomach or intestine problems like propantheline, homatropine methylbromide, glycopyrrolate, atropine, belladonna, and dicyclomine -general anesthetics like etomidate, ketamine, nitrous oxide, propofol, desflurane, enflurane, halothane, isoflurane, and sevoflurane -medicines for depression, anxiety, or psychotic disturbances -medicines for sleep -muscle relaxants -naltrexone -narcotic medicines (opiates) for pain -phenothiazines like perphenazine, thioridazine, chlorpromazine, mesoridazine, fluphenazine,  prochlorperazine, promazine, and trifluoperazine -scopolamine -tramadol -trihexyphenidyl This list may not describe all possible interactions. Give your health care provider a list of all the medicines, herbs, non-prescription drugs, or dietary supplements you use. Also tell them if you smoke, drink alcohol, or use illegal drugs. Some items may interact with your medicine. What should I watch for while using this medicine? Tell your doctor or health care professional if your pain does not go away, if it gets worse, or if you have new or a different type of pain. You may develop tolerance to the medicine. Tolerance means that you will need a higher dose of the medication for pain relief. Tolerance is normal and is expected if you take this medicine for a long time. Do not suddenly stop taking your medicine because you may develop a severe reaction. Your body becomes used to the medicine. This does NOT mean you are addicted. Addiction is a behavior related to getting and using a drug for a non-medical reason. If you have pain, you have a medical reason to take pain medicine. Your doctor will tell you how much medicine to take. If your doctor wants you to stop the medicine, the dose will be slowly lowered over time to avoid any side effects. You may get drowsy or dizzy. Do not drive, use machinery, or do anything that needs mental alertness until you know how this medicine affects you. Do not stand or sit up quickly, especially if you are an older patient. This reduces the risk of dizzy or fainting spells. Alcohol may interfere with the effect of this medicine. Avoid alcoholic drinks. There are different types of narcotic medicines (opiates) for pain. If you take more than one type at the same time, you may have more side effects. Give your health care provider a list of all medicines you use. Your doctor will tell you how much medicine to take. Do not take more medicine than directed. Call emergency for help  if you have problems breathing. The medicine will cause constipation. Try to have a bowel movement at least every 2 to 3 days. If you do not have a bowel movement for 3 days, call your doctor or health care professional. Do not take Tylenol (acetaminophen) or medicines that have acetaminophen with this medicine. Too much acetaminophen can be very dangerous. Many nonprescription medicines contain acetaminophen. Always read the labels carefully to avoid taking more acetaminophen. What side effects may I notice from receiving this medicine? Side effects that you should report to your doctor or health care professional as soon as possible: -allergic reactions like skin rash, itching or hives, swelling of the face, lips, or tongue -breathing difficulties, wheezing -confusion -light headedness or fainting spells -severe stomach pain -unusually weak or tired -yellowing of the skin or the whites of the eyes  Side effects that usually do not require medical attention (  report to your doctor or health care professional if they continue or are bothersome): -dizziness -drowsiness -nausea -vomiting This list may not describe all possible side effects. Call your doctor for medical advice about side effects. You may report side effects to FDA at 1-800-FDA-1088. Where should I keep my medicine? Keep out of the reach of children. This medicine can be abused. Keep your medicine in a safe place to protect it from theft. Do not share this medicine with anyone. Selling or giving away this medicine is dangerous and against the law. Store at room temperature between 20 and 25 degrees C (68 and 77 degrees F). Keep container tightly closed. Protect from light. This medicine may cause accidental overdose and death if it is taken by other adults, children, or pets. Flush any unused medicine down the toilet to reduce the chance of harm. Do not use the medicine after the expiration date. NOTE: This sheet is a summary. It  may not cover all possible information. If you have questions about this medicine, talk to your doctor, pharmacist, or health care provider.  2014, [employer] Standard. (2013-01-07 13:17:35)

## 2013-10-03 NOTE — ED Notes (Signed)
Pt alert & oriented x4, stable gait. Patient  given discharge instructions, paperwork & prescription(s). Patient verbalized understanding. Pt left department w/ no further questions. 

## 2013-10-03 NOTE — ED Provider Notes (Signed)
CSN: [id number]     Arrival date & time 10/03/13  0225 History   First MD Initiated Contact with Patient 10/03/13 [phone]     Chief complaint: Flank pain  (Consider location/radiation/quality/duration/timing/severity/associated sxs/prior Treatment) The history is provided by the patient.   80 year old male had onset at about midnight of pain in the left flank with radiation to the left lower abdomen. Pain is severe and he rates it 8/10. Nothing makes it better nothing makes it worse. There is associated nausea without vomiting. There's been no fever, chills, sweats. He denies urinary urgency, frequency, dysuria. He has not taken anything to help his pain. He is concerned that it may be a kidney stone.  Past Medical History  Diagnosis Date  . Hypertension   . Diverticular disease 02/14/2011    problems swallowing  . GERD (gastroesophageal reflux disease)   . Coronary artery disease   . Kidney stone   . Hypothyroidism   . Anemia   . COPD (chronic obstructive pulmonary disease)   . Non-ischemic cardiomyopathy     mild  EF on echo 05/03/2012 55 to 60%  . Nephrolithiasis     lithotripsy 2006  . BPH (benign prostatic hyperplasia)   . TIA (transient ischemic attack)   . Shortness of breath     exertion  . Headache(784.0)   . Dizziness   . Arthritis   . History of blood transfusion   . Pneumonia     hx of - hospitalized  . Carotid artery occlusion    Past Surgical History  Procedure Laterality Date  . Total hip arthroplasty Right august 6th 2012    right side   . Replacement total knee Left 2006    left knee  . Transurethral resection of prostate  1996  . Knee arthroscopy Right 2010  . Joint replacement Right     hip  . Joint replacement Left     knee  . Carpel tunnel Right   . Endarterectomy Left 03/11/2013    Procedure: ENDARTERECTOMY CAROTID with Vascu-Guard Bovine Patch.;  Surgeon: Conrad Rockingham, MD;  Location: [facility];  Service: Vascular;  Laterality: Left;  . Carotid  endarterectomy     Family History  Problem Relation Age of Onset  . Stroke Father   . Hypertension    . Diabetes     History  Substance Use Topics  . Smoking status: Never Smoker   . Smokeless tobacco: Never Used  . Alcohol Use: No    Review of Systems  All other systems reviewed and are negative.     Allergies  Sulfa antibiotics and Milk-related compounds  Home Medications   Prior to Admission medications   Medication Sig Start Date End Date Taking? Authorizing Provider  acetaminophen (TYLENOL) 500 MG tablet Take 500 mg by mouth every 6 (six) hours as needed for pain.    Yes Historical Provider, MD  aspirin 325 MG tablet Take 81 mg by mouth daily. 02/25/13  Yes Monika Salk, MD  feeding supplement (ENSURE COMPLETE) LIQD Take 237 mLs by mouth 2 (two) times daily between meals. 02/25/13  Yes Monika Salk, MD  fish oil-omega-3 fatty acids 1000 MG capsule Take 1 g by mouth daily.    Yes Historical Provider, MD  levothyroxine (SYNTHROID, LEVOTHROID) 50 MCG tablet Take 50 mcg by mouth daily before breakfast.   Yes Historical Provider, MD  levothyroxine (SYNTHROID, LEVOTHROID) 75 MCG tablet  03/12/13  Yes Historical Provider, MD  losartan-hydrochlorothiazide (HYZAAR) 50-12.5 MG per tablet  Take 0.5 tablets by mouth daily. 03/13/13  Yes Samantha J Rhyne, PA-C  clopidogrel (PLAVIX) 75 MG tablet Take 1 tablet (75 mg total) by mouth daily with breakfast. 02/25/13   Monika Salk, MD  simvastatin (ZOCOR) 20 MG tablet Take 1 tablet (20 mg total) by mouth daily at 6 PM. 02/25/13   Monika Salk, MD  traMADol (ULTRAM) 50 MG tablet Take 1 tablet (50 mg total) by mouth every 6 (six) hours as needed for pain. 03/13/13   Conrad Dola, MD   BP 190/88  Pulse 65  Temp(Src) 97.7 F (36.5 C) (Oral)  Resp 18  Ht 5\' 10"  (1.778 m)  Wt 145 lb (65.772 kg)  BMI 20.81 kg/m2  SpO2 100% Physical Exam  Nursing note and vitals reviewed.  80 year old male, resting comfortably and in no acute distress. Vital  signs are significant for hypertension with blood pressure 190/88. Oxygen saturation is 100%, which is normal. Head is normocephalic and atraumatic. PERRLA, EOMI. Oropharynx is clear. Neck is nontender and supple without adenopathy or JVD. Back is nontender in the midline. There is mild to moderate left CVA tenderness. There is no right CVA tenderness. Lungs are clear without rales, wheezes, or rhonchi. Chest is nontender. Heart has regular rate and rhythm without murmur. Abdomen is soft, flat, with mild tenderness in the left mid and lower abdomen. There is no rebound or guarding. There are no masses or hepatosplenomegaly and peristalsis is normoactive. There is no bruit. Extremities have no cyanosis or edema, full range of motion is present. Skin is warm and dry without rash. Neurologic: Mental status is normal, cranial nerves are intact, there are no motor or sensory deficits.  ED Course  Procedures (including critical care time) Labs Review Results for orders placed during the hospital encounter of 10/03/13  URINALYSIS, ROUTINE W REFLEX MICROSCOPIC      Result Value Ref Range   Color, Urine YELLOW  YELLOW   APPearance CLEAR  CLEAR   Specific Gravity, Urine 1.020  1.005 - 1.030   pH 7.5  5.0 - 8.0   Glucose, UA NEGATIVE  NEGATIVE mg/dL   Hgb urine dipstick SMALL (*) NEGATIVE   Bilirubin Urine NEGATIVE  NEGATIVE   Ketones, ur TRACE (*) NEGATIVE mg/dL   Protein, ur NEGATIVE  NEGATIVE mg/dL   Urobilinogen, UA 0.2  0.0 - 1.0 mg/dL   Nitrite NEGATIVE  NEGATIVE   Leukocytes, UA NEGATIVE  NEGATIVE  CBC WITH DIFFERENTIAL      Result Value Ref Range   WBC 9.7  4.0 - 10.5 K/uL   RBC 4.16 (*) 4.22 - 5.81 MIL/uL   Hemoglobin 12.6 (*) 13.0 - 17.0 g/dL   HCT 36.3 (*) 39.0 - 52.0 %   MCV 87.3  78.0 - 100.0 fL   MCH 30.3  26.0 - 34.0 pg   MCHC 34.7  30.0 - 36.0 g/dL   RDW 13.9  11.5 - 15.5 %   Platelets 149 (*) 150 - 400 K/uL   Neutrophils Relative % 82 (*) 43 - 77 %   Neutro Abs 8.0 (*)  1.7 - 7.7 K/uL   Lymphocytes Relative 10 (*) 12 - 46 %   Lymphs Abs 1.0  0.7 - 4.0 K/uL   Monocytes Relative 7  3 - 12 %   Monocytes Absolute 0.7  0.1 - 1.0 K/uL   Eosinophils Relative 1  0 - 5 %   Eosinophils Absolute 0.1  0.0 - 0.7 K/uL   Basophils  Relative 0  0 - 1 %   Basophils Absolute 0.0  0.0 - 0.1 K/uL  BASIC METABOLIC PANEL      Result Value Ref Range   Sodium 139  137 - 147 mEq/L   Potassium 3.3 (*) 3.7 - 5.3 mEq/L   Chloride 97  96 - 112 mEq/L   CO2 26  19 - 32 mEq/L   Glucose, Bld 107 (*) 70 - 99 mg/dL   BUN 21  6 - 23 mg/dL   Creatinine, Ser 1.28  0.50 - 1.35 mg/dL   Calcium 9.8  8.4 - 10.5 mg/dL   GFR calc non Af Amer 49 (*) >90 mL/min   GFR calc Af Amer 57 (*) >90 mL/min  LACTIC ACID, PLASMA      Result Value Ref Range   Lactic Acid, Venous 1.3  0.5 - 2.2 mmol/L  URINE MICROSCOPIC-ADD ON      Result Value Ref Range   Squamous Epithelial / LPF FEW (*) RARE   WBC, UA 0-2  <3 WBC/hpf   RBC / HPF 0-2  <3 RBC/hpf   Bacteria, UA MANY (*) RARE   Imaging Review Ct Abdomen Pelvis Wo Contrast  10/03/2013   CLINICAL DATA:  Left flank pain.  EXAM: CT ABDOMEN AND PELVIS WITHOUT CONTRAST  TECHNIQUE: Multidetector CT imaging of the abdomen and pelvis was performed following the standard protocol without IV contrast.  COMPARISON:  02/17/2011  FINDINGS: BODY WALL: Fatty expansion of the inguinal canals bilaterally.  LOWER CHEST: Extensive coronary atherosclerosis. Right middle lobe scarring with nodularity. Right middle lobe opacity is decreased from 2012.  ABDOMEN/PELVIS:  Liver: Coarse subcapsular calcification in the posterior right lobe.  Biliary: No evidence of biliary obstruction or stone.  Pancreas: Unremarkable.  Spleen: Unremarkable.  Adrenals: Unremarkable.  Kidneys and ureters: Severe left hydronephrosis and periureteric edema secondary to 9 mm stone at the ureteropelvic junction. 3 mm stone in the distal third of the left ureter. 3 mm stone in the left lower pole and a  punctate calculus in the right lower pole. No significant change in a presumed cyst in the posterior hilar lip on the right, 2 cm. There is a left peripelvic cyst which continues to measure approximately 41 mm.  Bladder: Unremarkable.  Reproductive: TURP defect noted.  Bowel: Severe colonic diverticulosis. No bowel obstruction. Multiple duodenal diverticula at the duodenal genu. Normal appendix.  Retroperitoneum: No mass or adenopathy.  Peritoneum: No free fluid or gas.  Vascular: No acute abnormality. 31 mm infrarenal abdominal aortic aneurysm, increased from 2012 when it measured 29 mm.  OSSEOUS: Right hip arthroplasty, without imaged adverse feature.  IMPRESSION: 1. Obstructing 9 mm stone at the left UPJ. 2. 3 mm distal left ureteral stone with mild hydroureter. 3. Bilateral nephrolithiasis. 4. Chronic findings noted above.   Electronically Signed   By: Jorje Guild M.D.   On: 10/03/2013 04:06   Images viewed by me.  MDM   Final diagnoses:  Ureteral calculus     Left flank and abdominal pain which probably related to ureteral calculus. Old records are reviewed and CT scan in September 2012 showed a 4 mm left renal calculus. He also has a small abdominal aortic aneurysm which was 3 cm. In the 2 years since the CT scan, the aneurysm is unlikely to have increased in size to the point where it would be at risk of rupturing.  CT confirms ureteral calculus with hydronephrosis. There has been minimal increase in size of the abdominal aneurysm.  Bacteria is noted that he does not have a fever and he does not appear septic. He got good relief of pain with single dose of morphine. Because of the size of the stone, and likely will need urologic treatment to get it to pass. He is referred back to his urologist and is discharged with prescriptions for cephalexin, tamsulosin, ondansetron, and oxycodone-acetaminophen.  Delora Fuel, MD 17/00/17 [id number]

## 2013-10-04 ENCOUNTER — Other Ambulatory Visit (HOSPITAL_COMMUNITY): Payer: Medicare Other

## 2013-10-04 ENCOUNTER — Ambulatory Visit: Payer: Medicare Other | Admitting: Family

## 2013-10-04 ENCOUNTER — Ambulatory Visit: Payer: Medicare Other | Admitting: Vascular Surgery

## 2013-10-04 ENCOUNTER — Encounter (HOSPITAL_COMMUNITY): Payer: Self-pay | Admitting: *Deleted

## 2013-10-04 LAB — URINE CULTURE
CULTURE: NO GROWTH
Colony Count: NO GROWTH

## 2013-10-04 NOTE — Progress Notes (Signed)
Denies any chest pain or MI. Last EKG 01/2013

## 2013-10-04 NOTE — Pre-Procedure Instructions (Signed)
Went over pre-Litho instructions in [facility] detail with patient's wife. She read the instructions back to me and I left a mesg on the phone for the daughter who will be the driver for that day.

## 2013-10-07 ENCOUNTER — Ambulatory Visit (HOSPITAL_COMMUNITY): Payer: Medicare Other

## 2013-10-07 ENCOUNTER — Ambulatory Visit (HOSPITAL_COMMUNITY)
Admission: RE | Admit: 2013-10-07 | Discharge: 2013-10-07 | Disposition: A | Discharge status: Home / Self Care | Payer: Medicare Other | Source: Ambulatory Visit | Attending: Urology | Admitting: Urology

## 2013-10-07 ENCOUNTER — Encounter (HOSPITAL_COMMUNITY)
Admission: RE | Disposition: A | Discharge status: Home / Self Care | Payer: Self-pay | Source: Ambulatory Visit | Attending: Urology

## 2013-10-07 ENCOUNTER — Encounter (HOSPITAL_COMMUNITY): Payer: Self-pay | Admitting: *Deleted

## 2013-10-07 ENCOUNTER — Encounter (HOSPITAL_COMMUNITY): Payer: Self-pay | Admitting: Pharmacy Technician

## 2013-10-07 DIAGNOSIS — I6529 Occlusion and stenosis of unspecified carotid artery: Secondary | ICD-10-CM | POA: Insufficient documentation

## 2013-10-07 DIAGNOSIS — Z7982 Long term (current) use of aspirin: Secondary | ICD-10-CM | POA: Diagnosis not present

## 2013-10-07 DIAGNOSIS — M129 Arthropathy, unspecified: Secondary | ICD-10-CM | POA: Insufficient documentation

## 2013-10-07 DIAGNOSIS — I714 Abdominal aortic aneurysm, without rupture, unspecified: Secondary | ICD-10-CM | POA: Diagnosis not present

## 2013-10-07 DIAGNOSIS — Z87442 Personal history of urinary calculi: Secondary | ICD-10-CM | POA: Diagnosis not present

## 2013-10-07 DIAGNOSIS — N1330 Unspecified hydronephrosis: Secondary | ICD-10-CM | POA: Diagnosis not present

## 2013-10-07 DIAGNOSIS — Z01818 Encounter for other preprocedural examination: Secondary | ICD-10-CM | POA: Diagnosis not present

## 2013-10-07 DIAGNOSIS — Z79899 Other long term (current) drug therapy: Secondary | ICD-10-CM | POA: Diagnosis not present

## 2013-10-07 DIAGNOSIS — N200 Calculus of kidney: Secondary | ICD-10-CM | POA: Diagnosis not present

## 2013-10-07 DIAGNOSIS — I10 Essential (primary) hypertension: Secondary | ICD-10-CM | POA: Diagnosis not present

## 2013-10-07 DIAGNOSIS — Z882 Allergy status to sulfonamides status: Secondary | ICD-10-CM | POA: Insufficient documentation

## 2013-10-07 DIAGNOSIS — E039 Hypothyroidism, unspecified: Secondary | ICD-10-CM | POA: Diagnosis not present

## 2013-10-07 DIAGNOSIS — Q619 Cystic kidney disease, unspecified: Secondary | ICD-10-CM | POA: Diagnosis not present

## 2013-10-07 SURGERY — LITHOTRIPSY, ESWL
Anesthesia: LOCAL | Laterality: Left

## 2013-10-07 MED ORDER — DIPHENHYDRAMINE HCL 25 MG PO CAPS
25.0000 mg | ORAL_CAPSULE | ORAL | Status: AC
  Administered 2013-10-07: 25 mg via ORAL
  Filled 2013-10-07: qty 1

## 2013-10-07 MED ORDER — OXYCODONE HCL 10 MG PO TABS: 10.0000 mg | ORAL_TABLET | ORAL | Status: DC | PRN

## 2013-10-07 MED ORDER — SODIUM CHLORIDE 0.9 % IV SOLN
INTRAVENOUS | Status: DC
  Administered 2013-10-07: 15:00:00 via INTRAVENOUS

## 2013-10-07 MED ORDER — CIPROFLOXACIN HCL 500 MG PO TABS
500.0000 mg | ORAL_TABLET | ORAL | Status: AC
  Administered 2013-10-07: 500 mg via ORAL
  Filled 2013-10-07: qty 1

## 2013-10-07 MED ORDER — DIAZEPAM 5 MG PO TABS
10.0000 mg | ORAL_TABLET | ORAL | Status: AC
  Administered 2013-10-07: 10 mg via ORAL
  Filled 2013-10-07: qty 2

## 2013-10-07 MED ORDER — TAMSULOSIN HCL 0.4 MG PO CAPS: 0.4000 mg | ORAL_CAPSULE | ORAL | Status: DC

## 2013-10-07 NOTE — Discharge Instructions (Signed)
Lithotripsy, Care After °Refer to this sheet in the next few weeks. These instructions provide you with information on caring for yourself after your procedure. Your health care provider may also give you more specific instructions. Your treatment has been planned according to current medical practices, but problems sometimes occur. Call your health care provider if you have any problems or questions after your procedure. °WHAT TO EXPECT AFTER THE PROCEDURE  °· Your urine may have a red tinge for a few days after treatment. Blood loss is usually minimal. °· You may have soreness in the back or flank area. This usually goes away after a few days. The procedure can cause blotches or bruises on the back where the pressure wave enters the skin. These marks usually cause only minimal discomfort and should disappear in a short time. °· Stone fragments should begin to pass within 24 hours of treatment. However, a delayed passage is not unusual. °· You may have pain, discomfort, and feel sick to your stomach (nauseated) when the crushed fragments of stone are passed down the tube from the kidney to the bladder. Stone fragments can pass soon after the procedure and may last for up to 4 8 weeks. °· A small number of patients may have severe pain when stone fragments are not able to pass, which leads to an obstruction. °· If your stone is greater than 1 inch (2.5 cm) in diameter or if you have multiple stones that have a combined diameter greater than 1 inch (2.5 cm), you may require more than one treatment. °· If you had a stent placed prior to your procedure, you may experience some discomfort, especially during urination. You may experience the pain or discomfort in your flank or back, or you may experience a sharp pain or discomfort at the base of your penis or in your lower abdomen. The discomfort usually lasts only a few minutes after urinating. °HOME CARE INSTRUCTIONS  °· Rest at home until you feel your energy  improving. °· Only take over-the-counter or prescription medicines for pain, discomfort, or fever as directed by your health care provider. Depending on the type of lithotripsy, you may need to take antibiotics and anti-inflammatory medicines for a few days. °· Drink enough water and fluids to keep your urine clear or pale yellow. This helps "flush" your kidneys. It helps pass any remaining pieces of stone and prevents stones from coming back. °· Most people can resume daily activities within 1 2 days after standard lithotripsy. It can take longer to recover from laser and percutaneous lithotripsy. °· If the stones are in your urinary system, you may be asked to strain your urine at home to look for stones. Any stones that are found can be sent to a medical lab for examination. °· Visit your health care provider for a follow-up appointment in a few weeks. Your doctor may remove your stent if you have one. Your health care provider will also check to see whether stone particles still remain. °SEEK MEDICAL CARE IF:  °· Your pain is not relieved by medicine. °· You have a lasting nauseous feeling. °· You feel there is too much blood in the urine. °· You develop persistent problems with frequent or painful urination that does not at least partially improve after 2 days following the procedure. °· You have a congested cough. °· You feel lightheaded. °· You develop a rash or any other signs that might suggest an allergic problem. °· You develop any reaction or side   effects to your medicine(s). °SEEK IMMEDIATE MEDICAL CARE IF:  °· You experience severe back or flank pain or both. °· You see nothing but blood when you urinate. °· You cannot pass any urine at all. °· You have a fever or shaking chills. °· You develop shortness of breath, difficulty breathing, or chest pain. °· You develop vomiting that will not stop after 6 8 hours. °· You have a fainting episode. °Document Released: 06/05/2007 Document Revised: 03/06/2013  Document Reviewed: 11/29/2012 °[employer]® Patient Information ©2014 [employer]. ° °

## 2013-10-07 NOTE — Op Note (Signed)
See [facility] OP note scanned into chart. 

## 2013-10-07 NOTE — H&P (Signed)
Jorge Nixon is an 80 year old male with a left UPJ stone.    History of Present Illness Nephrolithiasis: He has a history of calculus disease and has undergone 2 previous surgeries.  6/06 - ESWL right renal by [place] [facility]  7/06 - right ureteroscopy and stone extraction.    BPH: He underwent a TURP in '94.    Renal cysts: He has a known left parapelvic cyst and a right renal cyst seen on previous CT scans.    Interval history: He was seen in the emergency room last night and a CT scan was obtained. It revealed a 9 mm stone located at the left UPJ with perinephric stranding and dilatation of the collecting system. There was also a 3 mm stone in the distal left ureter, a 3 mm left lower pole stone and a punctate right renal calculus. His left UPJ stone has Hounsfield units of 1300. He was having severe pain last night when he went to the ER. That pain has diminished significantly. He has not seen a stone pass. He denies any fever or chills. He was inexplicably placed on Keflex. His stone pain was not relieved by any positional change.    Past Medical History Problems  1. History of Carotid artery occlusion (433.10) 2. History of abdominal aortic aneurysm (V12.59) 3. History of arthritis (V13.4) 4. History of heartburn (V12.79) 5. History of hypertension (V12.59) 6. History of hypothyroidism (V12.29)  Surgical History Problems  1. History of Carotid Thromboendarterectomy 2. History of Cystoscopy With Ureteroscopy With Lithotripsy 3. History of Hip Replacement 4. History of Knee Arthroplasty 5. History of Knee Surgery 6. History of Lithotripsy 7. History of Transurethral Resection Of Prostate (TURP)  Current Meds 1. Aspirin 81 MG Oral Tablet;  Therapy: (Recorded:07May2015) to Recorded 2. Cephalexin 500 MG Oral Capsule; TAKE 1 CAPSULE 4 times daily;  Therapy: (Recorded:07May2015) to Recorded 3. Levothyroxine Sodium 75 MCG Oral Tablet;  Therapy: 40HKV4259 to Recorded 4. Losartan  Potassium-HCTZ 50-12.5 MG Oral Tablet;  Therapy: 03Oct2014 to Recorded 5. Oxycodone-Acetaminophen 5-325 MG Oral Tablet;  Therapy: (Recorded:07May2015) to Recorded  Allergies Medication  1. Sulfa Drugs  Family History Problems  1. Family history of Family Health Status Number Of Children   3 children; only 1 living 2. Family history of acute myocardial infarction (V17.3) : Father  Social History Problems    Denied: History of Alcohol use   Caffeine Use   1 cup of coffee; occasional soda   Father deceased   Marital History - Currently Married   Married   Mother deceased   Never a smoker  Review of Systems Genitourinary, constitutional, skin, eye, otolaryngeal, hematologic/lymphatic, cardiovascular, pulmonary, endocrine, musculoskeletal, gastrointestinal, neurological and psychiatric system(s) were reviewed and pertinent findings if present are noted.  Genitourinary: no hematuria.  Gastrointestinal: nausea, flank pain, heartburn and constipation.  Eyes: blurred vision.  ENT: sinus problems.  Hematologic/Lymphatic: a tendency to easily bruise.  Respiratory: shortness of breath and cough.  Musculoskeletal: back pain and joint pain.  Neurological: headache and dizziness.  Psychiatric: anxiety.    Vitals Vital Signs  Height: 5 ft 10 in Weight: 145 lb  BMI Calculated: 20.81 BSA Calculated: 1.82 Blood Pressure: 142 / 73 Temperature: 97 F Heart Rate: 62  Physical Exam Constitutional: Well nourished and well developed . No acute distress.  ENT:. The ears and nose are normal in appearance.  Neck: The appearance of the neck is normal and no neck mass is present.  Pulmonary: No respiratory distress and normal respiratory rhythm and effort.  Cardiovascular: Heart rate and rhythm are normal . No peripheral edema.  Abdomen: The abdomen is soft and nontender. No masses are palpated. No CVA tenderness. No hernias are palpable. No hepatosplenomegaly noted.  Lymphatics: The  femoral and inguinal nodes are not enlarged or tender.  Skin: Normal skin turgor, no visible rash and no visible skin lesions.  Neuro/Psych:. Mood and affect are appropriate.    Results/Data Urine  COLOR YELLOW  APPEARANCE CLEAR  SPECIFIC GRAVITY 1.025  pH 6.0  GLUCOSE NEG mg/dL BILIRUBIN NEG  KETONE NEG mg/dL BLOOD SMALL  PROTEIN NEG mg/dL UROBILINOGEN 0.2 mg/dL NITRITE NEG  LEUKOCYTE ESTERASE NEG  SQUAMOUS EPITHELIAL/HPF RARE  WBC 0-2 WBC/hpf RBC 3-6 RBC/hpf BACTERIA RARE  CRYSTALS NONE SEEN  CASTS NONE SEEN  Other MUCUS   Old records or history reviewed: ER notes as above.  The following images/tracing/specimen were independently visualized:  CT scan as above.  The following clinical lab reports were reviewed:  His urine had red cells but there was no sign of infection whatsoever.  The following radiology reports were reviewed: CT scan.    Assessment Assessed  1. Hydronephrosis, left (591) 2. Ureteral calculus, left (592.1) 3. Parapelvic renal cyst (593.2) 4. Renal cyst, acquired, right (593.2)  We discussed the management of urinary stones. These options include observation, ureteroscopy, shockwave lithotripsy, and PCNL. We discussed which options are relevant to these particular stones. We discussed the natural history of stones as well as the complications of untreated stones and the impact on quality of life without treatment as well as with each of the above listed treatments. We also discussed the efficacy of each treatment in its ability to clear the stone burden. With any of these management options I discussed the signs and symptoms of infection and the need for emergent treatment should these be experienced. For each option we discussed the ability of each procedure to clear the patient of their stone burden.    For observation I described the risks which include but are not limited to silent renal damage, life-threatening infection, need for emergent surgery,  failure to pass stone, and pain.    For ureteroscopy I described the risks which include heart attack, stroke, pulmonary embolus, death, bleeding, infection, damage to contiguous structures, positioning injury, ureteral stricture, ureteral avulsion, ureteral injury, need for ureteral stent, inability to perform ureteroscopy, need for an interval procedure, inability to clear stone burden, stent discomfort and pain.    For shockwave lithotripsy I described the risks which include arrhythmia, kidney contusion, kidney hemorrhage, need for transfusion, long-term risk of diabetes or hypertension, back discomfort, flank ecchymosis, flank abrasion, inability to break up stone, inability to pass stone fragments, Steinstrasse, infection associated with obstructing stones, need for different surgical procedure, need for repeat shockwave lithotripsy, and death.    He would like to proceed with lithotripsy. I told him that there is no indication whatsoever that he needs to be on an antibiotic. He is going to therefore stop his Keflex and be scheduled for lithotripsy.     His left parapelvic cyst appears unchanged. Right renal cysts are unchanged as well. These appear entirely benign.   Plan   1.  I told him that he did not need to take any further Keflex.  2. I gave him longer prescription of tamsulosin.    3. He is scheduled for lithotripsy of his left UPJ stone.  4. He has stopped his daily aspirin.

## 2013-10-10 ENCOUNTER — Encounter: Payer: Self-pay | Admitting: Family

## 2013-10-11 ENCOUNTER — Ambulatory Visit (INDEPENDENT_AMBULATORY_CARE_PROVIDER_SITE_OTHER): Payer: Medicare Other | Admitting: Family

## 2013-10-11 ENCOUNTER — Ambulatory Visit (HOSPITAL_COMMUNITY)
Admission: RE | Admit: 2013-10-11 | Discharge: 2013-10-11 | Disposition: A | Discharge status: Home / Self Care | Payer: Medicare Other | Source: Ambulatory Visit | Attending: Family | Admitting: Family

## 2013-10-11 ENCOUNTER — Encounter: Payer: Self-pay | Admitting: Family

## 2013-10-11 VITALS — BP 153/81 | HR 72 | Resp 16 | Ht 70.0 in | Wt 145.0 lb

## 2013-10-11 DIAGNOSIS — I63239 Cerebral infarction due to unspecified occlusion or stenosis of unspecified carotid arteries: Secondary | ICD-10-CM

## 2013-10-11 DIAGNOSIS — Z48812 Encounter for surgical aftercare following surgery on the circulatory system: Secondary | ICD-10-CM | POA: Diagnosis not present

## 2013-10-11 DIAGNOSIS — I6529 Occlusion and stenosis of unspecified carotid artery: Secondary | ICD-10-CM | POA: Diagnosis not present

## 2013-10-11 NOTE — Patient Instructions (Signed)

## 2013-10-11 NOTE — Progress Notes (Signed)
Established Carotid Patient   History of Present Illness  Jorge Nixon is a 80 y.o. male patient of Dr. Bridgett Larsson who returns for follow up s/p L CEA (Date: 03/11/13).  He had 2 TIA's within a week before the left CEA as manifested by right arm tingling and weakness that resolved within a few minutes. Pt denies any amaurosis fugax, denies aphasia, denies confusion.  Pt  reports New Medical or Surgical History: had a lithotripsy for kidney stone, is straining his urine for stones.  Pt Diabetic: No Pt smoker: non-smoker  Pt meds include: Statin : No ASA: No, will resume in a few days after the lithotripsy Other anticoagulants/antiplatelets: no   Past Medical History  Diagnosis Date  . Hypertension   . Diverticular disease 02/14/2011    problems swallowing  . GERD (gastroesophageal reflux disease)   . Coronary artery disease   . Kidney stone   . Hypothyroidism   . Anemia   . COPD (chronic obstructive pulmonary disease)   . Non-ischemic cardiomyopathy     mild  EF on echo 05/03/2012 55 to 60%  . Nephrolithiasis     lithotripsy 2006  . BPH (benign prostatic hyperplasia)   . TIA (transient ischemic attack)   . Shortness of breath     exertion  . Headache(784.0)   . Dizziness   . Arthritis   . History of blood transfusion   . Pneumonia     hx of - hospitalized  . Carotid artery occlusion   . Stroke 03-11-13  . DVT (deep venous thrombosis)     Social History History  Substance Use Topics  . Smoking status: Never Smoker   . Smokeless tobacco: Never Used  . Alcohol Use: No    Family History Family History  Problem Relation Age of Onset  . Stroke Father   . Heart attack Father   . Hypertension    . Diabetes    . Hypertension Mother     Surgical History Past Surgical History  Procedure Laterality Date  . Total hip arthroplasty Right august 6th 2012    right side   . Replacement total knee Left 2006    left knee  . Transurethral resection of prostate  1996  .  Knee arthroscopy Right 2010  . Joint replacement Right     hip  . Joint replacement Left     knee  . Carpel tunnel Right   . Endarterectomy Left 03/11/2013    Procedure: ENDARTERECTOMY CAROTID with Vascu-Guard Bovine Patch.;  Surgeon: Conrad Hawk Run, MD;  Location: [facility];  Service: Vascular;  Laterality: Left;  . Carotid endarterectomy Left 03-11-13    cea    Allergies  Allergen Reactions  . Sulfa Antibiotics Rash  . Milk-Related Compounds Other (See Comments)    Upset stomach    Current Outpatient Prescriptions  Medication Sig Dispense Refill  . acetaminophen (TYLENOL) 500 MG tablet Take 1,000 mg by mouth every 6 (six) hours as needed for pain.       . fish oil-omega-3 fatty acids 1000 MG capsule Take 1 g by mouth every morning.       [name] levothyroxine (SYNTHROID, LEVOTHROID) 50 MCG tablet Take 50 mcg by mouth daily before breakfast.      . losartan-hydrochlorothiazide (HYZAAR) 50-12.5 MG per tablet Take 0.5 tablets by mouth every morning.      . Nutritional Supplements (ENSURE COMPLETE PO) Take 237 mLs by mouth 2 (two) times daily as needed (For nutritional supplement.).      [name]  tamsulosin (FLOMAX) 0.4 MG CAPS capsule Take 1 capsule (0.4 mg total) by mouth daily after supper.  30 capsule  0  . aspirin EC 81 MG tablet Take 81 mg by mouth every morning.      . ondansetron (ZOFRAN) 4 MG tablet Take 1 tablet (4 mg total) by mouth every 6 (six) hours as needed for nausea.  20 tablet  0  . Oxycodone HCl 10 MG TABS Take 1 tablet (10 mg total) by mouth every 4 (four) hours as needed.  30 tablet  0  . tamsulosin (FLOMAX) 0.4 MG CAPS capsule Take 0.4 mg by mouth every morning.       No current facility-administered medications for this visit.    Review of Systems : See HPI for pertinent positives and negatives.  Physical Examination  Filed Vitals:   10/11/13 1450 10/11/13 1453  BP: 157/90 153/81  Pulse: 71 72  Resp:  16  Height:  5\' 10"  (1.778 m)  Weight:  145 lb (65.772 kg)  SpO2:   100%   Body mass index is 20.81 kg/(m^2).   General: WDWN male in NAD GAIT: using quad cane, slow, deliberate Eyes: PERRLA Pulmonary:  Non-labored, CTAB, Negative  Rales, Negative rhonchi, & Negative wheezing.  Cardiac: regular Rhythm ,  Negative detected murmur.  VASCULAR EXAM Carotid Bruits Left Right   Negative Negative    Aorta is not palpable. Radial pulses are are 2+ palpable and =.                                                                                                                            LE Pulses LEFT RIGHT       POPLITEAL  not palpable   not palpable       POSTERIOR TIBIAL  not palpable   not palpable        DORSALIS PEDIS      ANTERIOR TIBIAL 1+ palpable  1+ palpable     Gastrointestinal: soft, nontender, BS WNL, no r/g,  negative masses.  Musculoskeletal: Negative muscle atrophy/wasting. M/S 4/5 in UE's, 3/5 in LE's, Extremities without ischemic changes.  Neurologic: A&O X 3; Appropriate Affect ; SENSATION ;normal;  Speech is normal CN 2-12 intact  Except has significant hearing loss, Pain and light touch intact in extremities, Motor exam as listed above.   Non-Invasive Vascular Imaging CAROTID DUPLEX 10/11/2013   CEREBROVASCULAR DUPLEX EVALUATION    INDICATION: Carotid artery disease     PREVIOUS INTERVENTION(S): Left carotid endarterectomy 03/11/2013.    DUPLEX EXAM:     RIGHT  LEFT  Peak Systolic Velocities (cm/s) End Diastolic Velocities (cm/s) Plaque LOCATION Peak Systolic Velocities (cm/s) End Diastolic Velocities (cm/s) Plaque  63 17  CCA PROXIMAL 75 14   57 15  CCA MID 74 15   70 18  CCA DISTAL 123 24   92 9  ECA 146 12   48 15  ICA PROXIMAL 94 14   61 23  ICA MID 56 20   67 21  ICA DISTAL 59 19     1.17 ICA / CCA Ratio (PSV)   Antegrade  Vertebral Flow Antegrade   161 Brachial Systolic Pressure (mmHg) 096  Triphasic  Brachial Artery Waveforms Triphasic     Plaque Morphology:  HM = Homogeneous, HT = Heterogeneous, CP  = Calcific Plaque, SP = Smooth Plaque, IP = Irregular Plaque     ADDITIONAL FINDINGS:     IMPRESSION: Right internal carotid artery velocities suggest a <40% stenosis.  Patent left carotid endarterectomy site with no evidence of hyperplasia or restenosis.     Compared to the previous exam:  No prior exam performed at this facility for comparison.     Assessment: Jorge Nixon is a 80 y.o. male who is s/p L CEA (Date: 03/11/13); during surgery it was found that he had carotid stenosis >70%.  He had 2 TIA's within a week before the left CEA as manifested by right arm tingling and weakness that resolved within a few minutes. Right internal carotid artery velocities suggest a <40% stenosis.  Patent left carotid endarterectomy site with no evidence of hyperplasia or restenosis.   Plan: Follow-up in 1 year with Carotid Duplex scan.   I discussed in depth with the patient the nature of atherosclerosis, and emphasized the importance of maximal medical management including strict control of blood pressure, blood glucose, and lipid levels, obtaining regular exercise, and continued cessation of smoking.  The patient is aware that without maximal medical management the underlying atherosclerotic disease process will progress, limiting the benefit of any interventions. The patient was given information about stroke prevention and what symptoms should prompt the patient to seek immediate medical care. Thank you for allowing [place] to participate in this patient's care.  Clemon Chambers, RN, MSN, FNP-C [facility] Office: [facility] Physician: Bridgett Larsson  10/11/2013 3:15 PM

## 2013-10-14 DIAGNOSIS — N201 Calculus of ureter: Secondary | ICD-10-CM | POA: Diagnosis not present

## 2013-10-14 DIAGNOSIS — N200 Calculus of kidney: Secondary | ICD-10-CM | POA: Diagnosis not present

## 2013-10-31 DIAGNOSIS — E039 Hypothyroidism, unspecified: Secondary | ICD-10-CM | POA: Diagnosis not present

## 2013-10-31 DIAGNOSIS — K5900 Constipation, unspecified: Secondary | ICD-10-CM | POA: Diagnosis not present

## 2014-01-21 DIAGNOSIS — N200 Calculus of kidney: Secondary | ICD-10-CM | POA: Diagnosis not present

## 2014-02-14 DIAGNOSIS — R0789 Other chest pain: Secondary | ICD-10-CM | POA: Diagnosis not present

## 2014-02-15 ENCOUNTER — Encounter (HOSPITAL_COMMUNITY): Payer: Self-pay | Admitting: Emergency Medicine

## 2014-02-15 ENCOUNTER — Emergency Department (HOSPITAL_COMMUNITY)
Admission: EM | Admit: 2014-02-15 | Discharge: 2014-02-15 | Disposition: A | Discharge status: Home / Self Care | Payer: Medicare Other | Attending: Emergency Medicine | Admitting: Emergency Medicine

## 2014-02-15 ENCOUNTER — Emergency Department (HOSPITAL_COMMUNITY): Payer: Medicare Other

## 2014-02-15 DIAGNOSIS — M129 Arthropathy, unspecified: Secondary | ICD-10-CM | POA: Diagnosis not present

## 2014-02-15 DIAGNOSIS — Z7982 Long term (current) use of aspirin: Secondary | ICD-10-CM | POA: Diagnosis not present

## 2014-02-15 DIAGNOSIS — Z86718 Personal history of other venous thrombosis and embolism: Secondary | ICD-10-CM | POA: Diagnosis not present

## 2014-02-15 DIAGNOSIS — Z79899 Other long term (current) drug therapy: Secondary | ICD-10-CM | POA: Diagnosis not present

## 2014-02-15 DIAGNOSIS — N201 Calculus of ureter: Secondary | ICD-10-CM | POA: Diagnosis not present

## 2014-02-15 DIAGNOSIS — Z8673 Personal history of transient ischemic attack (TIA), and cerebral infarction without residual deficits: Secondary | ICD-10-CM | POA: Insufficient documentation

## 2014-02-15 DIAGNOSIS — Z8701 Personal history of pneumonia (recurrent): Secondary | ICD-10-CM | POA: Diagnosis not present

## 2014-02-15 DIAGNOSIS — E039 Hypothyroidism, unspecified: Secondary | ICD-10-CM | POA: Insufficient documentation

## 2014-02-15 DIAGNOSIS — N400 Enlarged prostate without lower urinary tract symptoms: Secondary | ICD-10-CM | POA: Diagnosis not present

## 2014-02-15 DIAGNOSIS — R109 Unspecified abdominal pain: Secondary | ICD-10-CM | POA: Insufficient documentation

## 2014-02-15 DIAGNOSIS — N1330 Unspecified hydronephrosis: Secondary | ICD-10-CM | POA: Diagnosis not present

## 2014-02-15 DIAGNOSIS — N200 Calculus of kidney: Secondary | ICD-10-CM | POA: Insufficient documentation

## 2014-02-15 DIAGNOSIS — I2510 Atherosclerotic heart disease of native coronary artery without angina pectoris: Secondary | ICD-10-CM | POA: Insufficient documentation

## 2014-02-15 DIAGNOSIS — Z8719 Personal history of other diseases of the digestive system: Secondary | ICD-10-CM | POA: Diagnosis not present

## 2014-02-15 DIAGNOSIS — N289 Disorder of kidney and ureter, unspecified: Secondary | ICD-10-CM | POA: Diagnosis not present

## 2014-02-15 DIAGNOSIS — J4489 Other specified chronic obstructive pulmonary disease: Secondary | ICD-10-CM | POA: Diagnosis not present

## 2014-02-15 DIAGNOSIS — I10 Essential (primary) hypertension: Secondary | ICD-10-CM | POA: Diagnosis not present

## 2014-02-15 DIAGNOSIS — J449 Chronic obstructive pulmonary disease, unspecified: Secondary | ICD-10-CM | POA: Diagnosis not present

## 2014-02-15 DIAGNOSIS — Z862 Personal history of diseases of the blood and blood-forming organs and certain disorders involving the immune mechanism: Secondary | ICD-10-CM | POA: Diagnosis not present

## 2014-02-15 LAB — URINALYSIS, ROUTINE W REFLEX MICROSCOPIC
BILIRUBIN URINE: NEGATIVE
Glucose, UA: NEGATIVE mg/dL
Ketones, ur: NEGATIVE mg/dL
Leukocytes, UA: NEGATIVE
NITRITE: NEGATIVE
Protein, ur: NEGATIVE mg/dL
Specific Gravity, Urine: 1.02 (ref 1.005–1.030)
UROBILINOGEN UA: 0.2 mg/dL (ref 0.0–1.0)
pH: 6 (ref 5.0–8.0)

## 2014-02-15 LAB — CBC
HCT: 36.4 % — ABNORMAL LOW (ref 39.0–52.0)
Hemoglobin: 12.4 g/dL — ABNORMAL LOW (ref 13.0–17.0)
MCH: 30 pg (ref 26.0–34.0)
MCHC: 34.1 g/dL (ref 30.0–36.0)
MCV: 88.1 fL (ref 78.0–100.0)
PLATELETS: 155 10*3/uL (ref 150–400)
RBC: 4.13 MIL/uL — ABNORMAL LOW (ref 4.22–5.81)
RDW: 14.4 % (ref 11.5–15.5)
WBC: 8 10*3/uL (ref 4.0–10.5)

## 2014-02-15 LAB — COMPREHENSIVE METABOLIC PANEL
ALT: 11 U/L (ref 0–53)
ANION GAP: 14 (ref 5–15)
AST: 29 U/L (ref 0–37)
Albumin: 4.1 g/dL (ref 3.5–5.2)
Alkaline Phosphatase: 67 U/L (ref 39–117)
BUN: 26 mg/dL — AB (ref 6–23)
CALCIUM: 9.2 mg/dL (ref 8.4–10.5)
CO2: 27 mEq/L (ref 19–32)
Chloride: 99 mEq/L (ref 96–112)
Creatinine, Ser: 1.8 mg/dL — ABNORMAL HIGH (ref 0.50–1.35)
GFR calc Af Amer: 37 mL/min — ABNORMAL LOW (ref >90)
GFR calc non Af Amer: 32 mL/min — ABNORMAL LOW (ref >90)
Glucose, Bld: 111 mg/dL — ABNORMAL HIGH (ref 70–99)
Potassium: 3.6 mEq/L — ABNORMAL LOW (ref 3.7–5.3)
SODIUM: 140 meq/L (ref 137–147)
TOTAL PROTEIN: 7.7 g/dL (ref 6.0–8.3)
Total Bilirubin: 0.3 mg/dL (ref 0.3–1.2)

## 2014-02-15 MED ORDER — KETOROLAC TROMETHAMINE 30 MG/ML IJ SOLN
30.0000 mg | Freq: Once | INTRAMUSCULAR | Status: AC
  Administered 2014-02-15: 30 mg via INTRAVENOUS
  Filled 2014-02-15: qty 1

## 2014-02-15 MED ORDER — ONDANSETRON HCL 4 MG/2ML IJ SOLN
4.0000 mg | Freq: Once | INTRAMUSCULAR | Status: AC
  Administered 2014-02-15: 4 mg via INTRAVENOUS
  Filled 2014-02-15: qty 2

## 2014-02-15 MED ORDER — HYDROCODONE-ACETAMINOPHEN 5-325 MG PO TABS: 2.0000 | ORAL_TABLET | ORAL | Status: DC | PRN

## 2014-02-15 MED ORDER — NAPROXEN 500 MG PO TABS: 500.0000 mg | ORAL_TABLET | Freq: Two times a day (BID) | ORAL | Status: DC

## 2014-02-15 MED ORDER — TAMSULOSIN HCL 0.4 MG PO CAPS: 0.4000 mg | ORAL_CAPSULE | Freq: Two times a day (BID) | ORAL | Status: DC

## 2014-02-15 MED ORDER — ONDANSETRON 4 MG PO TBDP: 4.0000 mg | ORAL_TABLET | Freq: Three times a day (TID) | ORAL | Status: DC | PRN

## 2014-02-15 NOTE — Discharge Instructions (Signed)
Please call your doctor for a followup appointment within 24-48 hours. When you talk to your doctor please let them know that you were seen in the emergency department and have them acquire all of your records so that they can discuss the findings with you and formulate a treatment plan to fully care for your new and ongoing problems.  You need to have your kidneys checked in the next week.  If you have worsening pain, return to the ER.

## 2014-02-15 NOTE — ED Provider Notes (Signed)
CSN: [id number]     Arrival date & time 02/15/14  65 History   First MD Initiated Contact with Patient 02/15/14 1805     Chief Complaint  Patient presents with  . Flank Pain     (Consider location/radiation/quality/duration/timing/severity/associated sxs/prior Treatment) HPI Comments: The patient is an 80 year old male with history of kidney stones, underwent shock wave lithotripsy 4 months ago by Dr. Karsten Ro secondary to a 1 cm proximal ureteral kidney stone. Since that time the patient has done very well but states that 2 days ago he developed the onset of left sided flank pain which is sharp and stabbing and radiates from his left flank around to the left lower quadrant. He endorses nausea but has had no vomiting and has had no fevers chills. He does not know if he has had hematuria. The symptoms are intermittent, severe at worst and currently are present. Nothing seems to make the symptoms come on or go away.   Patient is a 80 y.o. male presenting with flank pain. The history is provided by the patient.  Flank Pain    Past Medical History  Diagnosis Date  . Hypertension   . Diverticular disease 02/14/2011    problems swallowing  . GERD (gastroesophageal reflux disease)   . Coronary artery disease   . Kidney stone   . Hypothyroidism   . Anemia   . COPD (chronic obstructive pulmonary disease)   . Non-ischemic cardiomyopathy     mild  EF on echo 05/03/2012 55 to 60%  . Nephrolithiasis     lithotripsy 2006  . BPH (benign prostatic hyperplasia)   . TIA (transient ischemic attack)   . Shortness of breath     exertion  . Headache(784.0)   . Dizziness   . Arthritis   . History of blood transfusion   . Pneumonia     hx of - hospitalized  . Carotid artery occlusion   . Stroke 03-11-13  . DVT (deep venous thrombosis)    Past Surgical History  Procedure Laterality Date  . Total hip arthroplasty Right august 6th 2012    right side   . Replacement total knee Left 2006     left knee  . Transurethral resection of prostate  1996  . Knee arthroscopy Right 2010  . Joint replacement Right     hip  . Joint replacement Left     knee  . Carpel tunnel Right   . Endarterectomy Left 03/11/2013    Procedure: ENDARTERECTOMY CAROTID with Vascu-Guard Bovine Patch.;  Surgeon: Conrad [place], MD;  Location: [facility];  Service: Vascular;  Laterality: Left;  . Carotid endarterectomy Left 03-11-13    cea   Family History  Problem Relation Age of Onset  . Stroke Father   . Heart attack Father   . Hypertension    . Diabetes    . Hypertension Mother    History  Substance Use Topics  . Smoking status: Never Smoker   . Smokeless tobacco: Never Used  . Alcohol Use: No    Review of Systems  Genitourinary: Positive for flank pain.  All other systems reviewed and are negative.     Allergies  Sulfa antibiotics and Milk-related compounds  Home Medications   Prior to Admission medications   Medication Sig Start Date End Date Taking? Authorizing Provider  acetaminophen (TYLENOL) 500 MG tablet Take 1,000 mg by mouth every 6 (six) hours as needed for pain.     Historical Provider, MD  aspirin EC 81  MG tablet Take 81 mg by mouth every morning.    Historical Provider, MD  fish oil-omega-3 fatty acids 1000 MG capsule Take 1 g by mouth every morning.     Historical Provider, MD  HYDROcodone-acetaminophen (NORCO/VICODIN) 5-325 MG per tablet Take 2 tablets by mouth every 4 (four) hours as needed. 02/15/14   Johnna Acosta, MD  HYDROcodone-acetaminophen (NORCO/VICODIN) 5-325 MG per tablet Take 2 tablets by mouth every 4 (four) hours as needed for moderate pain. 02/15/14   Johnna Acosta, MD  levothyroxine (SYNTHROID, LEVOTHROID) 50 MCG tablet Take 50 mcg by mouth daily before breakfast.    Historical Provider, MD  losartan-hydrochlorothiazide (HYZAAR) 50-12.5 MG per tablet Take 0.5 tablets by mouth every morning.    Historical Provider, MD  naproxen (NAPROSYN) 500 MG tablet Take 1  tablet (500 mg total) by mouth 2 (two) times daily with a meal. 02/15/14   Johnna Acosta, MD  Nutritional Supplements (ENSURE COMPLETE PO) Take 237 mLs by mouth 2 (two) times daily as needed (For nutritional supplement.).    Historical Provider, MD  ondansetron (ZOFRAN ODT) 4 MG disintegrating tablet Take 1 tablet (4 mg total) by mouth every 8 (eight) hours as needed for nausea. 02/15/14   Johnna Acosta, MD  ondansetron (ZOFRAN) 4 MG tablet Take 1 tablet (4 mg total) by mouth every 6 (six) hours as needed for nausea. 06/04/58   Delora Fuel, MD  Oxycodone HCl 10 MG TABS Take 1 tablet (10 mg total) by mouth every 4 (four) hours as needed. 10/07/13   Claybon Jabs, MD  tamsulosin (FLOMAX) 0.4 MG CAPS capsule Take 0.4 mg by mouth every morning.    Historical Provider, MD  tamsulosin (FLOMAX) 0.4 MG CAPS capsule Take 1 capsule (0.4 mg total) by mouth 2 (two) times daily. 02/15/14   Johnna Acosta, MD   BP 188/95  Pulse 78  Temp(Src) 98.3 F (36.8 C) (Oral)  Resp 18  Ht 5\' 10"  (1.778 m)  Wt 142 lb (64.411 kg)  BMI 20.37 kg/m2  SpO2 98% Physical Exam  Nursing note and vitals reviewed. Constitutional: He appears well-developed and well-nourished. No distress.  HENT:  Head: Normocephalic and atraumatic.  Mouth/Throat: Oropharynx is clear and moist. No oropharyngeal exudate.  Eyes: Conjunctivae and EOM are normal. Pupils are equal, round, and reactive to light. Right eye exhibits no discharge. Left eye exhibits no discharge. No scleral icterus.  Neck: Normal range of motion. Neck supple. No JVD present. No thyromegaly present.  Cardiovascular: Normal rate, regular rhythm, normal heart sounds and intact distal pulses.  Exam reveals no gallop and no friction rub.   No murmur heard. Pulmonary/Chest: Effort normal and breath sounds normal. No respiratory distress. He has no wheezes. He has no rales.  Abdominal: Soft. Bowel sounds are normal. He exhibits no distension and no mass. There is tenderness.   Mild to moderate right lower abdominal tenderness with mild guarding, moderate left lower quadrant tenderness with mild guarding, no upper abdominal tenderness, no peritoneal signs, no distention. Mild left CVA tenderness.  Musculoskeletal: Normal range of motion. He exhibits no edema and no tenderness.  Lymphadenopathy:    He has no cervical adenopathy.  Neurological: He is alert. Coordination normal.  Skin: Skin is warm and dry. No rash noted. No erythema.  Psychiatric: He has a normal mood and affect. His behavior is normal.    ED Course  Procedures (including critical care time) Labs Review Labs Reviewed  CBC - Abnormal; Notable for  the following:    RBC 4.13 (*)    Hemoglobin 12.4 (*)    HCT 36.4 (*)    All other components within normal limits  URINALYSIS, ROUTINE W REFLEX MICROSCOPIC - Abnormal; Notable for the following:    Hgb urine dipstick TRACE (*)    All other components within normal limits  COMPREHENSIVE METABOLIC PANEL - Abnormal; Notable for the following:    Potassium 3.6 (*)    Glucose, Bld 111 (*)    BUN 26 (*)    Creatinine, Ser 1.80 (*)    GFR calc non Af Amer 32 (*)    GFR calc Af Amer 37 (*)    All other components within normal limits  URINE MICROSCOPIC-ADD ON    Imaging Review Ct Abdomen Pelvis Wo Contrast  02/15/2014   CLINICAL DATA:  History of diverticular disease and renal stones.  EXAM: CT ABDOMEN AND PELVIS WITHOUT CONTRAST  TECHNIQUE: Multidetector CT imaging of the abdomen and pelvis was performed following the standard protocol without IV contrast.  COMPARISON:  CT 10/03/2013  FINDINGS: Visualization of the lower thorax demonstrates right middle lobe scarring. No pleural effusion. Normal heart size.  The non contrast-enhanced liver is grossly unremarkable. Unchanged hepatic parenchymal calcifications. Gallbladder is unremarkable. Spleen, pancreas and bilateral adrenal glands are unremarkable. Unchanged cyst within the interpolar region the right  kidney. Punctate calcification within the inferior pole of the right kidney.  Interval increase in size of left parapelvic cyst measuring 5.4 x 4.7 cm, previously 4.4 x 4.1 cm. Re- demonstrated 4 mm stone within the inferior pole of the left kidney. There is left perinephric fat stranding, stable from prior. Mild left hydroureteronephrosis to the level of the pelvis were there is an obstructing 3 mm distal left ureteral stone and an additional 3 mm stone at the UVJ.  Infrarenal abdominal aortic ectasia measuring up to 3 cm, stable from prior. No retroperitoneal adenopathy. Urinary bladder is unremarkable. Prostate is unremarkable.  Extensive descending colonic sigmoid diverticulosis. No CT evidence for acute diverticulitis. Stool throughout the colon. Normal appendix. No abnormal small bowel wall thickening. No evidence for bowel obstruction.  Right hip arthroplasty. No aggressive or acute appearing osseous lesions.  IMPRESSION: Obstructing 3 mm stone within the distal left ureter and an additional stone within the left UVJ with mild left hydronephrosis.   Electronically Signed   By: Lovey Newcomer M.D.   On: 02/15/2014 20:02      MDM   Final diagnoses:  Kidney stone  Renal insufficiency    Bedside ultrasound reveals that there appears to be some cysts in the left ovary versus hydronephrosis, the patient has increasingly concerning symptoms for large kidney stone, imaging reviewed prior to May lithotripsy, will need repeat CT scan to rule out other sources as the patient does have some right lower quadrant tenderness as well and may have diverticulitis rather than ureteral or nephrolithiasis. Pain medication ordered, labs, reevaluate.  CT d/w pt - 52mm distal ureter.  Meds given in ED:  Medications  ondansetron (ZOFRAN) injection 4 mg (4 mg Intravenous Given 02/15/14 1836)  ketorolac (TORADOL) 30 MG/ML injection 30 mg (30 mg Intravenous Given 02/15/14 1836)    New Prescriptions    HYDROCODONE-ACETAMINOPHEN (NORCO/VICODIN) 5-325 MG PER TABLET    Take 2 tablets by mouth every 4 (four) hours as needed.   HYDROCODONE-ACETAMINOPHEN (NORCO/VICODIN) 5-325 MG PER TABLET    Take 2 tablets by mouth every 4 (four) hours as needed for moderate pain.   NAPROXEN (NAPROSYN)  500 MG TABLET    Take 1 tablet (500 mg total) by mouth 2 (two) times daily with a meal.   ONDANSETRON (ZOFRAN ODT) 4 MG DISINTEGRATING TABLET    Take 1 tablet (4 mg total) by mouth every 8 (eight) hours as needed for nausea.   TAMSULOSIN (FLOMAX) 0.4 MG CAPS CAPSULE    Take 1 capsule (0.4 mg total) by mouth 2 (two) times daily.      Johnna Acosta, MD 02/15/14 2103

## 2014-02-15 NOTE — ED Notes (Addendum)
Pt reports right flank pain, with nausea. States pain is increasing more severe. Pt denies any urinary sx at this time. Pt reports he recently had lithotripsy and just had a followup appt with urology who stated they did not see any stones at that time.

## 2014-03-04 DIAGNOSIS — Z23 Encounter for immunization: Secondary | ICD-10-CM | POA: Diagnosis not present

## 2014-03-14 DIAGNOSIS — I10 Essential (primary) hypertension: Secondary | ICD-10-CM | POA: Diagnosis not present

## 2014-03-18 DIAGNOSIS — J069 Acute upper respiratory infection, unspecified: Secondary | ICD-10-CM | POA: Diagnosis not present

## 2014-03-18 DIAGNOSIS — I10 Essential (primary) hypertension: Secondary | ICD-10-CM | POA: Diagnosis not present

## 2014-03-19 DIAGNOSIS — J069 Acute upper respiratory infection, unspecified: Secondary | ICD-10-CM | POA: Diagnosis not present

## 2014-03-19 DIAGNOSIS — J40 Bronchitis, not specified as acute or chronic: Secondary | ICD-10-CM | POA: Diagnosis not present

## 2014-03-19 DIAGNOSIS — J329 Chronic sinusitis, unspecified: Secondary | ICD-10-CM | POA: Diagnosis not present

## 2014-03-31 ENCOUNTER — Other Ambulatory Visit: Payer: Self-pay

## 2014-03-31 DIAGNOSIS — L570 Actinic keratosis: Secondary | ICD-10-CM | POA: Diagnosis not present

## 2014-03-31 DIAGNOSIS — D485 Neoplasm of uncertain behavior of skin: Secondary | ICD-10-CM | POA: Diagnosis not present

## 2014-05-16 DIAGNOSIS — H00022 Hordeolum internum right lower eyelid: Secondary | ICD-10-CM | POA: Diagnosis not present

## 2014-05-19 DIAGNOSIS — H00022 Hordeolum internum right lower eyelid: Secondary | ICD-10-CM | POA: Diagnosis not present

## 2014-05-27 DIAGNOSIS — H0012 Chalazion right lower eyelid: Secondary | ICD-10-CM | POA: Diagnosis not present

## 2014-07-22 DIAGNOSIS — E039 Hypothyroidism, unspecified: Secondary | ICD-10-CM | POA: Diagnosis not present

## 2014-07-22 DIAGNOSIS — I10 Essential (primary) hypertension: Secondary | ICD-10-CM | POA: Diagnosis not present

## 2014-07-24 DIAGNOSIS — J069 Acute upper respiratory infection, unspecified: Secondary | ICD-10-CM | POA: Diagnosis not present

## 2014-07-24 DIAGNOSIS — I10 Essential (primary) hypertension: Secondary | ICD-10-CM | POA: Diagnosis not present

## 2014-07-24 DIAGNOSIS — E039 Hypothyroidism, unspecified: Secondary | ICD-10-CM | POA: Diagnosis not present

## 2014-07-24 DIAGNOSIS — K5900 Constipation, unspecified: Secondary | ICD-10-CM | POA: Diagnosis not present

## 2014-09-11 ENCOUNTER — Telehealth: Payer: Self-pay | Admitting: Family

## 2014-09-11 NOTE — Telephone Encounter (Signed)
-----   Message from Mena Goes, RN sent at 09/10/2014  4:16 PM EDT ----- Regarding: Move up appts if possible See if his 10-17-14 appts with Vinnie Level can be moved up per phone call from his wife today. Thanks.

## 2014-09-11 NOTE — Telephone Encounter (Signed)
I spoke with the patients wife today, and she has changed her mind. She wanted to leave his appointment for 10/17/14. She knows to call if he develops any problems before that time, dpm

## 2014-10-08 ENCOUNTER — Other Ambulatory Visit: Payer: Self-pay | Admitting: *Deleted

## 2014-10-08 DIAGNOSIS — I6523 Occlusion and stenosis of bilateral carotid arteries: Secondary | ICD-10-CM

## 2014-10-16 ENCOUNTER — Encounter: Payer: Self-pay | Admitting: Family

## 2014-10-17 ENCOUNTER — Ambulatory Visit (INDEPENDENT_AMBULATORY_CARE_PROVIDER_SITE_OTHER): Payer: Medicare Other | Admitting: Family

## 2014-10-17 ENCOUNTER — Encounter: Payer: Self-pay | Admitting: Family

## 2014-10-17 ENCOUNTER — Ambulatory Visit (HOSPITAL_COMMUNITY)
Admission: RE | Admit: 2014-10-17 | Discharge: 2014-10-17 | Disposition: A | Discharge status: Home / Self Care | Payer: Medicare Other | Source: Ambulatory Visit | Attending: Family | Admitting: Family

## 2014-10-17 VITALS — BP 148/73 | HR 56 | Resp 14 | Ht 70.0 in | Wt 150.0 lb

## 2014-10-17 DIAGNOSIS — I6521 Occlusion and stenosis of right carotid artery: Secondary | ICD-10-CM | POA: Diagnosis not present

## 2014-10-17 DIAGNOSIS — Z9889 Other specified postprocedural states: Secondary | ICD-10-CM | POA: Diagnosis not present

## 2014-10-17 DIAGNOSIS — I6523 Occlusion and stenosis of bilateral carotid arteries: Secondary | ICD-10-CM

## 2014-10-17 DIAGNOSIS — Z48812 Encounter for surgical aftercare following surgery on the circulatory system: Secondary | ICD-10-CM

## 2014-10-17 DIAGNOSIS — Z8673 Personal history of transient ischemic attack (TIA), and cerebral infarction without residual deficits: Secondary | ICD-10-CM

## 2014-10-17 NOTE — Progress Notes (Signed)
Established Carotid Patient   History of Present Illness  Jorge Nixon is a 81 y.o. male patient of Dr. Bridgett Larsson who returns for follow up s/p L CEA (Date: 03/11/13).  He had 2 TIA's within a week before the left CEA as manifested by right arm tingling and weakness that resolved within a few minutes, no further TIA or stroke activity. Pt denies any amaurosis fugax, denies aphasia, denies confusion.  He walks a mile, five days/week.   Pt reports New Medical or Surgical History: none  Pt Diabetic: No Pt smoker: non-smoker  Pt meds include: Statin : No ASA: yes, daily 81 mg Other anticoagulants/antiplatelets: no   Past Medical History  Diagnosis Date  . Hypertension   . Diverticular disease 02/14/2011    problems swallowing  . GERD (gastroesophageal reflux disease)   . Coronary artery disease   . Kidney stone   . Hypothyroidism   . Anemia   . COPD (chronic obstructive pulmonary disease)   . Non-ischemic cardiomyopathy     mild  EF on echo 05/03/2012 55 to 60%  . Nephrolithiasis     lithotripsy 2006  . BPH (benign prostatic hyperplasia)   . TIA (transient ischemic attack)   . Shortness of breath     exertion  . Headache(784.0)   . Dizziness   . Arthritis   . History of blood transfusion   . Pneumonia     hx of - hospitalized  . Carotid artery occlusion   . Stroke 03-11-13  . DVT (deep venous thrombosis)     Social History History  Substance Use Topics  . Smoking status: Never Smoker   . Smokeless tobacco: Never Used  . Alcohol Use: No    Family History Family History  Problem Relation Age of Onset  . Stroke Father   . Heart attack Father   . Hypertension    . Diabetes    . Hypertension Mother     Surgical History Past Surgical History  Procedure Laterality Date  . Total hip arthroplasty Right august 6th 2012    right side   . Replacement total knee Left 2006    left knee  . Transurethral resection of prostate  1996  . Knee arthroscopy Right 2010   . Joint replacement Right     hip  . Joint replacement Left     knee  . Carpel tunnel Right   . Endarterectomy Left 03/11/2013    Procedure: ENDARTERECTOMY CAROTID with Vascu-Guard Bovine Patch.;  Surgeon: Conrad , MD;  Location: [facility];  Service: Vascular;  Laterality: Left;  . Carotid endarterectomy Left 03-11-13    cea    Allergies  Allergen Reactions  . Sulfa Antibiotics Rash  . Milk-Related Compounds Other (See Comments)    Upset stomach    Current Outpatient Prescriptions  Medication Sig Dispense Refill  . acetaminophen (TYLENOL) 500 MG tablet Take 1,000 mg by mouth every 6 (six) hours as needed for pain.     [name] aspirin EC 81 MG tablet Take 81 mg by mouth every morning.    . fish oil-omega-3 fatty acids 1000 MG capsule Take 1 g by mouth every morning.     [name] HYDROcodone-acetaminophen (NORCO/VICODIN) 5-325 MG per tablet Take 2 tablets by mouth every 4 (four) hours as needed. 20 tablet 0  . HYDROcodone-acetaminophen (NORCO/VICODIN) 5-325 MG per tablet Take 2 tablets by mouth every 4 (four) hours as needed for moderate pain. 6 tablet 0  . levothyroxine (SYNTHROID, LEVOTHROID) 50 MCG  tablet Take 50 mcg by mouth daily before breakfast.    . losartan-hydrochlorothiazide (HYZAAR) 50-12.5 MG per tablet Take 0.5 tablets by mouth every morning.    . naproxen (NAPROSYN) 500 MG tablet Take 1 tablet (500 mg total) by mouth 2 (two) times daily with a meal. 30 tablet 0  . Nutritional Supplements (ENSURE COMPLETE PO) Take 237 mLs by mouth 2 (two) times daily as needed (For nutritional supplement.).    [name] ondansetron (ZOFRAN ODT) 4 MG disintegrating tablet Take 1 tablet (4 mg total) by mouth every 8 (eight) hours as needed for nausea. 10 tablet 0  . ondansetron (ZOFRAN) 4 MG tablet Take 1 tablet (4 mg total) by mouth every 6 (six) hours as needed for nausea. 20 tablet 0  . Oxycodone HCl 10 MG TABS Take 1 tablet (10 mg total) by mouth every 4 (four) hours as needed. 30 tablet 0  . tamsulosin  (FLOMAX) 0.4 MG CAPS capsule Take 0.4 mg by mouth every morning.    . tamsulosin (FLOMAX) 0.4 MG CAPS capsule Take 1 capsule (0.4 mg total) by mouth 2 (two) times daily. 10 capsule 0   No current facility-administered medications for this visit.    Review of Systems : See HPI for pertinent positives and negatives.  Physical Examination  Filed Vitals:   10/17/14 0959 10/17/14 1004  BP: 156/80 148/73  Pulse: 58 56  Resp:  14  Height:  5\' 10"  (1.778 m)  Weight:  150 lb (68.04 kg)  SpO2:  100%   Body mass index is 21.52 kg/(m^2).   General: WDWN male in NAD GAIT: using quad cane, slow, deliberate Eyes: PERRLA Pulmonary: Non-labored, CTAB, no rales, rhonchi, or wheezing.  Cardiac: Regular rhythm, no detected murmur.  VASCULAR EXAM Carotid Bruits Left Right   Negative Negative   Aorta is not palpable. Radial pulses are are 2+ palpable and =.      LE Pulses LEFT RIGHT   POPLITEAL not palpable  not palpable   POSTERIOR TIBIAL not palpable  not palpable    DORSALIS PEDIS  ANTERIOR TIBIAL 1+ palpable  1+ palpable     Gastrointestinal: soft, nontender, BS WNL, no r/g,no palpable masses.  Musculoskeletal: Negative muscle atrophy/wasting. M/S 4/5 in UE's, 3/5 in LE's, Extremities without ischemic changes.  Neurologic: A&O X 3; Appropriate Affect, Speech is normal CN 2-12 intactexcept has significant hearing loss, Pain and light touch intact in extremities, Motor exam as listed above.         Non-Invasive Vascular Imaging CAROTID DUPLEX 10/17/2014 CEREBROVASCULAR DUPLEX EVALUATION    INDICATION: Carotid endarterectomy     PREVIOUS INTERVENTION(S): Left carotid endarterectomy on 03/11/13    DUPLEX EXAM:     RIGHT  LEFT  Peak Systolic Velocities (cm/s) End Diastolic Velocities (cm/s) Plaque  LOCATION Peak Systolic Velocities (cm/s) End Diastolic Velocities (cm/s) Plaque  72 12  CCA PROXIMAL 73 16   59 13  CCA MID 72 14   61 15 HT CCA DISTAL 110 21 HM  75 6 HT ECA 128 12   68 18 HT ICA PROXIMAL 114 16   67 23  ICA MID 67 21   87 29  ICA DISTAL 55 17     1.1 ICA / CCA Ratio (PSV) Not Calculated  Antegrade Vertebral Flow Antegrade  825 Brachial Systolic Pressure (mmHg) 053  Multiphasic (subclavian artery) Brachial Artery Waveforms Multiphasic (subclavian artery)    Plaque Morphology:  HM = Homogeneous, HT = Heterogeneous, CP = Calcific Plaque, SP =  Smooth Plaque, IP = Irregular Plaque  ADDITIONAL FINDINGS: No significant stenosis of the bilateral external or common carotid arteries.    IMPRESSION: 1. Patent left carotid endarterectomy site with no left internal carotid artery stenosis. 2. Doppler velocities suggest a 1-49% stenosis of the right proximal internal carotid artery.    Compared to the previous exam:  No significant change in the bilateral carotid velocities noted when compared to the previous exam on 10/11/13.       Assessment: Jorge Nixon is a 81 y.o. male who is s/p L CEA (Date: 03/11/13); during surgery it was found that he had carotid stenosis >70%. He had 2 TIA's within a week before the left CEA as manifested by right arm tingling and weakness that resolved within a few minutes, no further TIA or stroke activity.  Today's  Carotid Duplex suggests 1-49% stenosis of the right proximal internal carotid artery and a patent left carotid endarterectomy site with no left internal carotid artery stenosis. No significant change in the bilateral carotid velocities noted when compared to the previous exam on 10/11/13.  Plan: Follow-up in 1 year with Carotid Duplex.   I discussed in depth with the patient the nature of atherosclerosis, and emphasized the importance of maximal medical management including strict control of blood pressure, blood glucose, and lipid  levels, obtaining regular exercise, and continued cessation of smoking.  The patient is aware that without maximal medical management the underlying atherosclerotic disease process will progress, limiting the benefit of any interventions. The patient was given information about stroke prevention and what symptoms should prompt the patient to seek immediate medical care. Thank you for allowing [place] to participate in this patient's care.  Clemon Chambers, RN, MSN, FNP-C [facility] Office: [phone]  Clinic Physician: Bridgett Larsson  10/17/2014 10:05 AM

## 2014-10-17 NOTE — Addendum Note (Signed)
Addended by: Reola Calkins on: 10/17/2014 02:13 PM   Modules accepted: Orders

## 2014-10-17 NOTE — Patient Instructions (Signed)
Stroke Prevention Some medical conditions and behaviors are associated with an increased chance of having a stroke. You may prevent a stroke by making healthy choices and managing medical conditions. HOW CAN I REDUCE MY RISK OF HAVING A STROKE?   Stay physically active. Get at least 30 minutes of activity on most or all days.  Do not smoke. It may also be helpful to avoid exposure to secondhand smoke.  Limit alcohol use. Moderate alcohol use is considered to be:  No more than 2 drinks per day for men.  No more than 1 drink per day for nonpregnant women.  Eat healthy foods. This involves:  Eating 5 or more servings of fruits and vegetables a day.  Making dietary changes that address high blood pressure (hypertension), high cholesterol, diabetes, or obesity.  Manage your cholesterol levels.  Making food choices that are high in fiber and low in saturated fat, trans fat, and cholesterol may control cholesterol levels.  Take any prescribed medicines to control cholesterol as directed by your health care provider.  Manage your diabetes.  Controlling your carbohydrate and sugar intake is recommended to manage diabetes.  Take any prescribed medicines to control diabetes as directed by your health care provider.  Control your hypertension.  Making food choices that are low in salt (sodium), saturated fat, trans fat, and cholesterol is recommended to manage hypertension.  Take any prescribed medicines to control hypertension as directed by your health care provider.  Maintain a healthy weight.  Reducing calorie intake and making food choices that are low in sodium, saturated fat, trans fat, and cholesterol are recommended to manage weight.  Stop drug abuse.  Avoid taking birth control pills.  Talk to your health care provider about the risks of taking birth control pills if you are over 35 years old, smoke, get migraines, or have ever had a blood clot.  Get evaluated for sleep  disorders (sleep apnea).  Talk to your health care provider about getting a sleep evaluation if you snore a lot or have excessive sleepiness.  Take medicines only as directed by your health care provider.  For some people, aspirin or blood thinners (anticoagulants) are helpful in reducing the risk of forming abnormal blood clots that can lead to stroke. If you have the irregular heart rhythm of atrial fibrillation, you should be on a blood thinner unless there is a good reason you cannot take them.  Understand all your medicine instructions.  Make sure that other conditions (such as anemia or atherosclerosis) are addressed. SEEK IMMEDIATE MEDICAL CARE IF:   You have sudden weakness or numbness of the face, arm, or leg, especially on one side of the body.  Your face or eyelid droops to one side.  You have sudden confusion.  You have trouble speaking (aphasia) or understanding.  You have sudden trouble seeing in one or both eyes.  You have sudden trouble walking.  You have dizziness.  You have a loss of balance or coordination.  You have a sudden, severe headache with no known cause.  You have new chest pain or an irregular heartbeat. Any of these symptoms may represent a serious problem that is an emergency. Do not wait to see if the symptoms will go away. Get medical help at once. Call your local emergency services (911 in [place] Do not drive yourself to the hospital. Document Released: 06/23/2004 Document Revised: 09/30/2013 Document Reviewed: 11/16/2012 Exit[employer] Patient Information 2015 [employer]. This information is not intended to replace advice given   to you by your health care provider. Make sure you discuss any questions you have with your health care provider.  

## 2014-10-23 DIAGNOSIS — L570 Actinic keratosis: Secondary | ICD-10-CM | POA: Diagnosis not present

## 2014-10-23 DIAGNOSIS — X32XXXD Exposure to sunlight, subsequent encounter: Secondary | ICD-10-CM | POA: Diagnosis not present

## 2014-10-23 DIAGNOSIS — L821 Other seborrheic keratosis: Secondary | ICD-10-CM | POA: Diagnosis not present

## 2014-10-23 DIAGNOSIS — D485 Neoplasm of uncertain behavior of skin: Secondary | ICD-10-CM | POA: Diagnosis not present

## 2014-10-23 DIAGNOSIS — D1801 Hemangioma of skin and subcutaneous tissue: Secondary | ICD-10-CM | POA: Diagnosis not present

## 2015-01-26 DIAGNOSIS — I10 Essential (primary) hypertension: Secondary | ICD-10-CM | POA: Diagnosis not present

## 2015-01-26 DIAGNOSIS — E039 Hypothyroidism, unspecified: Secondary | ICD-10-CM | POA: Diagnosis not present

## 2015-01-26 DIAGNOSIS — E782 Mixed hyperlipidemia: Secondary | ICD-10-CM | POA: Diagnosis not present

## 2015-01-28 DIAGNOSIS — I10 Essential (primary) hypertension: Secondary | ICD-10-CM | POA: Diagnosis not present

## 2015-01-28 DIAGNOSIS — Z23 Encounter for immunization: Secondary | ICD-10-CM | POA: Diagnosis not present

## 2015-01-28 DIAGNOSIS — E039 Hypothyroidism, unspecified: Secondary | ICD-10-CM | POA: Diagnosis not present

## 2015-02-09 ENCOUNTER — Emergency Department (HOSPITAL_COMMUNITY): Payer: Medicare Other

## 2015-02-09 ENCOUNTER — Encounter (HOSPITAL_COMMUNITY): Payer: Self-pay

## 2015-02-09 ENCOUNTER — Emergency Department (HOSPITAL_COMMUNITY)
Admission: EM | Admit: 2015-02-09 | Discharge: 2015-02-09 | Disposition: A | Discharge status: Home / Self Care | Payer: Medicare Other | Attending: Emergency Medicine | Admitting: Emergency Medicine

## 2015-02-09 DIAGNOSIS — J449 Chronic obstructive pulmonary disease, unspecified: Secondary | ICD-10-CM | POA: Insufficient documentation

## 2015-02-09 DIAGNOSIS — M1990 Unspecified osteoarthritis, unspecified site: Secondary | ICD-10-CM | POA: Diagnosis not present

## 2015-02-09 DIAGNOSIS — N400 Enlarged prostate without lower urinary tract symptoms: Secondary | ICD-10-CM | POA: Insufficient documentation

## 2015-02-09 DIAGNOSIS — Z8673 Personal history of transient ischemic attack (TIA), and cerebral infarction without residual deficits: Secondary | ICD-10-CM | POA: Diagnosis not present

## 2015-02-09 DIAGNOSIS — R079 Chest pain, unspecified: Secondary | ICD-10-CM | POA: Diagnosis present

## 2015-02-09 DIAGNOSIS — J159 Unspecified bacterial pneumonia: Secondary | ICD-10-CM | POA: Diagnosis not present

## 2015-02-09 DIAGNOSIS — I10 Essential (primary) hypertension: Secondary | ICD-10-CM | POA: Diagnosis not present

## 2015-02-09 DIAGNOSIS — Z87442 Personal history of urinary calculi: Secondary | ICD-10-CM | POA: Diagnosis not present

## 2015-02-09 DIAGNOSIS — Z79899 Other long term (current) drug therapy: Secondary | ICD-10-CM | POA: Insufficient documentation

## 2015-02-09 DIAGNOSIS — Z86718 Personal history of other venous thrombosis and embolism: Secondary | ICD-10-CM | POA: Insufficient documentation

## 2015-02-09 DIAGNOSIS — R509 Fever, unspecified: Secondary | ICD-10-CM | POA: Insufficient documentation

## 2015-02-09 DIAGNOSIS — E039 Hypothyroidism, unspecified: Secondary | ICD-10-CM | POA: Diagnosis not present

## 2015-02-09 DIAGNOSIS — Z8701 Personal history of pneumonia (recurrent): Secondary | ICD-10-CM | POA: Insufficient documentation

## 2015-02-09 DIAGNOSIS — J189 Pneumonia, unspecified organism: Secondary | ICD-10-CM

## 2015-02-09 DIAGNOSIS — I2510 Atherosclerotic heart disease of native coronary artery without angina pectoris: Secondary | ICD-10-CM | POA: Diagnosis not present

## 2015-02-09 DIAGNOSIS — Z7982 Long term (current) use of aspirin: Secondary | ICD-10-CM | POA: Diagnosis not present

## 2015-02-09 DIAGNOSIS — K5730 Diverticulosis of large intestine without perforation or abscess without bleeding: Secondary | ICD-10-CM | POA: Diagnosis not present

## 2015-02-09 LAB — LACTIC ACID, PLASMA: Lactic Acid, Venous: 2 mmol/L (ref 0.5–2.0)

## 2015-02-09 LAB — URINALYSIS, ROUTINE W REFLEX MICROSCOPIC
Bilirubin Urine: NEGATIVE
Glucose, UA: NEGATIVE mg/dL
Hgb urine dipstick: NEGATIVE
Ketones, ur: NEGATIVE mg/dL
Leukocytes, UA: NEGATIVE
Nitrite: NEGATIVE
PROTEIN: NEGATIVE mg/dL
SPECIFIC GRAVITY, URINE: 1.01 (ref 1.005–1.030)
UROBILINOGEN UA: 0.2 mg/dL (ref 0.0–1.0)
pH: 6.5 (ref 5.0–8.0)

## 2015-02-09 LAB — CBC WITH DIFFERENTIAL/PLATELET
BASOS ABS: 0 10*3/uL (ref 0.0–0.1)
BASOS PCT: 0 % (ref 0–1)
Eosinophils Absolute: 0 10*3/uL (ref 0.0–0.7)
Eosinophils Relative: 0 % (ref 0–5)
HCT: 37.2 % — ABNORMAL LOW (ref 39.0–52.0)
Hemoglobin: 12.4 g/dL — ABNORMAL LOW (ref 13.0–17.0)
LYMPHS PCT: 5 % — AB (ref 12–46)
Lymphs Abs: 0.4 10*3/uL — ABNORMAL LOW (ref 0.7–4.0)
MCH: 30 pg (ref 26.0–34.0)
MCHC: 33.3 g/dL (ref 30.0–36.0)
MCV: 90.1 fL (ref 78.0–100.0)
MONO ABS: 0 10*3/uL — AB (ref 0.1–1.0)
Monocytes Relative: 0 % — ABNORMAL LOW (ref 3–12)
NEUTROS PCT: 95 % — AB (ref 43–77)
Neutro Abs: 6.7 10*3/uL (ref 1.7–7.7)
Platelets: 145 10*3/uL — ABNORMAL LOW (ref 150–400)
RBC: 4.13 MIL/uL — ABNORMAL LOW (ref 4.22–5.81)
RDW: 13.9 % (ref 11.5–15.5)
WBC: 7.1 10*3/uL (ref 4.0–10.5)

## 2015-02-09 LAB — COMPREHENSIVE METABOLIC PANEL WITH GFR
ALT: 15 U/L — ABNORMAL LOW (ref 17–63)
AST: 32 U/L (ref 15–41)
Albumin: 4.1 g/dL (ref 3.5–5.0)
Alkaline Phosphatase: 55 U/L (ref 38–126)
Anion gap: 6 (ref 5–15)
BUN: 21 mg/dL — ABNORMAL HIGH (ref 6–20)
CO2: 29 mmol/L (ref 22–32)
Calcium: 8.8 mg/dL — ABNORMAL LOW (ref 8.9–10.3)
Chloride: 102 mmol/L (ref 101–111)
Creatinine, Ser: 1.09 mg/dL (ref 0.61–1.24)
GFR calc Af Amer: >60 mL/min
GFR calc non Af Amer: 59 mL/min — ABNORMAL LOW
Glucose, Bld: 90 mg/dL (ref 65–99)
Potassium: 3.7 mmol/L (ref 3.5–5.1)
Sodium: 137 mmol/L (ref 135–145)
Total Bilirubin: 0.8 mg/dL (ref 0.3–1.2)
Total Protein: 7.3 g/dL (ref 6.5–8.1)

## 2015-02-09 LAB — LIPASE, BLOOD: Lipase: 29 U/L (ref 22–51)

## 2015-02-09 LAB — TROPONIN I: Troponin I: <0.03 ng/mL

## 2015-02-09 MED ORDER — IOHEXOL 300 MG/ML  SOLN
100.0000 mL | Freq: Once | INTRAMUSCULAR | Status: AC | PRN
Start: 2015-02-09 — End: 2015-02-09
  Administered 2015-02-09: 100 mL via INTRAVENOUS

## 2015-02-09 MED ORDER — LEVOFLOXACIN 500 MG PO TABS: 500.0000 mg | ORAL_TABLET | Freq: Every day | ORAL | Status: DC

## 2015-02-09 MED ORDER — IOHEXOL 300 MG/ML  SOLN
50.0000 mL | Freq: Once | INTRAMUSCULAR | Status: AC | PRN
  Administered 2015-02-09: 50 mL via ORAL

## 2015-02-09 MED ORDER — LEVOFLOXACIN 500 MG PO TABS
500.0000 mg | ORAL_TABLET | Freq: Once | ORAL | Status: AC
  Administered 2015-02-09: 500 mg via ORAL
  Filled 2015-02-09: qty 1

## 2015-02-09 MED ORDER — ACETAMINOPHEN 325 MG PO TABS
650.0000 mg | ORAL_TABLET | Freq: Once | ORAL | Status: AC
  Administered 2015-02-09: 650 mg via ORAL
  Filled 2015-02-09: qty 2

## 2015-02-09 NOTE — ED Notes (Signed)
Pt reports woke up this morning with chills and pain in chest and r arm.  Denies cough or fever.

## 2015-02-09 NOTE — ED Provider Notes (Signed)
CSN: [id number]     Arrival date & time 02/09/15  0908 History  This chart was scribed for Jola Schmidt, MD by Terressa Koyanagi, ED Scribe. This patient was seen in room APA09/APA09 and the patient's care was started at 9:33 AM.   Chief Complaint  Patient presents with  . Chest Pain  . Chills   The history is provided by the patient. No language interpreter was used.   PCP: Delphina Cahill, MD HPI Comments: Jorge Nixon is a 81 y.o. male, accompanied by his wife, with PMHx noted below including HTN, CAD, TIA, stroke, and carotid artery occlusion, who presents to the Emergency Department complaining of mid-sternal chest pain with associated right arm pain, chills and epigastric abd pain upon awakening this morning. Pt reports he was at baseline yesterday. Pt denies cough, fever, n/v/d, cough, urinary Sx, SOB, or any other Sx at this time.   Past Medical History  Diagnosis Date  . Hypertension   . Diverticular disease 02/14/2011    problems swallowing  . GERD (gastroesophageal reflux disease)   . Coronary artery disease   . Kidney stone   . Hypothyroidism   . Anemia   . COPD (chronic obstructive pulmonary disease)   . Non-ischemic cardiomyopathy     mild  EF on echo 05/03/2012 55 to 60%  . Nephrolithiasis     lithotripsy 2006  . BPH (benign prostatic hyperplasia)   . TIA (transient ischemic attack)   . Shortness of breath     exertion  . Headache(784.0)   . Dizziness   . Arthritis   . History of blood transfusion   . Pneumonia     hx of - hospitalized  . Carotid artery occlusion   . Stroke 03-11-13  . DVT (deep venous thrombosis)    Past Surgical History  Procedure Laterality Date  . Total hip arthroplasty Right august 6th 2012    right side   . Replacement total knee Left 2006    left knee  . Transurethral resection of prostate  1996  . Knee arthroscopy Right 2010  . Joint replacement Right     hip  . Joint replacement Left     knee  . Carpel tunnel Right   .  Endarterectomy Left 03/11/2013    Procedure: ENDARTERECTOMY CAROTID with Vascu-Guard Bovine Patch.;  Surgeon: Conrad Togiak, MD;  Location: [facility];  Service: Vascular;  Laterality: Left;  . Carotid endarterectomy Left 03-11-13    cea   Family History  Problem Relation Age of Onset  . Stroke Father   . Heart attack Father   . Hypertension    . Diabetes    . Hypertension Mother    Social History  Substance Use Topics  . Smoking status: Never Smoker   . Smokeless tobacco: Never Used  . Alcohol Use: No    Review of Systems  Constitutional: Positive for chills. Negative for fever.  Respiratory: Negative for cough and shortness of breath.   Cardiovascular: Positive for chest pain.  Gastrointestinal: Positive for abdominal pain. Negative for nausea, vomiting and diarrhea.  Genitourinary: Negative for dysuria, frequency, decreased urine volume and difficulty urinating.  All other systems reviewed and are negative.  Allergies  Sulfa antibiotics and Milk-related compounds  Home Medications   Prior to Admission medications   Medication Sig Start Date End Date Taking? Authorizing Provider  acetaminophen (TYLENOL) 500 MG tablet Take 1,000 mg by mouth every 6 (six) hours as needed for pain.  Historical Provider, MD  aspirin EC 81 MG tablet Take 81 mg by mouth every morning.    Historical Provider, MD  Bisacodyl (LAXATIVE PO) Take 8.6 mg by mouth at bedtime as needed and may repeat dose one time if needed.    Historical Provider, MD  Digestive Enzymes (DIGESTIVE ENZYME PO) Take by mouth. Advantage Gummies Take 2 daily    Historical Provider, MD  docusate sodium (COLACE) 250 MG capsule Take 250 mg by mouth at bedtime as needed and may repeat dose one time if needed for constipation.    Historical Provider, MD  fish oil-omega-3 fatty acids 1000 MG capsule Take 300 mg by mouth every morning.     Historical Provider, MD  HYDROcodone-acetaminophen (NORCO/VICODIN) 5-325 MG per tablet Take 2  tablets by mouth every 4 (four) hours as needed. Patient not taking: Reported on 10/17/2014 02/15/14   Noemi Chapel, MD  HYDROcodone-acetaminophen (NORCO/VICODIN) 5-325 MG per tablet Take 2 tablets by mouth every 4 (four) hours as needed for moderate pain. Patient not taking: Reported on 10/17/2014 02/15/14   Noemi Chapel, MD  levothyroxine (SYNTHROID, LEVOTHROID) 50 MCG tablet Take 75 mcg by mouth daily before breakfast.     Historical Provider, MD  levothyroxine (SYNTHROID, LEVOTHROID) 75 MCG tablet  10/02/14   Historical Provider, MD  losartan-hydrochlorothiazide (HYZAAR) 50-12.5 MG per tablet Take 0.5 tablets by mouth every morning.    Historical Provider, MD  naproxen (NAPROSYN) 500 MG tablet Take 1 tablet (500 mg total) by mouth 2 (two) times daily with a meal. Patient not taking: Reported on 10/17/2014 02/15/14   Noemi Chapel, MD  Nutritional Supplements (ENSURE COMPLETE PO) Take 237 mLs by mouth. Take every other day    Historical Provider, MD  ondansetron (ZOFRAN ODT) 4 MG disintegrating tablet Take 1 tablet (4 mg total) by mouth every 8 (eight) hours as needed for nausea. Patient not taking: Reported on 10/17/2014 02/15/14   Noemi Chapel, MD  ondansetron (ZOFRAN) 4 MG tablet Take 1 tablet (4 mg total) by mouth every 6 (six) hours as needed for nausea. Patient not taking: Reported on 10/17/2014 07/03/24   Delora Fuel, MD  Oxycodone HCl 10 MG TABS Take 1 tablet (10 mg total) by mouth every 4 (four) hours as needed. Patient not taking: Reported on 10/17/2014 10/07/13   Kathie Rhodes, MD  Psyllium 28.3 % POWD Take by mouth. Two tsp daily    Historical Provider, MD  tamsulosin (FLOMAX) 0.4 MG CAPS capsule Take 0.4 mg by mouth every morning.    Historical Provider, MD  tamsulosin (FLOMAX) 0.4 MG CAPS capsule Take 1 capsule (0.4 mg total) by mouth 2 (two) times daily. Patient not taking: Reported on 10/17/2014 02/15/14   Noemi Chapel, MD   Triage Vitals: BP 114/76 mmHg  Pulse 86  Temp(Src) 98.2 F (36.8 C)  (Oral)  Resp 20  Ht 5\' 11"  (1.803 m)  Wt 148 lb (67.132 kg)  BMI 20.65 kg/m2  SpO2 98% Physical Exam  Constitutional: He is oriented to person, place, and time. He appears well-developed and well-nourished.  HENT:  Head: Normocephalic and atraumatic.  Eyes: EOM are normal.  Neck: Normal range of motion.  Cardiovascular: Normal rate, regular rhythm, normal heart sounds and intact distal pulses.   Pulmonary/Chest: Effort normal and breath sounds normal. No respiratory distress.  Abdominal: Soft. He exhibits no distension. There is tenderness in the epigastric area and left lower quadrant.  Musculoskeletal: Normal range of motion.  Neurological: He is alert and oriented to person,  place, and time.  Skin: Skin is warm and dry.  Psychiatric: He has a normal mood and affect. Judgment normal.  Nursing note and vitals reviewed.   ED Course  Procedures (including critical care time) DIAGNOSTIC STUDIES: Oxygen Saturation is 98% on RA, nl by my interpretation.    COORDINATION OF CARE: 9:36 AM: Discussed treatment plan which includes imaging and meds with pt at bedside; patient verbalizes understanding and agrees with treatment plan.  Labs Review Labs Reviewed  CBC WITH DIFFERENTIAL/PLATELET - Abnormal; Notable for the following:    RBC 4.13 (*)    Hemoglobin 12.4 (*)    HCT 37.2 (*)    Platelets 145 (*)    Neutrophils Relative % 95 (*)    Lymphocytes Relative 5 (*)    Lymphs Abs 0.4 (*)    Monocytes Relative 0 (*)    Monocytes Absolute 0.0 (*)    All other components within normal limits  COMPREHENSIVE METABOLIC PANEL - Abnormal; Notable for the following:    BUN 21 (*)    Calcium 8.8 (*)    ALT 15 (*)    GFR calc non Af Amer 59 (*)    All other components within normal limits  CULTURE, BLOOD (ROUTINE X 2)  CULTURE, BLOOD (ROUTINE X 2)  LIPASE, BLOOD  TROPONIN I  LACTIC ACID, PLASMA  URINALYSIS, ROUTINE W REFLEX MICROSCOPIC (NOT AT [facility])    Imaging Review Ct Abdomen  Pelvis W Contrast  02/09/2015   CLINICAL DATA:  Left upper and lower quadrant abdominal pain.  EXAM: CT ABDOMEN AND PELVIS WITH CONTRAST  TECHNIQUE: Multidetector CT imaging of the abdomen and pelvis was performed using the standard protocol following bolus administration of intravenous contrast.  CONTRAST:  49mL OMNIPAQUE IOHEXOL 300 MG/ML SOLN, 144mL OMNIPAQUE IOHEXOL 300 MG/ML SOLN  COMPARISON:  02/15/2014  FINDINGS: Densely calcified visualized right coronary artery. Heart is normal size. Scarring in the lingula and right middle lobe anteriorly. No effusions.  Calcifications within the liver dome, stable. Small hypodensity in the right hepatic dome, likely small cyst, also stable. Gallbladder, spleen, pancreas, adrenals are unremarkable. Bilateral renal cysts. No hydronephrosis.  Stomach is unremarkable. At least 2 duodenal diverticula noted, stable. Small bowel otherwise unremarkable.  There is sigmoid diverticulosis. Scattered right colonic diverticula. No active diverticulitis. Moderate stool in the colon. No free fluid, free air or adenopathy. Urinary bladder is decompressed, unremarkable.  Small umbilical hernias, containing fat on the left and small bowel loops on the right. No evidence of bowel obstruction.  Diffuse aortic and iliac calcifications. Ectasia of the infrarenal abdominal aorta, 3.3 cm compared with 3.1 cm previously.  Prior right hip replacement.  No acute bony abnormality.  IMPRESSION: Colonic diverticulosis, most pronounced in the sigmoid colon. No active diverticulitis.  No acute findings in the abdomen or pelvis.  Densely calcified right coronary artery.   Electronically Signed   By: Rolm Baptise M.D.   On: 02/09/2015 12:05   Dg Chest Portable 1 View  02/09/2015   CLINICAL DATA:  Chest pain, right arm pain and chills this morning.  EXAM: PORTABLE CHEST - 1 VIEW  COMPARISON:  Chest x-rays dated 02/22/2013 and 08/19/2011.  FINDINGS: Patchy airspace opacities are seen at the right lung  base suspicious for pneumonia. Lungs otherwise clear. No pleural effusion seen. No pneumothorax.  Cardiomediastinal silhouette is stable in size and configuration. No osseous abnormality.  IMPRESSION: Probable right lower lobe pneumonia.   Electronically Signed   By: Roxy Horseman.D.  On: 02/09/2015 09:43   I have personally reviewed and evaluated these images and lab results as part of my medical decision-making.   EKG Interpretation   Date/Time:  Monday February 09 2015 09:26:10 EDT Ventricular Rate:  81 PR Interval:    QRS Duration: 102 QT Interval:  376 QTC Calculation: 436 R Axis:   -22 Text Interpretation:  Atrial fibrillation Borderline left axis deviation  Anteroseptal infarct, age indeterminate No significant change was found  Confirmed by Quentyn Kolbeck  MD, Lennette Bihari ([id number]) on 02/09/2015 1:14:59 PM      MDM   Final diagnoses:  CAP (community acquired pneumonia)  Fever, unspecified fever cause    Pt feels much better at this time. Hemodynamically stable. Dc home with abx for CAP. abd benign. Pt and family understand to return to ER for new or worsening symptoms  I personally performed the services described in this documentation, which was scribed in my presence. The recorded information has been reviewed and is accurate.      Jola Schmidt, MD 02/09/15 1316

## 2015-02-09 NOTE — ED Notes (Signed)
Pt made aware to return if symptoms worsen or if any life threatening symptoms occur.   

## 2015-02-09 NOTE — Discharge Instructions (Signed)

## 2015-02-11 ENCOUNTER — Encounter (HOSPITAL_COMMUNITY): Payer: Self-pay

## 2015-02-11 ENCOUNTER — Emergency Department (HOSPITAL_COMMUNITY)
Admission: EM | Admit: 2015-02-11 | Discharge: 2015-02-11 | Disposition: A | Discharge status: Home / Self Care | Payer: Medicare Other | Attending: Emergency Medicine | Admitting: Emergency Medicine

## 2015-02-11 ENCOUNTER — Emergency Department (HOSPITAL_COMMUNITY): Payer: Medicare Other

## 2015-02-11 ENCOUNTER — Telehealth (HOSPITAL_COMMUNITY): Payer: Self-pay

## 2015-02-11 DIAGNOSIS — Z8673 Personal history of transient ischemic attack (TIA), and cerebral infarction without residual deficits: Secondary | ICD-10-CM | POA: Diagnosis not present

## 2015-02-11 DIAGNOSIS — J449 Chronic obstructive pulmonary disease, unspecified: Secondary | ICD-10-CM | POA: Diagnosis not present

## 2015-02-11 DIAGNOSIS — E039 Hypothyroidism, unspecified: Secondary | ICD-10-CM | POA: Diagnosis not present

## 2015-02-11 DIAGNOSIS — R05 Cough: Secondary | ICD-10-CM | POA: Diagnosis not present

## 2015-02-11 DIAGNOSIS — J159 Unspecified bacterial pneumonia: Secondary | ICD-10-CM | POA: Diagnosis not present

## 2015-02-11 DIAGNOSIS — R1031 Right lower quadrant pain: Secondary | ICD-10-CM | POA: Insufficient documentation

## 2015-02-11 DIAGNOSIS — I10 Essential (primary) hypertension: Secondary | ICD-10-CM | POA: Insufficient documentation

## 2015-02-11 DIAGNOSIS — M1990 Unspecified osteoarthritis, unspecified site: Secondary | ICD-10-CM | POA: Insufficient documentation

## 2015-02-11 DIAGNOSIS — Z8719 Personal history of other diseases of the digestive system: Secondary | ICD-10-CM | POA: Insufficient documentation

## 2015-02-11 DIAGNOSIS — Z86718 Personal history of other venous thrombosis and embolism: Secondary | ICD-10-CM | POA: Diagnosis not present

## 2015-02-11 DIAGNOSIS — J189 Pneumonia, unspecified organism: Secondary | ICD-10-CM

## 2015-02-11 DIAGNOSIS — Z7982 Long term (current) use of aspirin: Secondary | ICD-10-CM | POA: Diagnosis not present

## 2015-02-11 DIAGNOSIS — I2510 Atherosclerotic heart disease of native coronary artery without angina pectoris: Secondary | ICD-10-CM | POA: Diagnosis not present

## 2015-02-11 DIAGNOSIS — Z87438 Personal history of other diseases of male genital organs: Secondary | ICD-10-CM | POA: Insufficient documentation

## 2015-02-11 DIAGNOSIS — Z87442 Personal history of urinary calculi: Secondary | ICD-10-CM | POA: Insufficient documentation

## 2015-02-11 DIAGNOSIS — Z862 Personal history of diseases of the blood and blood-forming organs and certain disorders involving the immune mechanism: Secondary | ICD-10-CM | POA: Diagnosis not present

## 2015-02-11 DIAGNOSIS — Z79899 Other long term (current) drug therapy: Secondary | ICD-10-CM | POA: Insufficient documentation

## 2015-02-11 DIAGNOSIS — R5383 Other fatigue: Secondary | ICD-10-CM | POA: Diagnosis present

## 2015-02-11 LAB — CBC WITH DIFFERENTIAL/PLATELET
Basophils Absolute: 0 10*3/uL (ref 0.0–0.1)
Basophils Relative: 1 %
EOS PCT: 3 %
Eosinophils Absolute: 0.1 10*3/uL (ref 0.0–0.7)
HEMATOCRIT: 34.3 % — AB (ref 39.0–52.0)
Hemoglobin: 11.5 g/dL — ABNORMAL LOW (ref 13.0–17.0)
LYMPHS PCT: 20 %
Lymphs Abs: 0.9 10*3/uL (ref 0.7–4.0)
MCH: 30.2 pg (ref 26.0–34.0)
MCHC: 33.5 g/dL (ref 30.0–36.0)
MCV: 90 fL (ref 78.0–100.0)
MONO ABS: 0.6 10*3/uL (ref 0.1–1.0)
Monocytes Relative: 13 %
NEUTROS ABS: 2.7 10*3/uL (ref 1.7–7.7)
Neutrophils Relative %: 64 %
PLATELETS: 110 10*3/uL — AB (ref 150–400)
RBC: 3.81 MIL/uL — AB (ref 4.22–5.81)
RDW: 14.3 % (ref 11.5–15.5)
WBC: 4.3 10*3/uL (ref 4.0–10.5)

## 2015-02-11 LAB — BASIC METABOLIC PANEL
Anion gap: 5 (ref 5–15)
BUN: 16 mg/dL (ref 6–20)
CO2: 27 mmol/L (ref 22–32)
Calcium: 8.6 mg/dL — ABNORMAL LOW (ref 8.9–10.3)
Chloride: 104 mmol/L (ref 101–111)
Creatinine, Ser: 1.42 mg/dL — ABNORMAL HIGH (ref 0.61–1.24)
GFR calc Af Amer: 50 mL/min — ABNORMAL LOW (ref >60)
GFR, EST NON AFRICAN AMERICAN: 43 mL/min — AB (ref >60)
GLUCOSE: 104 mg/dL — AB (ref 65–99)
POTASSIUM: 4.1 mmol/L (ref 3.5–5.1)
Sodium: 136 mmol/L (ref 135–145)

## 2015-02-11 NOTE — ED Notes (Signed)
Pt's wife states pt was here Monday and was diagnosed with pneumonia. States they were called yesterday and told they found blood in a culture and told to come back here because he needed his medication changed

## 2015-02-11 NOTE — ED Notes (Signed)
Given Orange Juice per request.

## 2015-02-11 NOTE — ED Provider Notes (Signed)
CSN: [id number]     Arrival date & time 02/11/15  0915 History   First MD Initiated Contact with Patient 02/11/15 0935     Chief Complaint  Patient presents with  . Fatigue     (Consider location/radiation/quality/duration/timing/severity/associated sxs/prior Treatment) The history is provided by the patient and the spouse.   Jorge Nixon is a 81 y.o. male with a history significant for HTN, CAD, CVA and diagnosis of pneumonia when seen here 2 days ago and placed on levaquin and discharged home given lack of sob, chest pain or worrisome constitutional symptoms.  One of his blood cultures resulted today with gram variable rods and was advised to return here.  He has been compliant with his levaquin. He reports fatigue today, but not worsened since prior visit and denies fevers at home, denies sob or chest pain.  He has had no nausea or vomiting, but does endorse pain in his right lower abdomen since he was seen here Monday.  He reports good appetite.  This pain is worsened with walking and palpation and better at rest. He has had occasional cough, not more than baseline per wife.     Past Medical History  Diagnosis Date  . Hypertension   . Diverticular disease 02/14/2011    problems swallowing  . GERD (gastroesophageal reflux disease)   . Coronary artery disease   . Kidney stone   . Hypothyroidism   . Anemia   . COPD (chronic obstructive pulmonary disease)   . Non-ischemic cardiomyopathy     mild  EF on echo 05/03/2012 55 to 60%  . Nephrolithiasis     lithotripsy 2006  . BPH (benign prostatic hyperplasia)   . TIA (transient ischemic attack)   . Shortness of breath     exertion  . Headache(784.0)   . Dizziness   . Arthritis   . History of blood transfusion   . Pneumonia     hx of - hospitalized  . Carotid artery occlusion   . Stroke 03-11-13  . DVT (deep venous thrombosis)    Past Surgical History  Procedure Laterality Date  . Total hip arthroplasty Right august 6th 2012     right side   . Replacement total knee Left 2006    left knee  . Transurethral resection of prostate  1996  . Knee arthroscopy Right 2010  . Joint replacement Right     hip  . Joint replacement Left     knee  . Carpel tunnel Right   . Endarterectomy Left 03/11/2013    Procedure: ENDARTERECTOMY CAROTID with Vascu-Guard Bovine Patch.;  Surgeon: Conrad Chireno, MD;  Location: [place];  Service: Vascular;  Laterality: Left;  . Carotid endarterectomy Left 03-11-13    cea   Family History  Problem Relation Age of Onset  . Stroke Father   . Heart attack Father   . Hypertension    . Diabetes    . Hypertension Mother    Social History  Substance Use Topics  . Smoking status: Never Smoker   . Smokeless tobacco: Never Used  . Alcohol Use: No    Review of Systems  Constitutional: Negative for fever.  HENT: Negative for congestion and sore throat.   Eyes: Negative.   Respiratory: Positive for cough. Negative for chest tightness and shortness of breath.   Cardiovascular: Negative for chest pain.  Gastrointestinal: Positive for abdominal pain. Negative for nausea and vomiting.  Genitourinary: Negative.  Negative for dysuria and decreased urine volume.  Musculoskeletal: Negative for joint swelling, arthralgias and neck pain.  Skin: Negative.  Negative for rash and wound.  Neurological: Negative for dizziness, weakness, light-headedness, numbness and headaches.  Psychiatric/Behavioral: Negative.       Allergies  Sulfa antibiotics and Milk-related compounds  Home Medications   Prior to Admission medications   Medication Sig Start Date End Date Taking? Authorizing Provider  acetaminophen (TYLENOL) 500 MG tablet Take 1,000 mg by mouth every 6 (six) hours as needed for pain.    Yes Historical Provider, MD  aspirin EC 81 MG tablet Take 81 mg by mouth every morning.   Yes Historical Provider, MD  Bisacodyl (LAXATIVE PO) Take 8.6 mg by mouth at bedtime as needed and may repeat dose one  time if needed.   Yes Historical Provider, MD  Digestive Enzymes (DIGESTIVE ENZYME PO) Take 2 each by mouth at bedtime. Advantage Gummies Take 2 daily   Yes Historical Provider, MD  docusate sodium (COLACE) 250 MG capsule Take 250 mg by mouth at bedtime as needed and may repeat dose one time if needed for constipation.   Yes Historical Provider, MD  fish oil-omega-3 fatty acids 1000 MG capsule Take 300 mg by mouth every morning.    Yes Historical Provider, MD  levofloxacin (LEVAQUIN) 500 MG tablet Take 1 tablet (500 mg total) by mouth daily. 02/09/15  Yes Jola Schmidt, MD  levothyroxine (SYNTHROID, LEVOTHROID) 50 MCG tablet Take 75 mcg by mouth daily before breakfast.    Yes Historical Provider, MD  losartan-hydrochlorothiazide (HYZAAR) 50-12.5 MG per tablet Take 0.5 tablets by mouth every morning.   Yes Historical Provider, MD  Nutritional Supplements (ENSURE COMPLETE PO) Take 237 mLs by mouth. Take every other day   Yes Historical Provider, MD  Psyllium 28.3 % POWD Take 1 scoop by mouth daily. Two tsp daily   Yes Historical Provider, MD   BP 156/84 mmHg  Pulse 63  Temp(Src) 97.9 F (36.6 C) (Oral)  Resp 14  Ht 5\' 11"  (1.803 m)  Wt 147 lb (66.679 kg)  BMI 20.51 kg/m2  SpO2 97% Physical Exam  Constitutional: He appears well-developed and well-nourished.  HENT:  Head: Normocephalic and atraumatic.  Eyes: Conjunctivae are normal.  Neck: Normal range of motion.  Cardiovascular: Normal rate, regular rhythm, normal heart sounds and intact distal pulses.   Pulmonary/Chest: Effort normal and breath sounds normal. He has no wheezes.  Abdominal: Soft. Bowel sounds are normal. There is no hepatosplenomegaly. There is tenderness in the right lower quadrant. There is tenderness at McBurney's point. There is no rebound.  Musculoskeletal: Normal range of motion.  Neurological: He is alert.  Skin: Skin is warm and dry.  Psychiatric: He has a normal mood and affect.  Nursing note and vitals  reviewed.   ED Course  Procedures (including critical care time) Labs Review Labs Reviewed  CBC WITH DIFFERENTIAL/PLATELET - Abnormal; Notable for the following:    RBC 3.81 (*)    Hemoglobin 11.5 (*)    HCT 34.3 (*)    Platelets 110 (*)    All other components within normal limits  BASIC METABOLIC PANEL - Abnormal; Notable for the following:    Glucose, Bld 104 (*)    Creatinine, Ser 1.42 (*)    Calcium 8.6 (*)    GFR calc non Af Amer 43 (*)    GFR calc Af Amer 50 (*)    All other components within normal limits    Imaging Review Dg Chest 2 View  02/11/2015  CLINICAL DATA:  Pneumonia  EXAM: CHEST  2 VIEW  COMPARISON:  Two days ago  FINDINGS: Normal heart size and stable aortic contours.  Normalized appearance of the right basilar lung. Hyperinflation in this patient with history of COPD. Stable biapical pleural scarring.  IMPRESSION: 1. Interval clearing of lungs with no pneumonia seen today. Given rapid change, previous opacity may have reflected atelectasis. 2. COPD.   Electronically Signed   By: Monte Fantasia M.D.   On: 02/11/2015 11:22   I have personally reviewed and evaluated these images and lab results as part of my medical decision-making.   EKG Interpretation None      MDM   Final diagnoses:  CAP (community acquired pneumonia)    Labs and imaging from today and from Mondays visit reviewed, including abdominal and pelvis CT scan which was negative for any abdominal pathology including a normal appendix.  Discussed care and possible admission with Dr. Jerilee Hoh with [facility].  She spoke with Dr. Megan Salon of infectious disease given the inconclusive blood cultures.  Recommends pt can go home with close f/u of cultures if clinically stable which he is today.  He will continue with his levaquin. Advised f/u with pcp in 2 days, but return here if he develops return of fever, increased fatigue or shortness of breath, chest pain or any new symptoms.  Patient's  PCP was also notified via Epic of patient's course and need for close follow-up.  Pt was seen by Dr Roderic Palau prior to dc home.    Evalee Jefferson, PA-C 02/11/15 1244  Evalee Jefferson, PA-C 02/11/15 1244  Milton Ferguson, MD 02/11/15 1515

## 2015-02-11 NOTE — ED Notes (Signed)
Jorge Nixon states RN from [facility] called in reference to Gram Variant Rod Growth in Blood Culture obtained Monday. Patient c/o continued weakness and shortness of breath. Taking antibiotic as prescribed. Alert/oriented x 4.

## 2015-02-11 NOTE — ED Notes (Signed)
Patient with no complaints at this time. Respirations even and unlabored. Skin warm/dry. Discharge instructions reviewed with patient at this time. Patient given opportunity to voice concerns/ask questions. Patient discharged at this time and left Emergency Department with steady gait.   

## 2015-02-11 NOTE — Discharge Instructions (Signed)
Pneumonia Pneumonia is an infection of the lungs.  CAUSES Pneumonia may be caused by bacteria or a virus. Usually, these infections are caused by breathing infectious particles into the lungs (respiratory tract). SIGNS AND SYMPTOMS   Cough.  Fever.  Chest pain.  Increased rate of breathing.  Wheezing.  Mucus production. DIAGNOSIS  If you have the common symptoms of pneumonia, your health care provider will typically confirm the diagnosis with a chest X-ray. The X-ray will show an abnormality in the lung (pulmonary infiltrate) if you have pneumonia. Other tests of your blood, urine, or sputum may be done to find the specific cause of your pneumonia. Your health care provider may also do tests (blood gases or pulse oximetry) to see how well your lungs are working. TREATMENT  Some forms of pneumonia may be spread to other people when you cough or sneeze. You may be asked to wear a mask before and during your exam. Pneumonia that is caused by bacteria is treated with antibiotic medicine. Pneumonia that is caused by the influenza virus may be treated with an antiviral medicine. Most other viral infections must run their course. These infections will not respond to antibiotics.  HOME CARE INSTRUCTIONS   Cough suppressants may be used if you are losing too much rest. However, coughing protects you by clearing your lungs. You should avoid using cough suppressants if you can.  Your health care provider may have prescribed medicine if he or she thinks your pneumonia is caused by bacteria or influenza. Finish your medicine even if you start to feel better.  Your health care provider may also prescribe an expectorant. This loosens the mucus to be coughed up.  Take medicines only as directed by your health care provider.  Do not smoke. Smoking is a common cause of bronchitis and can contribute to pneumonia. If you are a smoker and continue to smoke, your cough may last several weeks after your  pneumonia has cleared.  A cold steam vaporizer or humidifier in your room or home may help loosen mucus.  Coughing is often worse at night. Sleeping in a semi-upright position in a recliner or using a couple pillows under your head will help with this.  Get rest as you feel it is needed. Your body will usually let you know when you need to rest. PREVENTION A pneumococcal shot (vaccine) is available to prevent a common bacterial cause of pneumonia. This is usually suggested for:  People over 65 years old.  Patients on chemotherapy.  People with chronic lung problems, such as bronchitis or emphysema.  People with immune system problems. If you are over 65 or have a high risk condition, you may receive the pneumococcal vaccine if you have not received it before. In some countries, a routine influenza vaccine is also recommended. This vaccine can help prevent some cases of pneumonia.You may be offered the influenza vaccine as part of your care. If you smoke, it is time to quit. You may receive instructions on how to stop smoking. Your health care provider can provide medicines and counseling to help you quit. SEEK MEDICAL CARE IF: You have a fever. SEEK IMMEDIATE MEDICAL CARE IF:   Your illness becomes worse. This is especially true if you are elderly or weakened from any other disease.  You cannot control your cough with suppressants and are losing sleep.  You begin coughing up blood.  You develop pain which is getting worse or is uncontrolled with medicines.  Any of the symptoms   which initially brought you in for treatment are getting worse rather than better.  You develop shortness of breath or chest pain. MAKE SURE YOU:   Understand these instructions.  Will watch your condition.  Will get help right away if you are not doing well or get worse. Document Released: 05/16/2005 Document Revised: 09/30/2013 Document Reviewed: 08/05/2010 [facility] Patient Information 2015  [place], [place]. This information is not intended to replace advice given to you by your health care provider. Make sure you discuss any questions you have with your health care provider.   Continue taking your antibiotic as you were prescribed on Monday.  Call Dr. Nevada Crane for a recheck visit in 2 days.  Return here or call him sooner if you develop any worsened symptoms such as shortness of breath, return of fever or increased fatigue.

## 2015-02-11 NOTE — Telephone Encounter (Signed)
Positive blood cultures.  Spoke with pts wife. Pt is not feeling well today, weak and sob.  Advised to return to ED for further evaluation. Wife states that they will come back.

## 2015-02-13 DIAGNOSIS — J189 Pneumonia, unspecified organism: Secondary | ICD-10-CM | POA: Diagnosis not present

## 2015-02-13 DIAGNOSIS — R1030 Lower abdominal pain, unspecified: Secondary | ICD-10-CM | POA: Diagnosis not present

## 2015-02-14 LAB — CULTURE, BLOOD (ROUTINE X 2): Culture: NO GROWTH

## 2015-02-16 ENCOUNTER — Encounter (HOSPITAL_COMMUNITY): Payer: Self-pay

## 2015-02-16 ENCOUNTER — Telehealth (HOSPITAL_COMMUNITY): Payer: Self-pay

## 2015-02-16 ENCOUNTER — Emergency Department (HOSPITAL_COMMUNITY)
Admission: EM | Admit: 2015-02-16 | Discharge: 2015-02-16 | Disposition: A | Discharge status: Home / Self Care | Payer: Medicare Other | Attending: Emergency Medicine | Admitting: Emergency Medicine

## 2015-02-16 DIAGNOSIS — Z862 Personal history of diseases of the blood and blood-forming organs and certain disorders involving the immune mechanism: Secondary | ICD-10-CM | POA: Insufficient documentation

## 2015-02-16 DIAGNOSIS — Z79899 Other long term (current) drug therapy: Secondary | ICD-10-CM | POA: Diagnosis not present

## 2015-02-16 DIAGNOSIS — Z8673 Personal history of transient ischemic attack (TIA), and cerebral infarction without residual deficits: Secondary | ICD-10-CM | POA: Insufficient documentation

## 2015-02-16 DIAGNOSIS — Z0189 Encounter for other specified special examinations: Secondary | ICD-10-CM

## 2015-02-16 DIAGNOSIS — Z7982 Long term (current) use of aspirin: Secondary | ICD-10-CM | POA: Insufficient documentation

## 2015-02-16 DIAGNOSIS — Z792 Long term (current) use of antibiotics: Secondary | ICD-10-CM | POA: Insufficient documentation

## 2015-02-16 DIAGNOSIS — I10 Essential (primary) hypertension: Secondary | ICD-10-CM | POA: Diagnosis not present

## 2015-02-16 DIAGNOSIS — I2510 Atherosclerotic heart disease of native coronary artery without angina pectoris: Secondary | ICD-10-CM | POA: Diagnosis not present

## 2015-02-16 DIAGNOSIS — Z87442 Personal history of urinary calculi: Secondary | ICD-10-CM | POA: Insufficient documentation

## 2015-02-16 DIAGNOSIS — Z8719 Personal history of other diseases of the digestive system: Secondary | ICD-10-CM | POA: Insufficient documentation

## 2015-02-16 DIAGNOSIS — E039 Hypothyroidism, unspecified: Secondary | ICD-10-CM | POA: Insufficient documentation

## 2015-02-16 DIAGNOSIS — Z86718 Personal history of other venous thrombosis and embolism: Secondary | ICD-10-CM | POA: Diagnosis not present

## 2015-02-16 DIAGNOSIS — Z0000 Encounter for general adult medical examination without abnormal findings: Secondary | ICD-10-CM | POA: Diagnosis not present

## 2015-02-16 DIAGNOSIS — Z87448 Personal history of other diseases of urinary system: Secondary | ICD-10-CM | POA: Diagnosis not present

## 2015-02-16 DIAGNOSIS — Z8701 Personal history of pneumonia (recurrent): Secondary | ICD-10-CM | POA: Insufficient documentation

## 2015-02-16 DIAGNOSIS — J449 Chronic obstructive pulmonary disease, unspecified: Secondary | ICD-10-CM | POA: Diagnosis not present

## 2015-02-16 DIAGNOSIS — M1990 Unspecified osteoarthritis, unspecified site: Secondary | ICD-10-CM | POA: Diagnosis not present

## 2015-02-16 LAB — CBC WITH DIFFERENTIAL/PLATELET
BASOS ABS: 0 10*3/uL (ref 0.0–0.1)
BASOS PCT: 1 %
EOS PCT: 1 %
Eosinophils Absolute: 0.1 10*3/uL (ref 0.0–0.7)
HCT: 35.4 % — ABNORMAL LOW (ref 39.0–52.0)
Hemoglobin: 11.6 g/dL — ABNORMAL LOW (ref 13.0–17.0)
LYMPHS PCT: 22 %
Lymphs Abs: 1.3 10*3/uL (ref 0.7–4.0)
MCH: 29.1 pg (ref 26.0–34.0)
MCHC: 32.8 g/dL (ref 30.0–36.0)
MCV: 88.7 fL (ref 78.0–100.0)
MONO ABS: 0.6 10*3/uL (ref 0.1–1.0)
MONOS PCT: 10 %
Neutro Abs: 3.8 10*3/uL (ref 1.7–7.7)
Neutrophils Relative %: 66 %
PLATELETS: 162 10*3/uL (ref 150–400)
RBC: 3.99 MIL/uL — ABNORMAL LOW (ref 4.22–5.81)
RDW: 14 % (ref 11.5–15.5)
WBC: 5.7 10*3/uL (ref 4.0–10.5)

## 2015-02-16 LAB — BASIC METABOLIC PANEL
ANION GAP: 6 (ref 5–15)
BUN: 21 mg/dL — ABNORMAL HIGH (ref 6–20)
CALCIUM: 8.6 mg/dL — AB (ref 8.9–10.3)
CO2: 27 mmol/L (ref 22–32)
CREATININE: 1.3 mg/dL — AB (ref 0.61–1.24)
Chloride: 103 mmol/L (ref 101–111)
GFR calc Af Amer: 55 mL/min — ABNORMAL LOW (ref >60)
GFR, EST NON AFRICAN AMERICAN: 47 mL/min — AB (ref >60)
GLUCOSE: 100 mg/dL — AB (ref 65–99)
Potassium: 4.2 mmol/L (ref 3.5–5.1)
Sodium: 136 mmol/L (ref 135–145)

## 2015-02-16 LAB — I-STAT CG4 LACTIC ACID, ED: Lactic Acid, Venous: 1.29 mmol/L (ref 0.5–2.0)

## 2015-02-16 MED ORDER — SODIUM CHLORIDE 0.9 % IV SOLN
INTRAVENOUS | Status: DC
  Administered 2015-02-16: 15:00:00 via INTRAVENOUS

## 2015-02-16 NOTE — Discharge Instructions (Signed)
Return as needed to the ER with recurrence of fever or any additional symptoms.

## 2015-02-16 NOTE — Telephone Encounter (Signed)
Post ED Visit - Positive Culture Follow-up: Chart Hand-off to ED Flow Manager  Culture assessed and recommendations reviewed by: []  Levester Fresh, Pharm.D., BCPS [x]  Heide Guile, Pharm.D., BCPS-AQ ID []  Alycia Rossetti, Pharm.D., BCPS []  [place], Pharm.D., BCPS, AAHIVP []  Legrand Como, Pharm .D., BCPS, AAHIVP []  Milus Glazier, Pharm.D. []  Dimitri Ped, Pharm.D.  Positive blood  culture  []  Patient discharged without antimicrobial prescription and treatment is now indicated [x]  Organism is resistant to prescribed ED discharge antimicrobial []  Patient with positive blood cultures  Changes discussed with ED provider: Delos Haring PA New antibiotic prescription needs to return for IV antibiotics  Spoke with pt and wife. States they will speak with Dr Nevada Crane (PCP) and then return if he agrees   Ileene Musa 02/16/2015, 12:34 PM

## 2015-02-16 NOTE — ED Notes (Signed)
Pt reports got a call from the lab and was told he has bacteria in his blood and needed to come to er for IV antibiotics.

## 2015-02-17 NOTE — ED Provider Notes (Signed)
CSN: [id number]     Arrival date & time 02/16/15  1420 History   First MD Initiated Contact with Patient 02/16/15 1457     Chief Complaint  Patient presents with  . abnormal labwork       HPI  Patient presents for evaluation after being called at home for a positive blood culture. Seen and evaluated on the 12th 1 week ago. X-ray findings of the right lower lobe pneumonia. Had had fevers at home. Had blood cultures obtained. They were called and returned here on the 14th for a positive blood culture showing gram-negative rods. He was feeling well. Had been on Levaquin. Discharge back to home. Apparently they received another phone call today from [facility] instructed and return here for the positive blood culture. He feels well. He has no symptoms. On the 12th he had a CT scan of his abdomen and repeat chest x-ray. His apparent pneumonia had resolved. In the interval the blood cultures in one of 2 bottles have shown Escherichia coli that is sensitive to all but Levaquin interestingly as the patient has been on Levaquin but has improved.  Past Medical History  Diagnosis Date  . Hypertension   . Diverticular disease 02/14/2011    problems swallowing  . GERD (gastroesophageal reflux disease)   . Coronary artery disease   . Kidney stone   . Hypothyroidism   . Anemia   . COPD (chronic obstructive pulmonary disease)   . Non-ischemic cardiomyopathy     mild  EF on echo 05/03/2012 55 to 60%  . Nephrolithiasis     lithotripsy 2006  . BPH (benign prostatic hyperplasia)   . TIA (transient ischemic attack)   . Shortness of breath     exertion  . Headache(784.0)   . Dizziness   . Arthritis   . History of blood transfusion   . Pneumonia     hx of - hospitalized  . Carotid artery occlusion   . Stroke 03-11-13  . DVT (deep venous thrombosis)    Past Surgical History  Procedure Laterality Date  . Total hip arthroplasty Right august 6th 2012    right side   . Replacement total knee Left  2006    left knee  . Transurethral resection of prostate  1996  . Knee arthroscopy Right 2010  . Joint replacement Right     hip  . Joint replacement Left     knee  . Carpel tunnel Right   . Endarterectomy Left 03/11/2013    Procedure: ENDARTERECTOMY CAROTID with Vascu-Guard Bovine Patch.;  Surgeon: Conrad Pound, MD;  Location: [place];  Service: Vascular;  Laterality: Left;  . Carotid endarterectomy Left 03-11-13    cea   Family History  Problem Relation Age of Onset  . Stroke Father   . Heart attack Father   . Hypertension    . Diabetes    . Hypertension Mother    Social History  Substance Use Topics  . Smoking status: Never Smoker   . Smokeless tobacco: Never Used  . Alcohol Use: No    Review of Systems  Constitutional: Negative for fever, chills, diaphoresis, appetite change and fatigue.  HENT: Negative for mouth sores, sore throat and trouble swallowing.   Eyes: Negative for visual disturbance.  Respiratory: Negative for cough, chest tightness, shortness of breath and wheezing.   Cardiovascular: Negative for chest pain.  Gastrointestinal: Negative for nausea, vomiting, abdominal pain, diarrhea and abdominal distention.  Endocrine: Negative for polydipsia, polyphagia and polyuria.  Genitourinary: Negative for dysuria, frequency and hematuria.  Musculoskeletal: Negative for gait problem.  Skin: Negative for color change, pallor and rash.  Neurological: Negative for dizziness, syncope, light-headedness and headaches.  Hematological: Does not bruise/bleed easily.  Psychiatric/Behavioral: Negative for behavioral problems and confusion.      Allergies  Sulfa antibiotics and Milk-related compounds  Home Medications   Prior to Admission medications   Medication Sig Start Date End Date Taking? Authorizing Provider  acetaminophen (TYLENOL) 500 MG tablet Take 1,000 mg by mouth every 6 (six) hours as needed for pain.    Yes Historical Provider, MD  aspirin EC 81 MG  tablet Take 81 mg by mouth every morning.   Yes Historical Provider, MD  Digestive Enzymes (DIGESTIVE ENZYME PO) Take 2 each by mouth at bedtime. Advantage Gummies Take 2 daily   Yes Historical Provider, MD  docusate sodium (COLACE) 250 MG capsule Take 250 mg by mouth at bedtime as needed and may repeat dose one time if needed for constipation.   Yes Historical Provider, MD  fish oil-omega-3 fatty acids 1000 MG capsule Take 300 mg by mouth every morning.    Yes Historical Provider, MD  levofloxacin (LEVAQUIN) 500 MG tablet Take 1 tablet (500 mg total) by mouth daily. 02/09/15  Yes Jola Schmidt, MD  levothyroxine (SYNTHROID, LEVOTHROID) 50 MCG tablet Take 75 mcg by mouth daily before breakfast.    Yes Historical Provider, MD  losartan-hydrochlorothiazide (HYZAAR) 50-12.5 MG per tablet Take 0.5 tablets by mouth every morning.   Yes Historical Provider, MD  Nutritional Supplements (ENSURE COMPLETE PO) Take 237 mLs by mouth. Take every other day   Yes Historical Provider, MD  Psyllium 28.3 % POWD Take 1 scoop by mouth daily. Two tsp daily   Yes Historical Provider, MD   BP 166/90 mmHg  Pulse 70  Temp(Src) 97.8 F (36.6 C) (Oral)  Resp 20  Ht 5\' 11"  (1.803 m)  Wt 148 lb (67.132 kg)  BMI 20.65 kg/m2  SpO2 98% Physical Exam  Constitutional: He is oriented to person, place, and time. He appears well-developed and well-nourished. No distress.  HENT:  Head: Normocephalic.  Eyes: Conjunctivae are normal. Pupils are equal, round, and reactive to light. No scleral icterus.  Neck: Normal range of motion. Neck supple. No thyromegaly present.  Cardiovascular: Normal rate and regular rhythm.  Exam reveals no gallop and no friction rub.   No murmur heard. Pulmonary/Chest: Effort normal and breath sounds normal. No respiratory distress. He has no wheezes. He has no rales.  Abdominal: Soft. Bowel sounds are normal. He exhibits no distension. There is no tenderness. There is no rebound.  Musculoskeletal:  Normal range of motion.  Neurological: He is alert and oriented to person, place, and time.  Skin: Skin is warm and dry. No rash noted.  Psychiatric: He has a normal mood and affect. His behavior is normal.    ED Course  Procedures (including critical care time) Labs Review Labs Reviewed  CBC WITH DIFFERENTIAL/PLATELET - Abnormal; Notable for the following:    RBC 3.99 (*)    Hemoglobin 11.6 (*)    HCT 35.4 (*)    All other components within normal limits  BASIC METABOLIC PANEL - Abnormal; Notable for the following:    Glucose, Bld 100 (*)    BUN 21 (*)    Creatinine, Ser 1.30 (*)    Calcium 8.6 (*)    GFR calc non Af Amer 47 (*)    GFR calc Af Amer 55 (*)  All other components within normal limits  CULTURE, BLOOD (ROUTINE X 2)  CULTURE, BLOOD (ROUTINE X 2)  URINE CULTURE  I-STAT CG4 LACTIC ACID, ED    Imaging Review No results found. I have personally reviewed and evaluated these images and lab results as part of my medical decision-making.   EKG Interpretation None      MDM   Final diagnoses:  Laboratory examination    Patient is family are quite upset because "this is the third time we have been here". I discussed the case in its entirety with infectious disease. They felt that no additional treatment was needed. Additional blood cultures were drawn and obtained today and are currently pending. This was discussed with family at length and they were discharged.    Tanna Furry, MD 02/17/15 2230

## 2015-02-21 LAB — CULTURE, BLOOD (ROUTINE X 2)
CULTURE: NO GROWTH
CULTURE: NO GROWTH

## 2015-02-27 DIAGNOSIS — R7881 Bacteremia: Secondary | ICD-10-CM | POA: Diagnosis not present

## 2015-04-27 ENCOUNTER — Emergency Department (HOSPITAL_COMMUNITY): Payer: Medicare Other

## 2015-04-27 ENCOUNTER — Emergency Department (HOSPITAL_COMMUNITY)
Admission: EM | Admit: 2015-04-27 | Discharge: 2015-04-27 | Disposition: A | Discharge status: Home / Self Care | Payer: Medicare Other | Attending: Emergency Medicine | Admitting: Emergency Medicine

## 2015-04-27 ENCOUNTER — Encounter (HOSPITAL_COMMUNITY): Payer: Self-pay | Admitting: Emergency Medicine

## 2015-04-27 DIAGNOSIS — Z862 Personal history of diseases of the blood and blood-forming organs and certain disorders involving the immune mechanism: Secondary | ICD-10-CM | POA: Diagnosis not present

## 2015-04-27 DIAGNOSIS — J449 Chronic obstructive pulmonary disease, unspecified: Secondary | ICD-10-CM | POA: Diagnosis not present

## 2015-04-27 DIAGNOSIS — R093 Abnormal sputum: Secondary | ICD-10-CM | POA: Diagnosis not present

## 2015-04-27 DIAGNOSIS — Z7982 Long term (current) use of aspirin: Secondary | ICD-10-CM | POA: Insufficient documentation

## 2015-04-27 DIAGNOSIS — Z79899 Other long term (current) drug therapy: Secondary | ICD-10-CM | POA: Diagnosis not present

## 2015-04-27 DIAGNOSIS — Z87430 Personal history of prostatic dysplasia: Secondary | ICD-10-CM | POA: Diagnosis not present

## 2015-04-27 DIAGNOSIS — Z8701 Personal history of pneumonia (recurrent): Secondary | ICD-10-CM | POA: Diagnosis not present

## 2015-04-27 DIAGNOSIS — Z792 Long term (current) use of antibiotics: Secondary | ICD-10-CM | POA: Diagnosis not present

## 2015-04-27 DIAGNOSIS — M1990 Unspecified osteoarthritis, unspecified site: Secondary | ICD-10-CM | POA: Insufficient documentation

## 2015-04-27 DIAGNOSIS — Z8673 Personal history of transient ischemic attack (TIA), and cerebral infarction without residual deficits: Secondary | ICD-10-CM | POA: Diagnosis not present

## 2015-04-27 DIAGNOSIS — Z87442 Personal history of urinary calculi: Secondary | ICD-10-CM | POA: Diagnosis not present

## 2015-04-27 DIAGNOSIS — J069 Acute upper respiratory infection, unspecified: Secondary | ICD-10-CM | POA: Diagnosis not present

## 2015-04-27 DIAGNOSIS — R05 Cough: Secondary | ICD-10-CM | POA: Diagnosis not present

## 2015-04-27 DIAGNOSIS — Z86718 Personal history of other venous thrombosis and embolism: Secondary | ICD-10-CM | POA: Diagnosis not present

## 2015-04-27 DIAGNOSIS — E039 Hypothyroidism, unspecified: Secondary | ICD-10-CM | POA: Diagnosis not present

## 2015-04-27 DIAGNOSIS — I10 Essential (primary) hypertension: Secondary | ICD-10-CM | POA: Diagnosis not present

## 2015-04-27 DIAGNOSIS — Z8719 Personal history of other diseases of the digestive system: Secondary | ICD-10-CM | POA: Insufficient documentation

## 2015-04-27 DIAGNOSIS — I2510 Atherosclerotic heart disease of native coronary artery without angina pectoris: Secondary | ICD-10-CM | POA: Diagnosis not present

## 2015-04-27 DIAGNOSIS — R0602 Shortness of breath: Secondary | ICD-10-CM | POA: Diagnosis not present

## 2015-04-27 MED ORDER — GUAIFENESIN-DM 100-10 MG/5ML PO SYRP: 5.0000 mL | ORAL_SOLUTION | ORAL | Status: DC | PRN

## 2015-04-27 NOTE — Discharge Instructions (Signed)
Upper Respiratory Infection, Adult Most upper respiratory infections (URIs) are a viral infection of the air passages leading to the lungs. A URI affects the nose, throat, and upper air passages. The most common type of URI is nasopharyngitis and is typically referred to as "the common cold." URIs run their course and usually go away on their own. Most of the time, a URI does not require medical attention, but sometimes a bacterial infection in the upper airways can follow a viral infection. This is called a secondary infection. Sinus and middle ear infections are common types of secondary upper respiratory infections. Bacterial pneumonia can also complicate a URI. A URI can worsen asthma and chronic obstructive pulmonary disease (COPD). Sometimes, these complications can require emergency medical care and may be life threatening.  CAUSES Almost all URIs are caused by viruses. A virus is a type of germ and can spread from one person to another.  RISKS FACTORS You may be at risk for a URI if:   You smoke.   You have chronic heart or lung disease.  You have a weakened defense (immune) system.   You are very young or very old.   You have nasal allergies or asthma.  You work in crowded or poorly ventilated areas.  You work in health care facilities or schools. SIGNS AND SYMPTOMS  Symptoms typically develop 2-3 days after you come in contact with a cold virus. Most viral URIs last 7-10 days. However, viral URIs from the influenza virus (flu virus) can last 14-18 days and are typically more severe. Symptoms may include:   Runny or stuffy (congested) nose.   Sneezing.   Cough.   Sore throat.   Headache.   Fatigue.   Fever.   Loss of appetite.   Pain in your forehead, behind your eyes, and over your cheekbones (sinus pain).  Muscle aches.  DIAGNOSIS  Your health care provider may diagnose a URI by:  Physical exam.  Tests to check that your symptoms are not due to  another condition such as:  Strep throat.  Sinusitis.  Pneumonia.  Asthma. TREATMENT  A URI goes away on its own with time. It cannot be cured with medicines, but medicines may be prescribed or recommended to relieve symptoms. Medicines may help:  Reduce your fever.  Reduce your cough.  Relieve nasal congestion. HOME CARE INSTRUCTIONS   Take medicines only as directed by your health care provider.   Gargle warm saltwater or take cough drops to comfort your throat as directed by your health care provider.  Use a warm mist humidifier or inhale steam from a shower to increase air moisture. This may make it easier to breathe.  Drink enough fluid to keep your urine clear or pale yellow.   Eat soups and other clear broths and maintain good nutrition.   Rest as needed.   Return to work when your temperature has returned to normal or as your health care provider advises. You may need to stay home longer to avoid infecting others. You can also use a face mask and careful hand washing to prevent spread of the virus.  Increase the usage of your inhaler if you have asthma.   Do not use any tobacco products, including cigarettes, chewing tobacco, or electronic cigarettes. If you need help quitting, ask your health care provider. PREVENTION  The best way to protect yourself from getting a cold is to practice good hygiene.   Avoid oral or hand contact with people with cold   symptoms.   Wash your hands often if contact occurs.  There is no clear evidence that vitamin C, vitamin E, echinacea, or exercise reduces the chance of developing a cold. However, it is always recommended to get plenty of rest, exercise, and practice good nutrition.  SEEK MEDICAL CARE IF:   You are getting worse rather than better.   Your symptoms are not controlled by medicine.   You have chills.  You have worsening shortness of breath.  You have brown or red mucus.  You have yellow or brown nasal  discharge.  You have pain in your face, especially when you bend forward.  You have a fever.  You have swollen neck glands.  You have pain while swallowing.  You have white areas in the back of your throat. SEEK IMMEDIATE MEDICAL CARE IF:   You have severe or persistent:  Headache.  Ear pain.  Sinus pain.  Chest pain.  You have chronic lung disease and any of the following:  Wheezing.  Prolonged cough.  Coughing up blood.  A change in your usual mucus.  You have a stiff neck.  You have changes in your:  Vision.  Hearing.  Thinking.  Mood. MAKE SURE YOU:   Understand these instructions.  Will watch your condition.  Will get help right away if you are not doing well or get worse.   This information is not intended to replace advice given to you by your health care provider. Make sure you discuss any questions you have with your health care provider.   Document Released: 11/09/2000 Document Revised: 09/30/2014 Document Reviewed: 08/21/2013 Elsevier Interactive Patient Education 2016 [employer]  

## 2015-04-27 NOTE — ED Provider Notes (Signed)
CSN: [id number]     Arrival date & time 04/27/15  [id number] History   First MD Initiated Contact with Patient 04/27/15 [phone]     Chief Complaint  Patient presents with  . Cough     (Consider location/radiation/quality/duration/timing/severity/associated sxs/prior Treatment) Patient is a 81 y.o. male presenting with cough. The history is provided by the patient.  Cough Cough characteristics:  Productive Associated symptoms: shortness of breath   Associated symptoms: no chest pain    patient had a cough for the last 3 days. Mostly dry but has had some white sputum production. No fevers. States he's felt little more fatigue. He is worried because around 5 weeks ago he had a pneumonia and was admitted to the hospital. No abdominal pain. No fevers. States he's had a good appetite. He states he was at Thanksgiving and a family member of his did have a cough.  Past Medical History  Diagnosis Date  . Hypertension   . Diverticular disease 02/14/2011    problems swallowing  . GERD (gastroesophageal reflux disease)   . Coronary artery disease   . Kidney stone   . Hypothyroidism   . Anemia   . COPD (chronic obstructive pulmonary disease) ([place])   . Non-ischemic cardiomyopathy ([place])     mild  EF on echo 05/03/2012 55 to 60%  . Nephrolithiasis     lithotripsy 2006  . BPH (benign prostatic hyperplasia)   . TIA (transient ischemic attack)   . Shortness of breath     exertion  . Headache(784.0)   . Dizziness   . Arthritis   . History of blood transfusion   . Pneumonia     hx of - hospitalized  . Carotid artery occlusion   . Stroke ([place]) 03-11-13  . DVT (deep venous thrombosis) [facility])    Past Surgical History  Procedure Laterality Date  . Total hip arthroplasty Right august 6th 2012    right side   . Replacement total knee Left 2006    left knee  . Transurethral resection of prostate  1996  . Knee arthroscopy Right 2010  . Joint replacement Right     hip  . Joint replacement Left    knee  . Carpel tunnel Right   . Endarterectomy Left 03/11/2013    Procedure: ENDARTERECTOMY CAROTID with Vascu-Guard Bovine Patch.;  Surgeon: Conrad Pleasure[place], MD;  Location: [place];  Service: Vascular;  Laterality: Left;  . Carotid endarterectomy Left 03-11-13    cea   Family History  Problem Relation Age of Onset  . Stroke Father   . Heart attack Father   . Hypertension    . Diabetes    . Hypertension Mother    Social History  Substance Use Topics  . Smoking status: Never Smoker   . Smokeless tobacco: Never Used  . Alcohol Use: No    Review of Systems  Constitutional: Negative for appetite change.  Respiratory: Positive for cough and shortness of breath.   Cardiovascular: Negative for chest pain.  Gastrointestinal: Negative for abdominal pain.  Genitourinary: Negative for flank pain.  Musculoskeletal: Negative for back pain.  Skin: Negative for wound.  Neurological: Negative for syncope.  Hematological: Negative for adenopathy.  Psychiatric/Behavioral: Negative for confusion.      Allergies  Sulfa antibiotics and Milk-related compounds  Home Medications   Prior to Admission medications   Medication Sig Start Date End Date Taking? Authorizing Provider  acetaminophen (TYLENOL) 500 MG tablet Take 1,000 mg by mouth every 6 (six) hours  as needed for pain.     Historical Provider, MD  aspirin EC 81 MG tablet Take 81 mg by mouth every morning.    Historical Provider, MD  Digestive Enzymes (DIGESTIVE ENZYME PO) Take 2 each by mouth at bedtime. Advantage Gummies Take 2 daily    Historical Provider, MD  docusate sodium (COLACE) 250 MG capsule Take 250 mg by mouth at bedtime as needed and may repeat dose one time if needed for constipation.    Historical Provider, MD  fish oil-omega-3 fatty acids 1000 MG capsule Take 300 mg by mouth every morning.     Historical Provider, MD  guaiFENesin-dextromethorphan (ROBITUSSIN DM) 100-10 MG/5ML syrup Take 5 mLs by mouth every 4 (four) hours  as needed for cough. 04/27/15   Davonna Belling, MD  levofloxacin (LEVAQUIN) 500 MG tablet Take 1 tablet (500 mg total) by mouth daily. 02/09/15   Jola Schmidt, MD  levothyroxine (SYNTHROID, LEVOTHROID) 50 MCG tablet Take 75 mcg by mouth daily before breakfast.     Historical Provider, MD  losartan-hydrochlorothiazide (HYZAAR) 50-12.5 MG per tablet Take 0.5 tablets by mouth every morning.    Historical Provider, MD  Nutritional Supplements (ENSURE COMPLETE PO) Take 237 mLs by mouth. Take every other day    Historical Provider, MD  Psyllium 28.3 % POWD Take 1 scoop by mouth daily. Two tsp daily    Historical Provider, MD   BP 149/70 mmHg  Pulse 61  Temp(Src) 98 F (36.7 C) (Oral)  Resp 15  Ht 5\' 10"  (1.778 m)  Wt 143 lb (64.864 kg)  BMI 20.52 kg/m2  SpO2 100% Physical Exam  Constitutional: He appears well-developed.  HENT:  Head: Atraumatic.  Neck: Neck supple. JVD present.  Cardiovascular: Normal rate and regular rhythm.   Pulmonary/Chest: Effort normal.  Few scattered rales in left upper lung field.  Abdominal: Soft.  Musculoskeletal: He exhibits no edema.  Neurological: He is alert.  Skin: No rash noted. No erythema.  Psychiatric: He has a normal mood and affect.    ED Course  Procedures (including critical care time) Labs Review Labs Reviewed - No data to display  Imaging Review Dg Chest 2 View  04/27/2015  CLINICAL DATA:  Productive cough, runny nose, sore throat. Symptoms for 2 days. EXAM: CHEST  2 VIEW COMPARISON:  02/11/2015. FINDINGS: Trachea is midline. Heart size normal. Thoracic aorta is calcified. Lungs are hyperinflated but clear. IMPRESSION: Hyperinflation without acute finding. Electronically Signed   By: Lorin Picket M.D.   On: 04/27/2015 08:21   I have personally reviewed and evaluated these images and lab results as part of my medical decision-making.   EKG Interpretation   Date/Time:  Monday April 27 2015 07:22:49 EST Ventricular Rate:  73 PR  Interval:    QRS Duration: 96 QT Interval:  430 QTC Calculation: 474 R Axis:   -37 Text Interpretation:  Atrial fibrillation Left axis deviation Probable  anteroseptal infarct, old Baseline wander in lead(s) V5 Confirmed by  Jehiel Koepp  MD, Jenelle Drennon [phone]) on 04/27/2015 7:31:37 AM      MDM   Final diagnoses:  URI (upper respiratory infection)    Patient with shortness of breath. Has cough productive of some white sputum. Negative x-ray and no localizing lung findings. Not hypoxic with ambulation. Will give antitussives have follow-up as needed with PCP. Does not appear to be pneumonia at this time.    Davonna Belling, MD 04/27/15 [phone]

## 2015-04-27 NOTE — ED Notes (Signed)
Pt states that he had pneumonia about a month ago, finished abx and was feeling better.  For the past 2 days has had a productive cough with clear sputum, runny nose, and sore throat.

## 2015-04-27 NOTE — ED Notes (Signed)
Pt ambulated with nurse and tech and kept 02 sats 98% consistently.

## 2015-04-28 DIAGNOSIS — J329 Chronic sinusitis, unspecified: Secondary | ICD-10-CM | POA: Diagnosis not present

## 2015-04-28 DIAGNOSIS — J029 Acute pharyngitis, unspecified: Secondary | ICD-10-CM | POA: Diagnosis not present

## 2015-04-28 DIAGNOSIS — J069 Acute upper respiratory infection, unspecified: Secondary | ICD-10-CM | POA: Diagnosis not present

## 2015-05-11 DIAGNOSIS — J019 Acute sinusitis, unspecified: Secondary | ICD-10-CM | POA: Diagnosis not present

## 2015-06-26 DIAGNOSIS — Z23 Encounter for immunization: Secondary | ICD-10-CM | POA: Diagnosis not present

## 2015-07-23 DIAGNOSIS — J329 Chronic sinusitis, unspecified: Secondary | ICD-10-CM | POA: Diagnosis not present

## 2015-07-23 DIAGNOSIS — J069 Acute upper respiratory infection, unspecified: Secondary | ICD-10-CM | POA: Diagnosis not present

## 2015-07-28 DIAGNOSIS — E039 Hypothyroidism, unspecified: Secondary | ICD-10-CM | POA: Diagnosis not present

## 2015-07-28 DIAGNOSIS — E782 Mixed hyperlipidemia: Secondary | ICD-10-CM | POA: Diagnosis not present

## 2015-07-31 DIAGNOSIS — E782 Mixed hyperlipidemia: Secondary | ICD-10-CM | POA: Diagnosis not present

## 2015-07-31 DIAGNOSIS — I10 Essential (primary) hypertension: Secondary | ICD-10-CM | POA: Diagnosis not present

## 2015-07-31 DIAGNOSIS — E039 Hypothyroidism, unspecified: Secondary | ICD-10-CM | POA: Diagnosis not present

## 2015-07-31 DIAGNOSIS — M25561 Pain in right knee: Secondary | ICD-10-CM | POA: Diagnosis not present

## 2015-08-18 DIAGNOSIS — Z96641 Presence of right artificial hip joint: Secondary | ICD-10-CM | POA: Diagnosis not present

## 2015-09-28 DIAGNOSIS — L570 Actinic keratosis: Secondary | ICD-10-CM | POA: Diagnosis not present

## 2015-09-28 DIAGNOSIS — C44311 Basal cell carcinoma of skin of nose: Secondary | ICD-10-CM | POA: Diagnosis not present

## 2015-09-28 DIAGNOSIS — L723 Sebaceous cyst: Secondary | ICD-10-CM | POA: Diagnosis not present

## 2015-09-28 DIAGNOSIS — X32XXXD Exposure to sunlight, subsequent encounter: Secondary | ICD-10-CM | POA: Diagnosis not present

## 2015-09-28 DIAGNOSIS — B079 Viral wart, unspecified: Secondary | ICD-10-CM | POA: Diagnosis not present

## 2015-10-07 DIAGNOSIS — J069 Acute upper respiratory infection, unspecified: Secondary | ICD-10-CM | POA: Diagnosis not present

## 2015-10-07 DIAGNOSIS — J329 Chronic sinusitis, unspecified: Secondary | ICD-10-CM | POA: Diagnosis not present

## 2015-10-07 DIAGNOSIS — J029 Acute pharyngitis, unspecified: Secondary | ICD-10-CM | POA: Diagnosis not present

## 2015-10-07 DIAGNOSIS — J40 Bronchitis, not specified as acute or chronic: Secondary | ICD-10-CM | POA: Diagnosis not present

## 2015-10-14 DIAGNOSIS — J3089 Other allergic rhinitis: Secondary | ICD-10-CM | POA: Diagnosis not present

## 2015-10-19 ENCOUNTER — Encounter: Payer: Self-pay | Admitting: Family

## 2015-10-23 ENCOUNTER — Ambulatory Visit (INDEPENDENT_AMBULATORY_CARE_PROVIDER_SITE_OTHER): Payer: Medicare Other | Admitting: Family

## 2015-10-23 ENCOUNTER — Ambulatory Visit (HOSPITAL_COMMUNITY)
Admission: RE | Admit: 2015-10-23 | Discharge: 2015-10-23 | Disposition: A | Discharge status: Home / Self Care | Payer: Medicare Other | Source: Ambulatory Visit | Attending: Family | Admitting: Family

## 2015-10-23 ENCOUNTER — Encounter: Payer: Self-pay | Admitting: Family

## 2015-10-23 VITALS — BP 110/58 | HR 54 | Temp 97.0°F | Resp 16 | Ht 70.0 in | Wt 148.0 lb

## 2015-10-23 DIAGNOSIS — I6522 Occlusion and stenosis of left carotid artery: Secondary | ICD-10-CM | POA: Diagnosis not present

## 2015-10-23 DIAGNOSIS — K219 Gastro-esophageal reflux disease without esophagitis: Secondary | ICD-10-CM | POA: Insufficient documentation

## 2015-10-23 DIAGNOSIS — Z48812 Encounter for surgical aftercare following surgery on the circulatory system: Secondary | ICD-10-CM | POA: Diagnosis not present

## 2015-10-23 DIAGNOSIS — I6521 Occlusion and stenosis of right carotid artery: Secondary | ICD-10-CM

## 2015-10-23 DIAGNOSIS — Z8673 Personal history of transient ischemic attack (TIA), and cerebral infarction without residual deficits: Secondary | ICD-10-CM

## 2015-10-23 DIAGNOSIS — Z9889 Other specified postprocedural states: Secondary | ICD-10-CM | POA: Diagnosis not present

## 2015-10-23 DIAGNOSIS — I2510 Atherosclerotic heart disease of native coronary artery without angina pectoris: Secondary | ICD-10-CM | POA: Diagnosis not present

## 2015-10-23 DIAGNOSIS — I10 Essential (primary) hypertension: Secondary | ICD-10-CM | POA: Insufficient documentation

## 2015-10-23 NOTE — Progress Notes (Signed)
Chief Complaint: Follow up Extracranial Carotid Artery Stenosis   History of Present Illness  Jorge Nixon is a 82 y.o. male patient of Dr. Bridgett Larsson who returns for follow up s/p L CEA (Date: 03/11/13).  He had 2 TIA's within a week before the left CEA as manifested by right arm tingling and weakness that resolved within a few minutes, no further TIA or stroke activity. Pt denies any history of amaurosis fugax, aphasia, or confusion.  He walks a mile, five days/week.   Pt reports New Medical or Surgical History: none  Pt Diabetic: No Pt smoker: non-smoker  Pt meds include: Statin : No ASA: yes, daily 81 mg Other anticoagulants/antiplatelets: no    Past Medical History  Diagnosis Date  . Hypertension   . Diverticular disease 02/14/2011    problems swallowing  . GERD (gastroesophageal reflux disease)   . Coronary artery disease   . Kidney stone   . Hypothyroidism   . Anemia   . COPD (chronic obstructive pulmonary disease) ([place])   . Non-ischemic cardiomyopathy ([place])     mild  EF on echo 05/03/2012 55 to 60%  . Nephrolithiasis     lithotripsy 2006  . BPH (benign prostatic hyperplasia)   . TIA (transient ischemic attack)   . Shortness of breath     exertion  . Headache(784.0)   . Dizziness   . Arthritis   . History of blood transfusion   . Pneumonia     hx of - hospitalized  . Carotid artery occlusion   . Stroke ([place]) 03-11-13  . DVT (deep venous thrombosis) ([place])     Social History Social History  Substance Use Topics  . Smoking status: Never Smoker   . Smokeless tobacco: Never Used  . Alcohol Use: No    Family History Family History  Problem Relation Age of Onset  . Stroke Father   . Heart attack Father   . Hypertension    . Diabetes    . Hypertension Mother     Surgical History Past Surgical History  Procedure Laterality Date  . Total hip arthroplasty Right august 6th 2012    right side   . Replacement total knee Left 2006    left knee  .  Transurethral resection of prostate  1996  . Knee arthroscopy Right 2010  . Joint replacement Right     hip  . Joint replacement Left     knee  . Carpel tunnel Right   . Endarterectomy Left 03/11/2013    Procedure: ENDARTERECTOMY CAROTID with Vascu-Guard Bovine Patch.;  Surgeon: Conrad [place], MD;  Location: [place];  Service: Vascular;  Laterality: Left;  . Carotid endarterectomy Left 03-11-13    cea    Allergies  Allergen Reactions  . Sulfa Antibiotics Rash  . Milk-Related Compounds Other (See Comments)    Upset stomach    Current Outpatient Prescriptions  Medication Sig Dispense Refill  . acetaminophen (TYLENOL) 500 MG tablet Take 1,000 mg by mouth every 6 (six) hours as needed for pain.     [name] aspirin EC 81 MG tablet Take 81 mg by mouth every morning.    . Digestive Enzymes (DIGESTIVE ENZYME PO) Take 2 each by mouth at bedtime. Advantage Gummies Take 2 daily    . docusate sodium (COLACE) 250 MG capsule Take 250 mg by mouth at bedtime as needed and may repeat dose one time if needed for constipation.    . fish oil-omega-3 fatty acids 1000 MG capsule Take  300 mg by mouth every morning.     [name] levothyroxine (SYNTHROID, LEVOTHROID) 50 MCG tablet Take 75 mcg by mouth daily before breakfast.     . losartan-hydrochlorothiazide (HYZAAR) 50-12.5 MG per tablet Take 0.5 tablets by mouth every morning.    . Nutritional Supplements (ENSURE COMPLETE PO) Take 237 mLs by mouth. Take every other day    . Psyllium 28.3 % POWD Take 1 scoop by mouth daily. Two tsp daily    . guaiFENesin-dextromethorphan (ROBITUSSIN DM) 100-10 MG/5ML syrup Take 5 mLs by mouth every 4 (four) hours as needed for cough. (Patient not taking: Reported on 10/23/2015) 118 mL 0  . levofloxacin (LEVAQUIN) 500 MG tablet Take 1 tablet (500 mg total) by mouth daily. (Patient not taking: Reported on 10/23/2015) 9 tablet 0   No current facility-administered medications for this visit.    Review of Systems : See HPI for pertinent  positives and negatives.  Physical Examination  Filed Vitals:   10/23/15 1013 10/23/15 1014  BP: 121/60 110/58  Pulse: 54 54  Temp: 97 F (36.1 C)   Resp: 16   Height: 5\' 10"  (1.778 m)   Weight: 148 lb (67.132 kg)   SpO2: 98%    Body mass index is 21.24 kg/(m^2).  General: WDWN male in NAD GAIT: using quad cane, slow, deliberate Eyes: PERRLA Pulmonary: Respirations are non-labored, CTAB,  Cardiac: Regular rhythm, no detected murmur.  VASCULAR EXAM Carotid Bruits Left Right   Negative Negative   Aorta is not palpable. Radial pulses are are 2+ palpable and =.      LE Pulses LEFT RIGHT   POPLITEAL not palpable  not palpable   POSTERIOR TIBIAL not palpable  not palpable    DORSALIS PEDIS  ANTERIOR TIBIAL 1+ palpable  1+ palpable     Gastrointestinal: soft, nontender, BS WNL, no r/g,no palpable masses.  Musculoskeletal: No muscle atrophy/wasting. M/S 4/5 in UE's, 3/5 in LE's, Extremities without ischemic changes.  Neurologic: A&O X 3; Appropriate Affect, Speech is normal CN 2-12 intactexcept has significant hearing loss, Pain and light touch intact in extremities, Motor exam as listed above.               Non-Invasive Vascular Imaging CAROTID DUPLEX 10/23/2015   CEREBROVASCULAR DUPLEX EVALUATION    INDICATION: Carotid artery disease    PREVIOUS INTERVENTION(S): Left carotid endarterectomy 03/11/2013    DUPLEX EXAM: Carotid duplex    RIGHT  LEFT  Peak Systolic Velocities (cm/s) End Diastolic Velocities (cm/s) Plaque LOCATION Peak Systolic Velocities (cm/s) End Diastolic Velocities (cm/s) Plaque  94 14  CCA PROXIMAL 103 21   91 18 HT CCA MID 90 21   86 16 HT CCA DISTAL 172 28   138 10 HT ECA 250 16   110 37 HT ICA PROXIMAL 168 24   108 34  ICA MID 71 21   94  32  ICA DISTAL 58 20     1.2 ICA / CCA Ratio (PSV) NA  Antegrade Vertebral Flow Antegrade abnormal  - Brachial Systolic Pressure (mmHg) -  Triphasic Brachial Artery Waveforms Triphasic    Plaque Morphology:  HM = Homogeneous, HT = Heterogeneous, CP = Calcific Plaque, SP = Smooth Plaque, IP = Irregular Plaque     ADDITIONAL FINDINGS: Multiphasic subclavian arteries    IMPRESSION: 1. 1 - 39%  right carotid artery stenosis, upper end of range 2. Patent left carotid endarterectomy site with slight increase of velocity in the proximal internal carotid artery  Compared to the previous exam:  Slight increase of left proximal internal carotid artery  since prior study 10/17/2014      Assessment: Jorge Nixon is a 82 y.o. male who is s/p L CEA (Date: 03/11/13); during surgery it was found that he had carotid stenosis >70%. He had 2 TIA's within a week before the left CEA as manifested by right arm tingling and weakness that resolved within a few minutes, no further TIA or stroke activity.   Today's Carotid Duplex suggests <40%  right carotid artery stenosis, upper end of range Patent left carotid endarterectomy site with slight increase of velocity in the proximal internal carotid artery. No significant change in end diastolic velocities (stenosis) compared to last exam on 10/17/14.  Fortunately he does not have DM, has never used tobacco, and remains physically active. He takes a daily ASA.  Plan: Follow-up in 1 year with Carotid Duplex scan.   I discussed in depth with the patient the nature of atherosclerosis, and emphasized the importance of maximal medical management including strict control of blood pressure, blood glucose, and lipid levels, obtaining regular exercise, and continued cessation of smoking.  The patient is aware that without maximal medical management the underlying atherosclerotic disease process will progress, limiting the benefit of any interventions. The patient was  given information about stroke prevention and what symptoms should prompt the patient to seek immediate medical care. Thank you for allowing [place] to participate in this patient's care.  Clemon Chambers, RN, MSN, FNP-C [facility] Office: [phone]  Clinic Physician: Early on call  10/23/2015 10:30 AM     VASCULAR QUALITY INITIATIVE FOLLOW UP DATA:  Current smoker: [  ] yes  [ X ] no  Living status: [ X ]  Home  [  ] Nursing home  [  ] Homeless    MEDS:  ASA Valu.Nieves  ] yes  [  ] no- [  ] medical reason  [  ] non compliant  STATIN  [  ] yes  Valu.Nieves  ] no- [ X ] medical reason  [  ] non compliant  Beta blocker [  ] yes  Valu.Nieves  ] no- [ X ] medical reason  [  ] non compliant  ACE inhibitor [  ] yes  [X no- Valu.Nieves  ] medical reason  [  ] non compliant  P2Y12 Antagonist [ X] none  [  ] clopidogrel-Plavix  [  ] ticlopidine-Ticlid   [  ] prasugrel-Effient  [  ] ticagrelor- Brilinta    Anticoagulant Valu.Nieves  ] None  [  ] warfarin  [  ] rivaroxaban-Xarelto [  ] dabigatran- Pradaxa  Neurologic event since D/C:  [ X ] no  [  ] yes: [  ] eye event  [  ] cortical event  [  ] VB event  [  ] non specific event  [  ] right  [  ] left  [  ] TIA  [  ] stroke  Date:   Modified Rankin Score: 0

## 2015-10-23 NOTE — Patient Instructions (Signed)
Stroke Prevention Some medical conditions and behaviors are associated with an increased chance of having a stroke. You may prevent a stroke by making healthy choices and managing medical conditions. HOW CAN I REDUCE MY RISK OF HAVING A STROKE?   Stay physically active. Get at least 30 minutes of activity on most or all days.  Do not smoke. It may also be helpful to avoid exposure to secondhand smoke.  Limit alcohol use. Moderate alcohol use is considered to be:  No more than 2 drinks per day for men.  No more than 1 drink per day for nonpregnant women.  Eat healthy foods. This involves:  Eating 5 or more servings of fruits and vegetables a day.  Making dietary changes that address high blood pressure (hypertension), high cholesterol, diabetes, or obesity.  Manage your cholesterol levels.  Making food choices that are high in fiber and low in saturated fat, trans fat, and cholesterol may control cholesterol levels.  Take any prescribed medicines to control cholesterol as directed by your health care provider.  Manage your diabetes.  Controlling your carbohydrate and sugar intake is recommended to manage diabetes.  Take any prescribed medicines to control diabetes as directed by your health care provider.  Control your hypertension.  Making food choices that are low in salt (sodium), saturated fat, trans fat, and cholesterol is recommended to manage hypertension.  Ask your health care provider if you need treatment to lower your blood pressure. Take any prescribed medicines to control hypertension as directed by your health care provider.  If you are 18-39 years of age, have your blood pressure checked every 3-5 years. If you are 40 years of age or older, have your blood pressure checked every year.  Maintain a healthy weight.  Reducing calorie intake and making food choices that are low in sodium, saturated fat, trans fat, and cholesterol are recommended to manage  weight.  Stop drug abuse.  Avoid taking birth control pills.  Talk to your health care provider about the risks of taking birth control pills if you are over 35 years old, smoke, get migraines, or have ever had a blood clot.  Get evaluated for sleep disorders (sleep apnea).  Talk to your health care provider about getting a sleep evaluation if you snore a lot or have excessive sleepiness.  Take medicines only as directed by your health care provider.  For some people, aspirin or blood thinners (anticoagulants) are helpful in reducing the risk of forming abnormal blood clots that can lead to stroke. If you have the irregular heart rhythm of atrial fibrillation, you should be on a blood thinner unless there is a good reason you cannot take them.  Understand all your medicine instructions.  Make sure that other conditions (such as anemia or atherosclerosis) are addressed. SEEK IMMEDIATE MEDICAL CARE IF:   You have sudden weakness or numbness of the face, arm, or leg, especially on one side of the body.  Your face or eyelid droops to one side.  You have sudden confusion.  You have trouble speaking (aphasia) or understanding.  You have sudden trouble seeing in one or both eyes.  You have sudden trouble walking.  You have dizziness.  You have a loss of balance or coordination.  You have a sudden, severe headache with no known cause.  You have new chest pain or an irregular heartbeat. Any of these symptoms may represent a serious problem that is an emergency. Do not wait to see if the symptoms will   go away. Get medical help at once. Call your local emergency services (911 in [place] Do not drive yourself to the hospital.   This information is not intended to replace advice given to you by your health care provider. Make sure you discuss any questions you have with your health care provider.   Document Released: 06/23/2004 Document Revised: 06/06/2014 Document Reviewed:  11/16/2012 Elsevier Interactive Patient Education 2016 [employer] Inc.  

## 2015-11-10 DIAGNOSIS — Z96641 Presence of right artificial hip joint: Secondary | ICD-10-CM | POA: Diagnosis not present

## 2015-12-25 NOTE — Addendum Note (Signed)
Addended by: Reola Calkins on: 12/25/2015 02:27 PM   Modules accepted: Orders

## 2016-02-08 DIAGNOSIS — L89211 Pressure ulcer of right hip, stage 1: Secondary | ICD-10-CM | POA: Diagnosis not present

## 2016-02-10 DIAGNOSIS — D509 Iron deficiency anemia, unspecified: Secondary | ICD-10-CM | POA: Diagnosis not present

## 2016-02-10 DIAGNOSIS — E782 Mixed hyperlipidemia: Secondary | ICD-10-CM | POA: Diagnosis not present

## 2016-02-10 DIAGNOSIS — E039 Hypothyroidism, unspecified: Secondary | ICD-10-CM | POA: Diagnosis not present

## 2016-02-12 DIAGNOSIS — L89211 Pressure ulcer of right hip, stage 1: Secondary | ICD-10-CM | POA: Diagnosis not present

## 2016-02-12 DIAGNOSIS — Z23 Encounter for immunization: Secondary | ICD-10-CM | POA: Diagnosis not present

## 2016-02-12 DIAGNOSIS — E782 Mixed hyperlipidemia: Secondary | ICD-10-CM | POA: Diagnosis not present

## 2016-02-12 DIAGNOSIS — Z0001 Encounter for general adult medical examination with abnormal findings: Secondary | ICD-10-CM | POA: Diagnosis not present

## 2016-02-12 DIAGNOSIS — E039 Hypothyroidism, unspecified: Secondary | ICD-10-CM | POA: Diagnosis not present

## 2016-02-12 DIAGNOSIS — I10 Essential (primary) hypertension: Secondary | ICD-10-CM | POA: Diagnosis not present

## 2016-02-19 DIAGNOSIS — Z23 Encounter for immunization: Secondary | ICD-10-CM | POA: Diagnosis not present

## 2016-03-10 DIAGNOSIS — L570 Actinic keratosis: Secondary | ICD-10-CM | POA: Diagnosis not present

## 2016-03-10 DIAGNOSIS — Z85828 Personal history of other malignant neoplasm of skin: Secondary | ICD-10-CM | POA: Diagnosis not present

## 2016-03-10 DIAGNOSIS — X32XXXD Exposure to sunlight, subsequent encounter: Secondary | ICD-10-CM | POA: Diagnosis not present

## 2016-03-10 DIAGNOSIS — Z08 Encounter for follow-up examination after completed treatment for malignant neoplasm: Secondary | ICD-10-CM | POA: Diagnosis not present

## 2016-03-16 DIAGNOSIS — S40811A Abrasion of right upper arm, initial encounter: Secondary | ICD-10-CM | POA: Diagnosis not present

## 2016-03-30 DIAGNOSIS — Z9181 History of falling: Secondary | ICD-10-CM | POA: Diagnosis not present

## 2016-03-30 DIAGNOSIS — S40811D Abrasion of right upper arm, subsequent encounter: Secondary | ICD-10-CM | POA: Diagnosis not present

## 2016-06-11 ENCOUNTER — Emergency Department (HOSPITAL_COMMUNITY): Payer: Medicare Other

## 2016-06-11 ENCOUNTER — Inpatient Hospital Stay (HOSPITAL_COMMUNITY)
Admission: EM | Admit: 2016-06-11 | Discharge: 2016-06-14 | DRG: 041 | Disposition: A | Discharge status: Home Health | Payer: Medicare Other | Attending: Neurology | Admitting: Neurology

## 2016-06-11 ENCOUNTER — Encounter (HOSPITAL_COMMUNITY): Payer: Self-pay | Admitting: Emergency Medicine

## 2016-06-11 DIAGNOSIS — E785 Hyperlipidemia, unspecified: Secondary | ICD-10-CM | POA: Diagnosis present

## 2016-06-11 DIAGNOSIS — M1990 Unspecified osteoarthritis, unspecified site: Secondary | ICD-10-CM | POA: Diagnosis present

## 2016-06-11 DIAGNOSIS — E039 Hypothyroidism, unspecified: Secondary | ICD-10-CM | POA: Diagnosis present

## 2016-06-11 DIAGNOSIS — K219 Gastro-esophageal reflux disease without esophagitis: Secondary | ICD-10-CM | POA: Diagnosis present

## 2016-06-11 DIAGNOSIS — R29701 NIHSS score 1: Secondary | ICD-10-CM | POA: Diagnosis present

## 2016-06-11 DIAGNOSIS — Z8673 Personal history of transient ischemic attack (TIA), and cerebral infarction without residual deficits: Secondary | ICD-10-CM

## 2016-06-11 DIAGNOSIS — I6521 Occlusion and stenosis of right carotid artery: Secondary | ICD-10-CM | POA: Diagnosis present

## 2016-06-11 DIAGNOSIS — Z96652 Presence of left artificial knee joint: Secondary | ICD-10-CM | POA: Diagnosis present

## 2016-06-11 DIAGNOSIS — I10 Essential (primary) hypertension: Secondary | ICD-10-CM | POA: Diagnosis present

## 2016-06-11 DIAGNOSIS — I428 Other cardiomyopathies: Secondary | ICD-10-CM | POA: Diagnosis present

## 2016-06-11 DIAGNOSIS — Z86718 Personal history of other venous thrombosis and embolism: Secondary | ICD-10-CM

## 2016-06-11 DIAGNOSIS — J449 Chronic obstructive pulmonary disease, unspecified: Secondary | ICD-10-CM | POA: Diagnosis present

## 2016-06-11 DIAGNOSIS — Z7982 Long term (current) use of aspirin: Secondary | ICD-10-CM | POA: Diagnosis not present

## 2016-06-11 DIAGNOSIS — I6789 Other cerebrovascular disease: Secondary | ICD-10-CM | POA: Diagnosis not present

## 2016-06-11 DIAGNOSIS — N400 Enlarged prostate without lower urinary tract symptoms: Secondary | ICD-10-CM | POA: Diagnosis present

## 2016-06-11 DIAGNOSIS — E876 Hypokalemia: Secondary | ICD-10-CM | POA: Diagnosis present

## 2016-06-11 DIAGNOSIS — Z79899 Other long term (current) drug therapy: Secondary | ICD-10-CM

## 2016-06-11 DIAGNOSIS — I63012 Cerebral infarction due to thrombosis of left vertebral artery: Secondary | ICD-10-CM | POA: Diagnosis not present

## 2016-06-11 DIAGNOSIS — I2510 Atherosclerotic heart disease of native coronary artery without angina pectoris: Secondary | ICD-10-CM | POA: Diagnosis present

## 2016-06-11 DIAGNOSIS — I6529 Occlusion and stenosis of unspecified carotid artery: Secondary | ICD-10-CM

## 2016-06-11 DIAGNOSIS — I63411 Cerebral infarction due to embolism of right middle cerebral artery: Secondary | ICD-10-CM | POA: Diagnosis not present

## 2016-06-11 DIAGNOSIS — Z9889 Other specified postprocedural states: Secondary | ICD-10-CM

## 2016-06-11 DIAGNOSIS — I639 Cerebral infarction, unspecified: Principal | ICD-10-CM | POA: Diagnosis present

## 2016-06-11 DIAGNOSIS — Z96641 Presence of right artificial hip joint: Secondary | ICD-10-CM | POA: Diagnosis present

## 2016-06-11 LAB — COMPREHENSIVE METABOLIC PANEL
ALT: 15 U/L — AB (ref 17–63)
AST: 35 U/L (ref 15–41)
Albumin: 4.3 g/dL (ref 3.5–5.0)
Alkaline Phosphatase: 60 U/L (ref 38–126)
Anion gap: 9 (ref 5–15)
BILIRUBIN TOTAL: 0.7 mg/dL (ref 0.3–1.2)
BUN: 22 mg/dL — AB (ref 6–20)
CALCIUM: 9.4 mg/dL (ref 8.9–10.3)
CO2: 29 mmol/L (ref 22–32)
CREATININE: 1.2 mg/dL (ref 0.61–1.24)
Chloride: 102 mmol/L (ref 101–111)
GFR calc Af Amer: 60 mL/min — ABNORMAL LOW (ref >60)
GFR, EST NON AFRICAN AMERICAN: 52 mL/min — AB (ref >60)
Glucose, Bld: 102 mg/dL — ABNORMAL HIGH (ref 65–99)
Potassium: 3.6 mmol/L (ref 3.5–5.1)
Sodium: 140 mmol/L (ref 135–145)
TOTAL PROTEIN: 7.7 g/dL (ref 6.5–8.1)

## 2016-06-11 LAB — DIFFERENTIAL
BASOS ABS: 0 10*3/uL (ref 0.0–0.1)
Basophils Relative: 1 %
Eosinophils Absolute: 0.1 10*3/uL (ref 0.0–0.7)
Eosinophils Relative: 2 %
LYMPHS ABS: 1 10*3/uL (ref 0.7–4.0)
Lymphocytes Relative: 20 %
Monocytes Absolute: 0.5 10*3/uL (ref 0.1–1.0)
Monocytes Relative: 10 %
NEUTROS ABS: 3.4 10*3/uL (ref 1.7–7.7)
Neutrophils Relative %: 67 %

## 2016-06-11 LAB — I-STAT CHEM 8, ED
BUN: 22 mg/dL — ABNORMAL HIGH (ref 6–20)
CALCIUM ION: 1.19 mmol/L (ref 1.15–1.40)
CREATININE: 1.2 mg/dL (ref 0.61–1.24)
Chloride: 100 mmol/L — ABNORMAL LOW (ref 101–111)
GLUCOSE: 103 mg/dL — AB (ref 65–99)
HCT: 40 % (ref 39.0–52.0)
HEMOGLOBIN: 13.6 g/dL (ref 13.0–17.0)
POTASSIUM: 3.8 mmol/L (ref 3.5–5.1)
Sodium: 142 mmol/L (ref 135–145)
TCO2: 28 mmol/L (ref 0–100)

## 2016-06-11 LAB — PROTIME-INR
INR: 1.03
Prothrombin Time: 13.5 seconds (ref 11.4–15.2)

## 2016-06-11 LAB — RAPID URINE DRUG SCREEN, HOSP PERFORMED
Amphetamines: NOT DETECTED
BARBITURATES: NOT DETECTED
BENZODIAZEPINES: NOT DETECTED
Cocaine: NOT DETECTED
Opiates: NOT DETECTED
Tetrahydrocannabinol: NOT DETECTED

## 2016-06-11 LAB — APTT: APTT: 27 s (ref 24–36)

## 2016-06-11 LAB — URINALYSIS, ROUTINE W REFLEX MICROSCOPIC
BILIRUBIN URINE: NEGATIVE
GLUCOSE, UA: NEGATIVE mg/dL
Hgb urine dipstick: NEGATIVE
KETONES UR: NEGATIVE mg/dL
Leukocytes, UA: NEGATIVE
Nitrite: NEGATIVE
PH: 5 (ref 5.0–8.0)
Protein, ur: NEGATIVE mg/dL
Specific Gravity, Urine: 1.019 (ref 1.005–1.030)

## 2016-06-11 LAB — CBC
HEMATOCRIT: 39.7 % (ref 39.0–52.0)
HEMOGLOBIN: 13.2 g/dL (ref 13.0–17.0)
MCH: 29.6 pg (ref 26.0–34.0)
MCHC: 33.2 g/dL (ref 30.0–36.0)
MCV: 89 fL (ref 78.0–100.0)
Platelets: 140 10*3/uL — ABNORMAL LOW (ref 150–400)
RBC: 4.46 MIL/uL (ref 4.22–5.81)
RDW: 14.2 % (ref 11.5–15.5)
WBC: 5 10*3/uL (ref 4.0–10.5)

## 2016-06-11 LAB — I-STAT TROPONIN, ED: TROPONIN I, POC: 0.03 ng/mL (ref 0.00–0.08)

## 2016-06-11 LAB — MRSA PCR SCREENING: MRSA BY PCR: NEGATIVE

## 2016-06-11 LAB — ETHANOL: Alcohol, Ethyl (B): <5 mg/dL (ref <5)

## 2016-06-11 LAB — CBG MONITORING, ED: GLUCOSE-CAPILLARY: 97 mg/dL (ref 65–99)

## 2016-06-11 MED ORDER — ACETAMINOPHEN 160 MG/5ML PO SOLN: 650.0000 mg | ORAL | Status: DC | PRN

## 2016-06-11 MED ORDER — LABETALOL HCL 5 MG/ML IV SOLN
10.0000 mg | INTRAVENOUS | Status: DC | PRN
  Filled 2016-06-11: qty 4

## 2016-06-11 MED ORDER — ALTEPLASE 100 MG IV SOLR
INTRAVENOUS | Status: AC
  Filled 2016-06-11: qty 100

## 2016-06-11 MED ORDER — HYDROCHLOROTHIAZIDE 10 MG/ML ORAL SUSPENSION
6.2500 mg | Freq: Every day | ORAL | Status: DC
  Filled 2016-06-11: qty 1.25

## 2016-06-11 MED ORDER — SODIUM CHLORIDE 0.9 % IV SOLN
50.0000 mL | Freq: Once | INTRAVENOUS | Status: AC
  Administered 2016-06-11: 50 mL via INTRAVENOUS

## 2016-06-11 MED ORDER — PANTOPRAZOLE SODIUM 40 MG IV SOLR
40.0000 mg | Freq: Every day | INTRAVENOUS | Status: DC
  Administered 2016-06-11: 40 mg via INTRAVENOUS
  Filled 2016-06-11: qty 40

## 2016-06-11 MED ORDER — HYDRALAZINE HCL 20 MG/ML IJ SOLN
10.0000 mg | INTRAMUSCULAR | Status: DC | PRN
  Administered 2016-06-11: 10 mg via INTRAVENOUS
  Filled 2016-06-11: qty 1

## 2016-06-11 MED ORDER — ACETAMINOPHEN 650 MG RE SUPP: 650.0000 mg | RECTAL | Status: DC | PRN

## 2016-06-11 MED ORDER — ALTEPLASE (STROKE) FULL DOSE INFUSION
0.9000 mg/kg | Freq: Once | INTRAVENOUS | Status: AC
  Administered 2016-06-11: 57.1 mg via INTRAVENOUS

## 2016-06-11 MED ORDER — LOSARTAN POTASSIUM 50 MG PO TABS: 25.0000 mg | ORAL_TABLET | Freq: Every day | ORAL | Status: DC

## 2016-06-11 MED ORDER — PSYLLIUM 95 % PO PACK
1.0000 | PACK | Freq: Every day | ORAL | Status: DC
  Administered 2016-06-12 – 2016-06-13 (×2): 1 via ORAL
  Filled 2016-06-11 (×3): qty 1

## 2016-06-11 MED ORDER — LEVOTHYROXINE SODIUM 75 MCG PO TABS
75.0000 ug | ORAL_TABLET | Freq: Every day | ORAL | Status: DC
  Administered 2016-06-12 – 2016-06-14 (×2): 75 ug via ORAL
  Filled 2016-06-11 (×2): qty 1

## 2016-06-11 MED ORDER — SENNA 8.6 MG PO TABS
1.0000 | ORAL_TABLET | Freq: Every day | ORAL | Status: DC
  Administered 2016-06-11 – 2016-06-13 (×3): 8.6 mg via ORAL
  Filled 2016-06-11 (×3): qty 1

## 2016-06-11 MED ORDER — LOSARTAN POTASSIUM-HCTZ 50-12.5 MG PO TABS: 0.5000 | ORAL_TABLET | Freq: Every morning | ORAL | Status: DC

## 2016-06-11 MED ORDER — SODIUM CHLORIDE 0.9 % IV SOLN
INTRAVENOUS | Status: DC
  Administered 2016-06-11 – 2016-06-12 (×2): via INTRAVENOUS

## 2016-06-11 MED ORDER — ACETAMINOPHEN 325 MG PO TABS
650.0000 mg | ORAL_TABLET | ORAL | Status: DC | PRN
  Administered 2016-06-12: 650 mg via ORAL
  Filled 2016-06-11: qty 2

## 2016-06-11 MED ORDER — STROKE: EARLY STAGES OF RECOVERY BOOK
Freq: Once | Status: AC
  Administered 2016-06-11: 1
  Filled 2016-06-11: qty 1

## 2016-06-11 MED ORDER — PSYLLIUM 95 % PO PACK
1.0000 | PACK | Freq: Every day | ORAL | Status: DC
  Filled 2016-06-11: qty 1

## 2016-06-11 MED ORDER — NICARDIPINE HCL IN NACL 20-0.86 MG/200ML-% IV SOLN: 0.0000 mg/h | INTRAVENOUS | Status: DC

## 2016-06-11 NOTE — H&P (Addendum)
Neurology Consultation Reason for Admission: Stroke s/p tPA Referring Physician: Fredia Sorrow MD  CC: Left arm weakness and numbness  History is obtained from: Patient  HPI: Jorge Nixon is a 83 y.o. male right hand dominant presented to [facility] with acute onset of left arm weakness and numbness around 8AM. He reports he was driving to [place] when this happened and he continued to drive. He then decided to come to ER because it wasn't getting better and he was concerned about a stroke. He reports a stroke 2 years ago which causes right sided weakness but now back to normal. He had to get carotid endarterectomy after that stroke. He took aspirin for 2 weeks but then reduced the dose to 81mg  daily. He reports taking 81mg  aspirin every day and last dose was this morning. His NIHSS was the OSH was 4 and teleneurology recommended tPA which was administered at 1045AM. He reports his symptoms are improving but he is still having difficulty closing his left arm.   LKW: 8AM tpa given?: Yes NIHSS: 1 (possible left arm dysmetria. Has difficulty left thumb and index finger closing) Premorbid modified rankin scale: 0  ROS: A 14 point ROS was performed and is negative except as noted in the HPI.  Past Medical History:  Diagnosis Date  . Anemia   . Arthritis   . BPH (benign prostatic hyperplasia)   . Carotid artery occlusion   . COPD (chronic obstructive pulmonary disease) ([place])   . Coronary artery disease   . Diverticular disease 02/14/2011   problems swallowing  . Dizziness   . DVT (deep venous thrombosis) ([place])   . GERD (gastroesophageal reflux disease)   . Headache(784.0)   . History of blood transfusion   . Hypertension   . Hypothyroidism   . Kidney stone   . Nephrolithiasis    lithotripsy 2006  . Non-ischemic cardiomyopathy ([place])    mild  EF on echo 05/03/2012 55 to 60%  . Pneumonia    hx of - hospitalized  . Shortness of breath    exertion  . Stroke ([place]) 03-11-13  .  TIA (transient ischemic attack)     Family History  Problem Relation Age of Onset  . Stroke Father   . Heart attack Father   . Hypertension Mother   . Hypertension    . Diabetes      Social History:  reports that he has never smoked. He has never used smokeless tobacco. He reports that he does not drink alcohol or use drugs.  Exam: Current vital signs: BP (!) 175/81   Pulse 62   Temp 98.4 F (36.9 C) (Oral)   Resp 18   Ht 5\' 11"  (1.803 m)   Wt 63.5 kg (140 lb)   SpO2 97%   BMI 19.53 kg/m  Vital signs in last 24 hours: Temp:  [97.7 F (36.5 C)-98.4 F (36.9 C)] 98.4 F (36.9 C) (01/13 1416) Pulse Rate:  [52-70] 62 (01/13 1415) Resp:  [12-19] 18 (01/13 1415) BP: (141-192)/(51-146) 175/81 (01/13 1415) SpO2:  [96 %-100 %] 97 % (01/13 1245) Weight:  [63.5 kg (140 lb)] 63.5 kg (140 lb) (01/13 0944)   Physical Exam  Constitutional: Appears well-developed and well-nourished.  Psych: Affect appropriate to situation Eyes: No scleral injection HENT: No neck rigidity. No carotid bruit. Head: Normocephalic.  Cardiovascular: Normal rate and regular rhythm.  Respiratory: Effort normal and breath sounds normal to anterior ascultation GI: Soft.  No distension. There is no tenderness.  Neuro: Mental Status: Patient is awake, alert, oriented to person, place, month, year, and situation. Patient is able to give a clear and coherent history. No signs of aphasia or neglect Cranial Nerves: II: Visual Fields are full. Pupils are equal, round, and reactive to light. III,IV, VI: EOMI without ptosis or diploplia.  V: Facial sensation is symmetric to temperature VII: Facial movement is symmetric.  VIII: hearing is intact to voice X: Uvula elevates symmetrically XI: Shoulder shrug is symmetric. XII: tongue is midline without atrophy or fasciculations.  Motor: Tone is normal. Bulk is normal. 5/5 strength was present in all four extremities. He does have left thumb and index finger  slightly flexed position. Sensory: Sensation is symmetric to light touch and temperature in the arms and legs. Deep Tendon Reflexes: Diminished throughout. Plantars: Toes are downgoing bilaterally. Cerebellar: Very mild difficulty with left arm FNF.   I have reviewed the images obtained: CT head reviewed and showed no evidence of acute stroke or hemorrhage.  Impression: 83 years old male with acute onset of left arm weakness and numbness. He was treated with IV tPA by teleneurology and is now here for post tPA care and stroke investigation. Likely etiology of the stroke is small vessel atherosclerosis disease.  Recommendations: -Post tPA care in Neuro ICU. -No aspirin or pharmacological DVT prophylaxis. SCD are fine. -TTE to look for cardioembolism although less likley -Tele till in hospital. -Repeat CT head tomorrow. -MRI/MRA head and neck to look for stroke size and etiology. -BP goal <185/105  HTN -Resumed home medications  Hypothyroidism - Cont with Levothryoxine.  PT/OT/ST evaluation

## 2016-06-11 NOTE — ED Triage Notes (Signed)
Pt reports being at [employer] this morning at 0800 and noticed difficulty speaking.  Drove himself to [facility] and was unable to use left arm to open the door, so he drove himself home.  Blew the horn and his wife came out and called 911.

## 2016-06-11 NOTE — ED Notes (Signed)
Called SOC. 

## 2016-06-11 NOTE — ED Notes (Signed)
Report to Sonia Side, EMT-P by Shirlean Mylar, RN as well as this [facility] transferred to [facility]

## 2016-06-11 NOTE — ED Notes (Signed)
Dr. Helane Gunther at bedside evaluating pt.

## 2016-06-11 NOTE — ED Notes (Signed)
Call to daughter to give room #

## 2016-06-11 NOTE — ED Notes (Addendum)
DAUGHTER- LOLA  WISHES TO BE CALLED FOR TRANSFER OR CHANGE IN CONDITION [phone]

## 2016-06-11 NOTE — Evaluation (Signed)
Clinical/Bedside Swallow Evaluation Patient Details  Name: Jorge Nixon MRN: [id number] Date of Birth: 02/24/1925  Today's Date: 06/11/2016 Time: SLP Start Time (ACUTE ONLY): 42 SLP Stop Time (ACUTE ONLY): 1520 SLP Time Calculation (min) (ACUTE ONLY): 15 min  Past Medical History:  Past Medical History:  Diagnosis Date  . Anemia   . Arthritis   . BPH (benign prostatic hyperplasia)   . Carotid artery occlusion   . COPD (chronic obstructive pulmonary disease) ([place])   . Coronary artery disease   . Diverticular disease 02/14/2011   problems swallowing  . Dizziness   . DVT (deep venous thrombosis) ([place])   . GERD (gastroesophageal reflux disease)   . Headache(784.0)   . History of blood transfusion   . Hypertension   . Hypothyroidism   . Kidney stone   . Nephrolithiasis    lithotripsy 2006  . Non-ischemic cardiomyopathy ([place])    mild  EF on echo 05/03/2012 55 to 60%  . Pneumonia    hx of - hospitalized  . Shortness of breath    exertion  . Stroke ([place]) 03-11-13  . TIA (transient ischemic attack)    Past Surgical History:  Past Surgical History:  Procedure Laterality Date  . CAROTID ENDARTERECTOMY Left 03-11-13   cea  . carpel tunnel Right   . ENDARTERECTOMY Left 03/11/2013   Procedure: ENDARTERECTOMY CAROTID with Vascu-Guard Bovine Patch.;  Surgeon: Conrad Bishop, MD;  Location: [facility];  Service: Vascular;  Laterality: Left;  . JOINT REPLACEMENT Right    hip  . JOINT REPLACEMENT Left    knee  . KNEE ARTHROSCOPY Right 2010  . REPLACEMENT TOTAL KNEE Left 2006   left knee  . TOTAL HIP ARTHROPLASTY Right august 6th 2012   right side   . TRANSURETHRAL RESECTION OF PROSTATE  1996   HPI:  Pt is an 83 y.o. male who presented to [facility] 1/13 with L arm weakness and numbness. Head CT showed no acute abnormalities. Pt reportedly with history of swallowing difficulty so bedside swallow eval was ordered.   Assessment / Plan / Recommendation Clinical Impression  Pt showed no  overt s/s of aspiration at bedside. Pt did have esophagus dilated ~2 years ago; reports no difficulty other than needing to chew or crush large pills. Recommend initiating regular diet, thin liquids, meds whole with liquid or crushed if large. SLP will sign off at this time; please re-consult if needs arise.    Aspiration Risk  Mild aspiration risk    Diet Recommendation Regular;Thin liquid   Liquid Administration via: Cup;Straw Medication Administration: Whole meds with liquid Supervision: Patient able to self feed Postural Changes: Seated upright at 90 degrees    Other  Recommendations Oral Care Recommendations: Oral care BID   Follow up Recommendations None      Frequency and Duration            Prognosis        Swallow Study   General HPI: Pt is an 83 y.o. male who presented to [facility] 1/13 with L arm weakness and numbness. Head CT showed no acute abnormalities. Pt reportedly with history of swallowing difficulty so bedside swallow eval was ordered. Type of Study: Bedside Swallow Evaluation Previous Swallow Assessment: none in chart Diet Prior to this Study: NPO Temperature Spikes Noted: No Respiratory Status: Room air History of Recent Intubation: No Behavior/Cognition: Alert;Cooperative;Pleasant mood Oral Cavity Assessment: Within Functional Limits Oral Cavity - Dentition: Adequate natural dentition Vision: Functional for self-feeding Self-Feeding Abilities:  Able to feed self Patient Positioning: Upright in bed Baseline Vocal Quality: Normal Volitional Cough: Strong Volitional Swallow: Able to elicit    Oral/Motor/Sensory Function Overall Oral Motor/Sensory Function: Within functional limits   Ice Chips Ice chips: Not tested   Thin Liquid Thin Liquid: Within functional limits Presentation: Cup;Straw    Nectar Thick Nectar Thick Liquid: Not tested   Honey Thick Honey Thick Liquid: Not tested   Puree Puree: Not tested   Solid   GO   Solid: Within  functional limits Presentation: Self Madison Hickman, MA, CCC-SLP 06/11/2016,3:22 PM [phone]

## 2016-06-11 NOTE — Progress Notes (Signed)
SLP Cancellation Note  Patient Details Name: Jorge Nixon MRN: [id number] DOB: [birth year]   Cancelled treatment:       Reason Eval/Treat Not Completed: SLP screened (for cognitive-linguistic skills), no needs identified, will sign off. Head CT negative for stroke. Pt reports no changes in speech/ language or cognition. RN reported pt has been alert/ oriented x4. Will sign off; please re-consult if needs arise.   Kern Reap, [place], CCC-SLP 06/11/2016, 3:23 PM [phone]

## 2016-06-11 NOTE — Progress Notes (Signed)
Pt arrived to unit at 1335 and was connected to the monitor. Q15 vitals were started.

## 2016-06-11 NOTE — Progress Notes (Signed)
CODE STROKE 0945 CALL TIME 0948 BEEPER TIME 0950 EXAM BEGAN 0957 EXAM FINISHED,IMAGES TO SOC,EXAM COMPLETED IN EPIC [phone] CALLED [place], spoke with Marzetta Board

## 2016-06-11 NOTE — ED Notes (Signed)
Beeped out CODE STROKE and called CT.

## 2016-06-11 NOTE — ED Notes (Signed)
Visitor to bedside.  

## 2016-06-11 NOTE — ED Notes (Signed)
[facility] here

## 2016-06-11 NOTE — ED Notes (Signed)
Dr Earnest Conroy in to [employer]

## 2016-06-11 NOTE — ED Notes (Signed)
Report given to Jerry with [facility]  

## 2016-06-11 NOTE — ED Notes (Signed)
Pt is concerned that his spouse does not drive and he is concerned about transfer

## 2016-06-11 NOTE — ED Provider Notes (Signed)
[facility] DEPT Provider Note   CSN: [id number] Arrival date & time: 06/11/16  [id number]  An emergency department physician performed an initial assessment on this suspected stroke patient at 0949.  By signing my name below, I, Jorge Nixon, attest that this documentation has been prepared under the direction and in the presence of Jorge Sorrow, MD . Electronically Signed: Jeanell Nixon, Scribe. 06/11/2016. 9:50 AM.  History   Chief Complaint Chief Complaint  Patient presents with  . Weakness   The history is provided by the patient and the EMS personnel. No language interpreter was used.  Weakness  The current episode started 1 to 2 hours ago. The problem has been gradually improving. There was left upper extremity focality noted. There has been no fever. There were no medications administered prior to arrival. Associated medical issues include CVA.   LEVEL 5 CAVEAT DUE TO ACUITY OF CONDITION   HPI Comments: Jorge Nixon is a 83 y.o. male with a PMHx of CVA who presents to the Emergency Department complaining of constant moderate LUE weakness that started about 2 hours ago. He states he eating breakfast at [employer] when he noticed he was having slurred speech. He drove to [facility] and was unable to open the car door due to LUE weakness. He had no known symptoms upon waking up in the morning. No further associated symptoms or modifying factors. He denies any LLE weakness or visual disturbance.     PCP: Wende Neighbors, MD  Past Medical History:  Diagnosis Date  . Anemia   . Arthritis   . BPH (benign prostatic hyperplasia)   . Carotid artery occlusion   . COPD (chronic obstructive pulmonary disease) ([place])   . Coronary artery disease   . Diverticular disease 02/14/2011   problems swallowing  . Dizziness   . DVT (deep venous thrombosis) ([place])   . GERD (gastroesophageal reflux disease)   . Headache(784.0)   . History of blood transfusion   . Hypertension   . Hypothyroidism   .  Kidney stone   . Nephrolithiasis    lithotripsy 2006  . Non-ischemic cardiomyopathy ([place])    mild  EF on echo 05/03/2012 55 to 60%  . Pneumonia    hx of - hospitalized  . Shortness of breath    exertion  . Stroke ([place]) 03-11-13  . TIA (transient ischemic attack)     Patient Active Problem List   Diagnosis Date Noted  . CVA (cerebral vascular accident) ([place]) 06/11/2016  . Aftercare following surgery of the circulatory system, [place] 10/11/2013  . CVA (cerebral infarction) 02/23/2013  . Occlusion and stenosis of carotid artery with cerebral infarction 02/23/2013  . TIA (transient ischemic attack) 02/22/2013  . Right arm weakness 02/22/2013  . Hypertension 02/22/2013  . CAD (coronary artery disease) 02/22/2013  . Esophageal dysmotility 02/24/2011  . Esophageal web 02/24/2011  . Mouth pain 02/24/2011  . Acute renal failure ([place]) 02/16/2011  . Hypokalemia 02/13/2011  . Protein-calorie malnutrition, severe ([place]) 02/13/2011  . PNA (pneumonia) 02/10/2011  . Dysphagia 02/10/2011  . Anemia 02/10/2011  . Weakness generalized 02/10/2011  . S/P hip replacement right 02/10/2011    Past Surgical History:  Procedure Laterality Date  . CAROTID ENDARTERECTOMY Left 03-11-13   cea  . carpel tunnel Right   . ENDARTERECTOMY Left 03/11/2013   Procedure: ENDARTERECTOMY CAROTID with Vascu-Guard Bovine Patch.;  Surgeon: Conrad Schoolcraft, MD;  Location: [facility];  Service: Vascular;  Laterality: Left;  . JOINT REPLACEMENT Right    hip  .  JOINT REPLACEMENT Left    knee  . KNEE ARTHROSCOPY Right 2010  . REPLACEMENT TOTAL KNEE Left 2006   left knee  . TOTAL HIP ARTHROPLASTY Right august 6th 2012   right side   . TRANSURETHRAL RESECTION OF PROSTATE  1996       Home Medications    Prior to Admission medications   Medication Sig Start Date End Date Taking? Authorizing Provider  levothyroxine (SYNTHROID, LEVOTHROID) 88 MCG tablet Take 1 tablet by mouth daily. 05/29/16  Yes Historical Provider, MD    losartan-hydrochlorothiazide (HYZAAR) 50-12.5 MG per tablet Take 0.5 tablets by mouth every morning.   Yes Historical Provider, MD  acetaminophen (TYLENOL) 500 MG tablet Take 1,000 mg by mouth every 6 (six) hours as needed for pain.     Historical Provider, MD  aspirin EC 81 MG tablet Take 81 mg by mouth every morning.    Historical Provider, MD  Digestive Enzymes (DIGESTIVE ENZYME PO) Take 2 each by mouth at bedtime. Advantage Gummies Take 2 daily    Historical Provider, MD  docusate sodium (COLACE) 250 MG capsule Take 250 mg by mouth at bedtime as needed and may repeat dose one time if needed for constipation.    Historical Provider, MD  fish oil-omega-3 fatty acids 1000 MG capsule Take 300 mg by mouth every morning.     Historical Provider, MD  levofloxacin (LEVAQUIN) 500 MG tablet Take 1 tablet (500 mg total) by mouth daily. Patient not taking: Reported on 10/23/2015 02/09/15   Jola Schmidt, MD  levothyroxine (SYNTHROID, LEVOTHROID) 50 MCG tablet Take 75 mcg by mouth daily before breakfast.     Historical Provider, MD  Nutritional Supplements (ENSURE COMPLETE PO) Take 237 mLs by mouth. Take every other day    Historical Provider, MD  Psyllium 28.3 % POWD Take 1 scoop by mouth daily. Two tsp daily    Historical Provider, MD    Family History Family History  Problem Relation Age of Onset  . Stroke Father   . Heart attack Father   . Hypertension Mother   . Hypertension    . Diabetes      Social History Social History  Substance Use Topics  . Smoking status: Never Smoker  . Smokeless tobacco: Never Used  . Alcohol use No     Allergies   Sulfa antibiotics and Milk-related compounds   Review of Systems Review of Systems  Unable to perform ROS: Acuity of condition   Physical Exam Updated Vital Signs BP 157/96 (BP Location: Right Arm)   Pulse 70   Temp 98.1 F (36.7 C) (Oral)   Resp 18   Ht 5\' 11"  (1.803 m)   Wt 140 lb (63.5 kg)   SpO2 100%   BMI 19.53 kg/m   Physical  Exam  Constitutional: He appears well-developed and well-nourished. No distress.  HENT:  Head: Normocephalic and atraumatic.  Eyes: Conjunctivae and EOM are normal. Pupils are equal, round, and reactive to light. No scleral icterus.  Neck: Neck supple.  Cardiovascular: Normal rate and regular rhythm.   Pulmonary/Chest: Effort normal. No respiratory distress. He has no wheezes. He has no rales.  Abdominal: Soft. Bowel sounds are normal. There is no tenderness.  Musculoskeletal: Normal range of motion. He exhibits no edema.  Neurological: He is alert.  Normal BLE strength (before TPA also). Mild weakness on the LUE. Weakness in the hand that has improved.   Skin: Skin is warm and dry.  Psychiatric: He has a normal mood and  affect.  Nursing note and vitals reviewed. (Physical Exam was done following TPA.)   ED Treatments / Results  DIAGNOSTIC STUDIES: Oxygen Saturation is 100% on RA, normal by my interpretation.    COORDINATION OF CARE: 9:55 AM- Pt advised of plan for treatment and pt agrees.  Labs (all labs ordered are listed, but only abnormal results are displayed) Labs Reviewed  CBC - Abnormal; Notable for the following:       Result Value   Platelets 140 (*)    All other components within normal limits  COMPREHENSIVE METABOLIC PANEL - Abnormal; Notable for the following:    Glucose, Bld 102 (*)    BUN 22 (*)    ALT 15 (*)    GFR calc non Af Amer 52 (*)    GFR calc Af Amer 60 (*)    All other components within normal limits  I-STAT CHEM 8, ED - Abnormal; Notable for the following:    Chloride 100 (*)    BUN 22 (*)    Glucose, Bld 103 (*)    All other components within normal limits  ETHANOL  PROTIME-INR  APTT  DIFFERENTIAL  URINALYSIS, ROUTINE W REFLEX MICROSCOPIC  RAPID URINE DRUG SCREEN, HOSP PERFORMED  I-STAT TROPOININ, ED  CBG MONITORING, ED    EKG  EKG Interpretation  Date/Time:  Saturday June 11 2016 09:50:53 EST Ventricular Rate:  60 PR  Interval:    QRS Duration: 101 QT Interval:  457 QTC Calculation: 457 R Axis:   -37 Text Interpretation:  Sinus rhythm Prolonged PR interval Left axis deviation Anteroseptal infarct, old No significant change since last tracing Confirmed by Mays Paino  MD, Westlyn Glaza [phone]) on 06/11/2016 10:51:17 AM       Radiology Ct Head Code Stroke W/o Cm  Result Date: 06/11/2016 CLINICAL DATA:  Code stroke. Code stroke. New onset left upper extremity weakness beginning at 8 a.m. today. EXAM: CT HEAD WITHOUT CONTRAST TECHNIQUE: Contiguous axial images were obtained from the base of the skull through the vertex without intravenous contrast. COMPARISON:  MRI the brain 02/22/2013. FINDINGS: Brain: Moderate diffuse white matter changes are present bilaterally. No acute cortical infarct, hemorrhage, or mass lesion is present. Ventricles are proportionate to the degree of atrophy. The brainstem and cerebellum are normal. No significant extra-axial fluid collection is present. Vascular: Atherosclerotic calcifications are present within the cavernous internal carotid arteries bilaterally and at the dural margin of the left vertebral artery. There are no hyperdense vessels. Skull: The calvarium is intact. No focal lytic or blastic lesions are present. Sinuses/Orbits: The paranasal sinuses and mastoid air cells are clear. The globes and orbits are intact. ASPECTS [facility] Stroke Program Early CT Score) - Ganglionic level infarction (caudate, lentiform nuclei, internal capsule, insula, M1-M3 cortex): 7/7 - Supraganglionic infarction (M4-M6 cortex): 3/3 Total score (0-10 with 10 being normal): 10/10 IMPRESSION: 1. No acute intracranial abnormality 2. ASPECTS is 10/10 3. Stable moderate atrophy and white matter disease, somewhat advanced for age. These results were called by telephone at the time of interpretation on 06/11/2016 at 10:07 am to Dr. Fredia Nixon , who verbally acknowledged these results. Electronically Signed   By:  San Morelle M.D.   On: 06/11/2016 10:10    Procedures Procedures (including critical care time)  CRITICAL CARE Performed by: Jorge Nixon Total critical care time: 30  minutes Critical care time was exclusive of separately billable procedures and treating other patients. Critical care was necessary to treat or prevent imminent or life-threatening deterioration. Critical care was time  spent personally by me on the following activities: development of treatment plan with patient and/or surrogate as well as nursing, discussions with consultants, evaluation of patient's response to treatment, examination of patient, obtaining history from patient or surrogate, ordering and performing treatments and interventions, ordering and review of laboratory studies, ordering and review of radiographic studies, pulse oximetry and re-evaluation of patient's condition.  Patient with some improvement in the left hand and left arm weakness following initiation of TPA. Discussed with the neural hospitalist a [facility] patient accepted to ICU and her neuro bed there. Patient's temporary admit orders and transfer documents completed.  Medications Ordered in ED Medications  alteplase (ACTIVASE) 1 mg/mL infusion 57 mg (0 mg Intravenous Stopped 06/11/16 1135)    Followed by  0.9 %  sodium chloride infusion (50 mLs Intravenous New Bag/Given 06/11/16 1135)     Initial Impression / Assessment and Plan / ED Course  I have reviewed the triage vital signs and the nursing notes.  Pertinent labs & imaging results that were available during my care of the patient were reviewed by me and considered in my medical decision making (see chart for details).  Clinical Course    Patient presenting with acute onset of speech problems and left upper extremity weakness 8:00 in the morning. Upon arrival here speech had improved but left arm was still weak. Patient's NIH score was 4. Neurology consult to be a telemetry  recommended TPA. TPA given. Patient understood risk. Patient's INR was normal. Patient without any risk factors other than a slight one for age for not receiving TPA.  As stated above patient showed improvement on TPA. Patient will be transported to the neuro ICU bed via [facility].  (Physical Exam was done following TPA.)  Final Clinical Impressions(s) / ED Diagnoses   Final diagnoses:  Cerebrovascular accident (CVA), unspecified mechanism ([place])    New Prescriptions New Prescriptions   No medications on file   I personally performed the services described in this documentation, which was scribed in my presence. The recorded information has been reviewed and is accurate.       Jorge Sorrow, MD 06/11/16 1239

## 2016-06-12 ENCOUNTER — Inpatient Hospital Stay (HOSPITAL_COMMUNITY): Payer: Medicare Other

## 2016-06-12 ENCOUNTER — Other Ambulatory Visit (HOSPITAL_COMMUNITY): Payer: 59

## 2016-06-12 ENCOUNTER — Inpatient Hospital Stay (HOSPITAL_COMMUNITY): Payer: 59

## 2016-06-12 DIAGNOSIS — E785 Hyperlipidemia, unspecified: Secondary | ICD-10-CM

## 2016-06-12 DIAGNOSIS — I63411 Cerebral infarction due to embolism of right middle cerebral artery: Secondary | ICD-10-CM

## 2016-06-12 DIAGNOSIS — Z9889 Other specified postprocedural states: Secondary | ICD-10-CM

## 2016-06-12 DIAGNOSIS — Z8673 Personal history of transient ischemic attack (TIA), and cerebral infarction without residual deficits: Secondary | ICD-10-CM

## 2016-06-12 LAB — LIPID PANEL
Cholesterol: 167 mg/dL (ref 0–200)
HDL: 29 mg/dL — ABNORMAL LOW (ref >40)
LDL Cholesterol: 117 mg/dL — ABNORMAL HIGH (ref 0–99)
Total CHOL/HDL Ratio: 5.8 RATIO
Triglycerides: 105 mg/dL (ref <150)
VLDL: 21 mg/dL (ref 0–40)

## 2016-06-12 MED ORDER — ATORVASTATIN CALCIUM 10 MG PO TABS
20.0000 mg | ORAL_TABLET | Freq: Every day | ORAL | Status: DC
  Administered 2016-06-12 – 2016-06-13 (×2): 20 mg via ORAL
  Filled 2016-06-12 (×2): qty 2

## 2016-06-12 MED ORDER — ENOXAPARIN SODIUM 40 MG/0.4ML ~~LOC~~ SOLN
40.0000 mg | SUBCUTANEOUS | Status: DC
  Administered 2016-06-12 – 2016-06-13 (×2): 40 mg via SUBCUTANEOUS
  Filled 2016-06-12 (×2): qty 0.4

## 2016-06-12 MED ORDER — CLOPIDOGREL BISULFATE 75 MG PO TABS
75.0000 mg | ORAL_TABLET | Freq: Every day | ORAL | Status: DC
  Administered 2016-06-12 – 2016-06-14 (×3): 75 mg via ORAL
  Filled 2016-06-12 (×3): qty 1

## 2016-06-12 MED ORDER — PANTOPRAZOLE SODIUM 40 MG PO TBEC
40.0000 mg | DELAYED_RELEASE_TABLET | Freq: Every day | ORAL | Status: DC
  Administered 2016-06-12 – 2016-06-14 (×3): 40 mg via ORAL
  Filled 2016-06-12 (×3): qty 1

## 2016-06-12 NOTE — Progress Notes (Signed)
OT Cancellation Note  Patient Details Name: SUMEET BRINKS MRN: [id number] DOB: Oct 02, 1924   Cancelled Treatment:    Reason Eval/Treat Not Completed: Patient at procedure or test/ unavailable (MRI). Will follow up for OT eval as time allows.  Binnie Kand  M.S., OTR/L Pager: [phone]  06/12/2016, 9:38 AM

## 2016-06-12 NOTE — Evaluation (Signed)
Physical Therapy Evaluation Patient Details Name: Jorge Nixon MRN: [id number] DOB: 04-29-1925 Today's Date: 06/12/2016   History of Present Illness  83 y.o. male with acute onset of left arm weakness and numbness. tPA administered. MRI showed acute infarct in posterior R frontal lobe. PMHx: COPD, CAD, GERD, HTN, Non-ischemic cardiomyopathy, CVA, TIA.   Clinical Impression  Patient demonstrates deficits in functional mobility as indicated below. Will benefit from continued skilled PT to address deficits and maximize function. Will see as indicated and progress as tolerated. Recommend HHPT and supervision upon initial discharge.    Follow Up Recommendations Home health PT;Supervision/Assistance - 24 hour    Equipment Recommendations  Rolling walker with 5" wheels    Recommendations for Other Services       Precautions / Restrictions Precautions Precautions: Fall Restrictions Weight Bearing Restrictions: No      Mobility  Bed Mobility Overal bed mobility: Needs Assistance Bed Mobility: Supine to Sit     Supine to sit: Supervision     General bed mobility comments: increased time to perform, no physical assist required, supervision for safety  Transfers Overall transfer level: Needs assistance Equipment used: Rolling walker (2 wheeled) Transfers: Sit to/from Stand Sit to Stand: Min guard         General transfer comment: VCs for hand placement and positioning with RW when attempting to sit  Ambulation/Gait Ambulation/Gait assistance: Min guard Ambulation Distance (Feet): 180 Feet Assistive device: Rolling walker (2 wheeled) Gait Pattern/deviations: Step-through pattern;Decreased stride length;Trunk flexed;Narrow base of support Gait velocity: decreased Gait velocity interpretation: Below normal speed for age/gender General Gait Details: some instability noted. min guard for safety. No overt LOB at this time  Tree surgeon Rankin (Stroke Patients Only) Modified Rankin (Stroke Patients Only) Pre-Morbid Rankin Score: No symptoms Modified Rankin: Moderately severe disability     Balance Overall balance assessment: Needs assistance   Sitting balance-Leahy Scale: Good       Standing balance-Leahy Scale: Fair Standing balance comment: reliance on UE support for stability but able to static stand without device, increased sway noted                             Pertinent Vitals/Pain Pain Assessment: No/denies pain    Home Living Family/patient expects to be discharged to:: Private residence Living Arrangements: Spouse/significant other Available Help at Discharge: Family Type of Home: House Home Access: Stairs to enter Entrance Stairs-Rails: Psychiatric nurse of Steps: 4 Home Layout: One level Home Equipment: Cane - quad      Prior Function Level of Independence: Independent with assistive device(s)         Comments: Quad cane for mobility. Drives. Independent with ADL.     Hand Dominance   Dominant Hand: Right    Extremity/Trunk Assessment        Lower Extremity Assessment Lower Extremity Assessment: Overall WFL for tasks assessed       Communication   Communication: No difficulties  Cognition Arousal/Alertness: Awake/alert Behavior During Therapy: WFL for tasks assessed/performed Overall Cognitive Status: Within Functional Limits for tasks assessed                      General Comments      Exercises     Assessment/Plan    PT Assessment Patient needs continued PT services  PT Problem List Decreased  activity tolerance;Decreased balance;Decreased mobility          PT Treatment Interventions DME instruction;Gait training;Stair training;Functional mobility training;Therapeutic activities;Therapeutic exercise;Balance training;Patient/family education    PT Goals (Current goals can be found in the Care Plan section)   Acute Rehab PT Goals Patient Stated Goal: to go home PT Goal Formulation: With patient Time For Goal Achievement: 06/26/16 Potential to Achieve Goals: Good    Frequency Min 4X/week   Barriers to discharge        Co-evaluation               End of Session Equipment Utilized During Treatment: Gait belt Activity Tolerance: Patient tolerated treatment well Patient left: in chair;with call bell/phone within reach Nurse Communication: Mobility status         Time: 1430-1443 PT Time Calculation (min) (ACUTE ONLY): 13 min   Charges:   PT Evaluation $PT Eval Moderate Complexity: 1 Procedure     PT G Codes:        Duncan Dull 06-Jul-2016, 2:47 PM Alben Deeds, [place] DPT  [phone]

## 2016-06-12 NOTE — Progress Notes (Signed)
STROKE TEAM PROGRESS NOTE   HISTORY OF PRESENT ILLNESS (per record) Jorge Nixon is a 83 y.o. male right hand dominant presented to [facility] with acute onset of left arm weakness and numbness around 8AM. He reports he was driving to [place] when this happened and he continued to drive. He then decided to come to ER because it wasn't getting better and he was concerned about a stroke. He reports a stroke 2 years ago which caused right sided weakness but now back to normal. He had to get carotid endarterectomy after that stroke. He took aspirin for 2 weeks but then reduced the dose to 81mg  daily. He reports taking 81mg  aspirin every day and last dose was this morning. His NIHSS was the OSH was 4 and teleneurology recommended tPA which was administered at 1045AM. He reports his symptoms are improving but he is still having difficulty closing his left arm.   LKW: 8AM tpa given?: Yes NIHSS: 1 (possible left arm dysmetria. Has difficulty left thumb and index finger closing) Premorbid modified rankin scale: 0   SUBJECTIVE (INTERVAL HISTORY) No family is at the bedside.  Overall he feels his condition is completely resolved. He still has left index and middle finger difficulty with flexion but he said it may not chronic for him. Other than that, he is back to baseline. MRI showed right motor strip small cortical infarct. CT no bleeding 24h after tPA.    OBJECTIVE Temp:  [97.5 F (36.4 C)-98.4 F (36.9 C)] 97.5 F (36.4 C) (01/14 0752) Pulse Rate:  [52-74] 63 (01/14 0900) Cardiac Rhythm: Normal sinus rhythm (01/14 0800) Resp:  [11-26] 15 (01/14 0900) BP: (123-192)/(51-146) 149/65 (01/14 0900) SpO2:  [96 %-100 %] 98 % (01/14 0900)  CBC:   Recent Labs Lab 06/11/16 0950 06/11/16 0956  WBC 5.0  --   NEUTROABS 3.4  --   HGB 13.2 13.6  HCT 39.7 40.0  MCV 89.0  --   PLT 140*  --     Basic Metabolic Panel:   Recent Labs Lab 06/11/16 0950 06/11/16 0956  NA 140 142  K 3.6 3.8   CL 102 100*  CO2 29  --   GLUCOSE 102* 103*  BUN 22* 22*  CREATININE 1.20 1.20  CALCIUM 9.4  --     Lipid Panel:     Component Value Date/Time   CHOL 167 06/12/2016 0322   TRIG 105 06/12/2016 0322   HDL 29 (L) 06/12/2016 0322   CHOLHDL 5.8 06/12/2016 0322   VLDL 21 06/12/2016 0322   LDLCALC 117 (H) 06/12/2016 0322   HgbA1c:  Lab Results  Component Value Date   HGBA1C 5.7 (H) 02/24/2013   Urine Drug Screen:     Component Value Date/Time   LABOPIA NONE DETECTED 06/11/2016 1125   COCAINSCRNUR NONE DETECTED 06/11/2016 1125   LABBENZ NONE DETECTED 06/11/2016 1125   AMPHETMU NONE DETECTED 06/11/2016 1125   THCU NONE DETECTED 06/11/2016 1125   LABBARB NONE DETECTED 06/11/2016 1125      IMAGING I have personally reviewed the radiological images below and agree with the radiology interpretations.  Ct Head Code Stroke W/o Cm 06/11/2016 1. No acute intracranial abnormality  2. ASPECTS is 10/10  3. Stable moderate atrophy and white matter disease, somewhat advanced for age.    MRI / MRA Head - pending  CUS pending  Repeat Head CT Wo Contrast - pending  TEE pending  LE venous doppler pending   PHYSICAL EXAM  Temp:  [  97.5 F (36.4 C)-98.4 F (36.9 C)] 97.5 F (36.4 C) (01/14 0752) Pulse Rate:  [52-74] 63 (01/14 0900) Resp:  [11-26] 15 (01/14 0900) BP: (123-192)/(57-146) 149/65 (01/14 0900) SpO2:  [96 %-100 %] 98 % (01/14 0900)  General - Well nourished, well developed, in no apparent distress.  Ophthalmologic - SFundi not visualized due to noncooperation.  Cardiovascular - Regular rate and rhythm.  Mental Status -  Level of arousal and orientation to time, place, and person were intact. Language including expression, naming, repetition, comprehension was assessed and found intact. Fund of Knowledge was assessed and was impaired.  Cranial Nerves II - XII - II - Visual field intact OU. III, IV, VI - Extraocular movements intact. V - Facial sensation  intact bilaterally. VII - Facial movement intact bilaterally. VIII - Hearing & vestibular intact bilaterally. X - Palate elevates symmetrically. XI - Chin turning & shoulder shrug intact bilaterally. XII - Tongue protrusion intact.  Motor Strength - The patient's strength was normal in all extremities except left index and middle finger not able flex and pronator drift was absent.  Bulk was normal and fasciculations were absent.   Motor Tone - Muscle tone was assessed at the neck and appendages and was normal.  Reflexes - The patient's reflexes were 1+ in all extremities and he had no pathological reflexes.  Sensory - Light touch, temperature/pinprick were assessed and were symmetrical.    Coordination - The patient had normal movements in the hands with no ataxia or dysmetria.  Tremor was absent.  Gait and Station - deferred.   ASSESSMENT/PLAN [name] is a 83 y.o. male with history of a previous stroke 2 years ago, TIAs, S/P carotid endarterectomy, nonischemic cardiomyopathy, hypertension, DVT history, coronary artery disease, and COPD, presenting with left-sided weakness. He  received IV [facility]-PA at [facility] on 06/11/2016 at 1045 AM.  Stroke:  Right motor strip small cortical infarct, embolic pattern, etiology not clear.   Resultant  Left index finger and middle finger weakness  MRI - pending  MRA - pending  Repeat CT Head - pending  Carotid Doppler - pending  2D Echo - pending  LE venous doppler - pending  May consider TEE and loop recorder if above work up negative.   LDL - 117  HgbA1c - pending  VTE prophylaxis - SCDs Diet Heart Room service appropriate? Yes; Fluid consistency: Thin  aspirin 81 mg daily prior to admission, now on plavix 24h after tPA.   Patient counseled to be compliant with his antithrombotic medications  Ongoing aggressive stroke risk factor management  Therapy recommendations: pending  Disposition: Pending  Carotid stenosis s/p left CEA  in 2014  Left MCA infarct 01/2013  CUS left ICA 50-69% stenosis  Cerebral angio left ICA 75% stenosis  Had left CEA in 02/2013   Last follow up with [facility] in 09/2015 CUS showed wide patent left ICA and no right ICA stenosis  Hypertension  Stable  Permissive hypertension (OK if < 220/120) but gradually normalize in 5-7 days  Long-term BP goal normotensive  Hyperlipidemia  Home meds:  No lipid lowering medications prior to admission.  LDL 117, goal < 70  Add Lipitor 20 mg daily  Continue statin at discharge   Other Stroke Risk Factors  Advanced age  Hx stroke in 01/2013 with left MCA infarct  Family hx stroke (father)  Coronary artery disease  Hx of DVT  Other Active Problems    Hospital day # 1  This patient is  critically ill due to stroke s/p tPA, carotid stenosis s/p left CEA, CAD and at significant risk of neurological worsening, death form recurrent stroke, hemorrhagic transformation, heart failure. This patient's care requires constant monitoring of vital signs, hemodynamics, respiratory and cardiac monitoring, review of multiple databases, neurological assessment, discussion with family, other specialists and medical decision making of high complexity. I spent 35 minutes of neurocritical care time in the care of this patient.  Rosalin Hawking, MD PhD Stroke Neurology 06/12/2016 11:54 AM     To contact Stroke Continuity provider, please refer to [email or web address] After hours, contact General Neurology

## 2016-06-12 NOTE — Progress Notes (Signed)
PT Cancellation Note  Patient Details Name: NOUMAN MALAGON MRN: [id number] DOB: November 29, 1924   Cancelled Treatment:    Reason Eval/Treat Not Completed: Patient at procedure or test/unavailable   Duncan Dull 06/12/2016, 9:38 AM

## 2016-06-12 NOTE — Progress Notes (Signed)
Paged Dr. Cheral Marker regarding patient report of new onset headache. PT NIH remains stable at zero. Tylenol given per MAR. Advised to page MD again if changes in NIH or neuro exam.

## 2016-06-12 NOTE — Evaluation (Signed)
Occupational Therapy Evaluation Patient Details Name: Jorge Nixon MRN: [id number] DOB: [birth year] Today's Date: 06/12/2016    History of Present Illness 83 y.o. male with acute onset of left arm weakness and numbness. tPA administered. MRI showed acute infarct in posterior R frontal lobe. PMHx: COPD, CAD, GERD, HTN, Non-ischemic cardiomyopathy, CVA, TIA.    Clinical Impression   Pt reports he was independent with ADL PTA. Currently pt overall min guard for ADL and functional mobility with the exception of min assist for LB dressing. Pt planning to d/c home with supervision from family. Pt would benefit from continued skilled OT to address established goals.    Follow Up Recommendations  No OT follow up;Supervision/Assistance - 24 hour    Equipment Recommendations  Tub/shower seat    Recommendations for Other Services       Precautions / Restrictions Precautions Precautions: Fall Restrictions Weight Bearing Restrictions: No      Mobility Bed Mobility Overal bed mobility: Needs Assistance Bed Mobility: Supine to Sit     Supine to sit: Supervision     General bed mobility comments: increased time to perform, no physical assist required, supervision for safety  Transfers Overall transfer level: Needs assistance Equipment used: Rolling walker (2 wheeled) Transfers: Sit to/from Stand Sit to Stand: Min guard         General transfer comment: VCs for hand placement and positioning with RW when attempting to sit    Balance Overall balance assessment: Needs assistance   Sitting balance-Leahy Scale: Good       Standing balance-Leahy Scale: Fair Standing balance comment: reliance on UE support for stability but able to static stand without device, increased sway noted                            ADL Overall ADL's : Needs assistance/impaired Eating/Feeding: Set up;Sitting   Grooming: Min guard;Standing   Upper Body Bathing: Set up;Supervision/  safety;Sitting   Lower Body Bathing: Min guard;Sit to/from stand   Upper Body Dressing : Set up;Supervision/safety;Sitting   Lower Body Dressing: Minimal assistance;Sit to/from stand   Toilet Transfer: Min guard;Ambulation;RW Toilet Transfer Details (indicate cue type and reason): Simulated         Functional mobility during ADLs: Min guard;Rolling walker       Vision Vision Assessment?: No apparent visual deficits   Perception     Praxis      Pertinent Vitals/Pain Pain Assessment: No/denies pain     Hand Dominance Right   Extremity/Trunk Assessment Upper Extremity Assessment Upper Extremity Assessment: Overall WFL for tasks assessed   Lower Extremity Assessment Lower Extremity Assessment: Defer to PT evaluation   Cervical / Trunk Assessment Cervical / Trunk Assessment: Kyphotic   Communication Communication Communication: No difficulties   Cognition Arousal/Alertness: Awake/alert Behavior During Therapy: WFL for tasks assessed/performed Overall Cognitive Status: Within Functional Limits for tasks assessed                     General Comments       Exercises       Shoulder Instructions      Home Living Family/patient expects to be discharged to:: Private residence Living Arrangements: Spouse/significant other Available Help at Discharge: Family Type of Home: House Home Access: Stairs to enter Technical brewer of Steps: 4 Entrance Stairs-Rails: Right;Left Home Layout: One level     Bathroom Shower/Tub: Walk-in Hydrologist: Standard  Home Equipment: Cane - quad          Prior Functioning/Environment Level of Independence: Independent with assistive device(s)        Comments: Quad cane for mobility. Drives. Independent with ADL.        OT Problem List: Impaired balance (sitting and/or standing);Decreased knowledge of use of DME or AE   OT Treatment/Interventions: Self-care/ADL training;DME  and/or AE instruction;Therapeutic activities;Patient/family education;Balance training    OT Goals(Current goals can be found in the care plan section) Acute Rehab OT Goals Patient Stated Goal: to go home OT Goal Formulation: With patient Time For Goal Achievement: 06/26/16 Potential to Achieve Goals: Good ADL Goals Pt Will Perform Grooming: with modified independence;standing Pt Will Perform Lower Body Bathing: with modified independence;sit to/from stand Pt Will Perform Lower Body Dressing: with modified independence;sit to/from stand Pt Will Transfer to Toilet: with modified independence;ambulating;regular height toilet Pt Will Perform Toileting - Clothing Manipulation and hygiene: with modified independence;sit to/from stand Pt Will Perform Tub/Shower Transfer: Shower transfer;with modified independence;ambulating;shower seat;rolling walker  OT Frequency: Min 2X/week   Barriers to D/C:            Co-evaluation              End of Session Equipment Utilized During Treatment: Gait belt;Rolling walker Nurse Communication: Mobility status  Activity Tolerance: Patient tolerated treatment well Patient left: in chair;with call bell/phone within reach   Time: 1418-1430 OT Time Calculation (min): 12 min Charges:  OT General Charges $OT Visit: 1 Procedure OT Evaluation $OT Eval Moderate Complexity: 1 Procedure G-Codes:     Binnie Kand M.S., OTR/L Pager: [phone]  06/12/2016, 3:42 PM

## 2016-06-12 NOTE — Progress Notes (Signed)
Patient has arrived as a transfer to 20c06.Denies pain/discomfort. Will continue to monitor.

## 2016-06-13 ENCOUNTER — Inpatient Hospital Stay (HOSPITAL_COMMUNITY): Payer: Medicare Other

## 2016-06-13 ENCOUNTER — Encounter (HOSPITAL_COMMUNITY)
Admission: EM | Disposition: A | Discharge status: Home Health | Payer: Self-pay | Source: Home / Self Care | Attending: Neurology

## 2016-06-13 ENCOUNTER — Encounter (HOSPITAL_COMMUNITY): Payer: Self-pay | Admitting: *Deleted

## 2016-06-13 DIAGNOSIS — I63411 Cerebral infarction due to embolism of right middle cerebral artery: Secondary | ICD-10-CM

## 2016-06-13 DIAGNOSIS — I63012 Cerebral infarction due to thrombosis of left vertebral artery: Secondary | ICD-10-CM

## 2016-06-13 DIAGNOSIS — I639 Cerebral infarction, unspecified: Secondary | ICD-10-CM

## 2016-06-13 DIAGNOSIS — I6789 Other cerebrovascular disease: Secondary | ICD-10-CM

## 2016-06-13 LAB — HEMOGLOBIN A1C
HEMOGLOBIN A1C: 5.4 % (ref 4.8–5.6)
MEAN PLASMA GLUCOSE: 108 mg/dL

## 2016-06-13 LAB — ECHOCARDIOGRAM COMPLETE
HEIGHTINCHES: 71 in
WEIGHTICAEL: 2240 [oz_av]

## 2016-06-13 SURGERY — INVASIVE LAB ABORTED CASE
Site: Throat

## 2016-06-13 MED ORDER — BUTAMBEN-TETRACAINE-BENZOCAINE 2-2-14 % EX AERO
INHALATION_SPRAY | CUTANEOUS | Status: DC | PRN
  Administered 2016-06-13: 2 via TOPICAL

## 2016-06-13 MED ORDER — PERFLUTREN LIPID MICROSPHERE
1.0000 mL | INTRAVENOUS | Status: AC | PRN
  Administered 2016-06-13: 2 mL via INTRAVENOUS
  Filled 2016-06-13: qty 10

## 2016-06-13 MED ORDER — MIDAZOLAM HCL 5 MG/ML IJ SOLN
INTRAMUSCULAR | Status: AC
  Filled 2016-06-13: qty 2

## 2016-06-13 MED ORDER — MIDAZOLAM HCL 10 MG/2ML IJ SOLN
INTRAMUSCULAR | Status: DC | PRN
  Administered 2016-06-13: 1 mg via INTRAVENOUS
  Administered 2016-06-13: 2 mg via INTRAVENOUS

## 2016-06-13 MED ORDER — SODIUM CHLORIDE 0.9 % IV SOLN: INTRAVENOUS | Status: DC

## 2016-06-13 MED ORDER — FENTANYL CITRATE (PF) 100 MCG/2ML IJ SOLN
INTRAMUSCULAR | Status: AC
  Filled 2016-06-13: qty 2

## 2016-06-13 MED ORDER — SODIUM CHLORIDE 0.9 % IV SOLN
INTRAVENOUS | Status: DC
  Administered 2016-06-13: 15:00:00 via INTRAVENOUS

## 2016-06-13 NOTE — Progress Notes (Signed)
Patient returned from Endo for TEE at this time. Alert and in stable condition.

## 2016-06-13 NOTE — Progress Notes (Signed)
STROKE TEAM PROGRESS NOTE   HISTORY OF PRESENT ILLNESS (per record) Jorge Nixon is a 83 y.o. male right hand dominant presented to [facility] with acute onset of left arm weakness and numbness around 8AM. He reports he was driving to [place] when this happened and he continued to drive. He then decided to come to ER because it wasn't getting better and he was concerned about a stroke. He reports a stroke 2 years ago which caused right sided weakness but now back to normal. He had to get carotid endarterectomy after that stroke. He took aspirin for 2 weeks but then reduced the dose to 81mg  daily. He reports taking 81mg  aspirin every day and last dose was this morning. His NIHSS was the OSH was 4 and teleneurology recommended tPA which was administered at 1045AM. He reports his symptoms are improving but he is still having difficulty closing his left arm.   LKW: 8AM tpa given?: Yes NIHSS: 1 (possible left arm dysmetria. Has difficulty left thumb and index finger closing) Premorbid modified rankin scale: 0   SUBJECTIVE (INTERVAL HISTORY) No family is at the bedside.  Overall he feels his condition is completely resolved. He still has left index and middle finger difficulty with flexion but he said it may not chronic for him. Other than that, he is back to baseline. MRI showed right motor strip small cortical infarct. CT no bleeding 24h after tPA.    OBJECTIVE Temp:  [97.6 F (36.4 C)-99.7 F (37.6 C)] 99.7 F (37.6 C) (01/15 1740) Pulse Rate:  [55-190] 75 (01/15 1740) Cardiac Rhythm: Heart block (01/15 0700) Resp:  [12-21] 18 (01/15 1740) BP: (128-211)/(59-123) 165/59 (01/15 1740) SpO2:  [80 %-100 %] 98 % (01/15 1740)  CBC:   Recent Labs Lab 06/11/16 0950 06/11/16 0956  WBC 5.0  --   NEUTROABS 3.4  --   HGB 13.2 13.6  HCT 39.7 40.0  MCV 89.0  --   PLT 140*  --     Basic Metabolic Panel:   Recent Labs Lab 06/11/16 0950 06/11/16 0956  NA 140 142  K 3.6 3.8  CL 102  100*  CO2 29  --   GLUCOSE 102* 103*  BUN 22* 22*  CREATININE 1.20 1.20  CALCIUM 9.4  --     Lipid Panel:     Component Value Date/Time   CHOL 167 06/12/2016 0322   TRIG 105 06/12/2016 0322   HDL 29 (L) 06/12/2016 0322   CHOLHDL 5.8 06/12/2016 0322   VLDL 21 06/12/2016 0322   LDLCALC 117 (H) 06/12/2016 0322   HgbA1c:  Lab Results  Component Value Date   HGBA1C 5.4 06/12/2016   Urine Drug Screen:     Component Value Date/Time   LABOPIA NONE DETECTED 06/11/2016 1125   COCAINSCRNUR NONE DETECTED 06/11/2016 1125   LABBENZ NONE DETECTED 06/11/2016 1125   AMPHETMU NONE DETECTED 06/11/2016 1125   THCU NONE DETECTED 06/11/2016 1125   LABBARB NONE DETECTED 06/11/2016 1125      IMAGING I have personally reviewed the radiological images below and agree with the radiology interpretations.  Ct Head Code Stroke W/o Cm 06/11/2016 1. No acute intracranial abnormality  2. ASPECTS is 10/10  3. Stable moderate atrophy and white matter disease, somewhat advanced for age.   Ct Head Wo Contrast 06/12/2016 IMPRESSION: 1. Acute nonhemorrhagic cortical infarct within the right precentral gyrus. 2. No additional infarcts. 3. Chronic atrophy and white matter disease is otherwise stable. 4. Atherosclerosis.   Mri  and Mra head Wo Contrast 06/12/2016 IMPRESSION: 1. Acute nonhemorrhagic cortical infarct in the posterior right frontal lobe along the pre frontal gyrus. 2. Additional cortical infarcts slightly more anterior in the right frontal lobe. 3. Moderate diffuse periventricular and subcortical white matter disease bilaterally suggesting extensive small vessel disease. 4. MRA circle of Willis is moderately degraded by patient motion but suggests diffuse small vessel disease. 5. Mild-to-moderate narrowing of the distal left M1 segment and proximal right PICA. 6. Moderate narrowing of the proximal right P2 segment. 7. Mild atherosclerotic narrowing of the proximal internal carotid artery  bilaterally in the neck without focal stenosis.    CUS  Right 40-59% ICA stenosis. Left 1-39% ICA stenosis. Antegrade vertebral flow.   TEE  not successful   LE venous doppler  No evidence of DVT or baker's cyst.  TTE  Left ventricle: The cavity size was normal. Wall thickness was   increased in a pattern of mild LVH. Systolic function was normal.   The estimated ejection fraction was in the range of 50% to 55%.   Left ventricular diastolic function parameters were normal. - Aortic valve: There was mild regurgitation. - Mitral valve: Calcified annulus. Mildly thickened leaflets .   There was mild regurgitation.   PHYSICAL EXAM  Temp:  [97.6 F (36.4 C)-99.7 F (37.6 C)] 99.7 F (37.6 C) (01/15 1740) Pulse Rate:  [55-190] 75 (01/15 1740) Resp:  [12-21] 18 (01/15 1740) BP: (128-211)/(59-123) 165/59 (01/15 1740) SpO2:  [80 %-100 %] 98 % (01/15 1740)  General - Well nourished, well developed, in no apparent distress.  Ophthalmologic - SFundi not visualized due to noncooperation.  Cardiovascular - Regular rate and rhythm.  Mental Status -  Level of arousal and orientation to time, place, and person were intact. Language including expression, naming, repetition, comprehension was assessed and found intact. Fund of Knowledge was assessed and was impaired.  Cranial Nerves II - XII - II - Visual field intact OU. III, IV, VI - Extraocular movements intact. V - Facial sensation intact bilaterally. VII - Facial movement intact bilaterally. VIII - Hearing & vestibular intact bilaterally. X - Palate elevates symmetrically. XI - Chin turning & shoulder shrug intact bilaterally. XII - Tongue protrusion intact.  Motor Strength - The patient's strength was normal in all extremities except left index and middle finger weak flexion and pronator drift was absent.  Bulk was normal and fasciculations were absent.   Motor Tone - Muscle tone was assessed at the neck and appendages and  was normal.  Reflexes - The patient's reflexes were 1+ in all extremities and he had no pathological reflexes.  Sensory - Light touch, temperature/pinprick were assessed and were symmetrical.    Coordination - The patient had normal movements in the hands with no ataxia or dysmetria.  Tremor was absent.  Gait and Station - deferred.   ASSESSMENT/PLAN [name] is a 83 y.o. male with history of a previous stroke 2 years ago, TIAs, S/P carotid endarterectomy, nonischemic cardiomyopathy, hypertension, DVT history, coronary artery disease, and COPD, presenting with left-sided weakness. He  received IV t-PA at [facility] on 06/11/2016 at 1045 AM.  Stroke:  Right motor strip small cortical infarct, embolic pattern, etiology not clear.   Resultant  Left index finger and middle finger weakness  MRI - right frontal punctate infarcts  MRA head and neck- diffuse atherosclerosis, no focal high-grade stenosis  Repeat CT Head - no hemorrhage post TPA  Carotid Doppler - right ICA 40-59% stenosis, left  ICA patent  2D Echo - EF 50-55%  LE venous doppler - no DVT  Consider loop recorder to rule out A. fib.   LDL - 117  HgbA1c -5.4   VTE prophylaxis - SCDs Diet Heart Room service appropriate? Yes; Fluid consistency: Thin  aspirin 81 mg daily prior to admission, now on plavix. Continue Plavix on discharge   Patient counseled to be compliant with his antithrombotic medications  Ongoing aggressive stroke risk factor management  Therapy recommendations: pending  Disposition: Pending  Carotid stenosis s/p left CEA in 2014  Left MCA infarct 01/2013  CUS left ICA 50-69% stenosis  Cerebral angio left ICA 75% stenosis  Had left CEA in 02/2013   Last follow up with [facility] in 09/2015 CUS showed wide patent left ICA and no right ICA stenosis  CUS 06/14/15 - right ICA 40-59% stenosis, left ICA patent  However, MRA neck showed mild atherosclerotic narrowing of the proximal internal carotid  artery bilaterally in the neck without focal stenosis.   Hypertension  Stable  Permissive hypertension (OK if < 220/120) but gradually normalize in 5-7 days  Long-term BP goal normotensive  Hyperlipidemia  Home meds:  No lipid lowering medications prior to admission.  LDL 117, goal < 70  Add Lipitor 20 mg daily  Continue statin at discharge  Other Stroke Risk Factors  Advanced age  Hx stroke in 01/2013 with left MCA infarct  Family hx stroke (father)  Coronary artery disease  Hx of DVT  Other Active Problems    Hospital day # 2  Rosalin Hawking, MD PhD Stroke Neurology 06/13/2016 6:15 PM     To contact Stroke Continuity provider, please refer to [email or web address] After hours, contact General Neurology

## 2016-06-13 NOTE — Progress Notes (Signed)
Occupational Therapy Treatment Patient Details Name: Jorge Nixon MRN: [id number] DOB: 1924-06-24 Today's Date: 06/13/2016    History of present illness 83 y.o. male with acute onset of left arm weakness and numbness. tPA administered. MRI showed acute infarct in posterior R frontal lobe. PMHx: COPD, CAD, GERD, HTN, Non-ischemic cardiomyopathy, CVA, TIA.    OT comments  Pt progressing toward OT goals. On entry into room, pt attempting to get up without assistance and nurse completing education concerning need for assistance. Pt impulsive with decreased safety awareness and problem solving abilities noted this session. Pt able to complete toilet transfer and toileting hygeine with min assist for safety this session. Pt with increased fatigue this afternoon as compared to evaluation but reports that he has had a busy day with procedures and is worn out. Pt will need to progress to modified independent level for ADL in order to D/C home safely. OT will continue to follow acutely. D/C plan remains appropriate at this time but will continue to update as needed based on progress.   Follow Up Recommendations  No OT follow up;Supervision/Assistance - 24 hour    Equipment Recommendations  Tub/shower seat    Recommendations for Other Services      Precautions / Restrictions Precautions Precautions: Fall Restrictions Weight Bearing Restrictions: No       Mobility Bed Mobility Overal bed mobility: Needs Assistance Bed Mobility: Sit to Supine       Sit to supine: Min guard   General bed mobility comments: Increased time and min guard assist for safety.  Transfers Overall transfer level: Needs assistance Equipment used: Rolling walker (2 wheeled) Transfers: Sit to/from Stand Sit to Stand: Min guard         General transfer comment: VCs for hand placement and positioning with RW when attempting to sit    Balance Overall balance assessment: Needs assistance Sitting-balance support:  Single extremity supported;Feet supported Sitting balance-Leahy Scale: Good     Standing balance support: Bilateral upper extremity supported;During functional activity Standing balance-Leahy Scale: Fair Standing balance comment: reliance on UE support for stability but able to static stand without device.                   ADL Overall ADL's : Needs assistance/impaired Eating/Feeding: Set up;Sitting                       Toilet Transfer: Minimal assistance;Ambulation;RW   Toileting- Clothing Manipulation and Hygiene: Minimal assistance;Sit to/from stand       Functional mobility during ADLs: Rolling walker;Minimal assistance General ADL Comments: Pt assisted to bathroom after attempting to get up on his own.      Vision                     Perception     Praxis      Cognition   Behavior During Therapy: WFL for tasks assessed/performed Overall Cognitive Status: Within Functional Limits for tasks assessed                       Extremity/Trunk Assessment               Exercises     Shoulder Instructions       General Comments      Pertinent Vitals/ Pain       Pain Assessment: Faces Faces Pain Scale: Hurts little more Pain Location: dit not specify Pain Descriptors / Indicators: Grimacing Pain  Intervention(s): Limited activity within patient's tolerance;Repositioned;Monitored during session  Home Living                                          Prior Functioning/Environment              Frequency  Min 2X/week        Progress Toward Goals  OT Goals(current goals can now be found in the care plan section)  Progress towards OT goals: Progressing toward goals  Acute Rehab OT Goals Patient Stated Goal: to go home OT Goal Formulation: With patient Time For Goal Achievement: 06/26/16 Potential to Achieve Goals: Good ADL Goals Pt Will Perform Grooming: with modified independence;standing Pt  Will Perform Lower Body Bathing: with modified independence;sit to/from stand Pt Will Perform Lower Body Dressing: with modified independence;sit to/from stand Pt Will Transfer to Toilet: with modified independence;ambulating;regular height toilet Pt Will Perform Toileting - Clothing Manipulation and hygiene: with modified independence;sit to/from stand Pt Will Perform Tub/Shower Transfer: Shower transfer;with modified independence;ambulating;shower seat;rolling walker  Plan Discharge plan remains appropriate    Co-evaluation                 End of Session Equipment Utilized During Treatment: Gait belt;Rolling walker   Activity Tolerance Patient tolerated treatment well   Patient Left in bed;with call bell/phone within reach;with bed alarm set   Nurse Communication Mobility status        Time: [id number] OT Time Calculation (min): 16 min  Charges: OT General Charges $OT Visit: 1 Procedure OT Treatments $Self Care/Home Management : 8-22 mins  Norman Herrlich, OTR/L [phone] 06/13/2016, 5:26 PM

## 2016-06-13 NOTE — Progress Notes (Addendum)
*  PRELIMINARY RESULTS* Vascular Ultrasound Lower extremity venous duplex has been completed.  Preliminary findings: No evidence of DVT or baker's cyst. Carotid Duplex: Right 40-59% ICA stenosis. Left 1-39% ICA stenosis. Antegrade vertebral flow.     Landry Mellow, RDMS, RVT  06/13/2016, 5:04 PM

## 2016-06-13 NOTE — OR Nursing (Signed)
After several attempt and with patient history of swallowing issues Dr. Liliane Channel was unable to pass probe. TEE abprted

## 2016-06-13 NOTE — Care Management Note (Signed)
Case Management Note  Patient Details  Name: Jorge Nixon MRN: [id number] Date of Birth: 1924-07-23  Subjective/Objective:               Patient was admitted with CVA. Lives at home with spouse. Will follow for discharge needs pending PT/OT evals and physician orders.      Action/Plan:   Expected Discharge Date:                  Expected Discharge Plan:     In-House Referral:     Discharge planning Services     Post Acute Care Choice:    Choice offered to:     DME Arranged:    DME Agency:     HH Arranged:    HH Agency:     Status of Service:     If discussed at [employer] of Stay Meetings, dates discussed:    Additional Comments:  Rolm Baptise, RN 06/13/2016, 9:48 AM

## 2016-06-13 NOTE — Progress Notes (Addendum)
PT Cancellation Note  Patient Details Name: Jorge Nixon MRN: [id number] DOB: [birth year]   Cancelled Treatment:    Reason Eval/Treat Not Completed: Patient at procedure or test/unavailable. Pt off unit at this time. Will check back as schedule allows to continue with PT POC.    Thelma Comp 06/13/2016, 10:02 AM    Addendum: Checked back on pt in afternoon. Pt declined working with PT due to fatigue. Will continue to follow.  Rolinda Roan, PT, DPT Acute Rehabilitation Services Pager: [phone]

## 2016-06-13 NOTE — Progress Notes (Signed)
OT Cancellation Note  Patient Details Name: Jorge Nixon MRN: [id number] DOB: [birth year]   Cancelled Treatment:    Reason Eval/Treat Not Completed: Patient at procedure or test/ unavailable. Pt off unit at this time. OT will check back as able.  [address] OTR/L [id number] 06/13/2016, 10:14 AM

## 2016-06-13 NOTE — CV Procedure (Signed)
TEE Attempted Patient has history of dysphagia and previous esophageal dilatation Sats decreased to 80% with 3 versed and 25 ug fentanyl Unable to pass probe Did not feel it was indicated or safe to proceed  Patient has not had regular TTE or f/u carotids would proceed with these And bubble study via TTE.   If primary service insists on TEE would get barium swallow first and consider doing with propofol But I would not attempt again  Jenkins Jorge Nixon

## 2016-06-13 NOTE — Consult Note (Signed)
ELECTROPHYSIOLOGY CONSULT NOTE  Patient ID: ABDALLA ADORNETTO MRN: [id number], DOB/AGE: [birth year]   Admit date: 06/11/2016 Date of Consult: 06/13/2016  Primary Physician: Wende Neighbors, MD Primary Cardiologist: Dr. Debara Pickett (last 2014) Reason for Consultation: Cryptogenic stroke ; recommendations regarding Implantable Loop Recorder Requesting MD: Dr. Erlinda Hong  History of Present Illness Jorge Nixon was admitted on 06/11/2016 with left sided weakness.  He first developed symptoms while driving and did get tPA upon arrival.  PMHx noted for CAD, HTN, HLD, hypothyroidism, COPD and prior stroke associated with carotid disease w/CEA.  Imaging demonstratedRight motor strip small cortical infarct, embolic pattern, etiology not clear.   He has undergone workup for stroke including echocardiogram that is pending and carotid angio.  The patient has been monitored on telemetry which has demonstrated sinus rhythm with no arrhythmias.  Inpatient stroke work-up was to be completed with a TEE, though unable to pursue with esophageal narrowing.   Echocardiogram this admission  Study Conclusions - Left ventricle: The cavity size was normal. Wall thickness was   increased in a pattern of mild LVH. Systolic function was normal.   The estimated ejection fraction was in the range of 50% to 55%.   Left ventricular diastolic function parameters were normal. - Aortic valve: There was mild regurgitation. - Mitral valve: Calcified annulus. Mildly thickened leaflets .   There was mild regurgitation.   Lab work is reviewed.  Prior to admission, the patient denies chest pain, shortness of breath, dizziness, palpitations, or syncope.  They are recovering from their stroke with plans to home at discharge.  EP has been asked to evaluate for placement of an implantable loop recorder to monitor for atrial fibrillation.     Past Medical History:  Diagnosis Date  . Anemia   . Arthritis   . BPH (benign prostatic hyperplasia)   .  Carotid artery occlusion   . COPD (chronic obstructive pulmonary disease) ([place])   . Coronary artery disease   . Diverticular disease 02/14/2011   problems swallowing  . Dizziness   . DVT (deep venous thrombosis) ([place])   . GERD (gastroesophageal reflux disease)   . Headache(784.0)   . History of blood transfusion   . Hypertension   . Hypothyroidism   . Kidney stone   . Nephrolithiasis    lithotripsy 2006  . Non-ischemic cardiomyopathy ([place])    mild  EF on echo 05/03/2012 55 to 60%  . Pneumonia    hx of - hospitalized  . Shortness of breath    exertion  . Stroke ([place]) 03-11-13  . TIA (transient ischemic attack)      Surgical History:  Past Surgical History:  Procedure Laterality Date  . CAROTID ENDARTERECTOMY Left 03-11-13   cea  . carpel tunnel Right   . ENDARTERECTOMY Left 03/11/2013   Procedure: ENDARTERECTOMY CAROTID with Vascu-Guard Bovine Patch.;  Surgeon: Conrad Nehalem, MD;  Location: [facility];  Service: Vascular;  Laterality: Left;  . JOINT REPLACEMENT Right    hip  . JOINT REPLACEMENT Left    knee  . KNEE ARTHROSCOPY Right 2010  . REPLACEMENT TOTAL KNEE Left 2006   left knee  . TOTAL HIP ARTHROPLASTY Right august 6th 2012   right side   . TRANSURETHRAL RESECTION OF PROSTATE  1996     Prescriptions Prior to Admission  Medication Sig Dispense Refill Last Dose  . Acetaminophen (TYLENOL PO) Take 1 capsule by mouth daily as needed (headache).   06/10/2016 at pm  . aspirin EC  81 MG tablet Take 81 mg by mouth daily.    06/11/2016 at Unknown time  . Docusate Calcium (STOOL SOFTENER PO) Take 1 capsule by mouth at bedtime.   06/10/2016 at Unknown time  . levothyroxine (SYNTHROID, LEVOTHROID) 88 MCG tablet Take 88 mcg by mouth daily.    06/11/2016 at Unknown time  . losartan-hydrochlorothiazide (HYZAAR) 50-12.5 MG per tablet Take 0.5 tablets by mouth daily.    06/11/2016 at Unknown time  . Nutritional Supplements (ENSURE COMPLETE PO) Take 237 mLs by mouth See admin instructions.  Drink one can (237 mls) by mouth every other night   maybe 1/12  . Omega-3 Fatty Acids (FISH OIL PO) Take 1 capsule by mouth daily.   06/11/2016 at Unknown time  . Psyllium (METAMUCIL PO) Take 2 scoop by mouth at bedtime. Mix in liquid and drink   06/10/2016 at Unknown time  . Sennosides (SENOKOT PO) Take 1 tablet by mouth at bedtime.   06/10/2016 at Unknown time    Inpatient Medications:  . atorvastatin  20 mg Oral q1800  . clopidogrel  75 mg Oral Daily  . enoxaparin (LOVENOX) injection  40 mg Subcutaneous Q24H  . levothyroxine  75 mcg Oral QAC breakfast  . pantoprazole  40 mg Oral Daily  . psyllium  1 packet Oral Daily  . senna  1 tablet Oral QHS    Allergies:  Allergies  Allergen Reactions  . Sulfa Antibiotics Rash  . Milk-Related Compounds Other (See Comments)    Upset stomach    Social History   Social History  . Marital status: Married    Spouse name: N/A  . Number of children: 1  . Years of education: N/A   Occupational History  . Retired [employer]    Social History Main Topics  . Smoking status: Never Smoker  . Smokeless tobacco: Never Used  . Alcohol use No  . Drug use: No  . Sexual activity: Not on file   Other Topics Concern  . Not on file   Social History Narrative  . No narrative on file     Family History  Problem Relation Age of Onset  . Stroke Father   . Heart attack Father   . Hypertension Mother   . Hypertension    . Diabetes        Review of Systems: All other systems reviewed and are otherwise negative except as noted above.  Physical Exam: Vitals:   06/12/16 1818 06/12/16 2210 06/13/16 0115 06/13/16 0550  BP: (!) 170/84 (!) 179/72 (!) 160/80 (!) 163/75  Pulse: 60 70 (!) 59 63  Resp: 20 16 18 18   Temp: 98.5 F (36.9 C) 97.9 F (36.6 C) 98.5 F (36.9 C) 98.2 F (36.8 C)  TempSrc: Oral Oral Oral Oral  SpO2: 100% 99% 97% 97%  Weight:      Height:        GEN- The patient is well appearing, alert and oriented x 3 today.     Head- normocephalic, atraumatic Eyes-  Sclera clear, conjunctiva pink Ears- hearing intact Oropharynx- clear Neck- supple Lungs- Clear to ausculation bilaterally, normal work of breathing Heart- Regular rate and rhythm, no murmurs, rubs or gallops  GI- soft, NT, ND Extremities- no clubbing, cyanosis, or edema MS- no significant deformity or atrophy Skin- no rash or lesion Psych- euthymic mood, full affect   Labs:   Lab Results  Component Value Date   WBC 5.0 06/11/2016   HGB 13.6 06/11/2016   HCT 40.0  06/11/2016   MCV 89.0 06/11/2016   PLT 140 (L) 06/11/2016    Recent Labs Lab 06/11/16 0950 06/11/16 0956  NA 140 142  K 3.6 3.8  CL 102 100*  CO2 29  --   BUN 22* 22*  CREATININE 1.20 1.20  CALCIUM 9.4  --   PROT 7.7  --   BILITOT 0.7  --   ALKPHOS 60  --   ALT 15*  --   AST 35  --   GLUCOSE 102* 103*   Lab Results  Component Value Date   CKTOTAL 62 02/15/2011   CKMB 4.7 (H) 02/15/2011   TROPONINI <0.03 02/09/2015   Lab Results  Component Value Date   CHOL 167 06/12/2016   CHOL 167 02/24/2013   Lab Results  Component Value Date   HDL 29 (L) 06/12/2016   HDL 25 (L) 02/24/2013   Lab Results  Component Value Date   LDLCALC 117 (H) 06/12/2016   LDLCALC 120 (H) 02/24/2013   Lab Results  Component Value Date   TRIG 105 06/12/2016   TRIG 109 02/24/2013   Lab Results  Component Value Date   CHOLHDL 5.8 06/12/2016   CHOLHDL 6.7 02/24/2013     No results found for: DDIMER   Radiology/Studies:  Ct Head Wo Contrast Result Date: 06/12/2016 CLINICAL DATA:  New onset headaches.  CVA. EXAM: CT HEAD WITHOUT CONTRAST TECHNIQUE: Contiguous axial images were obtained from the base of the skull through the vertex without intravenous contrast. COMPARISON:  MRI brain from the same day. FINDINGS: Brain: The acute nonhemorrhagic cortical infarct in the right precentral gyrus is again noted. There is no hemorrhage or mass lesion. No additional infarcts are present.  Moderate periventricular and subcortical white matter changes are again seen bilaterally. The ventricles are proportionate to the degree of atrophy. No significant extra-axial fluid collection is present. Vascular: Atherosclerotic calcifications are present within the cavernous internal carotid arteries and at the dural margin of the vertebral arteries bilaterally. There is no hyperdense vessel. Skull: No focal lytic or blastic lesions are present. The calvarium is intact. No significant extracranial soft tissue lesions are evident. Sinuses/Orbits: The paranasal sinuses and mastoid air cells are clear. The globes and orbits are within normal limits. IMPRESSION: 1. Acute nonhemorrhagic cortical infarct within the right precentral gyrus. 2. No additional infarcts. 3. Chronic atrophy and white matter disease is otherwise stable. 4. Atherosclerosis. Electronically Signed   By: San Morelle M.D.   On: 06/12/2016 14:41   Mr Jodene Nam Neck Wo Contrast Result Date: 06/12/2016 CLINICAL DATA:  Acute onset of left arm numbness and weakness at 8 a.m. yesterday. Treated with tPA yesterday. EXAM: MRI HEAD WITHOUT CONTRAST MRA HEAD WITHOUT CONTRAST MRA NECK WITHOUT CONTRAST TECHNIQUE: Multiplanar, multiecho pulse sequences of the brain and surrounding structures were obtained without intravenous contrast. Angiographic images of the Circle of Willis were obtained using MRA technique without intravenous contrast. Angiographic images of the neck were obtained using MRA technique without intravenous contrast. Carotid stenosis measurements (when applicable) are obtained utilizing NASCET criteria, using the distal internal carotid diameter as the denominator. COMPARISON:  CT head without contrast 06/11/2016. FINDINGS: MRI HEAD FINDINGS Brain: Diffusion-weighted images demonstrate an acute nonhemorrhagic cortical infarct involving the precentral gyrus of the left frontal lobe. Additional focal cortical infarcts are present adjacent  to the precentral sulcus as well. No acute infarct is present on the left. The study is mildly degraded by patient motion. T2 changes are associated with the areas of acute infarction.  Additional periventricular and subcortical T2 changes are advanced for age. The ventricles are proportionate to the degree of atrophy. No significant extra-axial fluid collection is present. The brainstem and cerebellum are normal. Vascular: Flow is present in the major intracranial arteries. The globes and orbits are intact. The paranasal sinuses and mastoid air cells are clear. Skull and upper cervical spine: The skullbase is within normal limits. Craniocervical junction is normal. Midline sagittal structures are unremarkable. Sinuses/Orbits: The paranasal sinuses and mastoid air cells are clear. The globes and orbits are within normal limits. MRA HEAD FINDINGS The time-of-flight images are somewhat degraded by patient motion. Mild irregularity is present within the cavernous internal carotid arteries bilaterally without focal stenosis. The internal carotid arteries are otherwise within normal limits to the ICA termini. The left A1 segment is hypoplastic. The right A1 segment is dominant. The anterior communicating artery is patent. There is narrowing and irregularity of the distal left M1 segment. The MCA bifurcations are intact. There is moderate attenuation of MCA branch vessels bilaterally. The left vertebral artery is the dominant vessel. Moderate narrowing is present within the right PICA origin. Basilar artery is normal. Both posterior cerebral arteries originate from basilar tip. There is moderate narrowing of the right P2 segment. MRA NECK FINDINGS Noncontrast time-of-flight images are submitted. The right common carotid artery is within normal limits. There is mild narrowing of the proximal right ICA. The more distal right ICA is within normal limits. The left common carotid artery is within normal limits. Mild narrowing  is present at the proximal left ICA without a significant distal stenosis. The left vertebral artery is slightly dominant to the right. No focal stenosis present in the neck. IMPRESSION: 1. Acute nonhemorrhagic cortical infarct in the posterior right frontal lobe along the pre frontal gyrus. 2. Additional cortical infarcts slightly more anterior in the right frontal lobe. 3. Moderate diffuse periventricular and subcortical white matter disease bilaterally suggesting extensive small vessel disease. 4. MRA circle of Willis is moderately degraded by patient motion but suggests diffuse small vessel disease. 5. Mild-to-moderate narrowing of the distal left M1 segment and proximal right PICA. 6. Moderate narrowing of the proximal right P2 segment. 7. Mild atherosclerotic narrowing of the proximal internal carotid artery bilaterally in the neck without focal stenosis. Electronically Signed   By: San Morelle M.D.   On: 06/12/2016 11:50     12-lead ECG SR, 1st degree AVblock, Q V1-3 All prior EKG's in EPIC reviewed with no documented atrial fibrillation Telemetry SR only  Assessment and Plan:  1. Cryptogenic stroke The patient presents with cryptogenic stroke.  TEE was aborted, and thoughts were to consider ILR without TEE.  I spoke at length with the patient about monitoring for afib with either a 30 day event monitor or an implantable loop recorder.  Risks, benefits, and alteratives to implantable loop recorder were discussed with the patient today.   At this time, the patient is very clear in their decision to proceed with implantable loop recorder.   In further discussion with neurology, Dr. Erlinda Hong, will await LE venous dopplers, if negative will pursue ILR, tomorrow if negative.    Baldwin Jamaica, PA-C 06/13/2016  Patient seen and examined. Discussed with Dr. Erlinda Hong. He awaits venous Dopplers and carotid Dopplers. In the event that these are not illuminating, we will plan implantable recorder but   will keep him on telemetry overnight

## 2016-06-13 NOTE — Progress Notes (Signed)
  Echocardiogram 2D Echocardiogram with Definity has been performed.  Diamond Nickel 06/13/2016, 9:34 AM

## 2016-06-14 ENCOUNTER — Encounter (HOSPITAL_COMMUNITY)
Admission: EM | Disposition: A | Discharge status: Home Health | Payer: Self-pay | Source: Home / Self Care | Attending: Neurology

## 2016-06-14 ENCOUNTER — Encounter (HOSPITAL_COMMUNITY): Payer: Self-pay | Admitting: Cardiology

## 2016-06-14 DIAGNOSIS — Z9889 Other specified postprocedural states: Secondary | ICD-10-CM

## 2016-06-14 DIAGNOSIS — I639 Cerebral infarction, unspecified: Principal | ICD-10-CM

## 2016-06-14 DIAGNOSIS — I10 Essential (primary) hypertension: Secondary | ICD-10-CM

## 2016-06-14 DIAGNOSIS — E785 Hyperlipidemia, unspecified: Secondary | ICD-10-CM

## 2016-06-14 DIAGNOSIS — I6529 Occlusion and stenosis of unspecified carotid artery: Secondary | ICD-10-CM

## 2016-06-14 DIAGNOSIS — Z8673 Personal history of transient ischemic attack (TIA), and cerebral infarction without residual deficits: Secondary | ICD-10-CM

## 2016-06-14 HISTORY — PX: EP IMPLANTABLE DEVICE: SHX172B

## 2016-06-14 LAB — VAS US CAROTID
LCCADDIAS: 21 cm/s
LCCAPDIAS: 13 cm/s
LCCAPSYS: 72 cm/s
LEFT ECA DIAS: -13 cm/s
LEFT VERTEBRAL DIAS: 12 cm/s
LICADDIAS: -14 cm/s
LICADSYS: -67 cm/s
LICAPDIAS: -16 cm/s
LICAPSYS: -102 cm/s
Left CCA dist sys: 100 cm/s
RIGHT ECA DIAS: -7 cm/s
RIGHT VERTEBRAL DIAS: 13 cm/s
Right CCA prox dias: 15 cm/s
Right CCA prox sys: 64 cm/s
Right cca dist sys: -58 cm/s

## 2016-06-14 SURGERY — LOOP RECORDER INSERTION
Anesthesia: LOCAL

## 2016-06-14 MED ORDER — ATORVASTATIN CALCIUM 20 MG PO TABS: 20.0000 mg | ORAL_TABLET | Freq: Every day | ORAL | 2 refills | Status: DC

## 2016-06-14 MED ORDER — LIDOCAINE-EPINEPHRINE 1 %-1:100000 IJ SOLN
INTRAMUSCULAR | Status: DC | PRN
  Administered 2016-06-14: 15 mL

## 2016-06-14 MED ORDER — CLOPIDOGREL BISULFATE 75 MG PO TABS: 75.0000 mg | ORAL_TABLET | Freq: Every day | ORAL | 2 refills | Status: DC

## 2016-06-14 SURGICAL SUPPLY — 2 items
LOOP REVEAL LINQSYS (Prosthesis & Implant Heart) ×1 IMPLANT
PACK LOOP INSERTION (CUSTOM PROCEDURE TRAY) ×2 IMPLANT

## 2016-06-14 NOTE — Discharge Summary (Signed)
Stroke Discharge Summary  Patient ID: Jorge Nixon   MRN: [id number]      DOB: [birth year]  Date of Admission: 06/11/2016 Date of Discharge: 06/14/2016  Attending Physician:  Rosalin Hawking, MD, Stroke MD Consulting Physician(s):   Virl Axe, MD (electrophysiology)  Patient's PCP:  Wende Neighbors, MD  DISCHARGE DIAGNOSIS:  Principal Problem:   CVA (cerebral vascular accident) ([place]) - sm R cortical cryptogenic infarct Active Problems:   Hypokalemia   Hypertension   CAD (coronary artery disease)   Carotid stenosis R  Past Medical History:  Diagnosis Date  . Anemia   . Arthritis   . BPH (benign prostatic hyperplasia)   . Carotid artery occlusion   . COPD (chronic obstructive pulmonary disease) ([place])   . Coronary artery disease   . Diverticular disease 02/14/2011   problems swallowing  . Dizziness   . DVT (deep venous thrombosis) ([place])   . GERD (gastroesophageal reflux disease)   . Headache(784.0)   . History of blood transfusion   . Hypertension   . Hypothyroidism   . Kidney stone   . Nephrolithiasis    lithotripsy 2006  . Non-ischemic cardiomyopathy ([place])    mild  EF on echo 05/03/2012 55 to 60%  . Pneumonia    hx of - hospitalized  . Shortness of breath    exertion  . Stroke ([place]) 03-11-13  . TIA (transient ischemic attack)    Past Surgical History:  Procedure Laterality Date  . CAROTID ENDARTERECTOMY Left 03-11-13   cea  . carpel tunnel Right   . ENDARTERECTOMY Left 03/11/2013   Procedure: ENDARTERECTOMY CAROTID with Vascu-Guard Bovine Patch.;  Surgeon: Conrad Wallace, MD;  Location: [facility];  Service: Vascular;  Laterality: Left;  . EP IMPLANTABLE DEVICE N/A 06/14/2016   Procedure: Loop Recorder Insertion;  Surgeon: Will Meredith Leeds, MD;  Location: [facility];  Service: Cardiovascular;  Laterality: N/A;  . JOINT REPLACEMENT Right    hip  . JOINT REPLACEMENT Left    knee  . KNEE ARTHROSCOPY Right 2010  . REPLACEMENT TOTAL KNEE Left 2006   left knee  .  TOTAL HIP ARTHROPLASTY Right august 6th 2012   right side   . TRANSURETHRAL RESECTION OF PROSTATE  1996    Allergies as of 06/14/2016      Reactions   Sulfa Antibiotics Rash   Milk-related Compounds Other (See Comments)   Upset stomach      Medication List    STOP taking these medications   aspirin EC 81 MG tablet     TAKE these medications   atorvastatin 20 MG tablet Commonly known as:  LIPITOR Take 1 tablet (20 mg total) by mouth daily at 6 PM.   clopidogrel 75 MG tablet Commonly known as:  PLAVIX Take 1 tablet (75 mg total) by mouth daily. Start taking on:  06/15/2016   ENSURE COMPLETE PO Take 237 mLs by mouth See admin instructions. Drink one can (237 mls) by mouth every other night   FISH OIL PO Take 1 capsule by mouth daily.   levothyroxine 88 MCG tablet Commonly known as:  SYNTHROID, LEVOTHROID Take 88 mcg by mouth daily.   losartan-hydrochlorothiazide 50-12.5 MG tablet Commonly known as:  HYZAAR Take 0.5 tablets by mouth daily.   METAMUCIL PO Take 2 scoop by mouth at bedtime. Mix in liquid and drink   SENOKOT PO Take 1 tablet by mouth at bedtime.   STOOL SOFTENER PO Take 1 capsule by mouth  at bedtime.   TYLENOL PO Take 1 capsule by mouth daily as needed (headache).            Durable Medical Equipment        Start     Ordered   06/13/16 1442  For home use only DME Walker rolling  Once    Question:  Patient needs a walker to treat with the following condition  Answer:  Stroke ([place])   06/13/16 1442      LABORATORY STUDIES CBC    Component Value Date/Time   WBC 5.0 06/11/2016 0950   RBC 4.46 06/11/2016 0950   HGB 13.6 06/11/2016 0956   HCT 40.0 06/11/2016 0956   PLT 140 (L) 06/11/2016 0950   MCV 89.0 06/11/2016 0950   MCH 29.6 06/11/2016 0950   MCHC 33.2 06/11/2016 0950   RDW 14.2 06/11/2016 0950   LYMPHSABS 1.0 06/11/2016 0950   MONOABS 0.5 06/11/2016 0950   EOSABS 0.1 06/11/2016 0950   BASOSABS 0.0 06/11/2016 0950   CMP     Component Value Date/Time   NA 142 06/11/2016 0956   K 3.8 06/11/2016 0956   CL 100 (L) 06/11/2016 0956   CO2 29 06/11/2016 0950   GLUCOSE 103 (H) 06/11/2016 0956   BUN 22 (H) 06/11/2016 0956   CREATININE 1.20 06/11/2016 0956   CALCIUM 9.4 06/11/2016 0950   PROT 7.7 06/11/2016 0950   ALBUMIN 4.3 06/11/2016 0950   AST 35 06/11/2016 0950   ALT 15 (L) 06/11/2016 0950   ALKPHOS 60 06/11/2016 0950   BILITOT 0.7 06/11/2016 0950   GFRNONAA 52 (L) 06/11/2016 0950   GFRAA 60 (L) 06/11/2016 0950   COAGS Lab Results  Component Value Date   INR 1.03 06/11/2016   INR 1.06 03/07/2013   INR 1.08 02/24/2013   Lipid Panel    Component Value Date/Time   CHOL 167 06/12/2016 0322   TRIG 105 06/12/2016 0322   HDL 29 (L) 06/12/2016 0322   CHOLHDL 5.8 06/12/2016 0322   VLDL 21 06/12/2016 0322   LDLCALC 117 (H) 06/12/2016 0322   HgbA1C  Lab Results  Component Value Date   HGBA1C 5.4 06/12/2016   Urinalysis    Component Value Date/Time   COLORURINE YELLOW 06/11/2016 1125   APPEARANCEUR CLEAR 06/11/2016 1125   LABSPEC 1.019 06/11/2016 1125   PHURINE 5.0 06/11/2016 1125   GLUCOSEU NEGATIVE 06/11/2016 1125   HGBUR NEGATIVE 06/11/2016 1125   BILIRUBINUR NEGATIVE 06/11/2016 1125   KETONESUR NEGATIVE 06/11/2016 1125   PROTEINUR NEGATIVE 06/11/2016 1125   UROBILINOGEN 0.2 02/09/2015 1050   NITRITE NEGATIVE 06/11/2016 1125   LEUKOCYTESUR NEGATIVE 06/11/2016 1125   Urine Drug Screen     Component Value Date/Time   LABOPIA NONE DETECTED 06/11/2016 1125   COCAINSCRNUR NONE DETECTED 06/11/2016 1125   LABBENZ NONE DETECTED 06/11/2016 1125   AMPHETMU NONE DETECTED 06/11/2016 1125   THCU NONE DETECTED 06/11/2016 1125   LABBARB NONE DETECTED 06/11/2016 1125    Alcohol Level    Component Value Date/Time   ETH <5 06/11/2016 0950     SIGNIFICANT DIAGNOSTIC STUDIES I have personally reviewed the radiological images below and agree with the radiology interpretations.  Ct Head Code  Stroke W/o Cm 06/11/2016 1. No acute intracranial abnormality  2. ASPECTS is 10/10  3. Stable moderate atrophy and white matter disease, somewhat advanced for age.   Ct Head Wo Contrast 06/12/2016 1. Acute nonhemorrhagic cortical infarct within the right precentral gyrus. 2. No additional infarcts. 3. Chronic  atrophy and white matter disease is otherwise stable. 4. Atherosclerosis.   Mri and Mra head Wo Contrast 06/12/2016 1. Acute nonhemorrhagic cortical infarct in the posterior right frontal lobe along the pre frontal gyrus. 2. Additional cortical infarcts slightly more anterior in the right frontal lobe. 3. Moderate diffuse periventricular and subcortical white matter disease bilaterally suggesting extensive small vessel disease. 4. MRA circle of Willis is moderately degraded by patient motion but suggests diffuse small vessel disease. 5. Mild-to-moderate narrowing of the distal left M1 segment and proximal right PICA. 6. Moderate narrowing of the proximal right P2 segment. 7. Mild atherosclerotic narrowing of the proximal internal carotid artery bilaterally in the neck without focal stenosis.    CUS  Right 40-59% ICA stenosis. Left 1-39% ICA stenosis. Antegrade vertebral flow.   TEE  not successful   LE venous doppler  No evidence of DVT or baker's cyst.  TTE  - Left ventricle: The cavity size was normal. Wall thickness wasincreased in a pattern of mild LVH. Systolic function was normal.The estimated ejection fraction was in the range of 50% to 55%.Left ventricular diastolic function parameters were normal. - Aortic valve: There was mild regurgitation. - Mitral valve: Calcified annulus. Mildly thickened leaflets.There was mild regurgitation.     HISTORY OF PRESENT ILLNESS Jorge Nixon a 83 y.o.maleright hand dominant presented to [facility] with acute onset of left arm weakness and numbness around 8AM on 06/11/2016. He reports he was driving to [facility] when this  happened and he continued to drive. He then decided to come to ER because it wasn't getting better and he was concerned about a stroke. He reports a stroke 2 years ago which caused right sided weakness but now back to normal. He had to get carotid endarterectomy after that stroke. He took aspirin for 2 weeks but then reduced the dose to 81mg  daily. He reports taking 81mg  aspirin every day and last dose was this morning. His NIHSS at the OSH was 4 and teleneurology recommended tPA which was administered at 1045AM. He reports his symptoms are improving but he is still having difficulty closing his left arm. NIHSS: 1 (possible left arm dysmetria. Has difficulty left thumb and index finger closing). Premorbid modified rankin scale: 0 He was transferred to [facility] and admitted to the neuro ICU for further evaluation and treatment.    HOSPITAL COURSE Mr. KODEN LEEDER is a 83 y.o. male with history of a previous stroke 2 years ago, TIAs, S/P carotid endarterectomy, nonischemic cardiomyopathy, hypertension, DVT history, coronary artery disease, and COPD, presenting with left-sided weakness. He  received IV t-PA at [facility] on 06/11/2016 at 1045 AM.  Stroke:  Right motor strip small cortical infarct s/p IV tPA, embolic pattern, etiology not clear.   Resultant  Left index finger and middle finger weakness  MRI - right frontal motor strip small infarcts  MRA head and neck- diffuse atherosclerosis, no focal high-grade stenosis, b/l proximal ICA no significant stenosis  Repeat CT Head - no hemorrhage post TPA  Carotid Doppler - right ICA 40-59% stenosis, left ICA patent  2D Echo - EF 50-55%  LE venous doppler - no DVT  TEE not successful (unable to pass probe)  Loop recorder implanted ([employer]) to rule out atrial fibrillation as source of stroke  LDL - 117  HgbA1c - 5.4   aspirin 81 mg daily prior to admission, changed to plavix. Continue Plavix on discharge.  Patient counseled to be compliant with his  antithrombotic medications  Ongoing  aggressive stroke risk factor management  Therapy recommendations: HH PT, no OT, tub seat/shower chair, RW w/ 5" wheels  Disposition: return home with wife  Carotid stenosis s/p left CEA in 2014  Left MCA infarct 01/2013  CUS left ICA 50-69% stenosis  Cerebral angio left ICA 75% stenosis  Had left CEA in 02/2013   Last follow up with [facility] in 09/2015 CUS showed wide patent left ICA and no right ICA stenosis  CUS 06/14/15 - right ICA 40-59% stenosis, left ICA patent  However, MRA neck showed mild atherosclerotic narrowing of the proximal internal carotid artery bilaterally in the neck without focal stenosis.   Hypertension  Stable  Permissive hypertension (OK if < 220/120) but gradually normalize in 5-7 days  Long-term BP goal normotensive  Hyperlipidemia  Home meds:  No lipid lowering medications prior to admission.  LDL 117, goal < 70  Added Lipitor 20 mg daily  Continue statin at discharge  Other Stroke Risk Factors  Advanced age  Hx stroke in 01/2013 with left MCA infarct  Family hx stroke (father)  Coronary artery disease  Hx of DVT   DISCHARGE EXAM Blood pressure (!) 116/50, pulse 60, temperature 97.4 F (36.3 C), temperature source Oral, resp. rate 16, height 5\' 11"  (1.803 m), weight 63.5 kg (140 lb), SpO2 98 %. General - Well nourished, well developed, in no apparent distress.  Ophthalmologic - SFundi not visualized due to noncooperation.  Cardiovascular - Regular rate and rhythm.  Mental Status -  Level of arousal and orientation to time, place, and person were intact. Language including expression, naming, repetition, comprehension was assessed and found intact. Fund of Knowledge was assessed and was impaired.  Cranial Nerves II - XII - II - Visual field intact OU. III, IV, VI - Extraocular movements intact. V - Facial sensation intact bilaterally. VII - Facial movement intact bilaterally. VIII -  Hearing & vestibular intact bilaterally. X - Palate elevates symmetrically. XI - Chin turning & shoulder shrug intact bilaterally. XII - Tongue protrusion intact.  Motor Strength - The patient's strength was normal in all extremities except left index and middle finger weak flexion and pronator drift was absent.  Bulk was normal and fasciculations were absent.   Motor Tone - Muscle tone was assessed at the neck and appendages and was normal.  Reflexes - The patient's reflexes were 1+ in all extremities and he had no pathological reflexes.  Sensory - Light touch, temperature/pinprick were assessed and were symmetrical.    Coordination - The patient had normal movements in the hands with no ataxia or dysmetria.  Tremor was absent.  Gait and Station - deferred.  Discharge Diet   Diet Heart Room service appropriate? Yes; Fluid consistency: Thin liquids  DISCHARGE PLAN  Disposition:  Home with wife  clopidogrel 75 mg daily for secondary stroke prevention.  Ongoing risk factor control by Primary Care Physician at time of discharge  Follow-up Wende Neighbors, MD in 2 weeks.  Follow-up with Dr. Rosalin Hawking, Stroke Clinic in 6 weeks, office to schedule an appointment.  45 minutes were spent preparing discharge.  Rosalin Hawking, MD PhD Stroke Neurology 06/14/2016 3:22 PM

## 2016-06-14 NOTE — Progress Notes (Signed)
Occupational Therapy Treatment Patient Details Name: Jorge Nixon MRN: [id number] DOB: 18-Sep-1924 Today's Date: 06/14/2016    History of present illness 83 y.o. male with acute onset of left arm weakness and numbness. tPA administered. MRI showed acute infarct in posterior R frontal lobe. PMHx: COPD, CAD, GERD, HTN, Non-ischemic cardiomyopathy, CVA, TIA.    OT comments  Pt progressing well toward OT goals. He required supervision for safety with all ADL tasks this session and demonstrated much improved activity tolerance for ADL. Educated pt on fall prevention and energy conservation strategies and provided handouts. Pt demonstrates understanding of all education topics. OT will continue to follow acutely to improve independence with ADL and functional mobility.    Follow Up Recommendations  No OT follow up;Supervision/Assistance - 24 hour    Equipment Recommendations  Tub/shower seat    Recommendations for Other Services      Precautions / Restrictions Precautions Precautions: Fall Restrictions Weight Bearing Restrictions: No       Mobility Bed Mobility         Supine to sit: Modified independent (Device/Increase time)     General bed mobility comments: OOB in recliner on OT arrival  Transfers Overall transfer level: Needs assistance Equipment used: Rolling walker (2 wheeled) Transfers: Sit to/from Stand Sit to Stand: Supervision         General transfer comment: Increased time and VC's for safety.    Balance Overall balance assessment: Needs assistance Sitting-balance support: Single extremity supported;Feet supported Sitting balance-Leahy Scale: Good     Standing balance support: No upper extremity supported;During functional activity Standing balance-Leahy Scale: Fair Standing balance comment: Able to stand statically without UE support but requires B UE support during functional ambulation.                   ADL Overall ADL's : Needs  assistance/impaired     Grooming: Supervision/safety;Standing               Lower Body Dressing: Sit to/from stand;Supervision/safety Lower Body Dressing Details (indicate cue type and reason): Overall required supervision and increased time. Did require assist for button but this was because he was unable to see button hold due to bulky gowns. Able to fasten belt as well as zipper. Toilet Transfer: Supervision/safety;Ambulation;RW   Toileting- Clothing Manipulation and Hygiene: Supervision/safety;Sit to/from stand       Functional mobility during ADLs: Supervision/safety;Rolling walker General ADL Comments: Close supervision required. Improved activity tolerance for ADL this session.      Vision                     Perception     Praxis      Cognition   Behavior During Therapy: WFL for tasks assessed/performed Overall Cognitive Status: Within Functional Limits for tasks assessed                       Extremity/Trunk Assessment               Exercises     Shoulder Instructions       General Comments      Pertinent Vitals/ Pain       Pain Assessment: No/denies pain  Home Living                                          Prior Functioning/Environment  Frequency  Min 2X/week        Progress Toward Goals  OT Goals(current goals can now be found in the care plan section)  Progress towards OT goals: Progressing toward goals  Acute Rehab OT Goals Patient Stated Goal: to go home OT Goal Formulation: With patient Time For Goal Achievement: 06/26/16 Potential to Achieve Goals: Good ADL Goals Pt Will Perform Grooming: with modified independence;standing Pt Will Perform Lower Body Bathing: with modified independence;sit to/from stand Pt Will Perform Lower Body Dressing: with modified independence;sit to/from stand Pt Will Transfer to Toilet: with modified independence;ambulating;regular height  toilet Pt Will Perform Toileting - Clothing Manipulation and hygiene: with modified independence;sit to/from stand Pt Will Perform Tub/Shower Transfer: Shower transfer;with modified independence;ambulating;shower seat;rolling walker  Plan Discharge plan remains appropriate    Co-evaluation                 End of Session Equipment Utilized During Treatment: Gait belt;Rolling walker   Activity Tolerance Patient tolerated treatment well   Patient Left with call bell/phone within reach;in chair;with chair alarm set   Nurse Communication Mobility status        Time: [id number] OT Time Calculation (min): 18 min  Charges: OT General Charges $OT Visit: 1 Procedure OT Treatments $Self Care/Home Management : 8-22 mins  Norman Herrlich, OTR/L [phone] 06/14/2016, 5:05 PM

## 2016-06-14 NOTE — Progress Notes (Signed)
Patient Name: Jorge Nixon Date of Encounter: 06/14/2016  Primary Cardiologist: none  Hospital Problem List     Active Problems:   CVA (cerebral vascular accident) [facility])     Subjective   Feeling well, stroke symptoms resolved  Inpatient Medications    Scheduled Meds: . atorvastatin  20 mg Oral q1800  . clopidogrel  75 mg Oral Daily  . enoxaparin (LOVENOX) injection  40 mg Subcutaneous Q24H  . levothyroxine  75 mcg Oral QAC breakfast  . pantoprazole  40 mg Oral Daily  . psyllium  1 packet Oral Daily  . senna  1 tablet Oral QHS   Continuous Infusions: . sodium chloride 10 mL/hr at 06/13/16 1501   PRN Meds: acetaminophen **OR** [DISCONTINUED] acetaminophen (TYLENOL) oral liquid 160 mg/5 mL **OR** [DISCONTINUED] acetaminophen, hydrALAZINE   Vital Signs    Vitals:   06/13/16 2100 06/14/16 0100 06/14/16 0844 06/14/16 1007  BP: (!) 155/82 (!) 144/69 131/61 (!) 116/50  Pulse: 82 74 70 60  Resp: 18 18 16 16   Temp: 98.5 F (36.9 C) 98.9 F (37.2 C) 97.7 F (36.5 C) 97.4 F (36.3 C)  TempSrc: Oral Oral Oral Oral  SpO2: 97% 96% 96% 98%  Weight:      Height:        Intake/Output Summary (Last 24 hours) at 06/14/16 1112 Last data filed at 06/13/16 1600  Gross per 24 hour  Intake           249.83 ml  Output                0 ml  Net           249.83 ml   Filed Weights   06/11/16 0944  Weight: 140 lb (63.5 kg)    Physical Exam    GEN: Well nourished, well developed, in no acute distress.  HEENT: Grossly normal.  Neck: Supple, no JVD, carotid bruits, or masses. Cardiac: RRR, no murmurs, rubs, or gallops. No clubbing, cyanosis, edema.  Radials/DP/PT 2+ and equal bilaterally.  Respiratory:  Respirations regular and unlabored, clear to auscultation bilaterally. GI: Soft, nontender, nondistended, BS + x 4. MS: no deformity or atrophy. Skin: warm and dry, no rash. Neuro:  Strength and sensation are intact. Psych: AAOx3.  Normal affect.  Labs    CBC No results  for input(s): WBC, NEUTROABS, HGB, HCT, MCV, PLT in the last 72 hours. Basic Metabolic Panel No results for input(s): NA, K, CL, CO2, GLUCOSE, BUN, CREATININE, CALCIUM, MG, PHOS in the last 72 hours. Liver Function Tests No results for input(s): AST, ALT, ALKPHOS, BILITOT, PROT, ALBUMIN in the last 72 hours. No results for input(s): LIPASE, AMYLASE in the last 72 hours. Cardiac Enzymes No results for input(s): CKTOTAL, CKMB, CKMBINDEX, TROPONINI in the last 72 hours. BNP Invalid input(s): POCBNP D-Dimer No results for input(s): DDIMER in the last 72 hours. Hemoglobin A1C  Recent Labs  06/12/16 0322  HGBA1C 5.4   Fasting Lipid Panel  Recent Labs  06/12/16 0322  CHOL 167  HDL 29*  LDLCALC 117*  TRIG 105  CHOLHDL 5.8   Thyroid Function Tests No results for input(s): TSH, T4TOTAL, T3FREE, THYROIDAB in the last 72 hours.  Invalid input(s): FREET3  Telemetry    Sinus rhythm with runs of APCs - Personally Reviewed  ECG    Sinus rhythm, 1 degree AV block - Personally Reviewed  Radiology    Ct Head Wo Contrast  Result Date: 06/12/2016 CLINICAL DATA:  New onset  headaches.  CVA. EXAM: CT HEAD WITHOUT CONTRAST TECHNIQUE: Contiguous axial images were obtained from the base of the skull through the vertex without intravenous contrast. COMPARISON:  MRI brain from the same day. FINDINGS: Brain: The acute nonhemorrhagic cortical infarct in the right precentral gyrus is again noted. There is no hemorrhage or mass lesion. No additional infarcts are present. Moderate periventricular and subcortical white matter changes are again seen bilaterally. The ventricles are proportionate to the degree of atrophy. No significant extra-axial fluid collection is present. Vascular: Atherosclerotic calcifications are present within the cavernous internal carotid arteries and at the dural margin of the vertebral arteries bilaterally. There is no hyperdense vessel. Skull: No focal lytic or blastic lesions  are present. The calvarium is intact. No significant extracranial soft tissue lesions are evident. Sinuses/Orbits: The paranasal sinuses and mastoid air cells are clear. The globes and orbits are within normal limits. IMPRESSION: 1. Acute nonhemorrhagic cortical infarct within the right precentral gyrus. 2. No additional infarcts. 3. Chronic atrophy and white matter disease is otherwise stable. 4. Atherosclerosis. Electronically Signed   By: San Morelle M.D.   On: 06/12/2016 14:41    Cardiac Studies    - Left ventricle: The cavity size was normal. Wall thickness was   increased in a pattern of mild LVH. Systolic function was normal.   The estimated ejection fraction was in the range of 50% to 55%.   Left ventricular diastolic function parameters were normal. - Aortic valve: There was mild regurgitation. - Mitral valve: Calcified annulus. Mildly thickened leaflets .   There was mild regurgitation.  Patient Profile     83 y.o. male who presented to the hospital with ischemic stroke. Symptoms have since resolved.   Assessment & Plan    1. Cryptogenic stroke Patient presents with cryptogenic stroke. Symptoms have since resolved. Plan for LINQ implant. Risks and benefits explained. Risks include but not limited to bleeding and infection. The patient understands the risks and has agreed to [employer] implant.  Signed, Gloristine Turrubiates Meredith Leeds, MD  06/14/2016, 11:12 AM

## 2016-06-14 NOTE — Progress Notes (Signed)
Patient is discharged from room 5C06 at this time. Alert and in stable condition. IV site d/c'd as well as tele. Instructions read to patient with understanding verbalized. Left unit via wheelchair with all belongings at side.

## 2016-06-14 NOTE — Discharge Instructions (Signed)
Keep incision clean and dry (no shower) for 3 days. °You can remove outer dressing tomorrow. °Leave steri-strips (little pieces of tape) on until seen in the office for wound check appointment. °Call the office ([phone]) for redness, drainage, swelling, or fever. ° °

## 2016-06-14 NOTE — Progress Notes (Signed)
Physical Therapy Treatment Patient Details Name: SOHAN OSTLING MRN: [id number] DOB: 08-Sep-1924 Today's Date: 07/12/2016    History of Present Illness 83 y.o. male with acute onset of left arm weakness and numbness. tPA administered. MRI showed acute infarct in posterior R frontal lobe. PMHx: COPD, CAD, GERD, HTN, Non-ischemic cardiomyopathy, CVA, TIA.     PT Comments    Patient progressing with gait distance, stairs, safety and LE strength.  Will be safe for d/c home with family support and follow up HHPT.  Will continue PT until d/c.   Follow Up Recommendations  Home health PT;Supervision/Assistance - 24 hour     Equipment Recommendations  Rolling walker with 5" wheels    Recommendations for Other Services       Precautions / Restrictions Precautions Precautions: Fall    Mobility  Bed Mobility         Supine to sit: Modified independent (Device/Increase time)        Transfers Overall transfer level: Needs assistance Equipment used: Rolling walker (2 wheeled) Transfers: Sit to/from Stand Sit to Stand: Supervision         General transfer comment: increased time, cues for safety  Ambulation/Gait Ambulation/Gait assistance: Supervision Ambulation Distance (Feet): 250 Feet Assistive device: Rolling walker (2 wheeled) Gait Pattern/deviations: Step-through pattern;Trunk flexed;Decreased stride length     General Gait Details: cues for forward gaze, posture   Stairs Stairs: Yes   Stair Management: Two rails;Alternating pattern;Forwards Number of Stairs: 4 General stair comments: no physical assist, appropriately uses railing  Wheelchair Mobility    Modified Rankin (Stroke Patients Only) Modified Rankin (Stroke Patients Only) Pre-Morbid Rankin Score: No symptoms Modified Rankin: Moderately severe disability     Balance Overall balance assessment: Needs assistance   Sitting balance-Leahy Scale: Good     Standing balance support: No upper extremity  supported;During functional activity Standing balance-Leahy Scale: Fair Standing balance comment: dons/doffs shorts in bathroom and washes hands at sink no UE support, increased time                    Cognition Arousal/Alertness: Awake/alert Behavior During Therapy: WFL for tasks assessed/performed Overall Cognitive Status: Within Functional Limits for tasks assessed                      Exercises      General Comments        Pertinent Vitals/Pain Pain Assessment: No/denies pain    Home Living                      Prior Function            PT Goals (current goals can now be found in the care plan section) Progress towards PT goals: Progressing toward goals    Frequency    Min 4X/week      PT Plan Current plan remains appropriate    Co-evaluation             End of Session Equipment Utilized During Treatment: Gait belt Activity Tolerance: Patient tolerated treatment well Patient left: in chair;with call bell/phone within reach;with chair alarm set;with family/visitor present     Time: [id number] PT Time Calculation (min) (ACUTE ONLY): 23 min  Charges:  $Gait Training: 23-37 mins                    G Codes:      Reginia Naas 2016/07/12, 2:19 PM Magda Kiel, [place] 12-Jul-2016

## 2016-06-15 NOTE — Care Management Note (Signed)
Case Management Note  Patient Details  Name: Jorge Nixon MRN: [id number] Date of Birth: Jun 24, 1924  Subjective/Objective:                    Action/Plan: 06/15/2016 at 1106 am: Pt discharged home yesterday with orders for [facility] services. CM called and spoke to his wife and went over a list of [facility] agencies. She selected [place]. Santiago Glad with [facility] notified and accepted the referral. Pt stated he already had a walker at home.  Expected Discharge Date:  06/14/16               Expected Discharge Plan:  [place]  In-House Referral:     Discharge planning Services  CM Consult  Post Acute Care Choice:  Durable Medical Equipment, Home Health (Pt stated he already had walker at home) Choice offered to:  Patient, Spouse  DME Arranged:    DME Agency:     HH Arranged:  PT [place]:  [place]  Status of Service:  Completed, signed off  If discussed at [place] of Stay Meetings, dates discussed:    Additional Comments:  Pollie Friar, RN 06/15/2016, 11:03 AM

## 2016-06-21 DIAGNOSIS — I639 Cerebral infarction, unspecified: Secondary | ICD-10-CM | POA: Diagnosis not present

## 2016-06-21 DIAGNOSIS — Z09 Encounter for follow-up examination after completed treatment for conditions other than malignant neoplasm: Secondary | ICD-10-CM | POA: Diagnosis not present

## 2016-06-24 ENCOUNTER — Telehealth: Payer: Self-pay | Admitting: Internal Medicine

## 2016-06-24 NOTE — Telephone Encounter (Signed)
°  New Prob  Calling to verify if PCPs office is okay to do wound check as pt is having some difficulty finding transportation to get to [place]. Please call.  Pt is scheduled for wound check at [address] on 1/29.

## 2016-06-24 NOTE — Telephone Encounter (Signed)
Spoke with patient to reschedule wound check appointment to [place] office as he cannot find transportation to [place].  Patient is agreeable to wound check appointment on 06/30/16 at 3:00pm.  He is aware of the office address.  Walked patient through manual transmission as monitor has not transmitted since 1/17.  Transmission received, many episodes of TWOS.  Reviewed AF episodes with Dr. Liz Beach likely show SR w/PACs but will also review with Dr. [name] per Dr. Jackalyn Lombard recommendation.  Placed in [facility] folder.  Will plan to reprogram for TWOS at wound check appointment.  Patient educated about home monitor and instructed to keep it plugged in alongside bed.  Patient verbalizes understanding of all instructions.  He is appreciative and denies additional questions or concerns at this time.

## 2016-06-27 ENCOUNTER — Ambulatory Visit: Payer: Medicare Other

## 2016-06-30 ENCOUNTER — Ambulatory Visit (INDEPENDENT_AMBULATORY_CARE_PROVIDER_SITE_OTHER): Payer: Medicare Other | Admitting: *Deleted

## 2016-06-30 DIAGNOSIS — Z8673 Personal history of transient ischemic attack (TIA), and cerebral infarction without residual deficits: Secondary | ICD-10-CM

## 2016-06-30 LAB — CUP PACEART INCLINIC DEVICE CHECK
MDC IDC PG IMPLANT DT: 20180116
MDC IDC SESS DTM: 20180201151748

## 2016-06-30 NOTE — Progress Notes (Signed)
Wound check in clinic s/p ILR implant. Steri strips removed. Incision edges approximated. Wound well healed without redness or edema. Normal device function. Battery status: GOOD. R-waves 0.28mV. 0 symptom episodes, (29) tachy episodes---TWOS per ECGs--sensing threshold decay delay extended to 450ms, 0 pause episodes, 0 brady episodes. (103) "AF" episodes (4.9% burden)--TWOS, SR w/PACs per ECGs. Patient education completed. Monthly summary reports and ROV with WC prn.

## 2016-07-14 ENCOUNTER — Ambulatory Visit (INDEPENDENT_AMBULATORY_CARE_PROVIDER_SITE_OTHER): Payer: Medicare Other | Admitting: *Deleted

## 2016-07-14 DIAGNOSIS — I639 Cerebral infarction, unspecified: Secondary | ICD-10-CM

## 2016-07-14 NOTE — Progress Notes (Signed)
Carelink Summary Report / Loop Recorder 

## 2016-07-15 ENCOUNTER — Other Ambulatory Visit: Payer: Self-pay | Admitting: Internal Medicine

## 2016-07-21 ENCOUNTER — Telehealth: Payer: Self-pay | Admitting: Cardiology

## 2016-07-21 NOTE — Telephone Encounter (Signed)
Spoke w/ pt and requested that he send a manual transmission b/c his home monitor has not updated in at least 14 days.   

## 2016-07-26 ENCOUNTER — Telehealth: Payer: Self-pay

## 2016-07-26 NOTE — Telephone Encounter (Signed)
Spoke with pt regarding an appointment with Dr. [name], pt stated that he doesn't drive to [facility], informed pt that Dr. [name] does not go to [place], and that he couldn't just see a doctor in [place], that Dr. [name] would be the one to address the episodes on his loop recorder . Pt stated that he thought that he could get someone to bring him to [place]. Pt agreeable to apt 08/03/2016.

## 2016-07-28 ENCOUNTER — Other Ambulatory Visit: Payer: Self-pay | Admitting: Cardiology

## 2016-07-28 ENCOUNTER — Encounter: Payer: Self-pay | Admitting: Cardiology

## 2016-08-03 ENCOUNTER — Encounter: Payer: Medicare Other | Admitting: Cardiology

## 2016-08-04 ENCOUNTER — Telehealth: Payer: Self-pay | Admitting: Cardiology

## 2016-08-04 LAB — CUP PACEART REMOTE DEVICE CHECK
Implantable Pulse Generator Implant Date: 20180116
MDC IDC SESS DTM: 20180215163747

## 2016-08-04 NOTE — Telephone Encounter (Signed)
Spoke w/ pt and requested that he send a manual transmission b/c his home monitor has not updated in at least 14 days.   

## 2016-08-05 ENCOUNTER — Telehealth: Payer: Self-pay | Admitting: *Deleted

## 2016-08-05 NOTE — Telephone Encounter (Signed)
Spoke with patient regarding cancelled appointment on 3/7.  Patient reports he has no way to get to [facility] and he would like to only see providers in [place].  Advised patient that he needs an appointment to discuss starting [place] for PAF detected on his ILR.  Informed patient that Dr. [name] does not go to the [facility] office and that the EP who does has no openings through May.  Per Dr. [name], ok to schedule with APP in [place].  Patient verbalizes understanding.  Patient is agreeable to an appointment with Jory Sims, NP on 08/16/16 at 2:10pm at [facility] office.  He denies additional questions or concerns at this time.  AF episode was noted on ILR on 07/20/16.

## 2016-08-11 ENCOUNTER — Other Ambulatory Visit: Payer: Self-pay | Admitting: Cardiology

## 2016-08-15 ENCOUNTER — Ambulatory Visit (INDEPENDENT_AMBULATORY_CARE_PROVIDER_SITE_OTHER): Payer: Medicare Other | Admitting: *Deleted

## 2016-08-15 ENCOUNTER — Encounter: Payer: Self-pay | Admitting: Physician Assistant

## 2016-08-15 DIAGNOSIS — I639 Cerebral infarction, unspecified: Secondary | ICD-10-CM | POA: Diagnosis not present

## 2016-08-15 NOTE — Progress Notes (Addendum)
Cardiology Office Note    Date:  08/16/2016  ID:  Nixon, Nixon 04/27/1925, MRN [id number] PCP:  Wende Neighbors, MD  Cardiologist:  Dr. Caryl Comes saw in consult 05/2016, Dr. [name] reviewed [place] - lives in [facility]   Chief Complaint: atrial fib identified on monitor  History of Present Illness:  Jorge Nixon is a 83 y.o. male with history of HTN, HLD, BPH s/p TURP, hypothyroidism, COPD, prior TIA 2014 associated with carotid disease s/p L CEA, stroke 05/2016, anemia, esophageal dysmotility, esophageal web, arthritis with prior joint replacements, diverticular disease, DVT, hypothyroidism, NICM (prior EF 45% with normal nuc in 11/2010), probable CKD stage III per labs. His chart carries forward diagnosis of CAD from an old orthopedic note but he ias never had this confirmed - normal nuc in 2012.  He was admitted 05/2016 with cryptogenic stroke. He received TPA. TEE was attempted but the patient desaturated with sedation and the probe was unable to be passed. Carotid duplex showed Right 40-59% ICA stenosis; Left 1-39% ICA stenosis. 2D echo showed mild LVH, EF 50-55%, mild AI/MR. He was continued on Plavix and statin was added. Labs 05/2016: LDL 117, A1C wnl, Cr 1.20 (c/w prior - GFR <60), K 3.6, Hgb 13.2, plt 140, LFTs wnl. Most recent LINQ transmission 07/14/16 showed 33 tachy episodes--false, TWOS. 119 AF episodes (4.0%)--ECGs suggested SR arrhythmia w/ ectopy and TWOS as well as ? true AF. Dr. [name] reviewed this and agreed this was notable for atrial fib and recommended f/u to further discuss anticoagulation.  The patient returns for follow-up with his family. He's been totally unaware of any atrial fib whatosever. No chest pain, dyspnea, palpitations, recent falls. No bleeding issues on Plavix. He reports having bloodwork done at his PCP's office on Friday including cholesterol and was told everything "looked good." He has not had any recent falls. He walks 5 days a week, uses a cane nearby just in  case. He wishes to have his ILR removed, states he can "feel it" at night.  Past Medical History:  Diagnosis Date  . Anemia   . Arthritis   . BPH (benign prostatic hyperplasia)   . Carotid disease, bilateral ([place])    a. s/p L CEA in 2014. b. Duplex 05/2016: Right 40-59% ICA stenosis; Left 1-39% ICA stenosis  . CKD (chronic kidney disease), stage III   . COPD (chronic obstructive pulmonary disease) ([place])   . Diverticular disease 02/14/2011   problems swallowing  . DVT (deep venous thrombosis) ([place])   . Esophageal dysmotility   . Esophageal web   . GERD (gastroesophageal reflux disease)   . Headache(784.0)   . History of blood transfusion   . Hyperlipidemia   . Hypertension   . Hypothyroidism   . Kidney stone   . Nephrolithiasis    lithotripsy 2006  . Non-ischemic cardiomyopathy ([place])    a. Prior EF 45%, normal nuc 2012. b. EF 50-55% in 2018.  Jorge Nixon PAF (paroxysmal atrial fibrillation) ([place])    a. identified on monitor 06/2016.  Jorge Nixon Pneumonia    hx of - hospitalized  . Stroke ([place]) 05/2016  . TIA (transient ischemic attack)     Past Surgical History:  Procedure Laterality Date  . CAROTID ENDARTERECTOMY Left 03-11-13   cea  . carpel tunnel Right   . ENDARTERECTOMY Left 03/11/2013   Procedure: ENDARTERECTOMY CAROTID with Vascu-Guard Bovine Patch.;  Surgeon: Conrad Louisburg, MD;  Location: [facility];  Service: Vascular;  Laterality: Left;  Jorge Nixon EP  IMPLANTABLE DEVICE N/A 06/14/2016   Procedure: Loop Recorder Insertion;  Surgeon: Will Meredith Leeds, MD;  Location: [facility] CV LAB;  Service: Cardiovascular;  Laterality: N/A;  . JOINT REPLACEMENT Right    hip  . JOINT REPLACEMENT Left    knee  . KNEE ARTHROSCOPY Right 2010  . REPLACEMENT TOTAL KNEE Left 2006   left knee  . TOTAL HIP ARTHROPLASTY Right august 6th 2012   right side   . TRANSURETHRAL RESECTION OF PROSTATE  1996    Current Medications: Current Outpatient Prescriptions  Medication Sig Dispense Refill  . Acetaminophen  (TYLENOL PO) Take 1 capsule by mouth daily as needed (headache).    Jorge Nixon atorvastatin (LIPITOR) 20 MG tablet Take 1 tablet (20 mg total) by mouth daily at 6 PM. 30 tablet 2  . clopidogrel (PLAVIX) 75 MG tablet Take 1 tablet (75 mg total) by mouth daily. 30 tablet 2  . Docusate Calcium (STOOL SOFTENER PO) Take 1 capsule by mouth at bedtime.    Jorge Nixon levothyroxine (SYNTHROID, LEVOTHROID) 88 MCG tablet Take 88 mcg by mouth daily.     Jorge Nixon losartan-hydrochlorothiazide (HYZAAR) 50-12.5 MG per tablet Take 0.5 tablets by mouth daily.     . Nutritional Supplements (ENSURE COMPLETE PO) Take 237 mLs by mouth See admin instructions. Drink one can (237 mls) by mouth every other night    . Omega-3 Fatty Acids (FISH OIL PO) Take 1 capsule by mouth daily.    . Psyllium (METAMUCIL PO) Take 2 scoop by mouth at bedtime. Mix in liquid and drink    . Sennosides (SENOKOT PO) Take 1 tablet by mouth at bedtime.     No current facility-administered medications for this visit.      Allergies:   Sulfa antibiotics and Milk-related compounds   Social History   Social History  . Marital status: Married    Spouse name: N/A  . Number of children: 1  . Years of education: N/A   Occupational History  . Retired [employer]    Social History Main Topics  . Smoking status: Never Smoker  . Smokeless tobacco: Never Used  . Alcohol use No  . Drug use: No  . Sexual activity: Not Asked   Other Topics Concern  . None   Social History Narrative  . None     Family History:  Family History  Problem Relation Age of Onset  . Stroke Father   . Heart attack Father   . Hypertension Mother   . Hypertension    . Diabetes      ROS:   Please see the history of present illness.  All other systems are reviewed and otherwise negative.    PHYSICAL EXAM:   VS:  BP 106/62   Pulse 80   Ht 5\' 10"  (1.778 m)   Wt 141 lb (64 kg)   SpO2 97%   BMI 20.23 kg/m   BMI: Body mass index is 20.23 kg/m. GEN: Well nourished, well  developed WM, in no acute distress  HEENT: normocephalic, atraumatic Neck: no JVD, carotid bruits, or masses Cardiac: RRR; no murmurs, rubs, or gallops, no edema  Respiratory:  clear to auscultation bilaterally, normal work of breathing GI: soft, nontender, nondistended, + BS MS: no deformity or atrophy  Skin: warm and dry, no rash Neuro:  Alert and Oriented x 3, Strength and sensation are intact, follows commands Psych: euthymic mood, full affect  Wt Readings from Last 3 Encounters:  08/16/16 141 lb (64 kg)  06/11/16 140  lb (63.5 kg)  10/23/15 148 lb (67.1 kg)      Studies/Labs Reviewed:   EKG:   EKG was not ordered today.  Recent Labs: 06/11/2016: ALT 15; BUN 22; Creatinine, Ser 1.20; Hemoglobin 13.6; Platelets 140; Potassium 3.8; Sodium 142   Lipid Panel    Component Value Date/Time   CHOL 167 06/12/2016 0322   TRIG 105 06/12/2016 0322   HDL 29 (L) 06/12/2016 0322   CHOLHDL 5.8 06/12/2016 0322   VLDL 21 06/12/2016 0322   LDLCALC 117 (H) 06/12/2016 0322    Additional studies/ records that were reviewed today include: Summarized above.    ASSESSMENT & PLAN:   1. Cryptogenic stroke - found to have PAF on loop recorder. Discussed risks/benefits of anticoagulation with patient and importance of observing for any bleeding, falls, or changes in medical status. He is agreeable. Will stop clopidogrel and start Xarelto 15mg  qsupper (based on CrCl 37 in 05/2016). Our office tried to call Dr. Juel Burrow office to obtain recent labs from last week but they are at lunch. Will try again this afternoon - if they did not obtain a recent hemoglobin or creatinine, we will have the patient return for this. He technically qualifies for full dose Eliquis but I think given his age and CKD, I would feel more comfortable with him being on the renally adjusted lower dose of Xarelto. Will plan on f/u OV in 1 month to make sure all is going well on anticoagulation - would recommend to consider repeat  CBC/BMET at that visit for surveillance. Will also arrange for him to have his ILR removed. He feels it is uncomfortable for him to feel this under his skin and he doesn't wish to continue home monitoring at all. Now that we've identified atrial fib (and it's asymptomatic), this is reasonable. Per discussion with Chanetta Marshall, no need to hold blood thinner for this - will try to schedule on a day when Dr. [name] is in the lab. Addendum: PCP results from 08/12/16 reviewed, Hgb 13.1, Platelets 143, Creatinine 1.04 (CrCl 43.76ml/min), TSH 0.851. 2. Paroxysmal atrial fib - asymptomatic. Continue rate control strategy given that he's not felt any ill effects from this other than the embolic phenomena which will be treated with blood thinner. Will await to see what labs primary care has done. If he has not had a recent TSH I would suggest this. 3. Carotid artery disease - continue risk factor modification with statin therapy. 4. CKD stage III - will need to follow periodically given anticoagulation use. 5. Hyperlipidemia - being followed by primary care.  Disposition: F/u in [place] in 1 month - will see if one of our cardiologists has an opening as he needs to establish care here in [facility]. [place] is too far for him.   Medication Adjustments/Labs and Tests Ordered: Current medicines are reviewed at length with the patient today.  Concerns regarding medicines are outlined above. Medication changes, Labs and Tests ordered today are summarized above and listed in the Patient Instructions accessible in Encounters.   Signed, Melina Copa PA-C  08/16/2016 1:08 PM    [facility] in [place]. [address], [place] [address] Ph: [phone]; Fax [phone]

## 2016-08-15 NOTE — Progress Notes (Signed)
Carelink Summary Report / Loop Recorder 

## 2016-08-16 ENCOUNTER — Ambulatory Visit (INDEPENDENT_AMBULATORY_CARE_PROVIDER_SITE_OTHER): Payer: Medicare Other | Admitting: Physician Assistant

## 2016-08-16 ENCOUNTER — Encounter: Payer: Self-pay | Admitting: *Deleted

## 2016-08-16 ENCOUNTER — Encounter: Payer: Self-pay | Admitting: Physician Assistant

## 2016-08-16 VITALS — BP 106/62 | HR 80 | Ht 70.0 in | Wt 141.0 lb

## 2016-08-16 DIAGNOSIS — N183 Chronic kidney disease, stage 3 unspecified: Secondary | ICD-10-CM

## 2016-08-16 DIAGNOSIS — I480 Paroxysmal atrial fibrillation: Secondary | ICD-10-CM | POA: Diagnosis not present

## 2016-08-16 DIAGNOSIS — I779 Disorder of arteries and arterioles, unspecified: Secondary | ICD-10-CM | POA: Diagnosis not present

## 2016-08-16 DIAGNOSIS — E785 Hyperlipidemia, unspecified: Secondary | ICD-10-CM | POA: Diagnosis not present

## 2016-08-16 DIAGNOSIS — I639 Cerebral infarction, unspecified: Secondary | ICD-10-CM

## 2016-08-16 DIAGNOSIS — I739 Peripheral vascular disease, unspecified: Secondary | ICD-10-CM

## 2016-08-16 MED ORDER — RIVAROXABAN 15 MG PO TABS: 15.0000 mg | ORAL_TABLET | Freq: Every day | ORAL | 11 refills | Status: DC

## 2016-08-16 NOTE — Patient Instructions (Addendum)
Your physician recommends that you schedule a follow-up appointment in: 1 Month  Your physician has recommended you make the following change in your medication: Stop Taking Clopidogrel ( Plavix)  Start Taking Xarelto 15 mg Take 1 Tablet Daily with supper.   We will request labs from Dr. Nevada Crane.   If you need a refill on your cardiac medications before your next appointment, please call your pharmacy.  Thank you for choosing [facility]!

## 2016-08-16 NOTE — Addendum Note (Signed)
Addended by: Levonne Hubert on: 08/16/2016 03:18 PM   Modules accepted: Orders

## 2016-08-23 ENCOUNTER — Encounter (HOSPITAL_COMMUNITY): Payer: Self-pay | Admitting: Cardiology

## 2016-08-23 ENCOUNTER — Encounter (HOSPITAL_COMMUNITY)
Admission: RE | Disposition: A | Discharge status: Home / Self Care | Payer: Self-pay | Source: Ambulatory Visit | Attending: Cardiology

## 2016-08-23 ENCOUNTER — Ambulatory Visit (HOSPITAL_COMMUNITY)
Admission: RE | Admit: 2016-08-23 | Discharge: 2016-08-23 | Disposition: A | Discharge status: Home / Self Care | Payer: Medicare Other | Source: Ambulatory Visit | Attending: Cardiology | Admitting: Cardiology

## 2016-08-23 DIAGNOSIS — Z8673 Personal history of transient ischemic attack (TIA), and cerebral infarction without residual deficits: Secondary | ICD-10-CM | POA: Diagnosis not present

## 2016-08-23 DIAGNOSIS — I4891 Unspecified atrial fibrillation: Secondary | ICD-10-CM | POA: Diagnosis not present

## 2016-08-23 DIAGNOSIS — I638 Other cerebral infarction: Secondary | ICD-10-CM | POA: Diagnosis not present

## 2016-08-23 DIAGNOSIS — Z4509 Encounter for adjustment and management of other cardiac device: Secondary | ICD-10-CM | POA: Diagnosis not present

## 2016-08-23 HISTORY — PX: LOOP RECORDER REMOVAL: EP1215

## 2016-08-23 SURGERY — LOOP RECORDER REMOVAL

## 2016-08-23 MED ORDER — LIDOCAINE HCL (PF) 1 % IJ SOLN
INTRAMUSCULAR | Status: AC
  Filled 2016-08-23: qty 30

## 2016-08-23 MED ORDER — LIDOCAINE HCL (PF) 1 % IJ SOLN
INTRAMUSCULAR | Status: DC | PRN
  Administered 2016-08-23: 30 mL

## 2016-08-23 SURGICAL SUPPLY — 1 items: PACK LOOP INSERTION (CUSTOM PROCEDURE TRAY) ×3 IMPLANT

## 2016-08-23 NOTE — H&P (Signed)
Jorge Nixon is a 83 y.o. male with a history of cryptogenic stroke. He presented to the hospital in January and had a LINQ monitor implanted. Atrial fibrillation was found and he was put on anticoagulation. Currently, he says that he can feel the device in place and wishes it to be explanted. Risks and benefits of the explant were explained. Risks include bleeding and infection, among others. He understands the risks of the procedure and has agreed to explant of his LINQ monitor.  Anurag Scarfo [name], MD 08/23/2016 7:26 AM

## 2016-08-24 ENCOUNTER — Ambulatory Visit: Payer: Self-pay | Admitting: Nurse Practitioner

## 2016-08-26 LAB — CUP PACEART REMOTE DEVICE CHECK
Implantable Pulse Generator Implant Date: 20180116
MDC IDC SESS DTM: 20180317163931

## 2016-08-26 NOTE — Progress Notes (Signed)
Carelink summary report received. Battery status OK. Normal device function. No new symptom episodes, brady, or pause episodes. 44 tachy- available ECGs appear TWOS, some noise artifact noted. 172 AF 3% +Xarelto, available ECGs appear TWOS/ vent. undersensing and some appear AF.  Monthly summary reports and ROV/PRN

## 2016-09-15 ENCOUNTER — Ambulatory Visit (INDEPENDENT_AMBULATORY_CARE_PROVIDER_SITE_OTHER): Payer: Medicare Other | Admitting: Adult Health

## 2016-09-15 ENCOUNTER — Encounter: Payer: Self-pay | Admitting: Adult Health

## 2016-09-15 VITALS — BP 138/70 | HR 55 | Ht 70.0 in | Wt 139.0 lb

## 2016-09-15 DIAGNOSIS — I480 Paroxysmal atrial fibrillation: Secondary | ICD-10-CM | POA: Diagnosis not present

## 2016-09-15 DIAGNOSIS — I639 Cerebral infarction, unspecified: Secondary | ICD-10-CM | POA: Diagnosis not present

## 2016-09-15 DIAGNOSIS — I10 Essential (primary) hypertension: Secondary | ICD-10-CM

## 2016-09-15 MED ORDER — LOSARTAN POTASSIUM-HCTZ 50-12.5 MG PO TABS: 0.5000 | ORAL_TABLET | Freq: Every day | ORAL | 3 refills | Status: DC

## 2016-09-15 MED ORDER — ATORVASTATIN CALCIUM 20 MG PO TABS: 20.0000 mg | ORAL_TABLET | Freq: Every day | ORAL | 3 refills | Status: DC

## 2016-09-15 MED ORDER — RIVAROXABAN 15 MG PO TABS: 15.0000 mg | ORAL_TABLET | Freq: Every day | ORAL | 3 refills | Status: DC

## 2016-09-15 NOTE — Progress Notes (Signed)
Cardiology Office Note   Date:  09/15/2016   ID:  Ean, Gettel [birth year], MRN [id number]  PCP:  Wende Neighbors, MD  Cardiologist: [place]/  Jory Sims, NP   Chief Complaint  Patient presents with  . Atrial Fibrillation  . Cerebrovascular Accident      History of Present Illness: Jorge Nixon is a 83 y.o. male who presents for ongoing assessment and management of patient with cryptogenic CVA, atrial fibrillation, explantation of loop recorder on 08/23/2016. Other history includes nonischemic cardiomyopathy, with most recent EF 45%, hypertension, carotid artery disease status post left CEA in 2014, chronic kidney disease, COPD, and history of DVT.  Due to history of paroxysmal atrial fibrillation, the patient was started on anticoagulation therapy with Xarelto 15 mg daily. HE comes today feeling well. He states he walks a mile a day, has no chest pain, dizziness, dyspnea or fatigue. He is medically compliant.   Past Medical History:  Diagnosis Date  . Anemia   . Arthritis   . BPH (benign prostatic hyperplasia)   . Carotid disease, bilateral ([place])    a. s/p L CEA in 2014. b. Duplex 05/2016: Right 40-59% ICA stenosis; Left 1-39% ICA stenosis  . CKD (chronic kidney disease), stage III   . COPD (chronic obstructive pulmonary disease) ([place])   . Diverticular disease 02/14/2011   problems swallowing  . DVT (deep venous thrombosis) ([place])   . Esophageal dysmotility   . Esophageal web   . GERD (gastroesophageal reflux disease)   . Headache(784.0)   . History of blood transfusion   . Hyperlipidemia   . Hypertension   . Hypothyroidism   . Kidney stone   . Nephrolithiasis    lithotripsy 2006  . Non-ischemic cardiomyopathy ([place])    a. Prior EF 45%, normal nuc 2012. b. EF 50-55% in 2018.  [name] PAF (paroxysmal atrial fibrillation) ([place])    a. identified on monitor 06/2016.  [name] Pneumonia    hx of - hospitalized  . Stroke ([place]) 05/2016  . TIA (transient ischemic attack)     Past  Surgical History:  Procedure Laterality Date  . CAROTID ENDARTERECTOMY Left 03-11-13   cea  . carpel tunnel Right   . ENDARTERECTOMY Left 03/11/2013   Procedure: ENDARTERECTOMY CAROTID with Vascu-Guard Bovine Patch.;  Surgeon: Conrad Gordon, MD;  Location: [facility];  Service: Vascular;  Laterality: Left;  . EP IMPLANTABLE DEVICE N/A 06/14/2016   Procedure: Loop Recorder Insertion;  Surgeon: Will Meredith Leeds, MD;  Location: [facility] [place] [facility];  Service: Cardiovascular;  Laterality: N/A;  . JOINT REPLACEMENT Right    hip  . JOINT REPLACEMENT Left    knee  . KNEE ARTHROSCOPY Right 2010  . LOOP RECORDER REMOVAL N/A 08/23/2016   Procedure: Loop Recorder Removal;  Surgeon: Will Meredith Leeds, MD;  Location: [facility];  Service: Cardiovascular;  Laterality: N/A;  . REPLACEMENT TOTAL KNEE Left 2006   left knee  . TOTAL HIP ARTHROPLASTY Right august 6th 2012   right side   . TRANSURETHRAL RESECTION OF PROSTATE  1996     Current Outpatient Prescriptions  Medication Sig Dispense Refill  . acetaminophen (TYLENOL) 500 MG tablet Take 500 mg by mouth daily as needed for headache.    [name] atorvastatin (LIPITOR) 20 MG tablet Take 1 tablet (20 mg total) by mouth daily at 6 PM. 30 tablet 2  . levothyroxine (SYNTHROID, LEVOTHROID) 88 MCG tablet Take 88 mcg by mouth daily.     [name] losartan-hydrochlorothiazide (  HYZAAR) 50-12.5 MG per tablet Take 0.5 tablets by mouth daily.     . Nutritional Supplements (ENSURE COMPLETE PO) Take 237 mLs by mouth See admin instructions. Drink one can (237 mls) by mouth every other night    . Omega-3 Fatty Acids (FISH OIL) 1200 MG CAPS Take 1,200 mg by mouth daily.    . Psyllium (METAMUCIL PO) Take 2 scoop by mouth at bedtime. Mix in liquid and drink    . Rivaroxaban (XARELTO) 15 MG TABS tablet Take 1 tablet (15 mg total) by mouth daily with supper. 30 tablet 11  . senna (SENOKOT) 8.6 MG TABS tablet Take 1 tablet by mouth at bedtime.     No current facility-administered  medications for this visit.     Allergies:   Sulfa antibiotics and Milk-related compounds    Social History:  The patient  reports that he has never smoked. He has never used smokeless tobacco. He reports that he does not drink alcohol or use drugs.   Family History:  The patient's family history includes Heart attack in his father; Hypertension in his mother; Stroke in his father.    ROS: All other systems are reviewed and negative. Unless otherwise mentioned in H&P    PHYSICAL EXAM: VS:  BP 138/70   Pulse (!) 55   Ht 5\' 10"  (1.778 m)   Wt 139 lb (63 kg)   SpO2 97%   BMI 19.94 kg/m  , BMI Body mass index is 19.94 kg/m. GEN: Well nourished, well developed, in no acute distress  HEENT: normal  Neck: no JVD, carotid bruits, or masses Cardiac: RRR; occasional extra systole,  no murmurs, rubs, or gallops,no edema  Respiratory:  clear to auscultation bilaterally, normal work of breathing GI: soft, nontender, nondistended, + BS MS: no deformity or atrophy  Explanted loop recorder site well healed.  Skin: warm and dry, no rash Neuro:  Strength and sensation are intact Psych: euthymic mood, full affect  Recent Labs: 06/11/2016: ALT 15; BUN 22; Creatinine, Ser 1.20; Hemoglobin 13.6; Platelets 140; Potassium 3.8; Sodium 142    Lipid Panel    Component Value Date/Time   CHOL 167 06/12/2016 0322   TRIG 105 06/12/2016 0322   HDL 29 (L) 06/12/2016 0322   CHOLHDL 5.8 06/12/2016 0322   VLDL 21 06/12/2016 0322   LDLCALC 117 (H) 06/12/2016 0322      Wt Readings from Last 3 Encounters:  09/15/16 139 lb (63 kg)  08/23/16 140 lb (63.5 kg)  08/16/16 141 lb (64 kg)      Other studies Reviewed: Echocardiogram 2\33\0076 Left ventricle: The cavity size was normal. Wall thickness was   increased in a pattern of mild LVH. Systolic function was normal.   The estimated ejection fraction was in the range of 50% to 55%.   Left ventricular diastolic function parameters were normal. -  Aortic valve: There was mild regurgitation. - Mitral valve: Calcified annulus. Mildly thickened leaflets .   There was mild regurgitation.   ASSESSMENT AND PLAN:  1.  Atrial fib: Heart rate is currently controlled. He has no bleeding issues on Xarelto. He is due for follow up labs and will be seeing PCP, Dr. Nevada Crane within the next two months. No changes in regimen at this time  2, Hypertension: BP is controlled. He denies dizziness or BP elevations at home. He remains very active.   3. Hypercholesterolemia: Continue statin therapy. Labs per PCP   Current medicines are reviewed at length with the patient today.  Labs/ tests ordered today include:  No orders of the defined types were placed in this encounter.    Disposition:   FU with 5 months  Signed, Jory Sims, NP  09/15/2016 1:23 PM    [facility] [address], [place], [place] [address] Phone: [phone]; Fax: [phone]

## 2016-09-15 NOTE — Patient Instructions (Signed)
Your physician wants you to follow-up in: 6 Months.  You will receive a reminder letter in the mail two months in advance. If you don't receive a letter, please call our office to schedule the follow-up appointment.  Your physician recommends that you continue on your current medications as directed. Please refer to the Current Medication list given to you today.  If you need a refill on your cardiac medications before your next appointment, please call your pharmacy.  Thank you for choosing [facility]!   

## 2016-09-21 ENCOUNTER — Telehealth: Payer: Self-pay | Admitting: Adult Health

## 2016-09-21 ENCOUNTER — Other Ambulatory Visit: Payer: Self-pay

## 2016-09-21 MED ORDER — RIVAROXABAN 15 MG PO TABS: 15.0000 mg | ORAL_TABLET | Freq: Every day | ORAL | 3 refills | Status: DC

## 2016-09-21 MED ORDER — ATORVASTATIN CALCIUM 20 MG PO TABS: 20.0000 mg | ORAL_TABLET | Freq: Every day | ORAL | 3 refills | Status: DC

## 2016-09-21 NOTE — Telephone Encounter (Signed)
°*  STAT* If patient is at the pharmacy, call can be transferred to refill team.   1. Which medications need to be refilled? (please list name of each medication and dose if known)  Rivaroxaban (XARELTO) 15 MG TABS tablet [[id number]]   atorvastatin (LIPITOR) 20 MG tablet [[id number]]     2. Which pharmacy/location (including street and city if local pharmacy) is medication to be sent to?  [place]--others were sent to [place]   3. Do they need a 30 day or 90 day supply?   90 day

## 2016-09-22 ENCOUNTER — Other Ambulatory Visit: Payer: Self-pay

## 2016-09-22 NOTE — Patient Outreach (Signed)
First attempt to obtain mRS. No answer. Left message for returned call.

## 2016-09-23 NOTE — Patient Outreach (Signed)
Patient returned call. mRS successfully obtained. mRS = 0

## 2016-09-26 ENCOUNTER — Other Ambulatory Visit: Payer: Self-pay | Admitting: Adult Health

## 2016-09-26 MED ORDER — ATORVASTATIN CALCIUM 20 MG PO TABS: 20.0000 mg | ORAL_TABLET | Freq: Every day | ORAL | 3 refills | Status: DC

## 2016-09-26 NOTE — Telephone Encounter (Signed)
° ° ° °  1. Which medications need to be refilled? (please list name of each medication and dose if known) atorvastatin (LIPITOR) 20 MG tablet [[id number]]   2. Which pharmacy/location (including street and city if local pharmacy) is medication to be sent to? [facility]   3. Do they need a 30 day or 90 day supply?  90 day  Looks like his Rx were sent to [place]

## 2016-09-26 NOTE — Telephone Encounter (Signed)
Pt has [facility] as pharmacy, I changed to [facility]

## 2016-09-30 ENCOUNTER — Telehealth: Payer: Self-pay | Admitting: Adult Health

## 2016-09-30 NOTE — Telephone Encounter (Signed)
Changed pharmacy to [facility]

## 2016-09-30 NOTE — Telephone Encounter (Signed)
Wants to change pharmacy to [facility]. / tg

## 2016-10-19 ENCOUNTER — Encounter: Payer: Self-pay | Admitting: Family

## 2016-10-28 ENCOUNTER — Encounter: Payer: Self-pay | Admitting: Family

## 2016-10-28 ENCOUNTER — Ambulatory Visit (INDEPENDENT_AMBULATORY_CARE_PROVIDER_SITE_OTHER): Payer: Medicare Other | Admitting: Family

## 2016-10-28 ENCOUNTER — Ambulatory Visit (HOSPITAL_COMMUNITY)
Admission: RE | Admit: 2016-10-28 | Discharge: 2016-10-28 | Disposition: A | Discharge status: Home / Self Care | Payer: Medicare Other | Source: Ambulatory Visit | Attending: Vascular Surgery | Admitting: Vascular Surgery

## 2016-10-28 VITALS — BP 157/69 | HR 54 | Resp 18 | Ht 70.0 in | Wt 136.3 lb

## 2016-10-28 DIAGNOSIS — I6522 Occlusion and stenosis of left carotid artery: Secondary | ICD-10-CM | POA: Diagnosis present

## 2016-10-28 DIAGNOSIS — Z9889 Other specified postprocedural states: Secondary | ICD-10-CM | POA: Diagnosis not present

## 2016-10-28 DIAGNOSIS — Z8673 Personal history of transient ischemic attack (TIA), and cerebral infarction without residual deficits: Secondary | ICD-10-CM | POA: Insufficient documentation

## 2016-10-28 DIAGNOSIS — Z48812 Encounter for surgical aftercare following surgery on the circulatory system: Secondary | ICD-10-CM | POA: Insufficient documentation

## 2016-10-28 DIAGNOSIS — I6521 Occlusion and stenosis of right carotid artery: Secondary | ICD-10-CM | POA: Diagnosis not present

## 2016-10-28 LAB — VAS US CAROTID
LCCADDIAS: -27 cm/s
LCCAPSYS: 64 cm/s
LEFT ECA DIAS: 0 cm/s
LEFT VERTEBRAL DIAS: -13 cm/s
LICADDIAS: -21 cm/s
LICAPSYS: -66 cm/s
Left CCA dist sys: -118 cm/s
Left CCA prox dias: 12 cm/s
Left ICA dist sys: -78 cm/s
Left ICA prox dias: -15 cm/s
RIGHT CCA MID DIAS: -11 cm/s
RIGHT ECA DIAS: 0 cm/s
RIGHT VERTEBRAL DIAS: -13 cm/s
Right CCA prox dias: 12 cm/s
Right CCA prox sys: 66 cm/s
Right cca dist sys: -104 cm/s

## 2016-10-28 NOTE — Patient Instructions (Signed)
Stroke Prevention Some medical conditions and behaviors are associated with an increased chance of having a stroke. You may prevent a stroke by making healthy choices and managing medical conditions. How can I reduce my risk of having a stroke?  Stay physically active. Get at least 30 minutes of activity on most or all days.  Do not smoke. It may also be helpful to avoid exposure to secondhand smoke.  Limit alcohol use. Moderate alcohol use is considered to be: ? No more than 2 drinks per day for men. ? No more than 1 drink per day for nonpregnant women.  Eat healthy foods. This involves: ? Eating 5 or more servings of fruits and vegetables a day. ? Making dietary changes that address high blood pressure (hypertension), high cholesterol, diabetes, or obesity.  Manage your cholesterol levels. ? Making food choices that are high in fiber and low in saturated fat, trans fat, and cholesterol may control cholesterol levels. ? Take any prescribed medicines to control cholesterol as directed by your health care provider.  Manage your diabetes. ? Controlling your carbohydrate and sugar intake is recommended to manage diabetes. ? Take any prescribed medicines to control diabetes as directed by your health care provider.  Control your hypertension. ? Making food choices that are low in salt (sodium), saturated fat, trans fat, and cholesterol is recommended to manage hypertension. ? Ask your health care provider if you need treatment to lower your blood pressure. Take any prescribed medicines to control hypertension as directed by your health care provider. ? If you are 18-39 years of age, have your blood pressure checked every 3-5 years. If you are 40 years of age or older, have your blood pressure checked every year.  Maintain a healthy weight. ? Reducing calorie intake and making food choices that are low in sodium, saturated fat, trans fat, and cholesterol are recommended to manage  weight.  Stop drug abuse.  Avoid taking birth control pills. ? Talk to your health care provider about the risks of taking birth control pills if you are over 35 years old, smoke, get migraines, or have ever had a blood clot.  Get evaluated for sleep disorders (sleep apnea). ? Talk to your health care provider about getting a sleep evaluation if you snore a lot or have excessive sleepiness.  Take medicines only as directed by your health care provider. ? For some people, aspirin or blood thinners (anticoagulants) are helpful in reducing the risk of forming abnormal blood clots that can lead to stroke. If you have the irregular heart rhythm of atrial fibrillation, you should be on a blood thinner unless there is a good reason you cannot take them. ? Understand all your medicine instructions.  Make sure that other conditions (such as anemia or atherosclerosis) are addressed. Get help right away if:  You have sudden weakness or numbness of the face, arm, or leg, especially on one side of the body.  Your face or eyelid droops to one side.  You have sudden confusion.  You have trouble speaking (aphasia) or understanding.  You have sudden trouble seeing in one or both eyes.  You have sudden trouble walking.  You have dizziness.  You have a loss of balance or coordination.  You have a sudden, severe headache with no known cause.  You have new chest pain or an irregular heartbeat. Any of these symptoms may represent a serious problem that is an emergency. Do not wait to see if the symptoms will go away.   Get medical help at once. Call your local emergency services (911 in [place] Do not drive yourself to the hospital. This information is not intended to replace advice given to you by your health care provider. Make sure you discuss any questions you have with your health care provider. Document Released: 06/23/2004 Document Revised: 10/22/2015 Document Reviewed: 11/16/2012 Elsevier  Interactive Patient Education  2017 Elsevier Inc.     Preventing Cerebrovascular Disease Arteries are blood vessels that carry blood that contains oxygen from the heart to all parts of the body. Cerebrovascular disease affects arteries that supply the brain. Any condition that blocks or disrupts blood flow to the brain can cause cerebrovascular disease. Brain cells that lose blood supply start to die within minutes (stroke). Stroke is the main danger of cerebrovascular disease. Atherosclerosis and high blood pressure are common causes of cerebrovascular disease. Atherosclerosis is narrowing and hardening of an artery that results when fat, cholesterol, calcium, or other substances (plaque) build up inside an artery. Plaque reduces blood flow through the artery. High blood pressure increases the risk of bleeding inside the brain. Making diet and lifestyle changes to prevent atherosclerosis and high blood pressure lowers your risk of cerebrovascular disease. What nutrition changes can be made?  Eat more fruits, vegetables, and whole grains.  Reduce how much saturated fat you eat. To do this, eat less red meat and fewer full-fat dairy products.  Eat healthy proteins instead of red meat. Healthy proteins include: ? Fish. Eat fish that contains heart-healthy omega-3 fatty acids, twice a week. Examples include salmon, albacore tuna, mackerel, and herring. ? Chicken. ? Nuts. ? Low-fat or nonfat yogurt.  Avoid processed meats, like bacon and lunchmeat.  Avoid foods that contain: ? A lot of sugar, such as sweets and drinks with added sugar. ? A lot of salt (sodium). Avoid adding extra salt to your food, as told by your health care provider. ? Trans fats, such as margarine and baked goods. Trans fats may be listed as "partially hydrogenated oils" on food labels.  Check food labels to see how much sodium, sugar, and trans fats are in foods.  Use vegetable oils that contain low amounts of  saturated fat, such as olive oil or canola oil. What lifestyle changes can be made?  Drink alcohol in moderation. This means no more than 1 drink a day for nonpregnant women and 2 drinks a day for men. One drink equals 12 oz of beer, 5 oz of wine, or 1 oz of hard liquor.  If you are overweight, ask your health care provider to recommend a weight-loss plan for you. Losing 5-10 lb (2.2-4.5 kg) can reduce your risk of diabetes, atherosclerosis, and high blood pressure.  Exercise for 30?60 minutes on most days, or as much as told by your health care provider. ? Do moderate-intensity exercise, such as brisk walking, bicycling, and water aerobics. Ask your health care provider which activities are safe for you.  Do not use any products that contain nicotine or tobacco, such as cigarettes and e-cigarettes. If you need help quitting, ask your health care provider. Why are these changes important? Making these changes lowers your risk of many diseases that can cause cerebrovascular disease and stroke. Stroke is a leading cause of death and disability. Making these changes also improves your overall health and quality of life. What can I do to lower my risk? The following factors make you more likely to develop cerebrovascular disease:  Being overweight.  Smoking.  Being physically inactive.    Eating a high-fat diet.  Having certain health conditions, such as: ? Diabetes. ? High blood pressure. ? Heart disease. ? Atherosclerosis. ? High cholesterol. ? Sickle cell disease.  Talk with your health care provider about your risk for cerebrovascular disease. Work with your health care provider to control diseases that you have that may contribute to cerebrovascular disease. Your health care provider may prescribe medicines to help prevent major causes of cerebrovascular disease. Where to find more information: Learn more about preventing cerebrovascular disease from:  National Heart, Lung, and  Blood Institute: www.nhlbi.nih.gov/health/health-topics/topics/stroke  Centers for Disease Control and Prevention: cdc.gov/stroke/about.htm  Summary  Cerebrovascular disease can lead to a stroke.  Atherosclerosis and high blood pressure are major causes of cerebrovascular disease.  Making diet and lifestyle changes can reduce your risk of cerebrovascular disease.  Work with your health care provider to get your risk factors under control to reduce your risk of cerebrovascular disease. This information is not intended to replace advice given to you by your health care provider. Make sure you discuss any questions you have with your health care provider. Document Released: 05/31/2015 Document Revised: 12/04/2015 Document Reviewed: 05/31/2015 Elsevier Interactive Patient Education  2018 Elsevier Inc.  

## 2016-10-28 NOTE — Progress Notes (Signed)
Chief Complaint: Follow up Extracranial Carotid Artery Stenosis   History of Present Illness  Jorge Nixon is a 83 y.o. male who returns for follow up s/p L CEA (Date: 03/11/13) by Dr. Bridgett Larsson.  He had 2 TIA's within a week before the left CEA as manifested by right arm tingling and weakness that resolved within a few minutes  He had a mild CVA on 06-11-16, was transferred from [facility] to [facility], discharged on 06-14-16.  He received t-PA. Mri and Mra of the head w/o contrast showed a nonhemorrhagic cortical infarct in the posterior right frontal lobe along the pre frontal gyrus.  He has no residual neurological deficits.   He states his appetite is good.   He walks a mile, five days/week.    Pt Diabetic: No Pt smoker: non-smoker  Pt meds include: Statin : yes ASA: no Other anticoagulants/antiplatelets: Xarelto since the CVA on 06-11-16   Past Medical History:  Diagnosis Date  . Anemia   . Arthritis   . BPH (benign prostatic hyperplasia)   . Carotid disease, bilateral ([place])    a. s/p L CEA in 2014. b. Duplex 05/2016: Right 40-59% ICA stenosis; Left 1-39% ICA stenosis  . CKD (chronic kidney disease), stage III   . COPD (chronic obstructive pulmonary disease) ([place])   . Diverticular disease 02/14/2011   problems swallowing  . DVT (deep venous thrombosis) ([place])   . Esophageal dysmotility   . Esophageal web   . GERD (gastroesophageal reflux disease)   . Headache(784.0)   . History of blood transfusion   . Hyperlipidemia   . Hypertension   . Hypothyroidism   . Kidney stone   . Nephrolithiasis    lithotripsy 2006  . Non-ischemic cardiomyopathy ([place])    a. Prior EF 45%, normal nuc 2012. b. EF 50-55% in 2018.  [name] PAF (paroxysmal atrial fibrillation) ([place])    a. identified on monitor 06/2016.  [name] Pneumonia    hx of - hospitalized  . Stroke ([place]) 05/2016  . TIA (transient ischemic attack)     Social History Social History  Substance Use Topics  . Smoking status: Never Smoker  .  Smokeless tobacco: Never Used  . Alcohol use No    Family History Family History  Problem Relation Age of Onset  . Stroke Father   . Heart attack Father   . Hypertension Mother   . Hypertension Unknown   . Diabetes Unknown     Surgical History Past Surgical History:  Procedure Laterality Date  . CAROTID ENDARTERECTOMY Left 03-11-13   cea  . carpel tunnel Right   . ENDARTERECTOMY Left 03/11/2013   Procedure: ENDARTERECTOMY CAROTID with Vascu-Guard Bovine Patch.;  Surgeon: Conrad Madaket, MD;  Location: [facility];  Service: Vascular;  Laterality: Left;  . EP IMPLANTABLE DEVICE N/A 06/14/2016   Procedure: Loop Recorder Insertion;  Surgeon: Will Meredith Leeds, MD;  Location: [facility];  Service: Cardiovascular;  Laterality: N/A;  . JOINT REPLACEMENT Right    hip  . JOINT REPLACEMENT Left    knee  . KNEE ARTHROSCOPY Right 2010  . LOOP RECORDER REMOVAL N/A 08/23/2016   Procedure: Loop Recorder Removal;  Surgeon: Will Meredith Leeds, MD;  Location: [facility];  Service: Cardiovascular;  Laterality: N/A;  . REPLACEMENT TOTAL KNEE Left 2006   left knee  . TOTAL HIP ARTHROPLASTY Right august 6th 2012   right side   . TRANSURETHRAL RESECTION OF PROSTATE  1996    Allergies  Allergen Reactions  . Sulfa Antibiotics Rash  . Milk-Related Compounds Other (See Comments)    Upset stomach    Current Outpatient Prescriptions  Medication Sig Dispense Refill  . acetaminophen (TYLENOL) 500 MG tablet Take 500 mg by mouth daily as needed for headache.    [name] atorvastatin (LIPITOR) 20 MG tablet Take 1 tablet (20 mg total) by mouth daily at 6 PM. 90 tablet 3  . levothyroxine (SYNTHROID, LEVOTHROID) 88 MCG tablet Take 88 mcg by mouth daily.     [name] losartan-hydrochlorothiazide (HYZAAR) 50-12.5 MG tablet Take 0.5 tablets by mouth daily. 45 tablet 3  . Nutritional Supplements (ENSURE COMPLETE PO) Take 237 mLs by mouth See admin instructions. Drink one can (237 mls) by mouth every other  night    . Omega-3 Fatty Acids (FISH OIL) 1200 MG CAPS Take 1,200 mg by mouth daily.    . Psyllium (METAMUCIL PO) Take 2 scoop by mouth at bedtime. Mix in liquid and drink    . Rivaroxaban (XARELTO) 15 MG TABS tablet Take 1 tablet (15 mg total) by mouth daily with supper. 90 tablet 3  . senna (SENOKOT) 8.6 MG TABS tablet Take 1 tablet by mouth at bedtime.     No current facility-administered medications for this visit.     Review of Systems : See HPI for pertinent positives and negatives.  Physical Examination  Vitals:   10/28/16 1139 10/28/16 1142  BP: (!) 156/84 (!) 157/69  Pulse: (!) 54 (!) 54  Resp: 18   SpO2: 98%   Weight: 136 lb 5 oz (61.8 kg)   Height: 5\' 10"  (1.778 m)    Body mass index is 19.56 kg/m.  General: WDWN elderly male in NAD GAIT: using quad cane, slow, deliberate Eyes: PERRLA Pulmonary: Respirations are non-labored, CTAB, adequate air movement.   Cardiac: Regular rhythm, bradycardic, no detected murmur.  VASCULAR EXAM Carotid Bruits Left Right   Negative Negative   Aorta is not palpable. Radial pulses are are 2+ palpable and =.      LE Pulses LEFT RIGHT   POPLITEAL not palpable  not palpable   POSTERIOR TIBIAL not palpable  not palpable    DORSALIS PEDIS  ANTERIOR TIBIAL 1+ palpable  1+ palpable     Gastrointestinal: soft, nontender, BS WNL, no r/g,no palpable masses.  Musculoskeletal: No muscle atrophy/wasting. M/S 4/5 in UE's, 3/5 in LE's, Extremities without ischemic changes.  Neurologic: A&O X 3; Appropriate Affect, speech is normal, CN 2-12 intactexcept has significant hearing loss, Pain and light touch intact in extremities, Motor exam as listed above     Assessment: Jorge STEWARD is a 83 y.o. male who is s/p L CEA (Date: 03/11/13); during  surgery it was found that he had carotid stenosis >70%. He had 2 TIA's within a week before the left CEA as manifested by right arm tingling and weakness that resolved within a few minutes. He had a cryptogenic CVA on 06-11-16, treated with t-PA at [facility]. He has no residual neurological deficit.   Fortunately he does not have DM, has never used tobacco, and remains physically active. He was started on Xarelto after the January 2018 CVA, and a statin was also started at that time.   DATA:  Carotid Duplex (10/28/16):  40-59%  right internal carotid artery stenosis. Patent left carotid endarterectomy site with no evidence of restenosis.  There is smooth, homogenous plaque at the distal end of the endarterectomy site Bilateral vertebral artery flow is antegrade.  Bilateral subclavian artery  waveforms are normal.  Slight increased stenosis of the right ICA compared to the last exam on 10-23-15.   Ct Head Wo Contrast 06/12/2016 1. Acute nonhemorrhagic cortical infarct within the right precentral gyrus. 2. No additional infarcts. 3. Chronic atrophy and white matter disease is otherwise stable.  4. Atherosclerosis.   Mri andMra headWo Contrast 06/12/2016 1. Acute nonhemorrhagic cortical infarct in the posterior right frontal lobe along the pre frontal gyrus. 2. Additional cortical infarcts slightly more anterior in the right frontal lobe. 3. Moderate diffuse periventricular and subcortical white matter disease bilaterally suggesting extensive small vessel disease. 4. MRA circle of Willis is moderately degraded by patient motion but suggests diffuse small vessel disease. 5. Mild-to-moderate narrowing of the distal left M1 segment and proximal right PICA. 6. Moderate narrowing of the proximal right P2 segment. 7. Mild atherosclerotic narrowing of the proximal internal carotid artery bilaterally in the neck without focal stenosis.    Plan: Follow-up in 1 year with Carotid Duplex scan.    I  discussed in depth with the patient the nature of atherosclerosis, and emphasized the importance of maximal medical management including strict control of blood pressure, blood glucose, and lipid levels, obtaining regular exercise, and continued cessation of smoking.  The patient is aware that without maximal medical management the underlying atherosclerotic disease process will progress, limiting the benefit of any interventions. The patient was given information about stroke prevention and what symptoms should prompt the patient to seek immediate medical care. Thank you for allowing [place] to participate in this patient's care.  Clemon Chambers, RN, MSN, FNP-C [facility] Office: [phone]  Clinic Physician: Chen/Cain  10/28/16 11:45 AM

## 2016-11-03 ENCOUNTER — Telehealth: Payer: Self-pay | Admitting: Orthopedic Surgery

## 2016-11-03 NOTE — Telephone Encounter (Signed)
Patient/wife called to inquire about an appointment with Dr Aline Brochure, as a 2nd opinion for hip pain. States had total hip replacement surgery about 3 years ago, in [place], by one of the surgeons who comes to that office from [facility].  Discussed protocol for second opinion evaluations, including obtaining operative notes, Xray reports, and Xray films/CD.  Patient said will try to get this information and will call back.  States had some difficulty finding our phone number. We reviewed all our updated information; voiced understanding.

## 2016-11-17 NOTE — Addendum Note (Signed)
Addended by: Lianne Cure A on: 11/17/2016 09:52 AM   Modules accepted: Orders

## 2017-03-03 ENCOUNTER — Emergency Department (HOSPITAL_COMMUNITY): Payer: Medicare Other

## 2017-03-03 ENCOUNTER — Emergency Department (HOSPITAL_COMMUNITY)
Admission: EM | Admit: 2017-03-03 | Discharge: 2017-03-03 | Disposition: A | Discharge status: Home / Self Care | Payer: Medicare Other | Attending: Emergency Medicine | Admitting: Emergency Medicine

## 2017-03-03 ENCOUNTER — Encounter (HOSPITAL_COMMUNITY): Payer: Self-pay | Admitting: Emergency Medicine

## 2017-03-03 DIAGNOSIS — L03116 Cellulitis of left lower limb: Secondary | ICD-10-CM | POA: Diagnosis not present

## 2017-03-03 DIAGNOSIS — Z79899 Other long term (current) drug therapy: Secondary | ICD-10-CM | POA: Insufficient documentation

## 2017-03-03 DIAGNOSIS — Z96652 Presence of left artificial knee joint: Secondary | ICD-10-CM | POA: Diagnosis not present

## 2017-03-03 DIAGNOSIS — Z7901 Long term (current) use of anticoagulants: Secondary | ICD-10-CM | POA: Insufficient documentation

## 2017-03-03 DIAGNOSIS — Z23 Encounter for immunization: Secondary | ICD-10-CM | POA: Insufficient documentation

## 2017-03-03 DIAGNOSIS — Z96641 Presence of right artificial hip joint: Secondary | ICD-10-CM | POA: Diagnosis not present

## 2017-03-03 DIAGNOSIS — J449 Chronic obstructive pulmonary disease, unspecified: Secondary | ICD-10-CM | POA: Insufficient documentation

## 2017-03-03 DIAGNOSIS — E039 Hypothyroidism, unspecified: Secondary | ICD-10-CM | POA: Insufficient documentation

## 2017-03-03 DIAGNOSIS — N183 Chronic kidney disease, stage 3 (moderate): Secondary | ICD-10-CM | POA: Diagnosis not present

## 2017-03-03 DIAGNOSIS — M79605 Pain in left leg: Secondary | ICD-10-CM | POA: Diagnosis present

## 2017-03-03 DIAGNOSIS — I129 Hypertensive chronic kidney disease with stage 1 through stage 4 chronic kidney disease, or unspecified chronic kidney disease: Secondary | ICD-10-CM | POA: Diagnosis not present

## 2017-03-03 MED ORDER — TETANUS-DIPHTH-ACELL PERTUSSIS 5-2.5-18.5 LF-MCG/0.5 IM SUSP
0.5000 mL | Freq: Once | INTRAMUSCULAR | Status: AC
  Administered 2017-03-03: 0.5 mL via INTRAMUSCULAR
  Filled 2017-03-03: qty 0.5

## 2017-03-03 MED ORDER — CLINDAMYCIN HCL 150 MG PO CAPS
150.0000 mg | ORAL_CAPSULE | Freq: Once | ORAL | Status: AC
  Administered 2017-03-03: 150 mg via ORAL
  Filled 2017-03-03: qty 1

## 2017-03-03 MED ORDER — CLINDAMYCIN HCL 150 MG PO CAPS: 150.0000 mg | ORAL_CAPSULE | Freq: Four times a day (QID) | ORAL | 0 refills | Status: DC

## 2017-03-03 NOTE — ED Provider Notes (Signed)
The patient is a 83 year old male who fell 2 days ago and had a cinderblock injury his left lower extremity on the medial lower aspect of the lower leg. There is now a slight injury to the skin with some spreading redness. He otherwise appears well.  Treat for early cellulitis, local wound care, stable for discharge.  Medical screening examination/treatment/procedure(s) were conducted as a shared visit with non-physician practitioner(s) and myself.  I personally evaluated the patient during the encounter.  Clinical Impression:   Final diagnoses:  None         Noemi Chapel, MD 03/03/17 [phone]

## 2017-03-03 NOTE — ED Triage Notes (Signed)
Patient c/o bilaterally lower leg pain after falling on Wednesday. Patient states that he tripped stepping on cinder block. Patient states fell forward and hit left shin against the cinder block. Small  Abrasion noted. Patient states that he had hurt his right lower leg prior and was seen by PCP, told it was a sprain but didn't have any x-rays. Patient states right lower leg still "hurts." Patient ambulatory. No obvious deformity. Patient unsure of last tetanus vaccination.

## 2017-03-03 NOTE — ED Provider Notes (Signed)
[facility] DEPT Provider Note   CSN: [id number] Arrival date & time: 03/03/17  1703     History   Chief Complaint Chief Complaint  Patient presents with  . Leg Pain    HPI Jorge Nixon is a 83 y.o. male.  HPI   Jorge Nixon is a 83 y.o. male who presents to the Emergency Department complaining of sharp shooting pain tot he left lower leg and abrasion for two days.  States that pain began after he tripped over a concrete block and struck his lower leg on the block. Pt's son cleaned the wound with tap water and applied neosporin. Noticed pain associated with weight bearing and mild swelling to the medial lower leg. He also reports having pain to the right ankle for some time and was seen early today by his PCP for this, but the left leg was not mentioned.   Taking tylenol with minimal relief.  He denies other injuries from the fall, numbness or weakness of the extremities, knee or hip pain, head injury or LOC.  Pt is anti-coagulated on Xarelto.    Past Medical History:  Diagnosis Date  . Anemia   . Arthritis   . BPH (benign prostatic hyperplasia)   . Carotid disease, bilateral ([place])    a. s/p L CEA in 2014. b. Duplex 05/2016: Right 40-59% ICA stenosis; Left 1-39% ICA stenosis  . CKD (chronic kidney disease), stage III ([place])   . COPD (chronic obstructive pulmonary disease) ([place])   . Diverticular disease 02/14/2011   problems swallowing  . DVT (deep venous thrombosis) ([place])   . Esophageal dysmotility   . Esophageal web   . GERD (gastroesophageal reflux disease)   . Headache(784.0)   . History of blood transfusion   . Hyperlipidemia   . Hypertension   . Hypothyroidism   . Kidney stone   . Nephrolithiasis    lithotripsy 2006  . Non-ischemic cardiomyopathy ([place])    a. Prior EF 45%, normal nuc 2012. b. EF 50-55% in 2018.  [name] PAF (paroxysmal atrial fibrillation) ([place])    a. identified on monitor 06/2016.  [name] Pneumonia    hx of - hospitalized  . Stroke ([place]) 05/2016  . TIA  (transient ischemic attack)     Patient Active Problem List   Diagnosis Date Noted  . Carotid stenosis R 06/14/2016  . History of CEA (carotid endarterectomy)   . History of stroke   . Hyperlipidemia   . CVA (cerebral vascular accident) ([place]) - sm R cortical cryptogenic infarct 06/11/2016  . Aftercare following surgery of the circulatory system, [place] 10/11/2013  . CVA (cerebral infarction) 02/23/2013  . Occlusion and stenosis of carotid artery with cerebral infarction 02/23/2013  . TIA (transient ischemic attack) 02/22/2013  . Right arm weakness 02/22/2013  . Hypertension 02/22/2013  . Esophageal dysmotility 02/24/2011  . Esophageal web 02/24/2011  . Mouth pain 02/24/2011  . Acute renal failure ([place]) 02/16/2011  . Hypokalemia 02/13/2011  . Protein-calorie malnutrition, severe ([place]) 02/13/2011  . PNA (pneumonia) 02/10/2011  . Dysphagia 02/10/2011  . Anemia 02/10/2011  . Weakness generalized 02/10/2011  . S/P hip replacement right 02/10/2011    Past Surgical History:  Procedure Laterality Date  . CAROTID ENDARTERECTOMY Left 03-11-13   cea  . carpel tunnel Right   . ENDARTERECTOMY Left 03/11/2013   Procedure: ENDARTERECTOMY CAROTID with Vascu-Guard Bovine Patch.;  Surgeon: Conrad [place], MD;  Location: [facility];  Service: Vascular;  Laterality: Left;  . EP IMPLANTABLE DEVICE N/A  06/14/2016   Procedure: Loop Recorder Insertion;  Surgeon: Will Meredith Leeds, MD;  Location: [facility];  Service: Cardiovascular;  Laterality: N/A;  . JOINT REPLACEMENT Right    hip  . JOINT REPLACEMENT Left    knee  . KNEE ARTHROSCOPY Right 2010  . LOOP RECORDER REMOVAL N/A 08/23/2016   Procedure: Loop Recorder Removal;  Surgeon: Will Meredith Leeds, MD;  Location: [facility];  Service: Cardiovascular;  Laterality: N/A;  . REPLACEMENT TOTAL KNEE Left 2006   left knee  . TOTAL HIP ARTHROPLASTY Right august 6th 2012   right side   . TRANSURETHRAL RESECTION OF PROSTATE  1996        Home Medications    Prior to Admission medications   Medication Sig Start Date End Date Taking? Authorizing Provider  acetaminophen (TYLENOL) 500 MG tablet Take 500 mg by mouth daily as needed for headache.    [provider]  atorvastatin (LIPITOR) 20 MG tablet Take 1 tablet (20 mg total) by mouth daily at 6 PM. 09/26/16   Lendon Colonel, NP  levothyroxine (SYNTHROID, LEVOTHROID) 88 MCG tablet Take 88 mcg by mouth daily.  05/29/16   [provider]  losartan-hydrochlorothiazide (HYZAAR) 50-12.5 MG tablet Take 0.5 tablets by mouth daily. 09/15/16   Lendon Colonel, NP  Nutritional Supplements (ENSURE COMPLETE PO) Take 237 mLs by mouth See admin instructions. Drink one can (237 mls) by mouth every other night    [provider]  Omega-3 Fatty Acids (FISH OIL) 1200 MG CAPS Take 1,200 mg by mouth daily.    [provider]  Psyllium (METAMUCIL PO) Take 2 scoop by mouth at bedtime. Mix in liquid and drink    [provider]  Rivaroxaban (XARELTO) 15 MG TABS tablet Take 1 tablet (15 mg total) by mouth daily with supper. 09/21/16   Lendon Colonel, NP  senna (SENOKOT) 8.6 MG TABS tablet Take 1 tablet by mouth at bedtime.    [provider]    Family History Family History  Problem Relation Age of Onset  . Stroke Father   . Heart attack Father   . Hypertension Mother   . Hypertension Unknown   . Diabetes Unknown     Social History Social History  Substance Use Topics  . Smoking status: Never Smoker  . Smokeless tobacco: Never Used  . Alcohol use No     Allergies   Sulfa antibiotics and Milk-related compounds   Review of Systems Review of Systems  Constitutional: Negative for chills and fever.  Cardiovascular: Negative for chest pain.  Musculoskeletal: Positive for arthralgias (left lower leg and right ankle pain). Negative for joint swelling.  Skin: Negative for color change and wound (abrasion left lower  leg).  Neurological: Negative for dizziness, syncope, speech difficulty, weakness, numbness and headaches.  All other systems reviewed and are negative.    Physical Exam Updated Vital Signs BP (!) 171/75 (BP Location: Right Arm)   Pulse 65   Temp 98.3 F (36.8 C) (Oral)   Resp 15   Ht 5\' 11"  (1.803 m)   Wt 59 kg (130 lb)   SpO2 98%   BMI 18.13 kg/m   Physical Exam  Constitutional: He is oriented to person, place, and time. He appears well-developed and well-nourished. No distress.  HENT:  Head: Normocephalic and atraumatic.  Cardiovascular: Normal rate, regular rhythm and intact distal pulses.   Pulmonary/Chest: Effort normal and breath sounds normal.  Musculoskeletal: He exhibits tenderness.  ttp of the lateral right ankle and medial aspect of the left lower leg.  local area of ecchymosis and abrasion with mild surrounding erythema, warmth and edema.  no lymphangintis.  Calf is soft and non-tender  No bony deformity  Neurological: He is alert and oriented to person, place, and time. No sensory deficit. He exhibits normal muscle tone. Coordination normal.  Skin: Skin is warm and dry. Capillary refill takes less than 2 seconds.  Nursing note and vitals reviewed.    ED Treatments / Results  Labs (all labs ordered are listed, but only abnormal results are displayed) Labs Reviewed - No data to display  EKG  EKG Interpretation None       Radiology Dg Tibia/fibula Left  Result Date: 03/03/2017 CLINICAL DATA:  83 y/o M; fall 2 days ago with left tibia and fibula pain, abrasion of medial side of left leg approximately 2/3 distal, and right ankle pain. EXAM: LEFT TIBIA AND FIBULA - 2 VIEW COMPARISON:  None. FINDINGS: Total left knee replacement without periprosthetic lucency or fracture. No acute fracture or dislocation of the tibia or fibula. Vascular calcifications noted. IMPRESSION: No acute fracture or dislocation of the tibia and fibula. Total knee replacement without  apparent hardware related complication. Electronically Signed   By: Kristine Garbe M.D.   On: 03/03/2017 18:52   Dg Ankle Complete Right  Result Date: 03/03/2017 CLINICAL DATA:  83 y/o  M; status post fall with right ankle pain. EXAM: RIGHT ANKLE - COMPLETE 3+ VIEW COMPARISON:  None. FINDINGS: There is no evidence of fracture, dislocation, or joint effusion. Dorsal calcaneal enthesophytes. Ankle mortise is symmetric on these nonstress views. Talar dome is intact. IMPRESSION: Negative. Electronically Signed   By: Kristine Garbe M.D.   On: 03/03/2017 18:53    Procedures Procedures (including critical care time)  Medications Ordered in ED Medications  Tdap (BOOSTRIX) injection 0.5 mL (not administered)  clindamycin (CLEOCIN) capsule 150 mg (not administered)     Initial Impression / Assessment and Plan / ED Course  I have reviewed the triage vital signs and the nursing notes.  Pertinent labs & imaging results that were available during my care of the patient were reviewed by me and considered in my medical decision making (see chart for details).     Pt also seen by Dr. Hazle Nordmann.    XR's neg for bony injury.  NV intact.  Ambulates with steady gait. Likely early cellulitis of the left lower leg, no abscess.  Doubtful of DVT,the patient is anti-coagulated.   Pt agrees to warm water soaks, abx, close f/u with PCP.  Return precautions discussed.   Wound cleaned and TD updated.    Final Clinical Impressions(s) / ED Diagnoses   Final diagnoses:  Cellulitis of left lower extremity    New Prescriptions New Prescriptions   No medications on file     Kem Parkinson, PA-C 03/04/17 0009    Kem Parkinson, PA-C 03/04/17 Pasty Arch, MD 03/04/17 [phone]

## 2017-03-03 NOTE — Discharge Instructions (Signed)
Elevate the left leg when possible.  Warm water soaks 2-3 times a day to the left leg.  Ice packs on/off to the right ankle.  Tylenol every 4 hrs for pain.  Follow-up with Dr. Juel Burrow office next week for recheck.  Return here in 2-3 days for any worsening symptoms such as increasing pain, redness or more swelling.

## 2017-03-14 NOTE — Progress Notes (Signed)
Cardiology Office Note   Date:  03/16/2017   ID:  Casy, Brunetto 1924/12/03, MRN [id number]  PCP:  Celene Squibb, MD  Cardiologist:  Allegra Lai, MD Chief Complaint  Patient presents with  . Atrial Fibrillation  . Hypertension      History of Present Illness: PERETZ THIEME is a 82 y.o. male who presents for ongoing assessment and management of atrial fibrillation on Xarelto, with hx of cryptogenic CVA, NICM, hypertension, carotid artery disease, s/p left CEA, with other history to include COPD, CKD, and hx of DVT.  He was last seen in the office on 09/15/2016 and was doing well.   He was seen in the ER on 03/03/2017 with complaints of sharp shooting pain of the left lower leg and abrasion. He was not found to have a fracture or infection. He was treated with antibiotic therapy for cellulitis.[name] He was continued on Xarelto, and prescribed warm water soaks.   He comes today feeling much better no pain in his leg, no complaints of palpitations, chest pain, or excessive dyspnea. He continues to walk a mile a day. He states he is a little fatigued when he comes home that he offers no significant complaints of chest pressure or dyspnea associated with this. He does use a cane for ambulation.   Past Medical History:  Diagnosis Date  . Anemia   . Arthritis   . BPH (benign prostatic hyperplasia)   . Carotid disease, bilateral ([place])    a. s/p L CEA in 2014. b. Duplex 05/2016: Right 40-59% ICA stenosis; Left 1-39% ICA stenosis  . CKD (chronic kidney disease), stage III ([place])   . COPD (chronic obstructive pulmonary disease) ([place])   . Diverticular disease 02/14/2011   problems swallowing  . DVT (deep venous thrombosis) ([place])   . Esophageal dysmotility   . Esophageal web   . GERD (gastroesophageal reflux disease)   . Headache(784.0)   . History of blood transfusion   . Hyperlipidemia   . Hypertension   . Hypothyroidism   . Kidney stone   . Nephrolithiasis    lithotripsy 2006  . Non-ischemic  cardiomyopathy ([place])    a. Prior EF 45%, normal nuc 2012. b. EF 50-55% in 2018.  [name] PAF (paroxysmal atrial fibrillation) ([place])    a. identified on monitor 06/2016.  [name] Pneumonia    hx of - hospitalized  . Stroke ([place]) 05/2016  . TIA (transient ischemic attack)     Past Surgical History:  Procedure Laterality Date  . CAROTID ENDARTERECTOMY Left 03-11-13   cea  . carpel tunnel Right   . ENDARTERECTOMY Left 03/11/2013   Procedure: ENDARTERECTOMY CAROTID with Vascu-Guard Bovine Patch.;  Surgeon: Conrad Denver, MD;  Location: [facility];  Service: Vascular;  Laterality: Left;  . EP IMPLANTABLE DEVICE N/A 06/14/2016   Procedure: Loop Recorder Insertion;  Surgeon: Will Meredith Leeds, MD;  Location: [facility];  Service: Cardiovascular;  Laterality: N/A;  . JOINT REPLACEMENT Right    hip  . JOINT REPLACEMENT Left    knee  . KNEE ARTHROSCOPY Right 2010  . LOOP RECORDER REMOVAL N/A 08/23/2016   Procedure: Loop Recorder Removal;  Surgeon: Will Meredith Leeds, MD;  Location: [facility];  Service: Cardiovascular;  Laterality: N/A;  . REPLACEMENT TOTAL KNEE Left 2006   left knee  . TOTAL HIP ARTHROPLASTY Right august 6th 2012   right side   . TRANSURETHRAL RESECTION OF PROSTATE  1996     Current  Outpatient Prescriptions  Medication Sig Dispense Refill  . acetaminophen (TYLENOL) 500 MG tablet Take 500 mg by mouth daily as needed for headache.    [name] atorvastatin (LIPITOR) 20 MG tablet Take 1 tablet (20 mg total) by mouth daily at 6 PM. 90 tablet 3  . levothyroxine (SYNTHROID, LEVOTHROID) 88 MCG tablet Take 88 mcg by mouth daily.     [name] losartan-hydrochlorothiazide (HYZAAR) 50-12.5 MG tablet Take 0.5 tablets by mouth daily. 45 tablet 3  . Nutritional Supplements (ENSURE COMPLETE PO) Take 237 mLs by mouth See admin instructions. Drink one can (237 mls) by mouth every other night    . Psyllium (METAMUCIL PO) Take 2 scoop by mouth at bedtime. Mix in liquid and drink    . Rivaroxaban  (XARELTO) 15 MG TABS tablet Take 1 tablet (15 mg total) by mouth daily with supper. 90 tablet 3  . senna (SENOKOT) 8.6 MG TABS tablet Take 1 tablet by mouth at bedtime.     No current facility-administered medications for this visit.     Allergies:   Sulfa antibiotics and Milk-related compounds    Social History:  The patient  reports that he has never smoked. He has never used smokeless tobacco. He reports that he does not drink alcohol or use drugs.   Family History:  The patient's family history includes Diabetes in his unknown relative; Heart attack in his father; Hypertension in his mother and unknown relative; Stroke in his father.    ROS: All other systems are reviewed and negative. Unless otherwise mentioned in H&P    PHYSICAL EXAM: VS:  BP 116/60   Pulse 65   Wt 135 lb (61.2 kg)   SpO2 97%   BMI 18.83 kg/m  , BMI Body mass index is 18.83 kg/m. GEN: Well nourished, well developed, in no acute distress  HEENT: normal  Neck: no JVD, carotid bruits, or masses Cardiac:RRR; no murmurs, rubs, or gallops,no edema  Respiratory:  clear to auscultation bilaterally, normal work of breathing GI: soft, nontender, nondistended, + BS MS: no deformity or atrophy Well healed laceration on the anterior aspect of his lower left pretibial area. No heat, or erythema. Skin: warm and dry, no rash Neuro:  Strength and sensation are intact. Hard of hearing Psych: euthymic mood, full affect   Recent Labs: 06/11/2016: ALT 15; BUN 22; Creatinine, Ser 1.20; Hemoglobin 13.6; Platelets 140; Potassium 3.8; Sodium 142    Lipid Panel    Component Value Date/Time   CHOL 167 06/12/2016 0322   TRIG 105 06/12/2016 0322   HDL 29 (L) 06/12/2016 0322   CHOLHDL 5.8 06/12/2016 0322   VLDL 21 06/12/2016 0322   LDLCALC 117 (H) 06/12/2016 0322      Wt Readings from Last 3 Encounters:  03/16/17 135 lb (61.2 kg)  03/03/17 130 lb (59 kg)  10/28/16 136 lb 5 oz (61.8 kg)      Other studies  Reviewed: Echocardiogram: [id number] Left ventricle: The cavity size was normal. Wall thickness was   increased in a pattern of mild LVH. Systolic function was normal.   The estimated ejection fraction was in the range of 50% to 55%.   Left ventricular diastolic function parameters were normal. - Aortic valve: There was mild regurgitation. - Mitral valve: Calcified annulus. Mildly thickened leaflets .   There was mild regurgitation.  ASSESSMENT AND PLAN:  1.  Paroxysmal atrial fibrillation: He remains in regular rhythm today, he is not on any rate control medications at this time. He continues  on Xarelto for DVT and A. Fib, CVA protection. Will not make any changes in his medication regimen at this time as he is doing so well. We'll see him again in 6 months  2. Hypertension: Blood pressure has excellent control with losartan HCTZ. If he becomes dizzy or lightheaded would discontinue HCTZ portion of medications, to avoid hypotension or dehydration in this elderly gentleman.  3. Hypercholesterolemia: He is compliant with atorvastatin 20 mg at at bedtime. No changes in his regimen. We'll seem again in 6 months with follow-up labs unless completed by primary care.  Current medicines are reviewed at length with the patient today.    Labs/ tests ordered today include: None   Phill Myron. West Pugh, ANP, AACC   03/16/2017 1:22 PM    [facility] [address] [place], [place] [address] Phone: [phone]; Fax: [phone]

## 2017-03-16 ENCOUNTER — Ambulatory Visit (INDEPENDENT_AMBULATORY_CARE_PROVIDER_SITE_OTHER): Payer: Medicare Other | Admitting: Adult Health

## 2017-03-16 ENCOUNTER — Encounter: Payer: Self-pay | Admitting: Adult Health

## 2017-03-16 VITALS — BP 116/60 | HR 65 | Wt 135.0 lb

## 2017-03-16 DIAGNOSIS — I480 Paroxysmal atrial fibrillation: Secondary | ICD-10-CM | POA: Diagnosis not present

## 2017-03-16 DIAGNOSIS — I10 Essential (primary) hypertension: Secondary | ICD-10-CM | POA: Diagnosis not present

## 2017-03-16 DIAGNOSIS — E7800 Pure hypercholesterolemia, unspecified: Secondary | ICD-10-CM | POA: Diagnosis not present

## 2017-03-16 NOTE — Patient Instructions (Signed)
Medication Instructions:  Your physician recommends that you continue on your current medications as directed. Please refer to the Current Medication list given to you today.   Labwork: NONE   Testing/Procedures: NONE   Follow-Up: Your physician wants you to follow-up in: 6 Month. You will receive a reminder letter in the mail two months in advance. If you don't receive a letter, please call our office to schedule the follow-up appointment.   Any Other Special Instructions Will Be Listed Below (If Applicable).     If you need a refill on your cardiac medications before your next appointment, please call your pharmacy. Thank you for choosing [facility]!

## 2017-05-18 ENCOUNTER — Encounter (HOSPITAL_COMMUNITY): Payer: Self-pay | Admitting: Emergency Medicine

## 2017-05-18 ENCOUNTER — Other Ambulatory Visit: Payer: Self-pay

## 2017-05-18 ENCOUNTER — Observation Stay (HOSPITAL_COMMUNITY)
Admission: EM | Admit: 2017-05-18 | Discharge: 2017-05-20 | Disposition: A | Discharge status: Home / Self Care | Payer: Medicare Other | Attending: Internal Medicine | Admitting: Internal Medicine

## 2017-05-18 ENCOUNTER — Emergency Department (HOSPITAL_COMMUNITY): Payer: Medicare Other

## 2017-05-18 ENCOUNTER — Observation Stay (HOSPITAL_BASED_OUTPATIENT_CLINIC_OR_DEPARTMENT_OTHER): Payer: Medicare Other

## 2017-05-18 DIAGNOSIS — I480 Paroxysmal atrial fibrillation: Secondary | ICD-10-CM | POA: Diagnosis not present

## 2017-05-18 DIAGNOSIS — J449 Chronic obstructive pulmonary disease, unspecified: Secondary | ICD-10-CM | POA: Insufficient documentation

## 2017-05-18 DIAGNOSIS — J029 Acute pharyngitis, unspecified: Secondary | ICD-10-CM | POA: Insufficient documentation

## 2017-05-18 DIAGNOSIS — R0789 Other chest pain: Secondary | ICD-10-CM | POA: Diagnosis not present

## 2017-05-18 DIAGNOSIS — I701 Atherosclerosis of renal artery: Secondary | ICD-10-CM | POA: Diagnosis not present

## 2017-05-18 DIAGNOSIS — D631 Anemia in chronic kidney disease: Secondary | ICD-10-CM | POA: Diagnosis not present

## 2017-05-18 DIAGNOSIS — M545 Low back pain: Secondary | ICD-10-CM | POA: Insufficient documentation

## 2017-05-18 DIAGNOSIS — K5900 Constipation, unspecified: Secondary | ICD-10-CM

## 2017-05-18 DIAGNOSIS — K921 Melena: Secondary | ICD-10-CM

## 2017-05-18 DIAGNOSIS — Z8673 Personal history of transient ischemic attack (TIA), and cerebral infarction without residual deficits: Secondary | ICD-10-CM | POA: Insufficient documentation

## 2017-05-18 DIAGNOSIS — Z79899 Other long term (current) drug therapy: Secondary | ICD-10-CM | POA: Insufficient documentation

## 2017-05-18 DIAGNOSIS — R072 Precordial pain: Secondary | ICD-10-CM

## 2017-05-18 DIAGNOSIS — I4891 Unspecified atrial fibrillation: Secondary | ICD-10-CM | POA: Diagnosis not present

## 2017-05-18 DIAGNOSIS — N183 Chronic kidney disease, stage 3 unspecified: Secondary | ICD-10-CM

## 2017-05-18 DIAGNOSIS — R42 Dizziness and giddiness: Secondary | ICD-10-CM | POA: Insufficient documentation

## 2017-05-18 DIAGNOSIS — I712 Thoracic aortic aneurysm, without rupture, unspecified: Secondary | ICD-10-CM

## 2017-05-18 DIAGNOSIS — I129 Hypertensive chronic kidney disease with stage 1 through stage 4 chronic kidney disease, or unspecified chronic kidney disease: Secondary | ICD-10-CM | POA: Diagnosis not present

## 2017-05-18 DIAGNOSIS — Z7901 Long term (current) use of anticoagulants: Secondary | ICD-10-CM | POA: Insufficient documentation

## 2017-05-18 DIAGNOSIS — K5909 Other constipation: Secondary | ICD-10-CM | POA: Insufficient documentation

## 2017-05-18 DIAGNOSIS — E039 Hypothyroidism, unspecified: Secondary | ICD-10-CM | POA: Insufficient documentation

## 2017-05-18 DIAGNOSIS — I714 Abdominal aortic aneurysm, without rupture, unspecified: Secondary | ICD-10-CM

## 2017-05-18 DIAGNOSIS — R079 Chest pain, unspecified: Secondary | ICD-10-CM | POA: Diagnosis present

## 2017-05-18 DIAGNOSIS — D696 Thrombocytopenia, unspecified: Secondary | ICD-10-CM | POA: Diagnosis not present

## 2017-05-18 LAB — CBC WITH DIFFERENTIAL/PLATELET
Basophils Absolute: 0 10*3/uL (ref 0.0–0.1)
Basophils Relative: 0 %
EOS PCT: 1 %
Eosinophils Absolute: 0.1 10*3/uL (ref 0.0–0.7)
HEMATOCRIT: 38 % — AB (ref 39.0–52.0)
Hemoglobin: 12.1 g/dL — ABNORMAL LOW (ref 13.0–17.0)
LYMPHS PCT: 12 %
Lymphs Abs: 1.1 10*3/uL (ref 0.7–4.0)
MCH: 29.3 pg (ref 26.0–34.0)
MCHC: 31.8 g/dL (ref 30.0–36.0)
MCV: 92 fL (ref 78.0–100.0)
MONO ABS: 0.9 10*3/uL (ref 0.1–1.0)
MONOS PCT: 10 %
NEUTROS ABS: 6.7 10*3/uL (ref 1.7–7.7)
Neutrophils Relative %: 77 %
PLATELETS: 143 10*3/uL — AB (ref 150–400)
RBC: 4.13 MIL/uL — ABNORMAL LOW (ref 4.22–5.81)
RDW: 14.8 % (ref 11.5–15.5)
WBC: 8.8 10*3/uL (ref 4.0–10.5)

## 2017-05-18 LAB — BASIC METABOLIC PANEL
Anion gap: 12 (ref 5–15)
BUN: 19 mg/dL (ref 6–20)
CALCIUM: 9.1 mg/dL (ref 8.9–10.3)
CHLORIDE: 101 mmol/L (ref 101–111)
CO2: 25 mmol/L (ref 22–32)
CREATININE: 1.09 mg/dL (ref 0.61–1.24)
GFR calc non Af Amer: 58 mL/min — ABNORMAL LOW (ref >60)
Glucose, Bld: 102 mg/dL — ABNORMAL HIGH (ref 65–99)
Potassium: 3.8 mmol/L (ref 3.5–5.1)
SODIUM: 138 mmol/L (ref 135–145)

## 2017-05-18 LAB — TROPONIN I
Troponin I: <0.03 ng/mL (ref <0.03)
Troponin I: <0.03 ng/mL (ref <0.03)

## 2017-05-18 LAB — APTT: aPTT: 35 seconds (ref 24–36)

## 2017-05-18 LAB — PROTIME-INR
INR: 1.76
Prothrombin Time: 20.3 seconds — ABNORMAL HIGH (ref 11.4–15.2)

## 2017-05-18 LAB — ECHOCARDIOGRAM COMPLETE
Height: 70 in
WEIGHTICAEL: 2095.25 [oz_av]

## 2017-05-18 LAB — CBG MONITORING, ED: Glucose-Capillary: 83 mg/dL (ref 65–99)

## 2017-05-18 MED ORDER — SODIUM CHLORIDE 0.9 % IV BOLUS (SEPSIS)
500.0000 mL | Freq: Once | INTRAVENOUS | Status: AC
  Administered 2017-05-18: 500 mL via INTRAVENOUS

## 2017-05-18 MED ORDER — ONDANSETRON HCL 4 MG/2ML IJ SOLN: 4.0000 mg | Freq: Four times a day (QID) | INTRAMUSCULAR | Status: DC | PRN

## 2017-05-18 MED ORDER — SODIUM CHLORIDE 0.9 % IV SOLN
INTRAVENOUS | Status: DC
  Administered 2017-05-18 – 2017-05-19 (×2): via INTRAVENOUS

## 2017-05-18 MED ORDER — ACETAMINOPHEN 325 MG PO TABS
650.0000 mg | ORAL_TABLET | Freq: Four times a day (QID) | ORAL | Status: DC | PRN
  Administered 2017-05-20: 650 mg via ORAL
  Filled 2017-05-18: qty 2

## 2017-05-18 MED ORDER — RIVAROXABAN 15 MG PO TABS
15.0000 mg | ORAL_TABLET | Freq: Every day | ORAL | Status: DC
  Administered 2017-05-18: 15 mg via ORAL
  Filled 2017-05-18: qty 1

## 2017-05-18 MED ORDER — ACETAMINOPHEN 650 MG RE SUPP: 650.0000 mg | Freq: Four times a day (QID) | RECTAL | Status: DC | PRN

## 2017-05-18 MED ORDER — ENSURE ENLIVE PO LIQD: 237.0000 mL | Freq: Two times a day (BID) | ORAL | Status: DC

## 2017-05-18 MED ORDER — DOCUSATE SODIUM 100 MG PO CAPS
100.0000 mg | ORAL_CAPSULE | Freq: Every day | ORAL | Status: DC
  Administered 2017-05-18 – 2017-05-19 (×2): 100 mg via ORAL
  Filled 2017-05-18 (×2): qty 1

## 2017-05-18 MED ORDER — ASPIRIN EC 81 MG PO TBEC
81.0000 mg | DELAYED_RELEASE_TABLET | Freq: Every day | ORAL | Status: DC
  Administered 2017-05-19 – 2017-05-20 (×2): 81 mg via ORAL
  Filled 2017-05-18 (×2): qty 1

## 2017-05-18 MED ORDER — IOPAMIDOL (ISOVUE-370) INJECTION 76%
75.0000 mL | Freq: Once | INTRAVENOUS | Status: AC | PRN
  Administered 2017-05-18: 75 mL via INTRAVENOUS

## 2017-05-18 MED ORDER — PSYLLIUM 95 % PO PACK
1.0000 | PACK | Freq: Every day | ORAL | Status: DC
  Administered 2017-05-18 – 2017-05-19 (×2): 1 via ORAL
  Filled 2017-05-18 (×2): qty 1

## 2017-05-18 MED ORDER — ASPIRIN 325 MG PO TABS
325.0000 mg | ORAL_TABLET | Freq: Once | ORAL | Status: AC
  Administered 2017-05-18: 325 mg via ORAL
  Filled 2017-05-18: qty 1

## 2017-05-18 MED ORDER — LEVOTHYROXINE SODIUM 88 MCG PO TABS
88.0000 ug | ORAL_TABLET | Freq: Every day | ORAL | Status: DC
  Administered 2017-05-19 – 2017-05-20 (×2): 88 ug via ORAL
  Filled 2017-05-18 (×2): qty 1

## 2017-05-18 MED ORDER — ADULT MULTIVITAMIN W/MINERALS CH
1.0000 | ORAL_TABLET | Freq: Every day | ORAL | Status: DC
  Administered 2017-05-19 – 2017-05-20 (×2): 1 via ORAL
  Filled 2017-05-18 (×3): qty 1

## 2017-05-18 MED ORDER — ONDANSETRON HCL 4 MG PO TABS: 4.0000 mg | ORAL_TABLET | Freq: Four times a day (QID) | ORAL | Status: DC | PRN

## 2017-05-18 MED ORDER — SENNA 8.6 MG PO TABS
1.0000 | ORAL_TABLET | Freq: Every day | ORAL | Status: DC
  Administered 2017-05-18: 8.6 mg via ORAL
  Filled 2017-05-18: qty 1

## 2017-05-18 NOTE — ED Provider Notes (Signed)
[facility] EMERGENCY DEPARTMENT Provider Note   CSN: [id number] Arrival date & time: 05/18/17  0805     History   Chief Complaint Chief Complaint  Patient presents with  . Chest Pain    HPI Jorge Nixon is a 83 y.o. male.  HPI   34 YOM with a history of A. fib (on Xarelto), COPD, CVA, DVT, HLD, CKD stage III arrives at the emergency department complaining of chest pain. The pain began early this morning PTA that woke him up from sleep and developed remained unchanged since. It is described as sharp when taking a deep breath. It is located in the substernal to left-chest region, radiates to the left back  and is associated.  No new shortness of breath.  Patient reports he is slightly short of breath at baseline.  The pain is constant, made worse by dep breathing and relieved with by Tylenol taken this morning. Pt denies  nausea, vomiting, diaphoresis, dyspnea, syncope, palpitations, fever, peripheral edema, abdominal pain, cough, or hemoptysis.  Patient reports he was slightly dizzy and lightheaded upon getting out of bed this morning. No preceding illness. No recent URI. Patient reports that he is never had an MI, and this does not feel like prior episodes of dysphasia with his esophageal webs.  Patient is not a smoker.  Patient does have a history of esophageal webs requiring dilatation. Patient did not take ASA or Xarelto this AM.  Patient followed by Dr. Adam Phenix cardiology for paroxysmal A. Fib.  Last echocardiogram in January 2018 demonstrates normal EF.  No catheterization reports today.   Past Medical History:  Diagnosis Date  . Anemia   . Arthritis   . BPH (benign prostatic hyperplasia)   . Carotid disease, bilateral ([place])    a. s/p L CEA in 2014. b. Duplex 05/2016: Right 40-59% ICA stenosis; Left 1-39% ICA stenosis  . CKD (chronic kidney disease), stage III ([place])   . COPD (chronic obstructive pulmonary disease) ([place])   . Diverticular disease 02/14/2011   problems  swallowing  . DVT (deep venous thrombosis) ([place])   . Esophageal dysmotility   . Esophageal web   . GERD (gastroesophageal reflux disease)   . Headache(784.0)   . History of blood transfusion   . Hyperlipidemia   . Hypertension   . Hypothyroidism   . Kidney stone   . Nephrolithiasis    lithotripsy 2006  . Non-ischemic cardiomyopathy ([place])    a. Prior EF 45%, normal nuc 2012. b. EF 50-55% in 2018.  [name] PAF (paroxysmal atrial fibrillation) ([place])    a. identified on monitor 06/2016.  [name] Pneumonia    hx of - hospitalized  . Stroke ([place]) 05/2016  . TIA (transient ischemic attack)     Patient Active Problem List   Diagnosis Date Noted  . Carotid stenosis R 06/14/2016  . History of CEA (carotid endarterectomy)   . History of stroke   . Hyperlipidemia   . CVA (cerebral vascular accident) ([place]) - sm R cortical cryptogenic infarct 06/11/2016  . Aftercare following surgery of the circulatory system, [place] 10/11/2013  . CVA (cerebral infarction) 02/23/2013  . Occlusion and stenosis of carotid artery with cerebral infarction 02/23/2013  . TIA (transient ischemic attack) 02/22/2013  . Right arm weakness 02/22/2013  . Hypertension 02/22/2013  . Esophageal dysmotility 02/24/2011  . Esophageal web 02/24/2011  . Mouth pain 02/24/2011  . Acute renal failure ([place]) 02/16/2011  . Hypokalemia 02/13/2011  . Protein-calorie malnutrition, severe ([place]) 02/13/2011  .  PNA (pneumonia) 02/10/2011  . Dysphagia 02/10/2011  . Anemia 02/10/2011  . Weakness generalized 02/10/2011  . S/P hip replacement right 02/10/2011    Past Surgical History:  Procedure Laterality Date  . CAROTID ENDARTERECTOMY Left 03-11-13   cea  . carpel tunnel Right   . ENDARTERECTOMY Left 03/11/2013   Procedure: ENDARTERECTOMY CAROTID with Vascu-Guard Bovine Patch.;  Surgeon: Conrad Morningside, MD;  Location: [facility];  Service: Vascular;  Laterality: Left;  . EP IMPLANTABLE DEVICE N/A 06/14/2016   Procedure: Loop Recorder Insertion;   Surgeon: Will Meredith Leeds, MD;  Location: [facility];  Service: Cardiovascular;  Laterality: N/A;  . JOINT REPLACEMENT Right    hip  . JOINT REPLACEMENT Left    knee  . KNEE ARTHROSCOPY Right 2010  . LOOP RECORDER REMOVAL N/A 08/23/2016   Procedure: Loop Recorder Removal;  Surgeon: Will Meredith Leeds, MD;  Location: [facility];  Service: Cardiovascular;  Laterality: N/A;  . REPLACEMENT TOTAL KNEE Left 2006   left knee  . TOTAL HIP ARTHROPLASTY Right august 6th 2012   right side   . TRANSURETHRAL RESECTION OF PROSTATE  1996       Home Medications    Prior to Admission medications   Medication Sig Start Date End Date Taking? Authorizing Provider  acetaminophen (TYLENOL) 500 MG tablet Take 500 mg by mouth daily as needed for headache.    [provider]  atorvastatin (LIPITOR) 20 MG tablet Take 1 tablet (20 mg total) by mouth daily at 6 PM. 09/26/16   Lendon Colonel, NP  levothyroxine (SYNTHROID, LEVOTHROID) 88 MCG tablet Take 88 mcg by mouth daily.  05/29/16   [provider]  losartan-hydrochlorothiazide (HYZAAR) 50-12.5 MG tablet Take 0.5 tablets by mouth daily. 09/15/16   Lendon Colonel, NP  Nutritional Supplements (ENSURE COMPLETE PO) Take 237 mLs by mouth See admin instructions. Drink one can (237 mls) by mouth every other night    [provider]  Psyllium (METAMUCIL PO) Take 2 scoop by mouth at bedtime. Mix in liquid and drink    [provider]  Rivaroxaban (XARELTO) 15 MG TABS tablet Take 1 tablet (15 mg total) by mouth daily with supper. 09/21/16   Lendon Colonel, NP  senna (SENOKOT) 8.6 MG TABS tablet Take 1 tablet by mouth at bedtime.    [provider]    Family History Family History  Problem Relation Age of Onset  . Stroke Father   . Heart attack Father   . Hypertension Mother   . Hypertension Unknown   . Diabetes Unknown     Social History Social History   Tobacco Use  . Smoking  status: Never Smoker  . Smokeless tobacco: Never Used  Substance Use Topics  . Alcohol use: No    Alcohol/week: 0.0 oz  . Drug use: No     Allergies   Sulfa antibiotics and Milk-related compounds   Review of Systems Review of Systems  Constitutional: Negative for chills and fever.  HENT: Positive for sore throat. Negative for congestion, rhinorrhea and sinus pain.   Respiratory: Positive for shortness of breath. Negative for cough and chest tightness.   Cardiovascular: Positive for chest pain. Negative for palpitations and leg swelling.  Gastrointestinal: Negative for abdominal pain, nausea and vomiting.  Musculoskeletal: Positive for back pain. Negative for myalgias.  Skin: Negative for rash.  Neurological: Positive for dizziness and light-headedness. Negative for syncope.  All other systems reviewed and are negative.  Physical Exam Updated Vital Signs BP 123/77 (BP Location: Left Arm)   Pulse 78   Resp 18   Ht 5\' 10"  (1.778 m)   Wt 61.2 kg (135 lb)   SpO2 100%   BMI 19.37 kg/m   Physical Exam  Constitutional: He appears well-developed and well-nourished. No distress.  HENT:  Head: Normocephalic and atraumatic.  Mouth/Throat: Oropharynx is clear and moist.  Eyes: Conjunctivae and EOM are normal. Pupils are equal, round, and reactive to light.  Neck: Normal range of motion. Neck supple.  Cardiovascular: Normal rate, S1 normal and S2 normal. An irregularly irregular rhythm present.  Murmur heard.  Systolic murmur is present. No lower extremity edema.  DP pulses are 2+ and equal.  1+ left radial pulse.  2+ right radial pulse.  Pulmonary/Chest: Effort normal. He has no wheezes. He has no rales.  Diminished lung sounds bilateral bases.  Abdominal: Soft. He exhibits no distension. There is no tenderness. There is no guarding.  Musculoskeletal: Normal range of motion. He exhibits no edema or deformity.  Lymphadenopathy:    He has no cervical adenopathy.    Neurological: He is alert.  Cranial nerves grossly intact. Patient was extremities symmetrically and with good coordination.  Skin: Skin is warm and dry. No rash noted. No erythema.  Psychiatric: He has a normal mood and affect. His behavior is normal. Judgment and thought content normal.  Nursing note and vitals reviewed.    ED Treatments / Results  Labs (all labs ordered are listed, but only abnormal results are displayed) Labs Reviewed  BASIC METABOLIC PANEL  TROPONIN I  TROPONIN I  CBC WITH DIFFERENTIAL/PLATELET  PROTIME-INR  APTT  CBG MONITORING, ED    EKG  EKG Interpretation None       Radiology No results found.  Procedures Procedures (including critical care time)  Medications Ordered in ED Medications  0.9 %  sodium chloride infusion (not administered)     Initial Impression / Assessment and Plan / ED Course  I have reviewed the triage vital signs and the nursing notes.  Pertinent labs & imaging results that were available during my care of the patient were reviewed by me and considered in my medical decision making (see chart for details).     Final Clinical Impressions(s) / ED Diagnoses   Final diagnoses:  Left sided chest pain   Patient is nontoxic-appearing and in no acute distress at this time.  Given concerning historical points of sharp pain, pleuritic pain, radiation to the left back, differential diagnosis includes ACS, thoracic aortic dissection, pulmonary embolism, pericarditis, pneumonia.  Will initiate troponin, CBC, BMP, chest x-ray, CT angio aortic dissection study. Case discussed with Dr. Francine Graven.  Patient offered analgesia for his chest pain.  Patient reports that at present, he is not having pain while resting and declines analgesia at this time.  10:59 AM Initial troponin is negative.  EKG was likely old inferior infarct, but no evidence of acute ischemia or infarction.  Will repeat with delta troponin.  CT angiogram  dissection study demonstrates a presumed new 4.2 cm ascending aortic aneurysm as well as focal dilatation and aneurysmal component in the descending thoracic aorta measuring 4.1 cm.  No evidence of dissection.  This is unlikely to be the cause of patient's acute pain.  Patient to be admitted for chest pain rule out.  Initiated consult with Dr. Domenic Polite to discuss appropriate location for patient's admission.  Patient has wife are updated at the plan of care.  11:12 AM I spoke with Dr. Domenic Polite cardiology on the phone.  Recommendation to admit to medicine here [facility] with cardiology follow-up in the morning.  Dr. Carles Collet of hospital medicine consulted and will admit the patient for chest pain rule out.   ED Discharge Orders    None       Tamala Julian 05/18/17 Logan, Merna, DO 05/20/17 1531

## 2017-05-18 NOTE — ED Notes (Signed)
Repeat EKG given to Dr. Thurnell Garbe

## 2017-05-18 NOTE — Progress Notes (Signed)
*  PRELIMINARY RESULTS* Echocardiogram 2D Echocardiogram has been performed.  Leavy Cella 05/18/2017, 4:31 PM

## 2017-05-18 NOTE — H&P (Signed)
History and Physical  Jorge Nixon ZOX:[id number] DOB: 11/05/1924 DOA: 05/18/2017   PCP: Celene Squibb, MD   Patient coming from: Home  Chief Complaint: chest pain  HPI:  Jorge Nixon is a 83 y.o. male with medical history of embolic stroke, artery disease, COPD, CKD stage III, hyperlipidemia, and left carotid endarterectomy presenting with chest pain when he woke up on the morning of May 18, 2017.  He describes the chest pain is sharp in nature that is substernal and radiates to the left chest and to his left lower back.  The patient had some associated shortness of breath and dizziness with standing.  He stated that the chest pain was worse with inspiration, but he denies any fevers, chills, coughing, hemoptysis, nausea, vomiting, diarrhea, abdominal pain.  He has not had any recent long travels.  He denies any recent strenuous activity or trauma.  The patient took some Tylenol at home which he states helped his pain.  Upon arrival to emergency department, the patient stated that he was chest pain-free at rest, but still had some pain with deep inspiration.  The patient endorses 100% compliance with his rivaroxaban.  In the emergency department, the patient was afebrile hemodynamically stable saturating 100% on room air.  EKG showed atrial flutter with nonspecific T wave changes.  BMP and CBC were unremarkable.  Initial troponin was negative.  Chest x-ray showed hyperinflation with chronic right lung scarring.  CT angiogram of the chest, abdomen, and pelvis showed 4.2 cm descending thoracic aortic aneurysm, 4.1 cm descending aortic aneurysm, 3.4 cm abdominal aortic aneurysm with moderate to severe bilateral renal artery stenosis.   Assessment/Plan: Atypical chest pain -Initial troponin negative -CT angiogram chest, abdomen, pelvis negative for dissection -With the patient's compliance to rivaroxaban, very low clinical suspicion for PE -Finish cycling  troponins -Echocardiogram -Cardiology consulted -Check lipid panel  Paroxysmal atrial fibrillation  -continue rivaroxaban -Rate controlled  CKD stage III -Baseline creatinine 1.0-1.3  COPD -Presently stable on room air -No evidence of exacerbation  History of stroke -Patient received TPA June 14, 2016 -Subsequently had loop recorder placed which found atrial fibrillation -Continue rivaroxaban  Thoracic aortic aneurysm/abdominal aortic aneurysm -Follow-up with vascular surgery outpatient for outpatient surveillance -Patient already follows with Dr. Adele Barthel -the patient and family were made aware of the findings   Renal artery stenosis--bilateral -Blood pressure is controlled -Outpatient follow-up with vascular surgery -The patient and family were made aware of the findings  Thrombocytopenia -This appears to be chronic -Follow CBC       Past Medical History:  Diagnosis Date  . Anemia   . Arthritis   . BPH (benign prostatic hyperplasia)   . Carotid disease, bilateral ([place])    a. s/p L CEA in 2014. b. Duplex 05/2016: Right 40-59% ICA stenosis; Left 1-39% ICA stenosis  . CKD (chronic kidney disease), stage III ([place])   . COPD (chronic obstructive pulmonary disease) ([place])   . Diverticular disease 02/14/2011   problems swallowing  . DVT (deep venous thrombosis) ([place])   . Esophageal dysmotility   . Esophageal web   . GERD (gastroesophageal reflux disease)   . Headache(784.0)   . History of blood transfusion   . Hyperlipidemia   . Hypertension   . Hypothyroidism   . Kidney stone   . Nephrolithiasis    lithotripsy 2006  . Non-ischemic cardiomyopathy ([place])    a. Prior EF 45%, normal nuc 2012. b. EF 50-55% in 2018.  [name]  PAF (paroxysmal atrial fibrillation) ([place])    a. identified on monitor 06/2016.  [name] Pneumonia    hx of - hospitalized  . Stroke ([place]) 05/2016  . TIA (transient ischemic attack)    Past Surgical History:  Procedure Laterality Date  .  CAROTID ENDARTERECTOMY Left 03-11-13   cea  . carpel tunnel Right   . ENDARTERECTOMY Left 03/11/2013   Procedure: ENDARTERECTOMY CAROTID with Vascu-Guard Bovine Patch.;  Surgeon: Conrad Sharon, MD;  Location: [facility];  Service: Vascular;  Laterality: Left;  . EP IMPLANTABLE DEVICE N/A 06/14/2016   Procedure: Loop Recorder Insertion;  Surgeon: Will Meredith Leeds, MD;  Location: [facility];  Service: Cardiovascular;  Laterality: N/A;  . JOINT REPLACEMENT Right    hip  . JOINT REPLACEMENT Left    knee  . KNEE ARTHROSCOPY Right 2010  . LOOP RECORDER REMOVAL N/A 08/23/2016   Procedure: Loop Recorder Removal;  Surgeon: Will Meredith Leeds, MD;  Location: [facility];  Service: Cardiovascular;  Laterality: N/A;  . REPLACEMENT TOTAL KNEE Left 2006   left knee  . TOTAL HIP ARTHROPLASTY Right august 6th 2012   right side   . TRANSURETHRAL RESECTION OF PROSTATE  1996   Social History:  reports that  has never smoked. he has never used smokeless tobacco. He reports that he does not drink alcohol or use drugs.   Family History  Problem Relation Age of Onset  . Stroke Father   . Heart attack Father   . Hypertension Mother   . Hypertension Unknown   . Diabetes Unknown      Allergies  Allergen Reactions  . Sulfa Antibiotics Rash  . Milk-Related Compounds Other (See Comments)    Upset stomach     Prior to Admission medications   Medication Sig Start Date End Date Taking? Authorizing Provider  acetaminophen (TYLENOL) 500 MG tablet Take 500 mg by mouth daily as needed for headache.   Yes [provider]  docusate sodium (COLACE) 100 MG capsule Take 100 mg by mouth at bedtime.   Yes [provider]  levothyroxine (SYNTHROID, LEVOTHROID) 88 MCG tablet Take 88 mcg by mouth daily.  05/29/16  Yes [provider]  Multiple Vitamins-Minerals (CENTRUM ADULTS PO) Take 1 tablet by mouth daily.   Yes [provider]  Nutritional Supplements (ENSURE  COMPLETE PO) Take 237 mLs by mouth See admin instructions. Drink one can (237 mls) by mouth every other night   Yes [provider]  Psyllium (METAMUCIL PO) Take 2 scoop by mouth at bedtime. Mix in liquid and drink   Yes [provider]  Rivaroxaban (XARELTO) 15 MG TABS tablet Take 1 tablet (15 mg total) by mouth daily with supper. 09/21/16  Yes Lendon Colonel, NP  senna (SENOKOT) 8.6 MG TABS tablet Take 1 tablet by mouth at bedtime.   Yes [provider]    Review of Systems:  Constitutional:  No weight loss, night sweats, Fevers, chills, fatigue.  Head&Eyes: No headache.  No vision loss.  No eye pain or scotoma ENT:  No Difficulty swallowing,Tooth/dental problems,Sore throat,  No ear ache, post nasal drip,  Cardio-vascular:  No Orthopnea, PND, swelling in lower extremities,  dizziness, palpitations  GI:  No  abdominal pain, nausea, vomiting, diarrhea, loss of appetite, hematochezia, melena, heartburn, indigestion, Resp:  No shortness of breath with exertion or at rest. No cough. No coughing up of blood .No wheezing.No chest wall deformity  Skin:  no rash or lesions.  GU:  no dysuria, change in color of urine, no urgency or frequency. No flank pain.  Musculoskeletal:  No joint pain or swelling. No decreased range of motion. No back pain.  Psych:  No change in mood or affect. No depression or anxiety. Neurologic: No headache, no dysesthesia, no focal weakness, no vision loss. No syncope  Physical Exam: Vitals:   05/18/17 0900 05/18/17 0930 05/18/17 1100 05/18/17 1130  BP: 127/70 (!) 146/77 134/73 136/66  Pulse: 79 65 (!) 59 73  Resp:  20 15 20   SpO2: 100% 100% 98% 98%  Weight:      Height:       General:  A&O x 3, NAD, nontoxic, pleasant/cooperative Head/Eye: No conjunctival hemorrhage, no icterus, [place]/AT, No nystagmus ENT:  No icterus,  No thrush, good dentition, no pharyngeal exudate Neck:  No masses, no lymphadenpathy, no bruits CV:  RRR,  no rub, no gallop, no S3 Lung:  CTAB, good air movement, no wheeze, no rhonchi Abdomen: soft/NT, +BS, nondistended, no peritoneal signs Ext: No cyanosis, No rashes, No petechiae, No lymphangitis, No edema Neuro: CNII-XII intact, strength 4/5 in bilateral upper and lower extremities, no dysmetria  Labs on Admission:  Basic Metabolic Panel: Recent Labs  Lab 05/18/17 0829  NA 138  K 3.8  CL 101  CO2 25  GLUCOSE 102*  BUN 19  CREATININE 1.09  CALCIUM 9.1   Liver Function Tests: No results for input(s): AST, ALT, ALKPHOS, BILITOT, PROT, ALBUMIN in the last 168 hours. No results for input(s): LIPASE, AMYLASE in the last 168 hours. No results for input(s): AMMONIA in the last 168 hours. CBC: Recent Labs  Lab 05/18/17 0829  WBC 8.8  NEUTROABS 6.7  HGB 12.1*  HCT 38.0*  MCV 92.0  PLT 143*   Coagulation Profile: Recent Labs  Lab 05/18/17 0829  INR 1.76   Cardiac Enzymes: Recent Labs  Lab 05/18/17 0829 05/18/17 1128  TROPONINI <0.03 <0.03   BNP: Invalid input(s): POCBNP CBG: Recent Labs  Lab 05/18/17 0848  GLUCAP 83   Urine analysis:    Component Value Date/Time   COLORURINE YELLOW 06/11/2016 1125   APPEARANCEUR CLEAR 06/11/2016 1125   LABSPEC 1.019 06/11/2016 1125   PHURINE 5.0 06/11/2016 1125   GLUCOSEU NEGATIVE 06/11/2016 1125   HGBUR NEGATIVE 06/11/2016 1125   [place] 06/11/2016 1125   KETONESUR NEGATIVE 06/11/2016 1125   PROTEINUR NEGATIVE 06/11/2016 1125   UROBILINOGEN 0.2 02/09/2015 1050   NITRITE NEGATIVE 06/11/2016 1125   LEUKOCYTESUR NEGATIVE 06/11/2016 1125   Sepsis Labs: @LABRCNTIP (procalcitonin:4,lacticidven:4) )No results found for this or any previous visit (from the past 240 hour(s)).   Radiological Exams on Admission: Dg Chest 2 View  Result Date: 05/18/2017 CLINICAL DATA:  Generalized chest pain since wakening. EXAM: CHEST  2 VIEW COMPARISON:  04/27/2015 FINDINGS: Heart size is normal. Chronic aortic atherosclerosis.  Chronic pulmonary hyperinflation. Chronic scarring in the right middle lobe. No sign of infiltrate, collapse or effusion. No pulmonary edema. IMPRESSION: No active disease. Aortic atherosclerosis. Chronic right middle lobe scarring. Pulmonary hyperinflation. Electronically Signed   By: Nelson Chimes M.D.   On: 05/18/2017 09:04   Ct Angio Chest/abd/pel For Dissection W And/or W/wo  Result Date: 05/18/2017 CLINICAL DATA:  Chest and back pain. EXAM: CT ANGIOGRAPHY CHEST, ABDOMEN AND PELVIS TECHNIQUE: Multidetector CT imaging through the chest, abdomen and pelvis was performed using the standard protocol during bolus administration of intravenous contrast. Multiplanar reconstructed images and MIPs were obtained and reviewed to evaluate the vascular anatomy.  CONTRAST:  35mL ISOVUE-370 IOPAMIDOL (ISOVUE-370) INJECTION 76% COMPARISON:  CT scan of February 09, 2015. FINDINGS: CTA CHEST FINDINGS Cardiovascular: Atherosclerosis of thoracic aorta is noted without dissection. 4.2 cm ascending thoracic aortic aneurysm is noted. Focal aneurysmal dilatation of distal descending thoracic aorta is noted measuring 4.1 cm. Great vessels are widely patent without significant stenosis. Coronary artery calcifications are noted. No pericardial effusion is noted. Normal cardiac size. Mediastinum/Nodes: No enlarged mediastinal, hilar, or axillary lymph nodes. Thyroid gland, trachea, and esophagus demonstrate no significant findings. Lungs/Pleura: No pneumothorax or pleural effusion is noted. Mild biapical scarring is noted. Mild scarring is noted in the right middle lobe as well as lingular segment of left upper lobe. Musculoskeletal: No chest wall abnormality. No acute or significant osseous findings. Review of the MIP images confirms the above findings. CTA ABDOMEN AND PELVIS FINDINGS VASCULAR Aorta: Atherosclerosis of abdominal aorta is noted without dissection. 3.4 cm infrarenal abdominal aortic aneurysm is noted. Celiac: Patent  without evidence of aneurysm, dissection, vasculitis or significant stenosis. SMA: Patent without evidence of aneurysm, dissection, vasculitis or significant stenosis. Renals: Severe stenosis is noted at origin of right renal artery secondary to calcified plaque. Moderate to severe stenosis is noted in proximal portion of left renal artery secondary to calcified plaque. IMA: Patent without evidence of aneurysm, dissection, vasculitis or significant stenosis. Inflow: Patent without evidence of aneurysm, dissection, vasculitis or significant stenosis. Veins: No obvious venous abnormality within the limitations of this arterial phase study. Review of the MIP images confirms the above findings. NON-VASCULAR Hepatobiliary: No focal liver abnormality is seen. No gallstones, gallbladder wall thickening, or biliary dilatation. Pancreas: Unremarkable. No pancreatic ductal dilatation or surrounding inflammatory changes. Spleen: Normal in size without focal abnormality. Adrenals/Urinary Tract: Adrenal glands appear normal. Stable bilateral renal cysts are noted. No hydronephrosis or renal obstruction is noted. Urinary bladder is unremarkable. No renal or ureteral calculi are noted. Stomach/Bowel: Stomach is within normal limits. Appendix appears normal. No evidence of bowel wall thickening, distention, or inflammatory changes. Sigmoid diverticulosis is noted without inflammation. Lymphatic: Aortic atherosclerosis. No enlarged abdominal or pelvic lymph nodes. Reproductive: Prostate is unremarkable. Other: No abdominal wall hernia or abnormality. No abdominopelvic ascites. Musculoskeletal: Status post right hip arthroplasty. No acute abnormality seen in the must skeletal system. Review of the MIP images confirms the above findings. IMPRESSION: Atherosclerosis of thoracic and abdominal aorta is noted without dissection. 4.2 cm ascending thoracic aortic aneurysm is noted. 4.1 cm descending thoracic aneurysm is noted. Recommend  annual imaging followup by CTA or MRA. This recommendation follows 2010 ACCF/AHA/AATS/ACR/ASA/SCA/SCAI/SIR/STS/SVM Guidelines for the Diagnosis and Management of Patients with Thoracic Aortic Disease. Circulation. 2010; 121: D638-V564. 3.4 cm infrarenal abdominal aortic aneurysm is noted. Recommend followup by ultrasound in 3 years. This recommendation follows ACR consensus guidelines: White Paper of the ACR Incidental Findings Committee II on Vascular Findings. J Am Coll Radiol 2013; 10:789-794. Coronary artery calcifications are noted consistent with coronary artery disease. Moderate to severe stenoses are noted involving the proximal portions of both renal artery secondary to calcified plaque. Sigmoid diverticulosis without inflammation. Electronically Signed   By: Marijo Conception, M.D.   On: 05/18/2017 10:20    EKG: Independently reviewed. Aflutter, nonspecific TWI    Time spent:60 minutes Code Status:   FULL Family Communication:  Spouse updated at bedside; Daughter updated on phone Disposition Plan: expect 1 day hospitalization Consults called: Cardiology DVT Prophylaxis: Xarelto  Orson Eva, DO  [facility] Pager [phone]  If 7PM-7AM, please contact night-coverage [email or web address] Password  [email or web address] 05/18/2017, 12:05 PM

## 2017-05-18 NOTE — ED Triage Notes (Signed)
Pt reports states he woke up with chest pain this morning.  Took Tylenol with relief from pain.  Hurts to take a deep breath.

## 2017-05-18 NOTE — ED Notes (Signed)
Lab in room drawing blood  

## 2017-05-19 DIAGNOSIS — I714 Abdominal aortic aneurysm, without rupture: Secondary | ICD-10-CM | POA: Diagnosis not present

## 2017-05-19 DIAGNOSIS — N183 Chronic kidney disease, stage 3 (moderate): Secondary | ICD-10-CM | POA: Diagnosis not present

## 2017-05-19 DIAGNOSIS — K921 Melena: Secondary | ICD-10-CM

## 2017-05-19 DIAGNOSIS — R072 Precordial pain: Secondary | ICD-10-CM

## 2017-05-19 DIAGNOSIS — R0789 Other chest pain: Secondary | ICD-10-CM

## 2017-05-19 DIAGNOSIS — R079 Chest pain, unspecified: Secondary | ICD-10-CM

## 2017-05-19 DIAGNOSIS — I701 Atherosclerosis of renal artery: Secondary | ICD-10-CM | POA: Diagnosis not present

## 2017-05-19 DIAGNOSIS — I480 Paroxysmal atrial fibrillation: Secondary | ICD-10-CM

## 2017-05-19 DIAGNOSIS — K5909 Other constipation: Secondary | ICD-10-CM

## 2017-05-19 DIAGNOSIS — K5900 Constipation, unspecified: Secondary | ICD-10-CM | POA: Diagnosis not present

## 2017-05-19 DIAGNOSIS — D696 Thrombocytopenia, unspecified: Secondary | ICD-10-CM

## 2017-05-19 LAB — LIPID PANEL
Cholesterol: 87 mg/dL (ref 0–200)
HDL: 34 mg/dL — ABNORMAL LOW
LDL Cholesterol: 47 mg/dL (ref 0–99)
Total CHOL/HDL Ratio: 2.6 ratio
Triglycerides: 32 mg/dL
VLDL: 6 mg/dL (ref 0–40)

## 2017-05-19 LAB — BASIC METABOLIC PANEL
ANION GAP: 11 (ref 5–15)
BUN: 14 mg/dL (ref 6–20)
CALCIUM: 8.6 mg/dL — AB (ref 8.9–10.3)
CO2: 23 mmol/L (ref 22–32)
Chloride: 102 mmol/L (ref 101–111)
Creatinine, Ser: 1.03 mg/dL (ref 0.61–1.24)
Glucose, Bld: 91 mg/dL (ref 65–99)
POTASSIUM: 3.8 mmol/L (ref 3.5–5.1)
Sodium: 136 mmol/L (ref 135–145)

## 2017-05-19 LAB — TROPONIN I: Troponin I: 0.03 ng/mL

## 2017-05-19 LAB — CBC
HEMATOCRIT: 34 % — AB (ref 39.0–52.0)
HEMOGLOBIN: 11 g/dL — AB (ref 13.0–17.0)
MCH: 29.6 pg (ref 26.0–34.0)
MCHC: 32.4 g/dL (ref 30.0–36.0)
MCV: 91.4 fL (ref 78.0–100.0)
Platelets: 128 10*3/uL — ABNORMAL LOW (ref 150–400)
RBC: 3.72 MIL/uL — AB (ref 4.22–5.81)
RDW: 15 % (ref 11.5–15.5)
WBC: 5.6 10*3/uL (ref 4.0–10.5)

## 2017-05-19 MED ORDER — DOCUSATE SODIUM 100 MG PO CAPS
100.0000 mg | ORAL_CAPSULE | Freq: Every day | ORAL | Status: DC
  Administered 2017-05-19 – 2017-05-20 (×2): 100 mg via ORAL
  Filled 2017-05-19 (×2): qty 1

## 2017-05-19 MED ORDER — RIVAROXABAN 15 MG PO TABS
15.0000 mg | ORAL_TABLET | Freq: Every day | ORAL | Status: DC
  Administered 2017-05-19: 15 mg via ORAL
  Filled 2017-05-19: qty 1

## 2017-05-19 MED ORDER — SENNA 8.6 MG PO TABS
2.0000 | ORAL_TABLET | Freq: Every day | ORAL | Status: DC
  Administered 2017-05-19: 17.2 mg via ORAL
  Filled 2017-05-19: qty 2

## 2017-05-19 MED ORDER — LACTULOSE 10 GM/15ML PO SOLN
10.0000 g | Freq: Once | ORAL | Status: AC
  Administered 2017-05-19: 10 g via ORAL
  Filled 2017-05-19: qty 30

## 2017-05-19 NOTE — Consult Note (Signed)
Referring Provider: No ref. provider found Primary Care Physician:  Celene Squibb, MD Primary Gastroenterologist:  Dr. Gala Romney  Date of Admission: 05/18/2017 Date of Consultation: 05/19/2017  Reason for Consultation:  Hematochezia  HPI:  Jorge Nixon is a 83 y.o. male with a past medical history of anemia, bilateral carotid disease, chronic kidney disease stage III, COPD, diverticular disease, DVT, esophageal dysmotility/web, GERD, hypertension, hyperlipidemia, nonischemic cardiomyopathy with EF 45%, proximal A. fib on anticoagulation, TIA, stroke.  He presented to the emergency room with left-sided chest pain as sharp, substernal, radiates to the lower back with some associated shortness of breath and dizziness when standing.  Pain was worse with inspiration.  Denies fever, chills, hemoptysis, nausea, vomiting, diarrhea, abdominal pain.  Tylenol helped his pain.  He was pain-free in the emergency department but still with some pain and deep inspiration.  He is on rivaroxaban for his A. fib.  Hemodynamically stable in the ER, EKG with atrial flutter which then resulted to normal sinus rhythm.  Labs unremarkable.  Troponins were negative.  Chest x-ray with hyperinflation and chronic lung scarring, CT showed descending thoracic aortic aneurysm and abdominal aortic aneurysm.  Cardiology was consulted.  They feel his pain is atypical for cardiac etiology.  Recommended follow-up for aneurysm in 3 years.  At some point we were consulted for "hematochezia."  However, there is no mention in the H&P, progress notes, consult notes, or any nurse's notes to indicate hematochezia.  The patient did have somewhat of a decline in hemoglobin from 12.1 on admission to 11.0 today.  However, he has been admitted and on hydration and there is likely some hydration effect associated with this.  Today he states chest pain is somewhat improved.  Still with some minor pain with deep breathing but this is getting better.   States he normally takes an over-the-counter vegetable laxative which keeps his stools soft.  He has been receiving this at the hospital.  He had a bowel movement this morning which is described as somewhat hard, requiring straining.  At this time he noted streaks of blood in his stool, as was noted by the nurse on her flow sheet.  No recurrent bleeding after his bowel movement.  Denies history of hemorrhoids.  Denies rectal itching or irritation or burning.  Colonoscopy in the remote past given his age.  Denies abdominal pain, nausea, vomiting, melena, unintentional weight loss, fevers.  Denies NSAID use.  No other GI complaints at this time.  Past Medical History:  Diagnosis Date  . Anemia   . Arthritis   . BPH (benign prostatic hyperplasia)   . Carotid disease, bilateral ([place])    a. s/p L CEA in 2014. b. Duplex 05/2016: Right 40-59% ICA stenosis; Left 1-39% ICA stenosis  . CKD (chronic kidney disease), stage III ([place])   . COPD (chronic obstructive pulmonary disease) ([place])   . Diverticular disease 02/14/2011   problems swallowing  . DVT (deep venous thrombosis) ([place])   . Esophageal dysmotility   . Esophageal web   . GERD (gastroesophageal reflux disease)   . Headache(784.0)   . History of blood transfusion   . Hyperlipidemia   . Hypertension   . Hypothyroidism   . Kidney stone   . Nephrolithiasis    lithotripsy 2006  . Non-ischemic cardiomyopathy ([place])    a. Prior EF 45%, normal nuc 2012. b. EF 50-55% in 2018.  [name] PAF (paroxysmal atrial fibrillation) ([place])    a. identified on monitor 06/2016.  [name] Pneumonia  hx of - hospitalized  . Stroke ([place]) 05/2016  . TIA (transient ischemic attack)     Past Surgical History:  Procedure Laterality Date  . CAROTID ENDARTERECTOMY Left 03-11-13   cea  . carpel tunnel Right   . ENDARTERECTOMY Left 03/11/2013   Procedure: ENDARTERECTOMY CAROTID with Vascu-Guard Bovine Patch.;  Surgeon: Conrad Mellette, MD;  Location: [facility];  Service: Vascular;   Laterality: Left;  . EP IMPLANTABLE DEVICE N/A 06/14/2016   Procedure: Loop Recorder Insertion;  Surgeon: Will Meredith Leeds, MD;  Location: [facility];  Service: Cardiovascular;  Laterality: N/A;  . JOINT REPLACEMENT Right    hip  . JOINT REPLACEMENT Left    knee  . KNEE ARTHROSCOPY Right 2010  . LOOP RECORDER REMOVAL N/A 08/23/2016   Procedure: Loop Recorder Removal;  Surgeon: Will Meredith Leeds, MD;  Location: [facility];  Service: Cardiovascular;  Laterality: N/A;  . REPLACEMENT TOTAL KNEE Left 2006   left knee  . TOTAL HIP ARTHROPLASTY Right august 6th 2012   right side   . TRANSURETHRAL RESECTION OF PROSTATE  1996    Prior to Admission medications   Medication Sig Start Date End Date Taking? Authorizing Provider  acetaminophen (TYLENOL) 500 MG tablet Take 500 mg by mouth daily as needed for headache.   Yes [provider]  docusate sodium (COLACE) 100 MG capsule Take 100 mg by mouth at bedtime.   Yes [provider]  levothyroxine (SYNTHROID, LEVOTHROID) 88 MCG tablet Take 88 mcg by mouth daily.  05/29/16  Yes [provider]  Multiple Vitamins-Minerals (CENTRUM ADULTS PO) Take 1 tablet by mouth daily.   Yes [provider]  Nutritional Supplements (ENSURE COMPLETE PO) Take 237 mLs by mouth See admin instructions. Drink one can (237 mls) by mouth every other night   Yes [provider]  Psyllium (METAMUCIL PO) Take 2 scoop by mouth at bedtime. Mix in liquid and drink   Yes [provider]  Rivaroxaban (XARELTO) 15 MG TABS tablet Take 1 tablet (15 mg total) by mouth daily with supper. 09/21/16  Yes Lendon Colonel, NP  senna (SENOKOT) 8.6 MG TABS tablet Take 1 tablet by mouth at bedtime.   Yes [provider]    Current Facility-Administered Medications  Medication Dose Route Frequency Provider Last Rate Last Dose  . 0.9 %  sodium chloride infusion   Intravenous Continuous Langston Masker B, PA-C 50  mL/hr at 05/19/17 0422    . acetaminophen (TYLENOL) tablet 650 mg  650 mg Oral Q6H PRN Tat, David, MD       Or  . acetaminophen (TYLENOL) suppository 650 mg  650 mg Rectal Q6H PRN Tat, Shanon Brow, MD      . aspirin EC tablet 81 mg  81 mg Oral Daily Tat, David, MD   81 mg at 05/19/17 0845  . docusate sodium (COLACE) capsule 100 mg  100 mg Oral Benay Pike, MD   100 mg at 05/18/17 2034  . feeding supplement (ENSURE ENLIVE) (ENSURE ENLIVE) liquid 237 mL  237 mL Oral BID BM Tat, David, MD      . levothyroxine (SYNTHROID, LEVOTHROID) tablet 88 mcg  88 mcg Oral QAC breakfast Tat, Shanon Brow, MD   88 mcg at 05/19/17 0845  . multivitamin with minerals tablet 1 tablet  1 tablet Oral Daily Tat, David, MD   1 tablet at 05/19/17 0845  . ondansetron (ZOFRAN) tablet 4 mg  4 mg Oral Q6H PRN Tat, Shanon Brow,  MD       Or  . ondansetron (ZOFRAN) injection 4 mg  4 mg Intravenous Q6H PRN Tat, Shanon Brow, MD      . psyllium (HYDROCIL/METAMUCIL) packet 1 packet  1 packet Oral Benay Pike, MD   1 packet at 05/18/17 2034  . senna (SENOKOT) tablet 8.6 mg  1 tablet Oral Benay Pike, MD   8.6 mg at 05/18/17 2034    Allergies as of 05/18/2017 - Review Complete 05/18/2017  Allergen Reaction Noted  . Sulfa antibiotics Rash 12/23/2010  . Milk-related compounds Other (See Comments) 03/07/2013    Family History  Problem Relation Age of Onset  . Stroke Father   . Heart attack Father   . Hypertension Mother   . Hypertension Unknown   . Diabetes Unknown     Social History   Socioeconomic History  . Marital status: Married    Spouse name: Not on file  . Number of children: 1  . Years of education: Not on file  . Highest education level: Not on file  Social Needs  . Financial resource strain: Not on file  . Food insecurity - worry: Not on file  . Food insecurity - inability: Not on file  . Transportation needs - medical: Not on file  . Transportation needs - non-medical: Not on file  Occupational History  . Occupation:  Retired Engineer, production  Tobacco Use  . Smoking status: Never Smoker  . Smokeless tobacco: Never Used  Substance and Sexual Activity  . Alcohol use: No    Alcohol/week: 0.0 oz  . Drug use: No  . Sexual activity: Not on file  Other Topics Concern  . Not on file  Social History Narrative  . Not on file    Review of Systems: General: Negative for anorexia, weight loss, fever, chills, fatigue, weakness. ENT: Negative for hoarseness, difficulty swallowing. CV: Negative for chest pain, angina, palpitations, peripheral edema.  Respiratory: Negative for dyspnea at rest, cough, sputum, wheezing.  GI: See history of present illness. MS: Negative for joint pain, low back pain.  Derm: Negative for rash or itching.  Endo: Negative for unusual weight change.  Heme: Negative for bruising or bleeding.  Physical Exam: Vital signs in last 24 hours: Temp:  [98.5 F (36.9 C)-98.6 F (37 C)] 98.6 F (37 C) (12/20 2158) Pulse Rate:  [58-81] 81 (12/20 2158) Resp:  [16-18] 16 (12/20 2158) BP: (122-140)/(68-88) 122/88 (12/20 2158) SpO2:  [95 %-100 %] 97 % (12/21 1154) Weight:  [130 lb 15.3 oz (59.4 kg)] 130 lb 15.3 oz (59.4 kg) (12/20 1309) Last BM Date: 05/16/17 General:   Alert,  Well-developed, well-nourished, pleasant and cooperative in NAD Head:  Normocephalic and atraumatic. Eyes:  Sclera clear, no icterus. Conjunctiva pink. Ears:  Normal auditory acuity. Neck:  Supple; no masses or thyromegaly. Lungs:  Clear throughout to auscultation. No wheezes, crackles, or rhonchi. No acute distress. Heart:  Regular rate and rhythm; no murmurs, clicks, rubs, or gallops. Abdomen:  Soft, nontender and nondistended. No masses, hepatosplenomegaly or hernias noted. Normal bowel sounds, without guarding, and without rebound.   Rectal:  Deferred.   Msk:  Symmetrical without gross deformities. Pulses:  Normal bilateral DP pulses noted. Extremities:  Without clubbing or edema. Neurologic:  Alert and   oriented x4;  grossly normal neurologically. Skin:  Intact without significant lesions or rashes. Psych:  Alert and cooperative. Normal mood and affect.  Intake/Output from previous day: 12/20 0701 - 12/21 0700 In: 1438.3 [I.V.:938.3; IV  KDTOIZTIW:580] Out: 425 [Urine:425] Intake/Output this shift: Total I/O In: 521.7 [P.O.:240; I.V.:281.7] Out: -   Lab Results: Recent Labs    05/18/17 0829 05/19/17 0745  WBC 8.8 5.6  HGB 12.1* 11.0*  HCT 38.0* 34.0*  PLT 143* 128*   BMET Recent Labs    05/18/17 0829 05/19/17 0745  NA 138 136  K 3.8 3.8  CL 101 102  CO2 25 23  GLUCOSE 102* 91  BUN 19 14  CREATININE 1.09 1.03  CALCIUM 9.1 8.6*   LFT No results for input(s): PROT, ALBUMIN, AST, ALT, ALKPHOS, BILITOT, BILIDIR, IBILI in the last 72 hours. PT/INR Recent Labs    05/18/17 0829  LABPROT 20.3*  INR 1.76   Hepatitis Panel No results for input(s): HEPBSAG, HCVAB, HEPAIGM, HEPBIGM in the last 72 hours. C-Diff No results for input(s): CDIFFTOX in the last 72 hours.  Studies/Results: Dg Chest 2 View  Result Date: 05/18/2017 CLINICAL DATA:  Generalized chest pain since wakening. EXAM: CHEST  2 VIEW COMPARISON:  04/27/2015 FINDINGS: Heart size is normal. Chronic aortic atherosclerosis. Chronic pulmonary hyperinflation. Chronic scarring in the right middle lobe. No sign of infiltrate, collapse or effusion. No pulmonary edema. IMPRESSION: No active disease. Aortic atherosclerosis. Chronic right middle lobe scarring. Pulmonary hyperinflation. Electronically Signed   By: Nelson Chimes M.D.   On: 05/18/2017 09:04   Ct Angio Chest/abd/pel For Dissection W And/or W/wo  Result Date: 05/18/2017 CLINICAL DATA:  Chest and back pain. EXAM: CT ANGIOGRAPHY CHEST, ABDOMEN AND PELVIS TECHNIQUE: Multidetector CT imaging through the chest, abdomen and pelvis was performed using the standard protocol during bolus administration of intravenous contrast. Multiplanar reconstructed images and  MIPs were obtained and reviewed to evaluate the vascular anatomy. CONTRAST:  63mL ISOVUE-370 IOPAMIDOL (ISOVUE-370) INJECTION 76% COMPARISON:  CT scan of February 09, 2015. FINDINGS: CTA CHEST FINDINGS Cardiovascular: Atherosclerosis of thoracic aorta is noted without dissection. 4.2 cm ascending thoracic aortic aneurysm is noted. Focal aneurysmal dilatation of distal descending thoracic aorta is noted measuring 4.1 cm. Great vessels are widely patent without significant stenosis. Coronary artery calcifications are noted. No pericardial effusion is noted. Normal cardiac size. Mediastinum/Nodes: No enlarged mediastinal, hilar, or axillary lymph nodes. Thyroid gland, trachea, and esophagus demonstrate no significant findings. Lungs/Pleura: No pneumothorax or pleural effusion is noted. Mild biapical scarring is noted. Mild scarring is noted in the right middle lobe as well as lingular segment of left upper lobe. Musculoskeletal: No chest wall abnormality. No acute or significant osseous findings. Review of the MIP images confirms the above findings. CTA ABDOMEN AND PELVIS FINDINGS VASCULAR Aorta: Atherosclerosis of abdominal aorta is noted without dissection. 3.4 cm infrarenal abdominal aortic aneurysm is noted. Celiac: Patent without evidence of aneurysm, dissection, vasculitis or significant stenosis. SMA: Patent without evidence of aneurysm, dissection, vasculitis or significant stenosis. Renals: Severe stenosis is noted at origin of right renal artery secondary to calcified plaque. Moderate to severe stenosis is noted in proximal portion of left renal artery secondary to calcified plaque. IMA: Patent without evidence of aneurysm, dissection, vasculitis or significant stenosis. Inflow: Patent without evidence of aneurysm, dissection, vasculitis or significant stenosis. Veins: No obvious venous abnormality within the limitations of this arterial phase study. Review of the MIP images confirms the above findings.  NON-VASCULAR Hepatobiliary: No focal liver abnormality is seen. No gallstones, gallbladder wall thickening, or biliary dilatation. Pancreas: Unremarkable. No pancreatic ductal dilatation or surrounding inflammatory changes. Spleen: Normal in size without focal abnormality. Adrenals/Urinary Tract: Adrenal glands appear normal. Stable bilateral renal  cysts are noted. No hydronephrosis or renal obstruction is noted. Urinary bladder is unremarkable. No renal or ureteral calculi are noted. Stomach/Bowel: Stomach is within normal limits. Appendix appears normal. No evidence of bowel wall thickening, distention, or inflammatory changes. Sigmoid diverticulosis is noted without inflammation. Lymphatic: Aortic atherosclerosis. No enlarged abdominal or pelvic lymph nodes. Reproductive: Prostate is unremarkable. Other: No abdominal wall hernia or abnormality. No abdominopelvic ascites. Musculoskeletal: Status post right hip arthroplasty. No acute abnormality seen in the must skeletal system. Review of the MIP images confirms the above findings. IMPRESSION: Atherosclerosis of thoracic and abdominal aorta is noted without dissection. 4.2 cm ascending thoracic aortic aneurysm is noted. 4.1 cm descending thoracic aneurysm is noted. Recommend annual imaging followup by CTA or MRA. This recommendation follows 2010 ACCF/AHA/AATS/ACR/ASA/SCA/SCAI/SIR/STS/SVM Guidelines for the Diagnosis and Management of Patients with Thoracic Aortic Disease. Circulation. 2010; 121: N361-W431. 3.4 cm infrarenal abdominal aortic aneurysm is noted. Recommend followup by ultrasound in 3 years. This recommendation follows ACR consensus guidelines: White Paper of the ACR Incidental Findings Committee II on Vascular Findings. J Am Coll Radiol 2013; 10:789-794. Coronary artery calcifications are noted consistent with coronary artery disease. Moderate to severe stenoses are noted involving the proximal portions of both renal artery secondary to calcified  plaque. Sigmoid diverticulosis without inflammation. Electronically Signed   By: Marijo Conception, M.D.   On: 05/18/2017 10:20    Impression: Pleasant 83 year old gentleman admitted for chest pain worse on deep inspiration.  We were consulted due to "moderate amount" of hematochezia with his bowel movement this morning.  After discussing with the patient he does not have any other GI symptoms.  He did have some streaking in his stools this morning, is on Xarelto.  Denies overt hemorrhoid symptoms, history of hemorrhoids.  He stated his bowel movement this morning was hard and required significant straining.  His hemoglobin has dropped from 12.0-11.1 although likely some contribution from chronic kidney disease stage III and hydration effect.  At this point it appears to be trivial, likely benign anorectal source of bleeding worsened by anticoagulation.  We will ensure that he is on adequate bowel regimen to prevent constipation.  He is adamant that he does not have constipation at home.  I discussed that when he does eventually get discharged from the hospital the goal would be for soft bowel movements without straining.  It appears his Xarelto has been discontinued, likely due to his GI bleed.  Given the likely to reveal source of the bleed and his past medical history including a CVA at the beginning of 2018 it is likely prudent to keep him on this medication and monitor for bleeding.  I will discuss further with Dr. Buford Dresser.  Plan: 1. Check CBC in the morning 2. Monitor for recurrent GI bleed 3. Transfuse as necessary 4. Continue Senokot 5. Add Colace 100 mg daily 6. Supportive measures 7. We will discuss with Dr. Gala Romney about the question of restarting Xarelto.   LOS: 0 days     05/19/2017, 12:01 PM    ADDENDUM: Discussed with Dr. Gala Romney. Ok to resume Xarelto.

## 2017-05-19 NOTE — Consult Note (Signed)
CARDIOLOGY CONSULT NOTE    Patient ID: Jorge Nixon; [id number]; [birth year]   Admit date: 05/18/2017 Date of Consult: 05/19/2017  Primary Care Provider: Celene Squibb, MD Primary Cardiologist: Allegra Lai, MD Primary Electrophysiologist:  Allegra Lai, MD   Patient Profile:   Jorge Nixon is a 83 y.o. male with a hx of PAF on Xarelto, HTN, HLD, RAS,  BPH s/p TURP, hypothyroidism, COPD, prior TIA 2014 associated with carotid disease s/p L CEA, cryptogenic stroke 05/2016, anemia, esophageal dysmotility, esophageal web, arthritis with prior joint replacements, diverticular disease, DVT, hypothyroidism, NICM (prior EF 45% with normal nuc in 11/2010), probable CKD stage III  who is being seen today for the evaluation of chest pain at the request of Dr. Carles Collet.   History of Present Illness:   Jorge Nixon presented to the emergency room with shortness of breath, coughing, some dizziness with standing, and complaints of sharp left-sided chest pain radiating to his left lower back.  This was relieved with Tylenol.  Arrival to the emergency room he was without complaint of chest pain.  He states the pain woke him up about 3 AM.  He states that it worsened with deep inspiration.   CT Chest revealed horacic and abdominal aortic aneurysm which did not show dissection, prone and was found to be A. fib x4.  EKG initially revealed atrial flutter, but follow-up EKG now reveals normal sinus rhythm with prolonged QT interval.  On arrival to the emergency room the patient's blood pressure was 123/77, heart rate 78, O2 sat 100%, no temperature was documented.  Pertinent labs revealed a glucose of 102, creatinine 1.09, potassium was 3.8.  Globin 12.1, hematocrit 38.0, he had some mild thrombocytopenia with platelets of 243, PT 20.3, INR 1.76.  Chest x-ray revealed no active disease, chronic right middle lobe scarring with pulmonary hyperinflation.  CT scan revealed coronary artery calcifications, 4.2 cm ascending  thoracic aortic aneurysm, and 3.4 cm infrarenal abdominal aortic aneurysm was noted.  Most recent CT scan abdomen in 2015 revealed infrarenal abdominal aortic ectasia up to 3 cm.  CT scan of chest in 2013 did not document thoracic aortic aneurysm.   He was treated with IV fluids, aspirin, and admitted to rule out ACS.  The patient continues to have discomfort with taking deep breaths, usually sharp pain on the left side of the chest.  He also complains of a sore throat.  He continues to have pain with inspiration, and now complains of a headache.  Past Medical History:  Diagnosis Date  . Anemia   . Arthritis   . BPH (benign prostatic hyperplasia)   . Carotid disease, bilateral (Jorge Nixon)    a. s/p L CEA in 2014. b. Duplex 05/2016: Right 40-59% ICA stenosis; Left 1-39% ICA stenosis  . CKD (chronic kidney disease), stage III ([place])   . COPD (chronic obstructive pulmonary disease) ([place])   . Diverticular disease 02/14/2011   problems swallowing  . DVT (deep venous thrombosis) ([place])   . Esophageal dysmotility   . Esophageal web   . GERD (gastroesophageal reflux disease)   . Headache(784.0)   . History of blood transfusion   . Hyperlipidemia   . Hypertension   . Hypothyroidism   . Kidney stone   . Nephrolithiasis    lithotripsy 2006  . Non-ischemic cardiomyopathy ([place])    a. Prior EF 45%, normal nuc 2012. b. EF 50-55% in 2018.  [name] PAF (paroxysmal atrial fibrillation) ([place])    a. identified on monitor  06/2016.  [name] Pneumonia    hx of - hospitalized  . Stroke ([place]) 05/2016  . TIA (transient ischemic attack)     Past Surgical History:  Procedure Laterality Date  . CAROTID ENDARTERECTOMY Left 03-11-13   cea  . carpel tunnel Right   . ENDARTERECTOMY Left 03/11/2013   Procedure: ENDARTERECTOMY CAROTID with Vascu-Guard Bovine Patch.;  Surgeon: Conrad Lipscomb, MD;  Location[place];  Service: Vascular;  Laterality: Left;  . EP IMPLANTABLE DEVICE N/A 06/14/2016   Procedure: Loop Recorder Insertion;   Surgeon: Will Meredith Leeds, MD;  Location: [facility];  Service: Cardiovascular;  Laterality: N/A;  . JOINT REPLACEMENT Right    hip  . JOINT REPLACEMENT Left    knee  . KNEE ARTHROSCOPY Right 2010  . LOOP RECORDER REMOVAL N/A 08/23/2016   Procedure: Loop Recorder Removal;  Surgeon: Will Meredith Leeds, MD;  Location: [facility];  Service: Cardiovascular;  Laterality: N/A;  . REPLACEMENT TOTAL KNEE Left 2006   left knee  . TOTAL HIP ARTHROPLASTY Right august 6th 2012   right side   . TRANSURETHRAL RESECTION OF PROSTATE  1996     Home Medications:  Prior to Admission medications   Medication Sig Start Date End Date Taking? Authorizing Provider  acetaminophen (TYLENOL) 500 MG tablet Take 500 mg by mouth daily as needed for headache.   Yes [provider]  docusate sodium (COLACE) 100 MG capsule Take 100 mg by mouth at bedtime.   Yes [provider]  levothyroxine (SYNTHROID, LEVOTHROID) 88 MCG tablet Take 88 mcg by mouth daily.  05/29/16  Yes [provider]  Multiple Vitamins-Minerals (CENTRUM ADULTS PO) Take 1 tablet by mouth daily.   Yes [provider]  Nutritional Supplements (ENSURE COMPLETE PO) Take 237 mLs by mouth See admin instructions. Drink one can (237 mls) by mouth every other night   Yes [provider]  Psyllium (METAMUCIL PO) Take 2 scoop by mouth at bedtime. Mix in liquid and drink   Yes [provider]  Rivaroxaban (XARELTO) 15 MG TABS tablet Take 1 tablet (15 mg total) by mouth daily with supper. 09/21/16  Yes Lendon Colonel, NP  senna (SENOKOT) 8.6 MG TABS tablet Take 1 tablet by mouth at bedtime.   Yes [provider]    Inpatient Medications: Scheduled Meds: . aspirin EC  81 mg Oral Daily  . docusate sodium  100 mg Oral QHS  . feeding supplement (ENSURE ENLIVE)  237 mL Oral BID BM  . levothyroxine  88 mcg Oral QAC breakfast  . multivitamin with minerals  1 tablet Oral Daily  .  psyllium  1 packet Oral QHS  . Rivaroxaban  15 mg Oral Q supper  . senna  1 tablet Oral QHS   Continuous Infusions: . sodium chloride 50 mL/hr at 05/19/17 0422   PRN Meds: acetaminophen **OR** acetaminophen, ondansetron **OR** ondansetron (ZOFRAN) IV  Allergies:    Allergies  Allergen Reactions  . Sulfa Antibiotics Rash  . Milk-Related Compounds Other (See Comments)    Upset stomach    Social History:   Social History   Socioeconomic History  . Marital status: Married    Spouse name: Not on file  . Number of children: 1  . Years of education: Not on file  . Highest education level: Not on file  Social Needs  . Financial resource strain: Not on file  . Food insecurity - worry: Not on file  . Food insecurity -  inability: Not on file  . Transportation needs - medical: Not on file  . Transportation needs - non-medical: Not on file  Occupational History  . Occupation: Retired Engineer, production  Tobacco Use  . Smoking status: Never Smoker  . Smokeless tobacco: Never Used  Substance and Sexual Activity  . Alcohol use: No    Alcohol/week: 0.0 oz  . Drug use: No  . Sexual activity: Not on file  Other Topics Concern  . Not on file  Social History Narrative  . Not on file    Family History:    Family History  Problem Relation Age of Onset  . Stroke Father   . Heart attack Father   . Hypertension Mother   . Hypertension Unknown   . Diabetes Unknown      ROS:  Please see the history of present illness.  ROS  All other ROS reviewed and negative.     Physical Exam/Data:   Vitals:   05/18/17 1200 05/18/17 1230 05/18/17 1309 05/18/17 2158  BP: (!) 159/78 140/68 132/75 122/88  Pulse:  (!) 58 65 81  Resp: (!) 26 18 18 16   Temp:   98.5 F (36.9 C) 98.6 F (37 C)  TempSrc:   Oral Oral  SpO2: 98% 99% 100% 95%  Weight:   130 lb 15.3 oz (59.4 kg)   Height:   5\' 10"  (1.778 m)     Intake/Output Summary (Last 24 hours) at 05/19/2017 0917 Last data filed at  05/19/2017 0631 Gross per 24 hour  Intake 1438.33 ml  Output 425 ml  Net 1013.33 ml   Filed Weights   05/18/17 0820 05/18/17 1309  Weight: 135 lb (61.2 kg) 130 lb 15.3 oz (59.4 kg)   Body mass index is 18.79 kg/m.  General:  Well nourished, well developed, in no acute distress, HEENT: normal Lymph: no adenopathy Neck: no JVD Endocrine:  No thryomegaly Vascular: Bilateral carotid bruits; FA pulses 2+ bilaterally without bruits  Cardiac:  normal S1, S2; RRR; no murmur 1/6 systolic murmur. Lungs:  clear to auscultation bilaterally, no wheezing, rhonchi or rales  Abd: soft, nontender, no hepatomegaly no abdominal bruits. Ext: no edema Musculoskeletal:  No deformities, BUE and BLE strength normal and equal Skin: warm and dry  Neuro:  CNs 2-12 intact, no focal abnormalities noted Psych:  Normal affect   EKG:  The EKG was personally reviewed and demonstrates:  Atrial flutter 4:1 AV Block, anterior, inferior abnormalities.  Telemetry:  Telemetry was personally reviewed and demonstrates:   Relevant CV Studies:  NM Stress Test 12/22/2010 IMPRESSION:  1.  No evidence of myocardial ischemia or infarction. 2.  Normal left ventricular wall motion. 3.  Estimated Q G S ejection fraction 62%.  Laboratory Data:  Chemistry Recent Labs  Lab 05/18/17 0829  NA 138  K 3.8  CL 101  CO2 25  GLUCOSE 102*  BUN 19  CREATININE 1.09  CALCIUM 9.1  GFRNONAA 58*  GFRAA >60  ANIONGAP 12    Hematology Recent Labs  Lab 05/18/17 0829  WBC 8.8  RBC 4.13*  HGB 12.1*  HCT 38.0*  MCV 92.0  MCH 29.3  MCHC 31.8  RDW 14.8  PLT 143*   Cardiac Enzymes Recent Labs  Lab 05/18/17 0829 05/18/17 1128 05/18/17 1725 05/18/17 2324  TROPONINI <0.03 <0.03 <0.03 0.03*    Radiology/Studies:  Dg Chest 2 View  Result Date: 05/18/2017 CLINICAL DATA:  Generalized chest pain since wakening. EXAM: CHEST  2 VIEW COMPARISON:  04/27/2015 FINDINGS:  Heart size is normal. Chronic aortic  atherosclerosis. Chronic pulmonary hyperinflation. Chronic scarring in the right middle lobe. No sign of infiltrate, collapse or effusion. No pulmonary edema. IMPRESSION: No active disease. Aortic atherosclerosis. Chronic right middle lobe scarring. Pulmonary hyperinflation. Electronically Signed   By: Nelson Chimes M.D.   On: 05/18/2017 09:04   Ct Angio Chest/abd/pel For Dissection W And/or W/wo  Result Date: 05/18/2017 CLINICAL DATA:  Chest and back pain. EXAM: CT ANGIOGRAPHY CHEST, ABDOMEN AND PELVIS TECHNIQUE: Multidetector CT imaging through the chest, abdomen and pelvis was performed using the standard protocol during bolus administration of intravenous contrast. Multiplanar reconstructed images and MIPs were obtained and reviewed to evaluate the vascular anatomy. CONTRAST:  65mL ISOVUE-370 IOPAMIDOL (ISOVUE-370) INJECTION 76% COMPARISON:  CT scan of February 09, 2015. FINDINGS: CTA CHEST FINDINGS Cardiovascular: Atherosclerosis of thoracic aorta is noted without dissection. 4.2 cm ascending thoracic aortic aneurysm is noted. Focal aneurysmal dilatation of distal descending thoracic aorta is noted measuring 4.1 cm. Great vessels are widely patent without significant stenosis. Coronary artery calcifications are noted. No pericardial effusion is noted. Normal cardiac size. Mediastinum/Nodes: No enlarged mediastinal, hilar, or axillary lymph nodes. Thyroid gland, trachea, and esophagus demonstrate no significant findings. Lungs/Pleura: No pneumothorax or pleural effusion is noted. Mild biapical scarring is noted. Mild scarring is noted in the right middle lobe as well as lingular segment of left upper lobe. Musculoskeletal: No chest wall abnormality. No acute or significant osseous findings. Review of the MIP images confirms the above findings. CTA ABDOMEN AND PELVIS FINDINGS VASCULAR Aorta: Atherosclerosis of abdominal aorta is noted without dissection. 3.4 cm infrarenal abdominal aortic aneurysm is noted.  Celiac: Patent without evidence of aneurysm, dissection, vasculitis or significant stenosis. SMA: Patent without evidence of aneurysm, dissection, vasculitis or significant stenosis. Renals: Severe stenosis is noted at origin of right renal artery secondary to calcified plaque. Moderate to severe stenosis is noted in proximal portion of left renal artery secondary to calcified plaque. IMA: Patent without evidence of aneurysm, dissection, vasculitis or significant stenosis. Inflow: Patent without evidence of aneurysm, dissection, vasculitis or significant stenosis. Veins: No obvious venous abnormality within the limitations of this arterial phase study. Review of the MIP images confirms the above findings. NON-VASCULAR Hepatobiliary: No focal liver abnormality is seen. No gallstones, gallbladder wall thickening, or biliary dilatation. Pancreas: Unremarkable. No pancreatic ductal dilatation or surrounding inflammatory changes. Spleen: Normal in size without focal abnormality. Adrenals/Urinary Tract: Adrenal glands appear normal. Stable bilateral renal cysts are noted. No hydronephrosis or renal obstruction is noted. Urinary bladder is unremarkable. No renal or ureteral calculi are noted. Stomach/Bowel: Stomach is within normal limits. Appendix appears normal. No evidence of bowel wall thickening, distention, or inflammatory changes. Sigmoid diverticulosis is noted without inflammation. Lymphatic: Aortic atherosclerosis. No enlarged abdominal or pelvic lymph nodes. Reproductive: Prostate is unremarkable. Other: No abdominal wall hernia or abnormality. No abdominopelvic ascites. Musculoskeletal: Status post right hip arthroplasty. No acute abnormality seen in the must skeletal system. Review of the MIP images confirms the above findings. IMPRESSION: Atherosclerosis of thoracic and abdominal aorta is noted without dissection. 4.2 cm ascending thoracic aortic aneurysm is noted. 4.1 cm descending thoracic aneurysm is  noted. Recommend annual imaging followup by CTA or MRA. This recommendation follows 2010 ACCF/AHA/AATS/ACR/ASA/SCA/SCAI/SIR/STS/SVM Guidelines for the Diagnosis and Management of Patients with Thoracic Aortic Disease. Circulation. 2010; 121: I458-K998. 3.4 cm infrarenal abdominal aortic aneurysm is noted. Recommend followup by ultrasound in 3 years. This recommendation follows ACR consensus guidelines: White Paper of the ACR Incidental  Findings Committee II on Vascular Findings. J Am Coll Radiol 2013; 10:789-794. Coronary artery calcifications are noted consistent with coronary artery disease. Moderate to severe stenoses are noted involving the proximal portions of both renal artery secondary to calcified plaque. Sigmoid diverticulosis without inflammation. Electronically Signed   By: Marijo Conception, M.D.   On: 05/18/2017 10:20    Assessment and Plan:   1.  Chest pain: Atypical for cardiac etiology.  Described as sharp, associated with taking deep breaths.  Located on the left, non-radiating.  EKG does not review acute ST-T wave abnormalities.  It does show paroxysmal atrial flutter, currently in normal sinus rhythm.  Troponins are negative.  CT scan did show coronary artery disease.  He is currently comfortable with exception of taking deep breaths.  2.  Thoracic aortic aneurysm: New finding not documented in the past with CT scan of chest most recent in 2013.  This is measured at 4.1-4.2.  Patient was unaware of this.    3. Infrarenal aortic aneurysm: Not a new finding.  This is measured at 3.4 cm, compared to CT scan in 2016 measuring at 3.3, and 3.1 previously.  Patient states that he has been unaware of this diagnosis.  4.  Paroxysmal atrial fib/flutter: Normal sinus rhythm rate.  He remains on Xarelto and has been medically compliant.  He is currently not on AV nodal blocking agents.  5.  Carotid artery disease: Lateral carotid bruits are auscultated.  He is followed by Dr. Bridgett Larsson.  Signs of  headache pain.  6.  Hypothyroidism: Remains on levothyroxine.     For questions or updates, please contact [facility] Please consult [email or web address] for contact info under Cardiology/STEMI.   Signed, Phill Myron. West Pugh, ANP, AACC  05/19/2017 9:17 AM   The patient was seen and examined, and I agree with the history, physical exam, assessment and plan as documented above, with modifications as noted below. I have also personally reviewed all relevant documentation, old records, labs, and both radiographic and cardiovascular studies. I have also independently interpreted old and new ECG's.  The patient is a 83 year old male with a history of cryptogenic stroke.  He had an implantable loop recorder which demonstrated atrial fibrillation and was initiated on anticoagulation with Xarelto. Additional medical history includes hypertension, hyperlipidemia, hypothyroidism, COPD, prior TIA in 2014 associated with carotid disease status post left carotid endarterectomy, stroke in January 2018, anemia, esophageal dysmotility with esophageal web, DVT, and nonischemic cardiomyopathy with EF of 45%.  He was admitted with chest pain and has subsequently ruled out for acute coronary syndrome.  ECG which I personally interpreted demonstrated sinus rhythm with possible old inferior infarct pattern.  Echocardiogram performed yesterday demonstrated normal left ventricular systolic function, LVEF 08-65%, moderate LVH, with mild mitral and tricuspid regurgitation.  CT angiogram of the chest abdomen and pelvis detailed above with a 4.2 cm ascending thoracic aortic aneurysm and a 4.1 cm descending thoracic aneurysm.  There is a 3.4 cm infrarenal abdominal aortic aneurysm noted as well.  Hemoglobin was 11.  LDL is 47.  He had some constipation this morning and his nurse tells me that there was blood in his stool.  This occurred on 2 occasions today.  He has also been evaluated by gastroenterology while  here.  He currently denies any chest pain, palpitations, and shortness of breath.  Recommendations: Chest pain is atypical for an ischemic cardiac etiology.  Given that left ventricular systolic function is normal as were troponins, I do not recommend  any additional cardiac testing. Imaging for ascending and descending thoracic aortic aneurysms can be repeated in 1 year.  Abdominal aortic aneurysm imaging with ultrasound can be performed in 3 years. Xarelto has been held today due to blood noted in stool as mentioned above, which I will reinitiate for thromboembolic risk reduction given paroxysmal atrial fibrillation, prior TIA and stroke.  I think he can simply be given medications to help prevent further episodes of constipation which he does not experience at home.  Hemoglobin has not significantly dropped.  No further recommendations at this time.  I will sign off.  Kate Sable, MD, [facility]  05/19/2017 2:33 PM

## 2017-05-19 NOTE — Progress Notes (Signed)
Pharmacy Note:  Xarelto Jorge Nixon was on xarelto pta 15 mg daily with supper.  Dose appropriate for patient. This medication will be resumed. Thank you, Excell Seltzer, PharmD

## 2017-05-19 NOTE — Progress Notes (Signed)
PROGRESS NOTE  Jorge Nixon MVE:[id number] DOB: [birth year] DOA: 05/18/2017 PCP: Celene Squibb, MD  Brief History:  83 y.o. male with medical history of embolic stroke, artery disease, COPD, CKD stage III, hyperlipidemia, and left carotid endarterectomy presenting with chest pain when he woke up on the morning of May 18, 2017.  He describes the chest pain is sharp in nature that is substernal and radiates to the left chest and to his left lower back.  The patient had some associated shortness of breath and dizziness with standing.  He stated that the chest pain was worse with inspiration, but he denies any fevers, chills, coughing, hemoptysis, nausea, vomiting, diarrhea, abdominal pain.  He has not had any recent long travels.  He denies any recent strenuous activity or trauma.  The patient took some Tylenol at home which he states helped his pain.  Upon arrival to emergency department, the patient stated that he was chest pain-free at rest, but still had some pain with deep inspiration.  The patient endorses 100% compliance with his rivaroxaban.  In the emergency department, the patient was afebrile hemodynamically stable saturating 100% on room air.  EKG showed atrial flutter with nonspecific T wave changes.  BMP and CBC were unremarkable.  Initial troponin was negative.  Chest x-ray showed hyperinflation with chronic right lung scarring.  CT angiogram of the chest, abdomen, and pelvis showed 4.2 cm descending thoracic aortic aneurysm, 4.1 cm descending aortic aneurysm, 3.4 cm abdominal aortic aneurysm with moderate to severe bilateral renal artery stenosis.    Assessment/Plan: Atypical chest pain -troponins unrevealing x 3 -CT angiogram chest, abdomen, pelvis negative for dissection -With the patient's compliance to rivaroxaban, very low clinical suspicion for PE -Finish cycling troponins--<0.03 x 2-->0.03 -Echocardiogram-EF 50-55%, no WMA, mild TR, trivial AI -Cardiology consult  appreciated-->no further work up -Check lipid panel--LDL 47  Hematochezia -pt had moderate amount blood with BM 12/21 am -GI consult appreciated--> likely resulting from constipation.  Not a good colonoscopy candidate.  Can restart rivaroxaban. -monitor CBC--drop in Hgb today in part due to dilution from IVF  Paroxysmal atrial fibrillation  -Hold rivaroxaban in setting of new onset hematochezia--> can restart per GI -Rate controlled  CKD stage III -Baseline creatinine 1.0-1.3  COPD -Presently stable on room air -No evidence of exacerbation  History of stroke -Patient received TPA June 14, 2016 -Subsequently had loop recorder placed which found atrial fibrillation -Holding rivaroxaban as above  Thoracic aortic aneurysm/abdominal aortic aneurysm -Follow-up with vascular surgery outpatient for outpatient surveillance -Patient already follows with Dr. Adele Barthel -the patient and family were made aware of the findings  Renal artery stenosis--bilateral -Blood pressure is controlled -Outpatient follow-up with vascular surgery -The patient and family were made aware of the findings  Thrombocytopenia -This appears to be chronic -Follow CBC  Constipation -increase colace to bid -increase senna to two tabs daily -lactulose x 1    Disposition Plan:   Home 12/22 if Hgb stable Family Communication:   Spouse updated at bedside12/21  Consultants:  Cardiology, GI  Code Status:  FULL   DVT Prophylaxis:  SCDs   Procedures: As Listed in Progress Note Above  Antibiotics: None    Subjective: Patient denies fevers, chills, headache, chest pain, dyspnea, nausea, vomiting, diarrhea, abdominal pain, dysuria, hematuria, hematochezia, and melena.   Objective: Vitals:   05/18/17 1230 05/18/17 1309 05/18/17 2158 05/19/17 1154  BP: 140/68 132/75 122/88   Pulse: (!) 58 65  81   Resp: 18 18 16    Temp:  98.5 F (36.9 C) 98.6 F (37 C)   TempSrc:  Oral Oral   SpO2:  99% 100% 95% 97%  Weight:  59.4 kg (130 lb 15.3 oz)    Height:  5\' 10"  (1.778 m)      Intake/Output Summary (Last 24 hours) at 05/19/2017 1233 Last data filed at 05/19/2017 1000 Gross per 24 hour  Intake 1460 ml  Output 425 ml  Net 1035 ml   Weight change:  Exam:   General:  Pt is alert, follows commands appropriately, not in acute distress  HEENT: No icterus, No thrush, No neck mass, [place]/AT  Cardiovascular: RRR, S1/S2, no rubs, no gallops  Respiratory: CTA bilaterally, no wheezing, no crackles, no rhonchi  Abdomen: Soft/+BS, non tender, non distended, no guarding  Extremities: No edema, No lymphangitis, No petechiae, No rashes, no synovitis   Data Reviewed: I have personally reviewed following labs and imaging studies Basic Metabolic Panel: Recent Labs  Lab 05/18/17 0829 05/19/17 0745  NA 138 136  K 3.8 3.8  CL 101 102  CO2 25 23  GLUCOSE 102* 91  BUN 19 14  CREATININE 1.09 1.03  CALCIUM 9.1 8.6*   Liver Function Tests: No results for input(s): AST, ALT, ALKPHOS, BILITOT, PROT, ALBUMIN in the last 168 hours. No results for input(s): LIPASE, AMYLASE in the last 168 hours. No results for input(s): AMMONIA in the last 168 hours. Coagulation Profile: Recent Labs  Lab 05/18/17 0829  INR 1.76   CBC: Recent Labs  Lab 05/18/17 0829 05/19/17 0745  WBC 8.8 5.6  NEUTROABS 6.7  --   HGB 12.1* 11.0*  HCT 38.0* 34.0*  MCV 92.0 91.4  PLT 143* 128*   Cardiac Enzymes: Recent Labs  Lab 05/18/17 0829 05/18/17 1128 05/18/17 1725 05/18/17 2324  TROPONINI <0.03 <0.03 <0.03 0.03*   BNP: Invalid input(s): POCBNP CBG: Recent Labs  Lab 05/18/17 0848  GLUCAP 83   HbA1C: No results for input(s): HGBA1C in the last 72 hours. Urine analysis:    Component Value Date/Time   COLORURINE YELLOW 06/11/2016 1125   APPEARANCEUR CLEAR 06/11/2016 1125   LABSPEC 1.019 06/11/2016 1125   PHURINE 5.0 06/11/2016 1125   GLUCOSEU NEGATIVE 06/11/2016 1125   HGBUR  NEGATIVE 06/11/2016 1125   BILIRUBINUR NEGATIVE 06/11/2016 1125   KETONESUR NEGATIVE 06/11/2016 1125   PROTEINUR NEGATIVE 06/11/2016 1125   UROBILINOGEN 0.2 02/09/2015 1050   NITRITE NEGATIVE 06/11/2016 1125   LEUKOCYTESUR NEGATIVE 06/11/2016 1125   Sepsis Labs: @LABRCNTIP (procalcitonin:4,lacticidven:4) )No results found for this or any previous visit (from the past 240 hour(s)).   Scheduled Meds: . aspirin EC  81 mg Oral Daily  . docusate sodium  100 mg Oral QHS  . feeding supplement (ENSURE ENLIVE)  237 mL Oral BID BM  . levothyroxine  88 mcg Oral QAC breakfast  . multivitamin with minerals  1 tablet Oral Daily  . psyllium  1 packet Oral QHS  . senna  1 tablet Oral QHS   Continuous Infusions: . sodium chloride 50 mL/hr at 05/19/17 0422    Procedures/Studies: Dg Chest 2 View  Result Date: 05/18/2017 CLINICAL DATA:  Generalized chest pain since wakening. EXAM: CHEST  2 VIEW COMPARISON:  04/27/2015 FINDINGS: Heart size is normal. Chronic aortic atherosclerosis. Chronic pulmonary hyperinflation. Chronic scarring in the right middle lobe. No sign of infiltrate, collapse or effusion. No pulmonary edema. IMPRESSION: No active disease. Aortic atherosclerosis. Chronic right middle lobe scarring. Pulmonary hyperinflation.  Electronically Signed   By: Nelson Chimes M.D.   On: 05/18/2017 09:04   Ct Angio Chest/abd/pel For Dissection W And/or W/wo  Result Date: 05/18/2017 CLINICAL DATA:  Chest and back pain. EXAM: CT ANGIOGRAPHY CHEST, ABDOMEN AND PELVIS TECHNIQUE: Multidetector CT imaging through the chest, abdomen and pelvis was performed using the standard protocol during bolus administration of intravenous contrast. Multiplanar reconstructed images and MIPs were obtained and reviewed to evaluate the vascular anatomy. CONTRAST:  85mL ISOVUE-370 IOPAMIDOL (ISOVUE-370) INJECTION 76% COMPARISON:  CT scan of February 09, 2015. FINDINGS: CTA CHEST FINDINGS Cardiovascular: Atherosclerosis of  thoracic aorta is noted without dissection. 4.2 cm ascending thoracic aortic aneurysm is noted. Focal aneurysmal dilatation of distal descending thoracic aorta is noted measuring 4.1 cm. Great vessels are widely patent without significant stenosis. Coronary artery calcifications are noted. No pericardial effusion is noted. Normal cardiac size. Mediastinum/Nodes: No enlarged mediastinal, hilar, or axillary lymph nodes. Thyroid gland, trachea, and esophagus demonstrate no significant findings. Lungs/Pleura: No pneumothorax or pleural effusion is noted. Mild biapical scarring is noted. Mild scarring is noted in the right middle lobe as well as lingular segment of left upper lobe. Musculoskeletal: No chest wall abnormality. No acute or significant osseous findings. Review of the MIP images confirms the above findings. CTA ABDOMEN AND PELVIS FINDINGS VASCULAR Aorta: Atherosclerosis of abdominal aorta is noted without dissection. 3.4 cm infrarenal abdominal aortic aneurysm is noted. Celiac: Patent without evidence of aneurysm, dissection, vasculitis or significant stenosis. SMA: Patent without evidence of aneurysm, dissection, vasculitis or significant stenosis. Renals: Severe stenosis is noted at origin of right renal artery secondary to calcified plaque. Moderate to severe stenosis is noted in proximal portion of left renal artery secondary to calcified plaque. IMA: Patent without evidence of aneurysm, dissection, vasculitis or significant stenosis. Inflow: Patent without evidence of aneurysm, dissection, vasculitis or significant stenosis. Veins: No obvious venous abnormality within the limitations of this arterial phase study. Review of the MIP images confirms the above findings. NON-VASCULAR Hepatobiliary: No focal liver abnormality is seen. No gallstones, gallbladder wall thickening, or biliary dilatation. Pancreas: Unremarkable. No pancreatic ductal dilatation or surrounding inflammatory changes. Spleen: Normal in  size without focal abnormality. Adrenals/Urinary Tract: Adrenal glands appear normal. Stable bilateral renal cysts are noted. No hydronephrosis or renal obstruction is noted. Urinary bladder is unremarkable. No renal or ureteral calculi are noted. Stomach/Bowel: Stomach is within normal limits. Appendix appears normal. No evidence of bowel wall thickening, distention, or inflammatory changes. Sigmoid diverticulosis is noted without inflammation. Lymphatic: Aortic atherosclerosis. No enlarged abdominal or pelvic lymph nodes. Reproductive: Prostate is unremarkable. Other: No abdominal wall hernia or abnormality. No abdominopelvic ascites. Musculoskeletal: Status post right hip arthroplasty. No acute abnormality seen in the must skeletal system. Review of the MIP images confirms the above findings. IMPRESSION: Atherosclerosis of thoracic and abdominal aorta is noted without dissection. 4.2 cm ascending thoracic aortic aneurysm is noted. 4.1 cm descending thoracic aneurysm is noted. Recommend annual imaging followup by CTA or MRA. This recommendation follows 2010 ACCF/AHA/AATS/ACR/ASA/SCA/SCAI/SIR/STS/SVM Guidelines for the Diagnosis and Management of Patients with Thoracic Aortic Disease. Circulation. 2010; 121: G867-Y195. 3.4 cm infrarenal abdominal aortic aneurysm is noted. Recommend followup by ultrasound in 3 years. This recommendation follows ACR consensus guidelines: White Paper of the ACR Incidental Findings Committee II on Vascular Findings. J Am Coll Radiol 2013; 10:789-794. Coronary artery calcifications are noted consistent with coronary artery disease. Moderate to severe stenoses are noted involving the proximal portions of both renal artery secondary to calcified plaque. Sigmoid  diverticulosis without inflammation. Electronically Signed   By: Marijo Conception, M.D.   On: 05/18/2017 10:20    Orson Eva, DO  [facility] Pager [phone]  If 7PM-7AM, please contact  night-coverage [email or web address] Password [facility] 05/19/2017, 12:33 PM   LOS: 0 days

## 2017-05-20 DIAGNOSIS — K5909 Other constipation: Secondary | ICD-10-CM | POA: Diagnosis not present

## 2017-05-20 DIAGNOSIS — N183 Chronic kidney disease, stage 3 (moderate): Secondary | ICD-10-CM | POA: Diagnosis not present

## 2017-05-20 DIAGNOSIS — R079 Chest pain, unspecified: Secondary | ICD-10-CM | POA: Diagnosis not present

## 2017-05-20 DIAGNOSIS — I714 Abdominal aortic aneurysm, without rupture: Secondary | ICD-10-CM | POA: Diagnosis not present

## 2017-05-20 DIAGNOSIS — R0789 Other chest pain: Secondary | ICD-10-CM | POA: Diagnosis not present

## 2017-05-20 LAB — CBC
HEMATOCRIT: 35.7 % — AB (ref 39.0–52.0)
HEMOGLOBIN: 11.3 g/dL — AB (ref 13.0–17.0)
MCH: 28.9 pg (ref 26.0–34.0)
MCHC: 31.7 g/dL (ref 30.0–36.0)
MCV: 91.3 fL (ref 78.0–100.0)
Platelets: 129 10*3/uL — ABNORMAL LOW (ref 150–400)
RBC: 3.91 MIL/uL — AB (ref 4.22–5.81)
RDW: 14.8 % (ref 11.5–15.5)
WBC: 7.3 10*3/uL (ref 4.0–10.5)

## 2017-05-20 NOTE — Discharge Summary (Signed)
Physician Discharge Summary  Jorge Nixon RCV:[id number] DOB: 1925/02/03 DOA: 05/18/2017  PCP: Celene Squibb, MD  Admit date: 05/18/2017 Discharge date: 05/20/2017  Admitted From: Home Disposition:  Home  Recommendations for Outpatient Follow-up:  1. Follow up with PCP in 1-2 weeks 2. Please obtain BMP/CBC in one week    Discharge Condition: Stable CODE STATUS: FULL Diet recommendation: Heart Healthy    Brief/Interim Summary: 83 y.o.malewith medical history ofembolic stroke, artery disease, COPD, CKD stage III, hyperlipidemia, and left carotid endarterectomy presenting with chest pain when he woke up on the morning of May 18, 2017. He describes the chest pain is sharp in nature that is substernal and radiates to the left chest and to his left lower back. The patient had some associated shortness of breath and dizziness with standing. He stated that the chest pain was worse with inspiration, but he denies any fevers, chills, coughing, hemoptysis, nausea, vomiting, diarrhea, abdominal pain. He has not had any recent long travels. He denies any recent strenuous activity or trauma. The patient took some Tylenol at home which he states helped his pain. Upon arrival to emergency department, the patient stated that he was chest pain-free at rest, but still had some pain with deep inspiration.The patient endorses 100% compliance with his rivaroxaban. In the emergency department, the patient was afebrile hemodynamically stable saturating 100% on room air. EKG showed atrial flutter with nonspecific T wave changes. BMP and CBC were unremarkable. Initial troponin was negative. Chest x-ray showed hyperinflation with chronic right lung scarring. CT angiogram of the chest, abdomen, and pelvis showed 4.2 cm descending thoracic aortic aneurysm, 4.1 cm descending aortic aneurysm, 3.4 cm abdominal aortic aneurysm with moderate to severe bilateral renal artery stenosis.  The patient was seen  by cardiology who did not feel the patient needed any further testing.  He had episodes of hematochezia.  He was seen by GI.  They did not feel the patient needed any further testing and felt this may have been due to his constipation.  They were okay with the patient continued on rivaroxaban.  His hemoglobin remained stable and he remained hemodynamically stable.    Discharge Diagnoses:  Atypical chest pain -troponins unrevealing x 3 -CT angiogram chest, abdomen, pelvis negative for dissection -With the patient's compliance to rivaroxaban, very low clinical suspicion for PE -Finish cycling troponins--<0.03 x 2-->0.03 -Echocardiogram-EF 50-55%, no WMA, mild TR, trivial AI -Cardiology consult appreciated-->no further work up -Check lipid panel--LDL 47  Hematochezia -pt had moderate amount blood with BM 12/21 am -GI consult appreciated--> likely resulting from constipation.  Not a good colonoscopy candidate.  Can restart rivaroxaban. -monitor CBC--drop in Hgb today in part due to dilution from IVF -Hgb remained stable and hematochezia decreasing in volume and frequency -continue cathartics -Hgb 11.3 on day of d/c  Paroxysmal atrial fibrillation -Hold rivaroxaban in setting of new onset hematochezia--> can restart per GI -Rate controlled  CKD stage III -Baseline creatinine 1.0-1.3 -serum creatinine 1.03 at time of d/c  COPD -Presently stable on room air -No evidence of exacerbation  History of stroke -Patient received TPA June 14, 2016 -Subsequently had loop recorder placed which found atrial fibrillation -Holding rivaroxaban as above  Thoracic aortic aneurysm/abdominal aortic aneurysm -Follow-up with vascular surgery outpatientforoutpatient surveillance -Patient already follows with Dr. Adele Barthel -the patient and family were made aware of the findings  Renal artery stenosis--bilateral -Blood pressure is controlled -Outpatient follow-up with vascular  surgery -The patient and family were made aware of the findings  Thrombocytopenia -This appears to be chronic -Follow CBC  Constipation -increase colace to bid -increase senna to two tabs daily -lactulose x 1  HTN -pt states he takes Hyzaar at home--this can be restarted after d/c     Discharge Instructions  Discharge Instructions    Diet - low sodium heart healthy   Complete by:  As directed    Increase activity slowly   Complete by:  As directed      Allergies as of 05/20/2017      Reactions   Sulfa Antibiotics Rash   Milk-related Compounds Other (See Comments)   Upset stomach      Medication List    TAKE these medications   acetaminophen 500 MG tablet Commonly known as:  TYLENOL Take 500 mg by mouth daily as needed for headache.   CENTRUM ADULTS PO Take 1 tablet by mouth daily.   docusate sodium 100 MG capsule Commonly known as:  COLACE Take 100 mg by mouth at bedtime.   ENSURE COMPLETE PO Take 237 mLs by mouth See admin instructions. Drink one can (237 mls) by mouth every other night   levothyroxine 88 MCG tablet Commonly known as:  SYNTHROID, LEVOTHROID Take 88 mcg by mouth daily.   METAMUCIL PO Take 2 scoop by mouth at bedtime. Mix in liquid and drink   Rivaroxaban 15 MG Tabs tablet Commonly known as:  XARELTO Take 1 tablet (15 mg total) by mouth daily with supper.   senna 8.6 MG Tabs tablet Commonly known as:  SENOKOT Take 1 tablet by mouth at bedtime.       Allergies  Allergen Reactions  . Sulfa Antibiotics Rash  . Milk-Related Compounds Other (See Comments)    Upset stomach    Consultations:  GI--[facility]   Procedures/Studies: Dg Chest 2 View  Result Date: 05/18/2017 CLINICAL DATA:  Generalized chest pain since wakening. EXAM: CHEST  2 VIEW COMPARISON:  04/27/2015 FINDINGS: Heart size is normal. Chronic aortic atherosclerosis. Chronic pulmonary hyperinflation. Chronic scarring in the right middle lobe. No  sign of infiltrate, collapse or effusion. No pulmonary edema. IMPRESSION: No active disease. Aortic atherosclerosis. Chronic right middle lobe scarring. Pulmonary hyperinflation. Electronically Signed   By: Nelson Chimes M.D.   On: 05/18/2017 09:04   Ct Angio Chest/abd/pel For Dissection W And/or W/wo  Result Date: 05/18/2017 CLINICAL DATA:  Chest and back pain. EXAM: CT ANGIOGRAPHY CHEST, ABDOMEN AND PELVIS TECHNIQUE: Multidetector CT imaging through the chest, abdomen and pelvis was performed using the standard protocol during bolus administration of intravenous contrast. Multiplanar reconstructed images and MIPs were obtained and reviewed to evaluate the vascular anatomy. CONTRAST:  67mL ISOVUE-370 IOPAMIDOL (ISOVUE-370) INJECTION 76% COMPARISON:  CT scan of February 09, 2015. FINDINGS: CTA CHEST FINDINGS Cardiovascular: Atherosclerosis of thoracic aorta is noted without dissection. 4.2 cm ascending thoracic aortic aneurysm is noted. Focal aneurysmal dilatation of distal descending thoracic aorta is noted measuring 4.1 cm. Great vessels are widely patent without significant stenosis. Coronary artery calcifications are noted. No pericardial effusion is noted. Normal cardiac size. Mediastinum/Nodes: No enlarged mediastinal, hilar, or axillary lymph nodes. Thyroid gland, trachea, and esophagus demonstrate no significant findings. Lungs/Pleura: No pneumothorax or pleural effusion is noted. Mild biapical scarring is noted. Mild scarring is noted in the right middle lobe as well as lingular segment of left upper lobe. Musculoskeletal: No chest wall abnormality. No acute or significant osseous findings. Review of the MIP images confirms the above findings. CTA ABDOMEN AND PELVIS FINDINGS VASCULAR Aorta: Atherosclerosis of  abdominal aorta is noted without dissection. 3.4 cm infrarenal abdominal aortic aneurysm is noted. Celiac: Patent without evidence of aneurysm, dissection, vasculitis or significant stenosis. SMA:  Patent without evidence of aneurysm, dissection, vasculitis or significant stenosis. Renals: Severe stenosis is noted at origin of right renal artery secondary to calcified plaque. Moderate to severe stenosis is noted in proximal portion of left renal artery secondary to calcified plaque. IMA: Patent without evidence of aneurysm, dissection, vasculitis or significant stenosis. Inflow: Patent without evidence of aneurysm, dissection, vasculitis or significant stenosis. Veins: No obvious venous abnormality within the limitations of this arterial phase study. Review of the MIP images confirms the above findings. NON-VASCULAR Hepatobiliary: No focal liver abnormality is seen. No gallstones, gallbladder wall thickening, or biliary dilatation. Pancreas: Unremarkable. No pancreatic ductal dilatation or surrounding inflammatory changes. Spleen: Normal in size without focal abnormality. Adrenals/Urinary Tract: Adrenal glands appear normal. Stable bilateral renal cysts are noted. No hydronephrosis or renal obstruction is noted. Urinary bladder is unremarkable. No renal or ureteral calculi are noted. Stomach/Bowel: Stomach is within normal limits. Appendix appears normal. No evidence of bowel wall thickening, distention, or inflammatory changes. Sigmoid diverticulosis is noted without inflammation. Lymphatic: Aortic atherosclerosis. No enlarged abdominal or pelvic lymph nodes. Reproductive: Prostate is unremarkable. Other: No abdominal wall hernia or abnormality. No abdominopelvic ascites. Musculoskeletal: Status post right hip arthroplasty. No acute abnormality seen in the must skeletal system. Review of the MIP images confirms the above findings. IMPRESSION: Atherosclerosis of thoracic and abdominal aorta is noted without dissection. 4.2 cm ascending thoracic aortic aneurysm is noted. 4.1 cm descending thoracic aneurysm is noted. Recommend annual imaging followup by CTA or MRA. This recommendation follows 2010  ACCF/AHA/AATS/ACR/ASA/SCA/SCAI/SIR/STS/SVM Guidelines for the Diagnosis and Management of Patients with Thoracic Aortic Disease. Circulation. 2010; 121: P509-T267. 3.4 cm infrarenal abdominal aortic aneurysm is noted. Recommend followup by ultrasound in 3 years. This recommendation follows ACR consensus guidelines: White Paper of the ACR Incidental Findings Committee II on Vascular Findings. J Am Coll Radiol 2013; 10:789-794. Coronary artery calcifications are noted consistent with coronary artery disease. Moderate to severe stenoses are noted involving the proximal portions of both renal artery secondary to calcified plaque. Sigmoid diverticulosis without inflammation. Electronically Signed   By: Marijo Conception, M.D.   On: 05/18/2017 10:20        Discharge Exam: Vitals:   05/20/17 0650 05/20/17 0955  BP: (!) 170/100 101/64  Pulse: 80 71  Resp: 16   Temp: 98.5 F (36.9 C)   SpO2: 97%    Vitals:   05/19/17 1453 05/19/17 2008 05/20/17 0650 05/20/17 0955  BP: (!) 166/100 (!) 198/97 (!) 170/100 101/64  Pulse: 83 84 80 71  Resp: 17 18 16    Temp: 98.7 F (37.1 C) 98.7 F (37.1 C) 98.5 F (36.9 C)   TempSrc: Oral Oral Oral   SpO2: 97% 98% 97%   Weight:      Height:        General: Pt is alert, awake, not in acute distress Cardiovascular: RRR, S1/S2 +, no rubs, no gallops Respiratory: CTA bilaterally, no wheezing, no rhonchi Abdominal: Soft, NT, ND, bowel sounds + Extremities: no edema, no cyanosis   The results of significant diagnostics from this hospitalization (including imaging, microbiology, ancillary and laboratory) are listed below for reference.    Significant Diagnostic Studies: Dg Chest 2 View  Result Date: 05/18/2017 CLINICAL DATA:  Generalized chest pain since wakening. EXAM: CHEST  2 VIEW COMPARISON:  04/27/2015 FINDINGS: Heart size is normal. Chronic  aortic atherosclerosis. Chronic pulmonary hyperinflation. Chronic scarring in the right middle lobe. No sign of  infiltrate, collapse or effusion. No pulmonary edema. IMPRESSION: No active disease. Aortic atherosclerosis. Chronic right middle lobe scarring. Pulmonary hyperinflation. Electronically Signed   By: Nelson Chimes M.D.   On: 05/18/2017 09:04   Ct Angio Chest/abd/pel For Dissection W And/or W/wo  Result Date: 05/18/2017 CLINICAL DATA:  Chest and back pain. EXAM: CT ANGIOGRAPHY CHEST, ABDOMEN AND PELVIS TECHNIQUE: Multidetector CT imaging through the chest, abdomen and pelvis was performed using the standard protocol during bolus administration of intravenous contrast. Multiplanar reconstructed images and MIPs were obtained and reviewed to evaluate the vascular anatomy. CONTRAST:  5mL ISOVUE-370 IOPAMIDOL (ISOVUE-370) INJECTION 76% COMPARISON:  CT scan of February 09, 2015. FINDINGS: CTA CHEST FINDINGS Cardiovascular: Atherosclerosis of thoracic aorta is noted without dissection. 4.2 cm ascending thoracic aortic aneurysm is noted. Focal aneurysmal dilatation of distal descending thoracic aorta is noted measuring 4.1 cm. Great vessels are widely patent without significant stenosis. Coronary artery calcifications are noted. No pericardial effusion is noted. Normal cardiac size. Mediastinum/Nodes: No enlarged mediastinal, hilar, or axillary lymph nodes. Thyroid gland, trachea, and esophagus demonstrate no significant findings. Lungs/Pleura: No pneumothorax or pleural effusion is noted. Mild biapical scarring is noted. Mild scarring is noted in the right middle lobe as well as lingular segment of left upper lobe. Musculoskeletal: No chest wall abnormality. No acute or significant osseous findings. Review of the MIP images confirms the above findings. CTA ABDOMEN AND PELVIS FINDINGS VASCULAR Aorta: Atherosclerosis of abdominal aorta is noted without dissection. 3.4 cm infrarenal abdominal aortic aneurysm is noted. Celiac: Patent without evidence of aneurysm, dissection, vasculitis or significant stenosis. SMA: Patent  without evidence of aneurysm, dissection, vasculitis or significant stenosis. Renals: Severe stenosis is noted at origin of right renal artery secondary to calcified plaque. Moderate to severe stenosis is noted in proximal portion of left renal artery secondary to calcified plaque. IMA: Patent without evidence of aneurysm, dissection, vasculitis or significant stenosis. Inflow: Patent without evidence of aneurysm, dissection, vasculitis or significant stenosis. Veins: No obvious venous abnormality within the limitations of this arterial phase study. Review of the MIP images confirms the above findings. NON-VASCULAR Hepatobiliary: No focal liver abnormality is seen. No gallstones, gallbladder wall thickening, or biliary dilatation. Pancreas: Unremarkable. No pancreatic ductal dilatation or surrounding inflammatory changes. Spleen: Normal in size without focal abnormality. Adrenals/Urinary Tract: Adrenal glands appear normal. Stable bilateral renal cysts are noted. No hydronephrosis or renal obstruction is noted. Urinary bladder is unremarkable. No renal or ureteral calculi are noted. Stomach/Bowel: Stomach is within normal limits. Appendix appears normal. No evidence of bowel wall thickening, distention, or inflammatory changes. Sigmoid diverticulosis is noted without inflammation. Lymphatic: Aortic atherosclerosis. No enlarged abdominal or pelvic lymph nodes. Reproductive: Prostate is unremarkable. Other: No abdominal wall hernia or abnormality. No abdominopelvic ascites. Musculoskeletal: Status post right hip arthroplasty. No acute abnormality seen in the must skeletal system. Review of the MIP images confirms the above findings. IMPRESSION: Atherosclerosis of thoracic and abdominal aorta is noted without dissection. 4.2 cm ascending thoracic aortic aneurysm is noted. 4.1 cm descending thoracic aneurysm is noted. Recommend annual imaging followup by CTA or MRA. This recommendation follows 2010  ACCF/AHA/AATS/ACR/ASA/SCA/SCAI/SIR/STS/SVM Guidelines for the Diagnosis and Management of Patients with Thoracic Aortic Disease. Circulation. 2010; 121: D532-D924. 3.4 cm infrarenal abdominal aortic aneurysm is noted. Recommend followup by ultrasound in 3 years. This recommendation follows ACR consensus guidelines: White Paper of the ACR Incidental Findings Committee II on Vascular  Findings. J Am Coll Radiol 2013; 10:789-794. Coronary artery calcifications are noted consistent with coronary artery disease. Moderate to severe stenoses are noted involving the proximal portions of both renal artery secondary to calcified plaque. Sigmoid diverticulosis without inflammation. Electronically Signed   By: Marijo Conception, M.D.   On: 05/18/2017 10:20     Microbiology: No results found for this or any previous visit (from the past 240 hour(s)).   Labs: Basic Metabolic Panel: Recent Labs  Lab 05/18/17 0829 05/19/17 0745  NA 138 136  K 3.8 3.8  CL 101 102  CO2 25 23  GLUCOSE 102* 91  BUN 19 14  CREATININE 1.09 1.03  CALCIUM 9.1 8.6*   Liver Function Tests: No results for input(s): AST, ALT, ALKPHOS, BILITOT, PROT, ALBUMIN in the last 168 hours. No results for input(s): LIPASE, AMYLASE in the last 168 hours. No results for input(s): AMMONIA in the last 168 hours. CBC: Recent Labs  Lab 05/18/17 0829 05/19/17 0745 05/20/17 0504  WBC 8.8 5.6 7.3  NEUTROABS 6.7  --   --   HGB 12.1* 11.0* 11.3*  HCT 38.0* 34.0* 35.7*  MCV 92.0 91.4 91.3  PLT 143* 128* 129*   Cardiac Enzymes: Recent Labs  Lab 05/18/17 0829 05/18/17 1128 05/18/17 1725 05/18/17 2324  TROPONINI <0.03 <0.03 <0.03 0.03*   BNP: Invalid input(s): POCBNP CBG: Recent Labs  Lab 05/18/17 0848  GLUCAP 83    Time coordinating discharge:  Greater than 30 minutes  Signed:  Orson Eva, DO [facility] Pager: [phone] 05/20/2017, 11:21 AM

## 2017-05-20 NOTE — Progress Notes (Signed)
Patient IV removed, telemetry removed, tolerated well. Patient and family given discharge instructions at bedside.  

## 2017-08-25 ENCOUNTER — Telehealth: Payer: Self-pay | Admitting: Student

## 2017-08-25 NOTE — Telephone Encounter (Signed)
Xarelto dosing is calculated based off of his creatinine clearance. In calculating this off of his recent labs and weight at time of his hospitalization in 04/2017, he should be on 15mg  daily as his creatinine clearance is 40 mL/min by my calculations and 15mg  daily dosing is recommended if this is < 50. Unless there are has been a significant change in his body habitus or kidney function, he should resume 15mg  daily dosing.   Thanks,  [place]

## 2017-08-25 NOTE — Telephone Encounter (Signed)
Patient understands why he needs to remain on Xarelto 15 mg dose and agrees to stay at lower dose

## 2017-08-25 NOTE — Telephone Encounter (Signed)
Dr. [place] Crane changed pt's Xarelto to 20mg 's instead of the 2's. He wants to make sure it's ok that he can takes those, they gave him lots of samples. Said it's ok to leave message w/ wife if he's not home

## 2017-09-11 NOTE — Progress Notes (Signed)
Cardiology Office Note    Date:  09/12/2017   ID:  Jorge Nixon, Jorge Nixon 06-02-1924, MRN [id number]  PCP:  Celene Squibb, MD  Cardiologist: Previously followed by Dr. Caryl Comes and Dr. [name]; Followed by Dr. Bronson Ing during admission in 04/2017  Chief Complaint  Patient presents with  . Follow-up    6 month visit    History of Present Illness:    Jorge Nixon is a 84 y.o. male with past medical history of PAF (on Xarelto), HTN, HLD, Hypothyroidism, carotid artery stenisis (s/p L CEA in 2014), thoracic aortic aneurysm, AAA and prior TIA/CVA who presents to the office today for 57-month follow-up.   He was last examined by Jory Sims, DNP in 02/2017 and reported overall doing well at that time, denying any recent cardiac issues. He was continued on his current medication regimen, including Xarelto for anticoagulation.   In the interim, he was admitted to [facility] in 04/2017 for evaluation of chest pain which was sharp in etiology and worse with deep breathing. Cyclic cardiac enzymes remained negative and his echocardiogram showed a preserved EF of 50-55% with no regional WMA, therefore further cardiac evaluation was not pursued.   In talking with the patient today, he reports overall doing well since his hospitalization in 04/2017. He denies any repeat episodes of chest pain or dyspnea on exertion. He is active at baseline and walks over a mile per day in his neighborhood. Also reports he just pushed mowed his yard 2 days ago without any symptoms. He denies any recent orthopnea, PND, or lower extremity edema. No recent lightheadedness, dizziness, or presyncope. Does have pain along his left knee with activity and uses a cane as needed.   He does check his BP when at [facility] and says this has been in the 110's to 120's/80's. BP is elevated at 152/76 during today's visit but he reports having not yet taken his blood pressure medication. He remains on Xarelto for anticoagulation and denies any  evidence of active bleeding.   Past Medical History:  Diagnosis Date  . Anemia   . Arthritis   . BPH (benign prostatic hyperplasia)   . Carotid disease, bilateral ([place])    a. s/p L CEA in 2014. b. Duplex 05/2016: Right 40-59% ICA stenosis; Left 1-39% ICA stenosis  . CKD (chronic kidney disease), stage III ([place])   . COPD (chronic obstructive pulmonary disease) ([place])   . Diverticular disease 02/14/2011   problems swallowing  . DVT (deep venous thrombosis) ([place])   . Esophageal dysmotility   . Esophageal web   . GERD (gastroesophageal reflux disease)   . Headache(784.0)   . History of blood transfusion   . Hyperlipidemia   . Hypertension   . Hypothyroidism   . Kidney stone   . Nephrolithiasis    lithotripsy 2006  . Non-ischemic cardiomyopathy ([place])    a. Prior EF 45%, normal nuc 2012. b. EF 50-55% in 2018.  [name] PAF (paroxysmal atrial fibrillation) ([place])    a. identified on monitor 06/2016.  [name] Pneumonia    hx of - hospitalized  . Stroke ([place]) 05/2016  . TIA (transient ischemic attack)     Past Surgical History:  Procedure Laterality Date  . CAROTID ENDARTERECTOMY Left 03-11-13   cea  . carpel tunnel Right   . ENDARTERECTOMY Left 03/11/2013   Procedure: ENDARTERECTOMY CAROTID with Vascu-Guard Bovine Patch.;  Surgeon: Conrad Grant, MD;  Location: [facility];  Service: Vascular;  Laterality: Left;  .  EP IMPLANTABLE DEVICE N/A 06/14/2016   Procedure: Loop Recorder Insertion;  Surgeon: Will Meredith Leeds, MD;  Location: [facility];  Service: Cardiovascular;  Laterality: N/A;  . JOINT REPLACEMENT Right    hip  . JOINT REPLACEMENT Left    knee  . KNEE ARTHROSCOPY Right 2010  . LOOP RECORDER REMOVAL N/A 08/23/2016   Procedure: Loop Recorder Removal;  Surgeon: Will Meredith Leeds, MD;  Location: [facility];  Service: Cardiovascular;  Laterality: N/A;  . REPLACEMENT TOTAL KNEE Left 2006   left knee  . TOTAL HIP ARTHROPLASTY Right august 6th 2012   right side   .  TRANSURETHRAL RESECTION OF PROSTATE  1996    Current Medications: Outpatient Medications Prior to Visit  Medication Sig Dispense Refill  . acetaminophen (TYLENOL) 500 MG tablet Take 500 mg by mouth daily as needed for headache.    . docusate sodium (COLACE) 100 MG capsule Take 100 mg by mouth at bedtime.    [name] levothyroxine (SYNTHROID, LEVOTHROID) 88 MCG tablet Take 88 mcg by mouth daily.     . Multiple Vitamins-Minerals (CENTRUM ADULTS PO) Take 1 tablet by mouth daily.    . Nutritional Supplements (ENSURE COMPLETE PO) Take 237 mLs by mouth See admin instructions. Drink one can (237 mls) by mouth every other night    . Psyllium (METAMUCIL PO) Take 2 scoop by mouth at bedtime. Mix in liquid and drink    . Rivaroxaban (XARELTO) 15 MG TABS tablet Take 1 tablet (15 mg total) by mouth daily with supper. 90 tablet 3  . senna (SENOKOT) 8.6 MG TABS tablet Take 1 tablet by mouth at bedtime.     No facility-administered medications prior to visit.      Allergies:   Sulfa antibiotics and Milk-related compounds   Social History   Socioeconomic History  . Marital status: Married    Spouse name: Not on file  . Number of children: 1  . Years of education: Not on file  . Highest education level: Not on file  Occupational History  . Occupation: Retired [employer]  . Financial resource strain: Not on file  . Food insecurity:    Worry: Not on file    Inability: Not on file  . Transportation needs:    Medical: Not on file    Non-medical: Not on file  Tobacco Use  . Smoking status: Never Smoker  . Smokeless tobacco: Never Used  Substance and Sexual Activity  . Alcohol use: No    Alcohol/week: 0.0 oz  . Drug use: No  . Sexual activity: Not on file  Lifestyle  . Physical activity:    Days per week: Not on file    Minutes per session: Not on file  . Stress: Not on file  Relationships  . Social connections:    Talks on phone: Not on file    Gets together: Not on file     Attends religious service: Not on file    Active member of club or organization: Not on file    Attends meetings of clubs or organizations: Not on file    Relationship status: Not on file  Other Topics Concern  . Not on file  Social History Narrative  . Not on file     Family History:  The patient's family history includes Diabetes in his unknown relative; Heart attack in his father; Hypertension in his mother and unknown relative; Stroke in his father.   Review of Systems:  Please see the history of present illness.     General:  No chills, fever, night sweats or weight changes. Positive for left knee pain.  Cardiovascular:  No chest pain, dyspnea on exertion, edema, orthopnea, palpitations, paroxysmal nocturnal dyspnea. Dermatological: No rash, lesions/masses Respiratory: No cough, dyspnea Urologic: No hematuria, dysuria Abdominal:   No nausea, vomiting, diarrhea, bright red blood per rectum, melena, or hematemesis Neurologic:  No visual changes, wkns, changes in mental status. All other systems reviewed and are otherwise negative except as noted above.   Physical Exam:    VS:  BP (!) 152/76   Pulse 84   Ht 5\' 10"  (1.778 m)   Wt 133 lb (60.3 kg)   SpO2 96%   BMI 19.08 kg/m    General: Well developed, elderly Caucasian male appearing in no acute distress. Head: Normocephalic, atraumatic, sclera non-icteric, no xanthomas, nares are without discharge.  Neck: No carotid bruits. JVD not elevated.  Lungs: Respirations regular and unlabored, without wheezes or rales.  Heart: Regular rate and rhythm. No S3 or S4.  No murmur, no rubs, or gallops appreciated. Abdomen: Soft, non-tender, non-distended with normoactive bowel sounds. No hepatomegaly. No rebound/guarding. No obvious abdominal masses. Msk:  Strength and tone appear normal for age. No joint deformities or effusions. Extremities: No clubbing or cyanosis. No lower extremity edema.  Distal pedal pulses are 2+  bilaterally. Neuro: Alert and oriented X 3. Moves all extremities spontaneously. No focal deficits noted. Psych:  Responds to questions appropriately with a normal affect. Skin: No rashes or lesions noted  Wt Readings from Last 3 Encounters:  09/12/17 133 lb (60.3 kg)  05/18/17 130 lb 15.3 oz (59.4 kg)  03/16/17 135 lb (61.2 kg)     Studies/Labs Reviewed:   EKG:  EKG is not ordered today.   Recent Labs: 05/19/2017: BUN 14; Creatinine, Ser 1.03; Potassium 3.8; Sodium 136 05/20/2017: Hemoglobin 11.3; Platelets 129   Lipid Panel    Component Value Date/Time   CHOL 87 05/19/2017 0745   TRIG 32 05/19/2017 0745   HDL 34 (L) 05/19/2017 0745   CHOLHDL 2.6 05/19/2017 0745   VLDL 6 05/19/2017 0745   LDLCALC 47 05/19/2017 0745    Additional studies/ records that were reviewed today include:   Echocardiogram: 04/2017 Study Conclusions  - Left ventricle: The cavity size was normal. Wall thickness was   increased in a pattern of moderate LVH. Systolic function was   normal. The estimated ejection fraction was in the range of 50%   to 55%. Although no diagnostic regional wall motion abnormality   was identified, this possibility cannot be completely excluded on   the basis of this study. - Aortic valve: Mildly calcified annulus. Trileaflet. There was   trivial regurgitation. - Mitral valve: There was mild regurgitation. - Right atrium: The atrium was mildly dilated. Central venous   pressure (est): 3 mm Hg. - Tricuspid valve: There was mild regurgitation. - Pulmonary arteries: PA peak pressure: 34 mm Hg (S). - Pericardium, extracardiac: There was no pericardial effusion.  Impressions:  - Moderate LVH with LVEF 50-55%. Indeterminate diastolic function.   Mildly calcified aortic annulus with trivial aortic   regurgitation. Mild mitral regurgitation. Mildly dilated right   atrium. Mild tricuspid regurgitation with PASP estimated 34 mmHg.   CTA:  04/2017 IMPRESSION: Atherosclerosis of thoracic and abdominal aorta is noted without dissection.  4.2 cm ascending thoracic aortic aneurysm is noted. 4.1 cm descending thoracic aneurysm is noted. Recommend annual imaging followup by CTA or  MRA. This recommendation follows 2010 ACCF/AHA/AATS/ACR/ASA/SCA/SCAI/SIR/STS/SVM Guidelines for the Diagnosis and Management of Patients with Thoracic Aortic Disease. Circulation. 2010; 121: Z610-R604.  3.4 cm infrarenal abdominal aortic aneurysm is noted. Recommend followup by ultrasound in 3 years. This recommendation follows ACR consensus guidelines: White Paper of the ACR Incidental Findings Committee II on Vascular Findings. J Am Coll Radiol 2013; 10:789-794.  Coronary artery calcifications are noted consistent with coronary artery disease.  Moderate to severe stenoses are noted involving the proximal portions of both renal artery secondary to calcified plaque.  Sigmoid diverticulosis without inflammation.  Assessment:    1. Paroxysmal atrial fibrillation (HCC)   2. Essential hypertension   3. Hyperlipidemia LDL goal <70   4. Bilateral carotid artery stenosis   5. Thoracic aortic aneurysm without rupture ([place])      Plan:   In order of problems listed above:  1. Paroxysmal Atrial Fibrillation - He denies any recent palpitations or dyspnea on exertion. Maintaining NSR by examination today. Not on any AV nodal blocking agents.  - denies any evidence of active bleeding. Remains on Xarelto for anticoagulation.  2. HTN - BP is elevated at 152/76 during today's visit. Reports this is well-controlled when checked in the ambulatory setting. Will continue Hyzaar at current dosing.   3. HLD - followed by PCP. Goal LDL < 70 with known PAD.  - Remains on Atorvastatin 20mg  daily.   4. Carotid artery stenosis - s/p L CEA in 2014. Carotid Dopplers followed by Vascular Surgery.  5. Thoracic aortic aneurysm/ AAA - CTA in 04/2017  showed a 4.2 cm ascending thoracic aortic aneurysm with annual imaging followup by CTA or MRA recommended. Also noted to have a 3.4 cm infrarenal abdominal aortic aneurysm with follow-up US in 3 years recommended.     Medication Adjustments/Labs and Tests Ordered: Current medicines are reviewed at length with the patient today.  Concerns regarding medicines are outlined above.  Medication changes, Labs and Tests ordered today are listed in the Patient Instructions below. Patient Instructions  Medication Instructions:  Your physician recommends that you continue on your current medications as directed. Please refer to the Current Medication list given to you today.   Labwork: NONE   Testing/Procedures: NONE   Follow-Up: Your physician wants you to follow-up in: 6 Months. You will receive a reminder letter in the mail two months in advance. If you don't receive a letter, please call our office to schedule the follow-up appointment.  Any Other Special Instructions Will Be Listed Below (If Applicable).  If you need a refill on your cardiac medications before your next appointment, please call your pharmacy.  Thank you for choosing [facility]!    Signed, Erma Heritage, PA-C  09/12/2017 3:13 PM    [facility] [address] [place], [place] [address] Phone: [phone]

## 2017-09-12 ENCOUNTER — Encounter: Payer: Self-pay | Admitting: Student

## 2017-09-12 ENCOUNTER — Ambulatory Visit (INDEPENDENT_AMBULATORY_CARE_PROVIDER_SITE_OTHER): Payer: Medicare Other | Admitting: Student

## 2017-09-12 VITALS — BP 152/76 | HR 84 | Ht 70.0 in | Wt 133.0 lb

## 2017-09-12 DIAGNOSIS — I480 Paroxysmal atrial fibrillation: Secondary | ICD-10-CM

## 2017-09-12 DIAGNOSIS — I712 Thoracic aortic aneurysm, without rupture, unspecified: Secondary | ICD-10-CM

## 2017-09-12 DIAGNOSIS — I10 Essential (primary) hypertension: Secondary | ICD-10-CM

## 2017-09-12 DIAGNOSIS — E785 Hyperlipidemia, unspecified: Secondary | ICD-10-CM

## 2017-09-12 DIAGNOSIS — I6523 Occlusion and stenosis of bilateral carotid arteries: Secondary | ICD-10-CM

## 2017-09-12 MED ORDER — LOSARTAN POTASSIUM-HCTZ 50-12.5 MG PO TABS: 0.5000 | ORAL_TABLET | Freq: Every day | ORAL | Status: DC

## 2017-09-12 MED ORDER — ATORVASTATIN CALCIUM 20 MG PO TABS: 20.0000 mg | ORAL_TABLET | Freq: Every day | ORAL | Status: DC

## 2017-09-12 NOTE — Patient Instructions (Addendum)
Medication Instructions:  Your physician recommends that you continue on your current medications as directed. Please refer to the Current Medication list given to you today.   Labwork: NONE   Testing/Procedures: NONE   Follow-Up: Your physician wants you to follow-up in: 6 Months. You will receive a reminder letter in the mail two months in advance. If you don't receive a letter, please call our office to schedule the follow-up appointment.   Any Other Special Instructions Will Be Listed Below (If Applicable).     If you need a refill on your cardiac medications before your next appointment, please call your pharmacy.  Thank you for choosing [facility]!   

## 2017-09-13 ENCOUNTER — Other Ambulatory Visit: Payer: Self-pay

## 2017-09-13 ENCOUNTER — Emergency Department (HOSPITAL_COMMUNITY): Payer: Medicare Other

## 2017-09-13 ENCOUNTER — Inpatient Hospital Stay (HOSPITAL_COMMUNITY)
Admission: EM | Admit: 2017-09-13 | Discharge: 2017-09-14 | DRG: 377 | Disposition: A | Discharge status: Home / Self Care | Payer: Medicare Other | Attending: Family Medicine | Admitting: Family Medicine

## 2017-09-13 ENCOUNTER — Encounter (HOSPITAL_COMMUNITY): Payer: Self-pay | Admitting: Emergency Medicine

## 2017-09-13 DIAGNOSIS — I701 Atherosclerosis of renal artery: Secondary | ICD-10-CM | POA: Diagnosis present

## 2017-09-13 DIAGNOSIS — Z79890 Hormone replacement therapy (postmenopausal): Secondary | ICD-10-CM

## 2017-09-13 DIAGNOSIS — I714 Abdominal aortic aneurysm, without rupture, unspecified: Secondary | ICD-10-CM | POA: Diagnosis present

## 2017-09-13 DIAGNOSIS — Z7901 Long term (current) use of anticoagulants: Secondary | ICD-10-CM | POA: Diagnosis not present

## 2017-09-13 DIAGNOSIS — K219 Gastro-esophageal reflux disease without esophagitis: Secondary | ICD-10-CM | POA: Diagnosis present

## 2017-09-13 DIAGNOSIS — J449 Chronic obstructive pulmonary disease, unspecified: Secondary | ICD-10-CM | POA: Diagnosis present

## 2017-09-13 DIAGNOSIS — E039 Hypothyroidism, unspecified: Secondary | ICD-10-CM | POA: Diagnosis not present

## 2017-09-13 DIAGNOSIS — Z8701 Personal history of pneumonia (recurrent): Secondary | ICD-10-CM

## 2017-09-13 DIAGNOSIS — I10 Essential (primary) hypertension: Secondary | ICD-10-CM | POA: Diagnosis not present

## 2017-09-13 DIAGNOSIS — R202 Paresthesia of skin: Secondary | ICD-10-CM | POA: Diagnosis not present

## 2017-09-13 DIAGNOSIS — D62 Acute posthemorrhagic anemia: Secondary | ICD-10-CM | POA: Diagnosis not present

## 2017-09-13 DIAGNOSIS — Z96652 Presence of left artificial knee joint: Secondary | ICD-10-CM | POA: Diagnosis present

## 2017-09-13 DIAGNOSIS — R531 Weakness: Secondary | ICD-10-CM | POA: Diagnosis not present

## 2017-09-13 DIAGNOSIS — N183 Chronic kidney disease, stage 3 (moderate): Secondary | ICD-10-CM | POA: Diagnosis not present

## 2017-09-13 DIAGNOSIS — I129 Hypertensive chronic kidney disease with stage 1 through stage 4 chronic kidney disease, or unspecified chronic kidney disease: Secondary | ICD-10-CM | POA: Diagnosis present

## 2017-09-13 DIAGNOSIS — I428 Other cardiomyopathies: Secondary | ICD-10-CM | POA: Diagnosis present

## 2017-09-13 DIAGNOSIS — Z882 Allergy status to sulfonamides status: Secondary | ICD-10-CM

## 2017-09-13 DIAGNOSIS — Z96641 Presence of right artificial hip joint: Secondary | ICD-10-CM | POA: Diagnosis present

## 2017-09-13 DIAGNOSIS — E785 Hyperlipidemia, unspecified: Secondary | ICD-10-CM | POA: Diagnosis not present

## 2017-09-13 DIAGNOSIS — D696 Thrombocytopenia, unspecified: Secondary | ICD-10-CM | POA: Diagnosis not present

## 2017-09-13 DIAGNOSIS — I712 Thoracic aortic aneurysm, without rupture: Secondary | ICD-10-CM | POA: Diagnosis present

## 2017-09-13 DIAGNOSIS — Z681 Body mass index (BMI) 19 or less, adult: Secondary | ICD-10-CM

## 2017-09-13 DIAGNOSIS — Z8673 Personal history of transient ischemic attack (TIA), and cerebral infarction without residual deficits: Secondary | ICD-10-CM

## 2017-09-13 DIAGNOSIS — Z79899 Other long term (current) drug therapy: Secondary | ICD-10-CM

## 2017-09-13 DIAGNOSIS — R079 Chest pain, unspecified: Secondary | ICD-10-CM | POA: Diagnosis present

## 2017-09-13 DIAGNOSIS — R0789 Other chest pain: Secondary | ICD-10-CM | POA: Diagnosis not present

## 2017-09-13 DIAGNOSIS — Z9889 Other specified postprocedural states: Secondary | ICD-10-CM

## 2017-09-13 DIAGNOSIS — I480 Paroxysmal atrial fibrillation: Secondary | ICD-10-CM | POA: Diagnosis not present

## 2017-09-13 DIAGNOSIS — Z86718 Personal history of other venous thrombosis and embolism: Secondary | ICD-10-CM

## 2017-09-13 DIAGNOSIS — Z95818 Presence of other cardiac implants and grafts: Secondary | ICD-10-CM

## 2017-09-13 DIAGNOSIS — E43 Unspecified severe protein-calorie malnutrition: Secondary | ICD-10-CM | POA: Diagnosis not present

## 2017-09-13 DIAGNOSIS — K228 Other specified diseases of esophagus: Secondary | ICD-10-CM | POA: Diagnosis not present

## 2017-09-13 DIAGNOSIS — R1013 Epigastric pain: Secondary | ICD-10-CM | POA: Diagnosis present

## 2017-09-13 DIAGNOSIS — K920 Hematemesis: Secondary | ICD-10-CM | POA: Diagnosis not present

## 2017-09-13 DIAGNOSIS — Z91011 Allergy to milk products: Secondary | ICD-10-CM

## 2017-09-13 LAB — CBC
HEMATOCRIT: 36.7 % — AB (ref 39.0–52.0)
Hemoglobin: 11.8 g/dL — ABNORMAL LOW (ref 13.0–17.0)
MCH: 29.5 pg (ref 26.0–34.0)
MCHC: 32.2 g/dL (ref 30.0–36.0)
MCV: 91.8 fL (ref 78.0–100.0)
Platelets: 142 10*3/uL — ABNORMAL LOW (ref 150–400)
RBC: 4 MIL/uL — ABNORMAL LOW (ref 4.22–5.81)
RDW: 14.3 % (ref 11.5–15.5)
WBC: 5.1 10*3/uL (ref 4.0–10.5)

## 2017-09-13 LAB — DIFFERENTIAL
Basophils Absolute: 0 10*3/uL (ref 0.0–0.1)
Basophils Relative: 1 %
EOS PCT: 3 %
Eosinophils Absolute: 0.2 10*3/uL (ref 0.0–0.7)
LYMPHS PCT: 28 %
Lymphs Abs: 1.4 10*3/uL (ref 0.7–4.0)
MONO ABS: 0.5 10*3/uL (ref 0.1–1.0)
MONOS PCT: 10 %
Neutro Abs: 3 10*3/uL (ref 1.7–7.7)
Neutrophils Relative %: 58 %

## 2017-09-13 LAB — I-STAT TROPONIN, ED: Troponin i, poc: 0 ng/mL (ref 0.00–0.08)

## 2017-09-13 LAB — TROPONIN I

## 2017-09-13 LAB — BASIC METABOLIC PANEL
Anion gap: 10 (ref 5–15)
BUN: 17 mg/dL (ref 6–20)
CALCIUM: 9.2 mg/dL (ref 8.9–10.3)
CO2: 25 mmol/L (ref 22–32)
Chloride: 104 mmol/L (ref 101–111)
Creatinine, Ser: 1.1 mg/dL (ref 0.61–1.24)
GFR calc Af Amer: >60 mL/min (ref >60)
GFR, EST NON AFRICAN AMERICAN: 57 mL/min — AB (ref >60)
GLUCOSE: 91 mg/dL (ref 65–99)
POTASSIUM: 4.1 mmol/L (ref 3.5–5.1)
Sodium: 139 mmol/L (ref 135–145)

## 2017-09-13 LAB — HEMOGLOBIN AND HEMATOCRIT, BLOOD
HCT: 43.1 % (ref 39.0–52.0)
Hemoglobin: 14 g/dL (ref 13.0–17.0)

## 2017-09-13 LAB — HEPATIC FUNCTION PANEL
ALK PHOS: 69 U/L (ref 38–126)
ALT: 17 U/L (ref 17–63)
AST: 37 U/L (ref 15–41)
Albumin: 4.1 g/dL (ref 3.5–5.0)
BILIRUBIN DIRECT: 0.2 mg/dL (ref 0.1–0.5)
BILIRUBIN INDIRECT: 0.6 mg/dL (ref 0.3–0.9)
BILIRUBIN TOTAL: 0.8 mg/dL (ref 0.3–1.2)
TOTAL PROTEIN: 7.1 g/dL (ref 6.5–8.1)

## 2017-09-13 MED ORDER — TRAZODONE HCL 50 MG PO TABS: 25.0000 mg | ORAL_TABLET | Freq: Every evening | ORAL | Status: DC | PRN

## 2017-09-13 MED ORDER — PSYLLIUM 95 % PO PACK
1.0000 | PACK | Freq: Every day | ORAL | Status: DC
  Administered 2017-09-14: 1 via ORAL
  Filled 2017-09-13: qty 1

## 2017-09-13 MED ORDER — DOCUSATE SODIUM 100 MG PO CAPS
100.0000 mg | ORAL_CAPSULE | Freq: Every day | ORAL | Status: DC
  Filled 2017-09-13: qty 1

## 2017-09-13 MED ORDER — ACETAMINOPHEN 650 MG RE SUPP: 650.0000 mg | Freq: Four times a day (QID) | RECTAL | Status: DC | PRN

## 2017-09-13 MED ORDER — IOPAMIDOL (ISOVUE-300) INJECTION 61%
100.0000 mL | Freq: Once | INTRAVENOUS | Status: AC | PRN
  Administered 2017-09-13: 100 mL via INTRAVENOUS

## 2017-09-13 MED ORDER — ATORVASTATIN CALCIUM 20 MG PO TABS
20.0000 mg | ORAL_TABLET | Freq: Every day | ORAL | Status: DC
  Administered 2017-09-13 – 2017-09-14 (×2): 20 mg via ORAL
  Filled 2017-09-13 (×2): qty 1

## 2017-09-13 MED ORDER — HYDROCHLOROTHIAZIDE 10 MG/ML ORAL SUSPENSION: 6.2500 mg | Freq: Every day | ORAL | Status: DC

## 2017-09-13 MED ORDER — LOSARTAN POTASSIUM 50 MG PO TABS
25.0000 mg | ORAL_TABLET | Freq: Every day | ORAL | Status: DC
  Administered 2017-09-13 – 2017-09-14 (×2): 25 mg via ORAL
  Filled 2017-09-13 (×2): qty 1

## 2017-09-13 MED ORDER — SODIUM CHLORIDE 0.9% FLUSH: 3.0000 mL | INTRAVENOUS | Status: DC | PRN

## 2017-09-13 MED ORDER — SENNA 8.6 MG PO TABS
1.0000 | ORAL_TABLET | Freq: Every day | ORAL | Status: DC
  Filled 2017-09-13: qty 1

## 2017-09-13 MED ORDER — LOSARTAN POTASSIUM-HCTZ 50-12.5 MG PO TABS: 0.5000 | ORAL_TABLET | Freq: Every day | ORAL | Status: DC

## 2017-09-13 MED ORDER — ONDANSETRON HCL 4 MG PO TABS: 4.0000 mg | ORAL_TABLET | Freq: Four times a day (QID) | ORAL | Status: DC | PRN

## 2017-09-13 MED ORDER — SODIUM CHLORIDE 0.9 % IV SOLN: 250.0000 mL | INTRAVENOUS | Status: DC | PRN

## 2017-09-13 MED ORDER — LEVOTHYROXINE SODIUM 88 MCG PO TABS
88.0000 ug | ORAL_TABLET | Freq: Every day | ORAL | Status: DC
  Administered 2017-09-13 – 2017-09-14 (×2): 88 ug via ORAL
  Filled 2017-09-13 (×2): qty 1

## 2017-09-13 MED ORDER — ENSURE ENLIVE PO LIQD
237.0000 mL | Freq: Three times a day (TID) | ORAL | Status: DC
  Filled 2017-09-13 (×4): qty 237

## 2017-09-13 MED ORDER — ONDANSETRON HCL 4 MG/2ML IJ SOLN: 4.0000 mg | Freq: Four times a day (QID) | INTRAMUSCULAR | Status: DC | PRN

## 2017-09-13 MED ORDER — HYDROCHLOROTHIAZIDE 12.5 MG PO TABS
6.2500 mg | ORAL_TABLET | Freq: Every day | ORAL | Status: DC
  Administered 2017-09-14: 6.25 mg via ORAL
  Filled 2017-09-13 (×3): qty 1

## 2017-09-13 MED ORDER — SODIUM CHLORIDE 0.9% FLUSH
3.0000 mL | Freq: Two times a day (BID) | INTRAVENOUS | Status: DC
  Administered 2017-09-13 – 2017-09-14 (×2): 3 mL via INTRAVENOUS

## 2017-09-13 MED ORDER — LOSARTAN POTASSIUM 50 MG PO TABS: 25.0000 mg | ORAL_TABLET | Freq: Every day | ORAL | Status: DC

## 2017-09-13 MED ORDER — PANTOPRAZOLE SODIUM 40 MG IV SOLR
40.0000 mg | Freq: Once | INTRAVENOUS | Status: AC
  Administered 2017-09-13: 40 mg via INTRAVENOUS

## 2017-09-13 MED ORDER — ACETAMINOPHEN 325 MG PO TABS: 650.0000 mg | ORAL_TABLET | Freq: Four times a day (QID) | ORAL | Status: DC | PRN

## 2017-09-13 NOTE — ED Triage Notes (Signed)
Pt c/o central chest pain that started when he woke up radiating to shoulder.  Pt states he got up and started coughing up blood.  Pt on blood thinner.  Saw cardiologist yesterday and states no problem then

## 2017-09-13 NOTE — Consult Note (Signed)
Referring Provider:Clanford Wynetta Emery, MD Primary Care Physician:  Celene Squibb, MD Primary Gastroenterologist:  Dr. Laural Golden  Reason for Consultation:    Patient spitting up blood.  HPI:   Patient is 84 year old Caucasian male with multiple medical problems who is chronically anticoagulated for history of atrial fibrillation bilateral carotid artery disease as well as history of CVA who felt fine when he went to bed last night.  When he woke up this morning around 7 AM he felt there was something in his throat.  He he was able to spit up.  He noted was a clump of blood.  This occurred another 5 or 6 times.  He also noted chest discomfort.  He did not experience nausea vomiting shortness of breath or cough.  Patient came to emergency room.  Chest film did not reveal active disease.  Troponin level was normal.  Dr. Roderic Palau felt that he was having hematemesis rather than hemoptysis.  Dominant pelvic CT was obtained with contrast.  This study revealed extensive atherosclerotic changes and stable 3.4 cm abdominal aortic aneurysm.  Also had bilateral common iliac artery aneurysms. Patient was begun on IV fluids and given single dose of pantoprazole 40 mg IV.  Patient was hospitalized for further evaluation. He has not had any more episodes of spitting up blood.  He just had a bowel movement.  He states he passed formed brown stool.  He denies abdominal pain.  He says his appetite is good.  He has been gradually losing weight.  He complains of left knee pain.  He uses quad cane to ambulate.  He says he walks mild daily for 5 days each week. He is on Xarelto.  Last dose was yesterday evening. Patient was hospitalized in December 2018 for chest pain.  During that admission he was experiencing small volume hematochezia.  He was seen by Dr. Gala Romney was decided to not to do endoscopic evaluation.  His Xarelto was held for a few weeks.  Has not had any more rectal bleeding.  He has chronic constipation.  He states his  bowels move every day as long as he takes Senokot and stool softener. Patient's last EGD was in September 2012 when he was experiencing dysphagia and he was also noted to be anemic and had heme positive stool.  Exam suggested esophageal web which was disrupted by passing 30 [place] Maloney dilator.  He has small sliding hiatal hernia bulbar duodenitis and large duodenal diverticulum at second part of the duodenum.  H. pylori serology was negative.  Patient and his wife live in [place]. There have been married for 20 years.  Patient had 2 children from his first marriage and they both have passed away.  He has 4 stepchildren.  He does not smoke cigarettes or drink alcohol.  He is retired.  He worked at [employer] for over 15 years and also a Customer service manager for a few years.     Past Medical History:  Diagnosis Date  . Anemia   . Arthritis   . BPH (benign prostatic hyperplasia)   . Carotid disease, bilateral ([place])    a. s/p L CEA in 2014. b. Duplex 05/2016: Right 40-59% ICA stenosis; Left 1-39% ICA stenosis  . CKD (chronic kidney disease), stage III ([place])   . COPD (chronic obstructive pulmonary disease) ([place])   . Diverticular disease 02/14/2011   problems swallowing  . DVT (deep venous thrombosis) ([place])   . Esophageal dysmotility   . Esophageal web       .  Headache(784.0)   . History of blood transfusion   . Hyperlipidemia   . Hypertension   . Hypothyroidism   . Kidney stone   . Nephrolithiasis    lithotripsy 2006  . Non-ischemic cardiomyopathy ([place])    a. Prior EF 45%, normal nuc 2012. b. EF 50-55% in 2018.  [name] PAF (paroxysmal atrial fibrillation) ([place])    a. identified on monitor 06/2016.  [name] Pneumonia    hx of - hospitalized  . Stroke ([place]) 05/2016  . TIA (transient ischemic attack)     Past Surgical History:  Procedure Laterality Date  . CAROTID ENDARTERECTOMY Left 03-11-13   cea  . carpel tunnel Right   . ENDARTERECTOMY Left 03/11/2013    Procedure: ENDARTERECTOMY CAROTID with Vascu-Guard Bovine Patch.;  Surgeon: Conrad Spencer, MD;  Location: [facility];  Service: Vascular;  Laterality: Left;  . EP IMPLANTABLE DEVICE N/A 06/14/2016   Procedure: Loop Recorder Insertion;  Surgeon: Will Meredith Leeds, MD;  Location: [facility];  Service: Cardiovascular;  Laterality: N/A;  . JOINT REPLACEMENT Right    hip  . JOINT REPLACEMENT Left    knee  . KNEE ARTHROSCOPY Right 2010  . LOOP RECORDER REMOVAL N/A 08/23/2016   Procedure: Loop Recorder Removal;  Surgeon: Will Meredith Leeds, MD;  Location: [facility];  Service: Cardiovascular;  Laterality: N/A;  . REPLACEMENT TOTAL KNEE Left 2006   left knee  . TOTAL HIP ARTHROPLASTY Right august 6th 2012   right side   . TRANSURETHRAL RESECTION OF PROSTATE  1996    Prior to Admission medications   Medication Sig Start Date End Date Taking? Authorizing Provider  acetaminophen (TYLENOL) 500 MG tablet Take 1,000 mg by mouth daily as needed for headache.    Yes [provider]  atorvastatin (LIPITOR) 20 MG tablet Take 1 tablet (20 mg total) by mouth daily. 09/12/17  Yes Strader, [place] M, PA-C  docusate sodium (COLACE) 100 MG capsule Take 100 mg by mouth at bedtime.   Yes [provider]  levothyroxine (SYNTHROID, LEVOTHROID) 88 MCG tablet Take 88 mcg by mouth daily.  05/29/16  Yes [provider]  losartan-hydrochlorothiazide (HYZAAR) 50-12.5 MG tablet Take 0.5 tablets by mouth daily. Takes 1/2 tablet daily 09/12/17  Yes Strader, [place] M, PA-C  Multiple Vitamins-Minerals (CENTRUM ADULTS PO) Take 1 tablet by mouth daily.   Yes [provider]  Nutritional Supplements (ENSURE COMPLETE PO) Take 237 mLs by mouth See admin instructions. Drink one can (237 mls) by mouth every other night   Yes [provider]  Psyllium (METAMUCIL PO) Take 2 scoop by mouth at bedtime. Mix in liquid and drink   Yes [provider]  Rivaroxaban (XARELTO) 15  MG TABS tablet Take 1 tablet (15 mg total) by mouth daily with supper. 09/21/16  Yes Lendon Colonel, NP  senna (SENOKOT) 8.6 MG TABS tablet Take 1 tablet by mouth at bedtime.   Yes [provider]  doxycycline (VIBRA-TABS) 100 MG tablet Take 100 mg by mouth 2 (two) times daily.    [provider]    Current Facility-Administered Medications  Medication Dose Route Frequency Provider Last Rate Last Dose  . 0.9 %  sodium chloride infusion  250 mL Intravenous PRN Johnson, Clanford L, MD      . acetaminophen (TYLENOL) tablet 650 mg  650 mg Oral Q6H PRN Johnson, Clanford L, MD       Or  . acetaminophen (TYLENOL) suppository 650 mg  650 mg  Rectal Q6H PRN Johnson, Clanford L, MD      . atorvastatin (LIPITOR) tablet 20 mg  20 mg Oral Daily Johnson, Clanford L, MD   20 mg at 09/13/17 1446  . docusate sodium (COLACE) capsule 100 mg  100 mg Oral QHS Johnson, Clanford L, MD      . feeding supplement (ENSURE ENLIVE) (ENSURE ENLIVE) liquid 237 mL  237 mL Oral TID BM Johnson, Clanford L, MD      . hydrochlorothiazide (HYDRODIURIL) tablet 6.25 mg  6.25 mg Oral Daily Johnson, Clanford L, MD       And  . losartan (COZAAR) tablet 25 mg  25 mg Oral Daily Johnson, Clanford L, MD   25 mg at 09/13/17 1445  . levothyroxine (SYNTHROID, LEVOTHROID) tablet 88 mcg  88 mcg Oral QAC breakfast Johnson, Clanford L, MD   88 mcg at 09/13/17 1446  . ondansetron (ZOFRAN) tablet 4 mg  4 mg Oral Q6H PRN Johnson, Clanford L, MD       Or  . ondansetron (ZOFRAN) injection 4 mg  4 mg Intravenous Q6H PRN Johnson, Clanford L, MD      . psyllium (HYDROCIL/METAMUCIL) packet 1 packet  1 packet Oral Daily Johnson, Clanford L, MD      . senna (SENOKOT) tablet 8.6 mg  1 tablet Oral QHS Johnson, Clanford L, MD      . sodium chloride flush (NS) 0.9 % injection 3 mL  3 mL Intravenous Q12H Johnson, Clanford L, MD   3 mL at 09/13/17 1446  . sodium chloride flush (NS) 0.9 % injection 3 mL  3 mL Intravenous PRN Johnson,  Clanford L, MD      . traZODone (DESYREL) tablet 25 mg  25 mg Oral QHS PRN Wynetta Emery, Clanford L, MD        Allergies as of 09/13/2017 - Review Complete 09/13/2017  Allergen Reaction Noted  . Sulfa antibiotics Rash 12/23/2010  . Milk-related compounds Other (See Comments) 03/07/2013    Family History  Problem Relation Age of Onset  . Stroke Father   . Heart attack Father   . Hypertension Mother   . Hypertension Unknown   . Diabetes Unknown     Social History   Socioeconomic History  . Marital status: Married    Spouse name: Not on file  . Number of children: 1  . Years of education: Not on file  . Highest education level: Not on file  Occupational History  . Occupation: Retired [employer]  . Financial resource strain: Not on file  . Food insecurity:    Worry: Not on file    Inability: Not on file  . Transportation needs:    Medical: Not on file    Non-medical: Not on file  Tobacco Use  . Smoking status: Never Smoker  . Smokeless tobacco: Never Used  Substance and Sexual Activity  . Alcohol use: No    Alcohol/week: 0.0 oz  . Drug use: No  . Sexual activity: Not on file  Lifestyle  . Physical activity:    Days per week: Not on file    Minutes per session: Not on file  . Stress: Not on file  Relationships  . Social connections:    Talks on phone: Not on file    Gets together: Not on file    Attends religious service: Not on file    Active member of club or organization: Not on file    Attends meetings of clubs or  organizations: Not on file    Relationship status: Not on file  . Intimate partner violence:    Fear of current or ex partner: Not on file    Emotionally abused: Not on file    Physically abused: Not on file    Forced sexual activity: Not on file  Other Topics Concern  . Not on file  Social History Narrative  . Not on file    Review of Systems: See HPI, otherwise normal ROS  Physical Exam: Temp:  [97.8 F (36.6 C)-98 F  (36.7 C)] 97.8 F (36.6 C) (04/17 1433) Pulse Rate:  [57-77] 63 (04/17 1433) Resp:  [11-20] 18 (04/17 1433) BP: (152-171)/(73-117) 168/81 (04/17 1433) SpO2:  [98 %-100 %] 99 % (04/17 1433) Weight:  [133 lb (60.3 kg)] 133 lb (60.3 kg) (04/17 0818)   Very pleasant thin Caucasian male who is in no acute distress. Conjunctiva is pink.  Sclerae nonicteric. Oropharyngeal mucosa is normal. No neck masses or thyromegaly noted. Scar noted from prior left carotid endarterectomy. Cardiac exam with regular rhythm normal S1 and S2.  No murmur or gallop noted. Lungs are clear to auscultation. Abdomen is flat.  Bowel sounds are normal.  On palpation it is soft with mild midepigastric tenderness.  He has small hernia in left mid abdomen below lateral end of costal margin.  It is completely reducible. Extremities are thin but no clubbing or edema noted.    Lab Results: Recent Labs    09/13/17 0837  WBC 5.1  HGB 11.8*  HCT 36.7*  PLT 142*   BMET Recent Labs    09/13/17 0837  NA 139  K 4.1  CL 104  CO2 25  GLUCOSE 91  BUN 17  CREATININE 1.10  CALCIUM 9.2   LFT Recent Labs    09/13/17 0837  PROT 7.1  ALBUMIN 4.1  AST 37  ALT 17  ALKPHOS 69  BILITOT 0.8  BILIDIR 0.2  IBILI 0.6   PT/INR No results for input(s): LABPROT, INR in the last 72 hours. Hepatitis Panel No results for input(s): HEPBSAG, HCVAB, HEPAIGM, HEPBIGM in the last 72 hours.  Studies/Results: Dg Chest 2 View  Result Date: 09/13/2017 CLINICAL DATA:  Chest pain, shortness of breath. EXAM: CHEST - 2 VIEW COMPARISON:  Radiographs and CT scan of May 18, 2017. FINDINGS: The heart size and mediastinal contours are within normal limits. No pneumothorax or pleural effusion is noted. Atherosclerosis of thoracic aorta is noted. No acute pulmonary disease is noted. Stable right middle lobe scarring is noted. The visualized skeletal structures are unremarkable. IMPRESSION: No active cardiopulmonary disease. Aortic  Atherosclerosis (ICD10-I70.0). Electronically Signed   By: Marijo Conception, M.D.   On: 09/13/2017 09:15   Ct Abdomen Pelvis W Contrast  Result Date: 09/13/2017 CLINICAL DATA:  Central chest pain on waking up this morning. The patient reports hemoptysis. EXAM: CT ABDOMEN AND PELVIS WITH CONTRAST TECHNIQUE: Multidetector CT imaging of the abdomen and pelvis was performed using the standard protocol following bolus administration of intravenous contrast. CONTRAST:  100 ml ISOVUE-300 IOPAMIDOL (ISOVUE-300) INJECTION 61% COMPARISON:  CT angiogram chest, abdomen and pelvis 05/18/2017 and 02/17/2011. FINDINGS: Lower chest: Small focus of airspace opacity and bronchiectasis in the right middle lobe is unchanged since 2012 consistent with some remote infectious or inflammatory process. Lung bases are otherwise clear. No pleural or pericardial effusion. Calcific aortic and coronary atherosclerosis noted. Hepatobiliary: Coarse calcification in the posterior right hepatic lobe is unchanged. The liver is somewhat low attenuating compatible  with fatty infiltration. Gallbladder and biliary tree are unremarkable. Pancreas: Unremarkable. No pancreatic ductal dilatation or surrounding inflammatory changes. Spleen: Normal in size without focal abnormality. Adrenals/Urinary Tract: The adrenal glands appear normal. Large parapelvic cyst in the left kidney and a small cyst in the upper pole of the right kidney are unchanged. Ureters and urinary bladder are unremarkable. The adrenal glands appear normal. Stomach/Bowel: Diverticulosis without diverticulitis is most extensive in the sigmoid colon. The stomach appears normal. The small bowel is unremarkable. The appendix appears normal. Vascular/Lymphatic: The patient has extensive atherosclerotic vascular disease. 3.4 cm abdominal aortic aneurysm is unchanged. 1.5 cm right and 1.4 cm left common iliac artery aneurysms are also unchanged. No hemorrhage or dissection is identified. No  lymphadenopathy. Reproductive: Prostatomegaly is seen. Other: No ascites or hernia. Musculoskeletal: No acute or focal abnormality. Mild remote superior endplate compression fracture of L1 and right hip replacement are noted. IMPRESSION: No acute abnormality abdomen and pelvis. Extensive atherosclerosis. No change in a 3.4 cm abdominal aortic aneurysm and bilateral common iliac artery aneurysms. Recommend followup by ultrasound in 3 years. This recommendation follows ACR consensus guidelines: White Paper of the ACR Incidental Findings Committee II on Vascular Findings. J Am Coll Radiol 2013; 10:789-794. Diverticulosis without diverticulitis. Prostatomegaly. Electronically Signed   By: Inge Rise M.D.   On: 09/13/2017 10:59    Assessment;  Patient is 84 year old Caucasian male with multiple medical problems on anticoagulant because of atrial fibrillation carotid artery disease as well as history of TIA who presents with complains of spitting up blood.  The symptoms started when he woke up this morning he also experienced some retrosternal discomfort.  Troponin levels are normal.  Abdominopelvic CT reveals no acute abnormality.  Patient denies heartburn nausea vomiting or dysphagia.  He also denies abdominal pain melena or rectal bleeding. It is difficult to be absolutely certain that he has upper GI bleed and not hemoptysis.  He does not have any GI symptoms to favor upper GI bleed.  He also does not have respiratory symptoms such as cough or shortness of breath.  Chest film is unremarkable. May end up examining his upper GI tract to make sure he is not experiencing upper GI bleed. He appears hemodynamically stable. He has mild anemia which appears to be chronic.  Hemoglobin back in December 2018 was 11.3 and now it is 11.8.  Recommendations;  CBC in a.m. Continue clear liquids. Patient will be reevaluated morning and will consider EGD unless his symptoms change.   LOS: 0 days   Saysha Menta   09/13/2017, 5:17 PM

## 2017-09-13 NOTE — ED Notes (Signed)
C/o pain to right hand, numbness rating pain 8/10.

## 2017-09-13 NOTE — ED Provider Notes (Signed)
[facility] EMERGENCY DEPARTMENT Provider Note   CSN: [id number] Arrival date & time: 09/13/17  4010     History   Chief Complaint Chief Complaint  Patient presents with  . Chest Pain    HPI CAINE BARFIELD is a 84 y.o. male.  Patient states that he has been spitting up blood since yesterday this is happened numerous times and he has filled up half a glass.  The history is provided by the patient. No language interpreter was used.  Illness  This is a new problem. The current episode started 12 to 24 hours ago. The problem occurs constantly. The problem has not changed since onset.Associated symptoms include abdominal pain. Pertinent negatives include no chest pain and no headaches. Nothing aggravates the symptoms. Nothing relieves the symptoms. He has tried nothing for the symptoms. The treatment provided no relief.    Past Medical History:  Diagnosis Date  . Anemia   . Arthritis   . BPH (benign prostatic hyperplasia)   . Carotid disease, bilateral ([place])    a. s/p L CEA in 2014. b. Duplex 05/2016: Right 40-59% ICA stenosis; Left 1-39% ICA stenosis  . CKD (chronic kidney disease), stage III ([place])   . COPD (chronic obstructive pulmonary disease) ([place])   . Diverticular disease 02/14/2011   problems swallowing  . DVT (deep venous thrombosis) ([place])   . Esophageal dysmotility   . Esophageal web   . GERD (gastroesophageal reflux disease)   . Headache(784.0)   . History of blood transfusion   . Hyperlipidemia   . Hypertension   . Hypothyroidism   . Kidney stone   . Nephrolithiasis    lithotripsy 2006  . Non-ischemic cardiomyopathy ([place])    a. Prior EF 45%, normal nuc 2012. b. EF 50-55% in 2018.  [name] PAF (paroxysmal atrial fibrillation) ([place])    a. identified on monitor 06/2016.  [name] Pneumonia    hx of - hospitalized  . Stroke ([place]) 05/2016  . TIA (transient ischemic attack)     Patient Active Problem List   Diagnosis Date Noted  . Hematemesis 09/13/2017  . Hypothyroidism  09/13/2017  . Acute blood loss anemia 09/13/2017  . Hematochezia   . Constipation   . Left-sided chest pain 05/18/2017  . PAF (paroxysmal atrial fibrillation) ([place]) 05/18/2017  . CKD (chronic kidney disease) stage 3, GFR 30-59 ml/min (HCC) 05/18/2017  . Thoracic aortic aneurysm without rupture ([place]) 05/18/2017  . Abdominal aortic aneurysm without rupture ([place]) 05/18/2017  . Renal artery stenosis ([place]) 05/18/2017  . Thrombocytopenia ([place]) 05/18/2017  . Carotid stenosis R 06/14/2016  . History of CEA (carotid endarterectomy)   . History of stroke   . Hyperlipidemia   . CVA (cerebral vascular accident) ([place]) - sm R cortical cryptogenic infarct 06/11/2016  . Aftercare following surgery of the circulatory system, [place] 10/11/2013  . CVA (cerebral infarction) 02/23/2013  . Occlusion and stenosis of carotid artery with cerebral infarction 02/23/2013  . TIA (transient ischemic attack) 02/22/2013  . Right arm weakness 02/22/2013  . Hypertension 02/22/2013  . Esophageal dysmotility 02/24/2011  . Esophageal web 02/24/2011  . Mouth pain 02/24/2011  . Acute renal failure ([place]) 02/16/2011  . Hypokalemia 02/13/2011  . Protein-calorie malnutrition, severe ([place]) 02/13/2011  . Dysphagia 02/10/2011  . Anemia 02/10/2011  . Weakness generalized 02/10/2011  . S/P hip replacement right 02/10/2011    Past Surgical History:  Procedure Laterality Date  . CAROTID ENDARTERECTOMY Left 03-11-13   cea  . carpel tunnel Right   .  ENDARTERECTOMY Left 03/11/2013   Procedure: ENDARTERECTOMY CAROTID with Vascu-Guard Bovine Patch.;  Surgeon: Conrad Pomona, MD;  Location: [facility];  Service: Vascular;  Laterality: Left;  . EP IMPLANTABLE DEVICE N/A 06/14/2016   Procedure: Loop Recorder Insertion;  Surgeon: Will Meredith Leeds, MD;  Location: [facility];  Service: Cardiovascular;  Laterality: N/A;  . JOINT REPLACEMENT Right    hip  . JOINT REPLACEMENT Left    knee  . KNEE ARTHROSCOPY Right 2010  . LOOP  RECORDER REMOVAL N/A 08/23/2016   Procedure: Loop Recorder Removal;  Surgeon: Will Meredith Leeds, MD;  Location: [facility];  Service: Cardiovascular;  Laterality: N/A;  . REPLACEMENT TOTAL KNEE Left 2006   left knee  . TOTAL HIP ARTHROPLASTY Right august 6th 2012   right side   . TRANSURETHRAL RESECTION OF PROSTATE  1996        Home Medications    Prior to Admission medications   Medication Sig Start Date End Date Taking? Authorizing Provider  acetaminophen (TYLENOL) 500 MG tablet Take 1,000 mg by mouth daily as needed for headache.    Yes [provider]  atorvastatin (LIPITOR) 20 MG tablet Take 1 tablet (20 mg total) by mouth daily. 09/12/17  Yes Strader, [place] M, PA-C  docusate sodium (COLACE) 100 MG capsule Take 100 mg by mouth at bedtime.   Yes [provider]  levothyroxine (SYNTHROID, LEVOTHROID) 88 MCG tablet Take 88 mcg by mouth daily.  05/29/16  Yes [provider]  losartan-hydrochlorothiazide (HYZAAR) 50-12.5 MG tablet Take 0.5 tablets by mouth daily. Takes 1/2 tablet daily 09/12/17  Yes Strader, [place] M, PA-C  Multiple Vitamins-Minerals (CENTRUM ADULTS PO) Take 1 tablet by mouth daily.   Yes [provider]  Nutritional Supplements (ENSURE COMPLETE PO) Take 237 mLs by mouth See admin instructions. Drink one can (237 mls) by mouth every other night   Yes [provider]  Psyllium (METAMUCIL PO) Take 2 scoop by mouth at bedtime. Mix in liquid and drink   Yes [provider]  Rivaroxaban (XARELTO) 15 MG TABS tablet Take 1 tablet (15 mg total) by mouth daily with supper. 09/21/16  Yes Lendon Colonel, NP  senna (SENOKOT) 8.6 MG TABS tablet Take 1 tablet by mouth at bedtime.   Yes [provider]  doxycycline (VIBRA-TABS) 100 MG tablet Take 100 mg by mouth 2 (two) times daily.    [provider]    Family History Family History  Problem Relation Age of Onset  . Stroke Father   . Heart  attack Father   . Hypertension Mother   . Hypertension Unknown   . Diabetes Unknown     Social History Social History   Tobacco Use  . Smoking status: Never Smoker  . Smokeless tobacco: Never Used  Substance Use Topics  . Alcohol use: No    Alcohol/week: 0.0 oz  . Drug use: No     Allergies   Sulfa antibiotics and Milk-related compounds   Review of Systems Review of Systems  Constitutional: Negative for appetite change and fatigue.  HENT: Negative for congestion, ear discharge and sinus pressure.   Eyes: Negative for discharge.  Respiratory: Negative for cough.   Cardiovascular: Negative for chest pain.  Gastrointestinal: Positive for abdominal pain. Negative for diarrhea.       Spitting up blood  Genitourinary: Negative for frequency and hematuria.  Musculoskeletal: Negative for back pain.  Skin: Negative for rash.  Neurological: Negative for seizures and  headaches.  Psychiatric/Behavioral: Negative for hallucinations.     Physical Exam Updated Vital Signs BP (!) 159/78   Pulse (!) 58   Temp 98 F (36.7 C) (Oral)   Resp 20   Ht 5\' 10"  (1.778 m)   Wt 60.3 kg (133 lb)   SpO2 99%   BMI 19.08 kg/m   Physical Exam  Constitutional: He is oriented to person, place, and time. He appears well-developed.  HENT:  Head: Normocephalic.  Eyes: Conjunctivae and EOM are normal. No scleral icterus.  Neck: Neck supple. No thyromegaly present.  Cardiovascular: Normal rate and regular rhythm. Exam reveals no gallop and no friction rub.  No murmur heard. Pulmonary/Chest: No stridor. He has no wheezes. He has no rales. He exhibits no tenderness.  Abdominal: He exhibits no distension. There is tenderness. There is no rebound.  Musculoskeletal: Normal range of motion. He exhibits no edema.  Lymphadenopathy:    He has no cervical adenopathy.  Neurological: He is oriented to person, place, and time. He exhibits normal muscle tone. Coordination normal.  Skin: No rash noted.  No erythema.  Psychiatric: He has a normal mood and affect. His behavior is normal.     ED Treatments / Results  Labs (all labs ordered are listed, but only abnormal results are displayed) Labs Reviewed  BASIC METABOLIC PANEL - Abnormal; Notable for the following components:      Result Value   GFR calc non Af Amer 57 (*)    All other components within normal limits  CBC - Abnormal; Notable for the following components:   RBC 4.00 (*)    Hemoglobin 11.8 (*)    HCT 36.7 (*)    Platelets 142 (*)    All other components within normal limits  DIFFERENTIAL  HEPATIC FUNCTION PANEL  TROPONIN I  TROPONIN I  TROPONIN I  HEMOGLOBIN AND HEMATOCRIT, BLOOD  I-STAT TROPONIN, ED    EKG EKG Interpretation  Date/Time:  Wednesday September 13 2017 08:21:25 EDT Ventricular Rate:  63 PR Interval:    QRS Duration: 108 QT Interval:  474 QTC Calculation: 486 R Axis:   -51 Text Interpretation:  Sinus rhythm Prolonged PR interval Incomplete left bundle branch block Anterior Q waves, possibly due to ILBBB Confirmed by [place] Ferguson [phone]) on 09/13/2017 9:27:57 AM   Radiology Dg Chest 2 View  Result Date: 09/13/2017 CLINICAL DATA:  Chest pain, shortness of breath. EXAM: CHEST - 2 VIEW COMPARISON:  Radiographs and CT scan of May 18, 2017. FINDINGS: The heart size and mediastinal contours are within normal limits. No pneumothorax or pleural effusion is noted. Atherosclerosis of thoracic aorta is noted. No acute pulmonary disease is noted. Stable right middle lobe scarring is noted. The visualized skeletal structures are unremarkable. IMPRESSION: No active cardiopulmonary disease. Aortic Atherosclerosis (ICD10-I70.0). Electronically Signed   By: Marijo Conception, M.D.   On: 09/13/2017 09:15   Ct Abdomen Pelvis W Contrast  Result Date: 09/13/2017 CLINICAL DATA:  Central chest pain on waking up this morning. The patient reports hemoptysis. EXAM: CT ABDOMEN AND PELVIS WITH CONTRAST TECHNIQUE:  Multidetector CT imaging of the abdomen and pelvis was performed using the standard protocol following bolus administration of intravenous contrast. CONTRAST:  100 ml ISOVUE-300 IOPAMIDOL (ISOVUE-300) INJECTION 61% COMPARISON:  CT angiogram chest, abdomen and pelvis 05/18/2017 and 02/17/2011. FINDINGS: Lower chest: Small focus of airspace opacity and bronchiectasis in the right middle lobe is unchanged since 2012 consistent with some remote infectious or inflammatory process. Lung bases are otherwise  clear. No pleural or pericardial effusion. Calcific aortic and coronary atherosclerosis noted. Hepatobiliary: Coarse calcification in the posterior right hepatic lobe is unchanged. The liver is somewhat low attenuating compatible with fatty infiltration. Gallbladder and biliary tree are unremarkable. Pancreas: Unremarkable. No pancreatic ductal dilatation or surrounding inflammatory changes. Spleen: Normal in size without focal abnormality. Adrenals/Urinary Tract: The adrenal glands appear normal. Large parapelvic cyst in the left kidney and a small cyst in the upper pole of the right kidney are unchanged. Ureters and urinary bladder are unremarkable. The adrenal glands appear normal. Stomach/Bowel: Diverticulosis without diverticulitis is most extensive in the sigmoid colon. The stomach appears normal. The small bowel is unremarkable. The appendix appears normal. Vascular/Lymphatic: The patient has extensive atherosclerotic vascular disease. 3.4 cm abdominal aortic aneurysm is unchanged. 1.5 cm right and 1.4 cm left common iliac artery aneurysms are also unchanged. No hemorrhage or dissection is identified. No lymphadenopathy. Reproductive: Prostatomegaly is seen. Other: No ascites or hernia. Musculoskeletal: No acute or focal abnormality. Mild remote superior endplate compression fracture of L1 and right hip replacement are noted. IMPRESSION: No acute abnormality abdomen and pelvis. Extensive atherosclerosis. No  change in a 3.4 cm abdominal aortic aneurysm and bilateral common iliac artery aneurysms. Recommend followup by ultrasound in 3 years. This recommendation follows ACR consensus guidelines: White Paper of the ACR Incidental Findings Committee II on Vascular Findings. J Am Coll Radiol 2013; 10:789-794. Diverticulosis without diverticulitis. Prostatomegaly. Electronically Signed   By: Inge Rise M.D.   On: 09/13/2017 10:59    Procedures Procedures (including critical care time)  Medications Ordered in ED Medications  atorvastatin (LIPITOR) tablet 20 mg (has no administration in time range)  docusate sodium (COLACE) capsule 100 mg (has no administration in time range)  levothyroxine (SYNTHROID, LEVOTHROID) tablet 88 mcg (has no administration in time range)  losartan-hydrochlorothiazide (HYZAAR) 50-12.5 MG per tablet 0.5 tablet (has no administration in time range)  feeding supplement (ENSURE COMPLETE) (ENSURE COMPLETE) liquid 237 mL (has no administration in time range)  psyllium (HYDROCIL/METAMUCIL) packet 1 packet (has no administration in time range)  senna (SENOKOT) tablet 8.6 mg (has no administration in time range)  sodium chloride flush (NS) 0.9 % injection 3 mL (has no administration in time range)  sodium chloride flush (NS) 0.9 % injection 3 mL (has no administration in time range)  0.9 %  sodium chloride infusion (has no administration in time range)  acetaminophen (TYLENOL) tablet 650 mg (has no administration in time range)    Or  acetaminophen (TYLENOL) suppository 650 mg (has no administration in time range)  traZODone (DESYREL) tablet 25 mg (has no administration in time range)  ondansetron (ZOFRAN) tablet 4 mg (has no administration in time range)    Or  ondansetron (ZOFRAN) injection 4 mg (has no administration in time range)  pantoprazole (PROTONIX) injection 40 mg (40 mg Intravenous Given 09/13/17 0841)  iopamidol (ISOVUE-300) 61 % injection 100 mL (100 mLs Intravenous  Contrast Given 09/13/17 1018)     Initial Impression / Assessment and Plan / ED Course  I have reviewed the triage vital signs and the nursing notes.  Pertinent labs & imaging results that were available during my care of the patient were reviewed by me and considered in my medical decision making (see chart for details).     Patient with abdominal discomfort vomiting blood.  Patient is also on blood thinners.  He is hemodynamically stable.  He will be admitted to medicine with possible GI consult for endoscopy  Final  Clinical Impressions(s) / ED Diagnoses   Final diagnoses:  Hematemesis without nausea    ED Discharge Orders    None       [place] Ferguson, MD 09/13/17 1302

## 2017-09-13 NOTE — H&P (Addendum)
History and Physical  [name] JTT:[id number] DOB: 03/14/1925 DOA: 09/13/2017  Referring physician: Dr. Roderic Palau PCP: Celene Squibb, MD   Chief Complaint: Admission  HPI: Jorge Nixon is a 84 y.o. male with a history significant for CVA and TIA, pneumonia, atrial fibrillation, abdominal aortic aneurism, nephrolithiasis, hypothyroidism, hypertension, hyperlipidemia, GERD, esophageal dysmotility, DVT, diverticular disease, COPD, chronic kidney disease, bilateral carotid artery disease, and BPH. He takes Xarelto 15 mg daily by mouth for management of his a-fib. He presented in the ED from home with family complaining of hematemesis several times through the night that started about 12 hours ago. He states this started last night along with epigastric pain. He states the pain is worse mid abdomen. He states that the hemoptysis occurs randomly and nothing makes symptom worse or better. He states his abdominal pain started about 12 hours ago along with his hematemesis. He states that not moving makes the pain better and that pressing on his abdomen makes it worse. [name] also states he has not tried anything for his symptoms. He denies chest pain, fever, chills, nausea, vomiting, diarrhea, black, stools, cough or headache.  In the ED his initial vital signs were BP 159/78, pulse 58, temp 98 degrees F oral, respirations 20, SpO1 99%. Lab testing showed a calcium of 0.6, and GFR 57.  CBC showed RBS, 4.00, Hgb 11.8, HCT 36.7, and platelets of 142 which are baseline results for patient. CT of abdomin showed no acute abnormality with presence of of abdominal aortic aneurysm and common iliac aneurysms that show no changes and diverticulosis without diverticulitis. Due to comorbidites patient was referred for admission.  Review of Systems: All systems reviewed and apart from history of presenting illness, are negative.  Past Medical History:  Diagnosis Date  . Anemia   . Arthritis   . BPH (benign  prostatic hyperplasia)   . Carotid disease, bilateral ([place])    a. s/p L CEA in 2014. b. Duplex 05/2016: Right 40-59% ICA stenosis; Left 1-39% ICA stenosis  . CKD (chronic kidney disease), stage III ([place])   . COPD (chronic obstructive pulmonary disease) ([place])   . Diverticular disease 02/14/2011   problems swallowing  . DVT (deep venous thrombosis) ([place])   . Esophageal dysmotility   . Esophageal web   . GERD (gastroesophageal reflux disease)   . Headache(784.0)   . History of blood transfusion   . Hyperlipidemia   . Hypertension   . Hypothyroidism   . Kidney stone   . Nephrolithiasis    lithotripsy 2006  . Non-ischemic cardiomyopathy ([place])    a. Prior EF 45%, normal nuc 2012. b. EF 50-55% in 2018.  [name] PAF (paroxysmal atrial fibrillation) ([place])    a. identified on monitor 06/2016.  [name] Pneumonia    hx of - hospitalized  . Stroke ([place]) 05/2016  . TIA (transient ischemic attack)    Past Surgical History:  Procedure Laterality Date  . CAROTID ENDARTERECTOMY Left 03-11-13   cea  . carpel tunnel Right   . ENDARTERECTOMY Left 03/11/2013   Procedure: ENDARTERECTOMY CAROTID with Vascu-Guard Bovine Patch.;  Surgeon: Conrad Victor, MD;  Location: [facility];  Service: Vascular;  Laterality: Left;  . EP IMPLANTABLE DEVICE N/A 06/14/2016   Procedure: Loop Recorder Insertion;  Surgeon: Will Meredith Leeds, MD;  Location: [facility];  Service: Cardiovascular;  Laterality: N/A;  . JOINT REPLACEMENT Right    hip  . JOINT REPLACEMENT Left    knee  .  KNEE ARTHROSCOPY Right 2010  . LOOP RECORDER REMOVAL N/A 08/23/2016   Procedure: Loop Recorder Removal;  Surgeon: Will Meredith Leeds, MD;  Location: [facility];  Service: Cardiovascular;  Laterality: N/A;  . REPLACEMENT TOTAL KNEE Left 2006   left knee  . TOTAL HIP ARTHROPLASTY Right august 6th 2012   right side   . TRANSURETHRAL RESECTION OF PROSTATE  1996   Social History:  reports that he has never smoked. He has never used smokeless  tobacco. He reports that he does not drink alcohol or use drugs.  Allergies  Allergen Reactions  . Sulfa Antibiotics Rash  . Milk-Related Compounds Other (See Comments)    Upset stomach    Family History  Problem Relation Age of Onset  . Stroke Father   . Heart attack Father   . Hypertension Mother   . Hypertension Unknown   . Diabetes Unknown     Prior to Admission medications   Medication Sig Start Date End Date Taking? Authorizing Provider  acetaminophen (TYLENOL) 500 MG tablet Take 1,000 mg by mouth daily as needed for headache.    Yes [provider]  atorvastatin (LIPITOR) 20 MG tablet Take 1 tablet (20 mg total) by mouth daily. 09/12/17  Yes Strader, [place] M, PA-C  docusate sodium (COLACE) 100 MG capsule Take 100 mg by mouth at bedtime.   Yes [provider]  levothyroxine (SYNTHROID, LEVOTHROID) 88 MCG tablet Take 88 mcg by mouth daily.  05/29/16  Yes [provider]  losartan-hydrochlorothiazide (HYZAAR) 50-12.5 MG tablet Take 0.5 tablets by mouth daily. Takes 1/2 tablet daily 09/12/17  Yes Strader, [place] M, PA-C  Multiple Vitamins-Minerals (CENTRUM ADULTS PO) Take 1 tablet by mouth daily.   Yes [provider]  Nutritional Supplements (ENSURE COMPLETE PO) Take 237 mLs by mouth See admin instructions. Drink one can (237 mls) by mouth every other night   Yes [provider]  Psyllium (METAMUCIL PO) Take 2 scoop by mouth at bedtime. Mix in liquid and drink   Yes [provider]  Rivaroxaban (XARELTO) 15 MG TABS tablet Take 1 tablet (15 mg total) by mouth daily with supper. 09/21/16  Yes Lendon Colonel, NP  senna (SENOKOT) 8.6 MG TABS tablet Take 1 tablet by mouth at bedtime.   Yes [provider]  doxycycline (VIBRA-TABS) 100 MG tablet Take 100 mg by mouth 2 (two) times daily.    [provider]   Physical Exam: Vitals:   09/13/17 1137 09/13/17 1200 09/13/17 1230 09/13/17 1330  BP: (!) 171/77 (!)  152/82 (!) 159/78 (!) 154/73  Pulse: 61 77 (!) 58 (!) 58  Resp: 15 20 20 20   Temp:      TempSrc:      SpO2: 100% 99% 99% 98%  Weight:      Height:         General exam: Moderately built and nourished patient, lying comfortably supine on the gurney in no obvious distress.  Head, eyes and ENT: Nontraumatic and normocephalic. Pupils equally reacting to light and accommodation. Oral mucosa moist.  Neck: Supple. No JVD, carotid bruit or thyromegaly.  Lymphatics: No lymphadenopathy.  Respiratory system: Clear to auscultation. No increased work of breathing.  Cardiovascular system: S1 and S2 heard, RRR. No JVD, murmurs, gallops, clicks or pedal edema.  Gastrointestinal system: Abdomen is nondistended, soft and nontender. Normal bowel sounds heard. No organomegaly or masses appreciated.  Central nervous system: Alert and oriented. No focal neurological deficits.  Extremities: Symmetric 5  x 5 power. Peripheral pulses symmetrically felt.   Skin: No rashes or acute findings.  Musculoskeletal system: Negative exam.  Psychiatry: Pleasant and cooperative.  Labs on Admission:  Basic Metabolic Panel: Recent Labs  Lab 09/13/17 0837  NA 139  K 4.1  CL 104  CO2 25  GLUCOSE 91  BUN 17  CREATININE 1.10  CALCIUM 9.2   Liver Function Tests: Recent Labs  Lab 09/13/17 0837  AST 37  ALT 17  ALKPHOS 69  BILITOT 0.8  PROT 7.1  ALBUMIN 4.1   No results for input(s): LIPASE, AMYLASE in the last 168 hours. No results for input(s): AMMONIA in the last 168 hours. CBC: Recent Labs  Lab 09/13/17 0837  WBC 5.1  NEUTROABS 3.0  HGB 11.8*  HCT 36.7*  MCV 91.8  PLT 142*   Cardiac Enzymes: Recent Labs  Lab 09/13/17 1305  TROPONINI <0.03    BNP (last 3 results) No results for input(s): PROBNP in the last 8760 hours. CBG: No results for input(s): GLUCAP in the last 168 hours.  Radiological Exams on Admission: Dg Chest 2 View  Result Date: 09/13/2017 CLINICAL DATA:  Chest  pain, shortness of breath. EXAM: CHEST - 2 VIEW COMPARISON:  Radiographs and CT scan of May 18, 2017. FINDINGS: The heart size and mediastinal contours are within normal limits. No pneumothorax or pleural effusion is noted. Atherosclerosis of thoracic aorta is noted. No acute pulmonary disease is noted. Stable right middle lobe scarring is noted. The visualized skeletal structures are unremarkable. IMPRESSION: No active cardiopulmonary disease. Aortic Atherosclerosis (ICD10-I70.0). Electronically Signed   By: Marijo Conception, M.D.   On: 09/13/2017 09:15   Ct Abdomen Pelvis W Contrast  Result Date: 09/13/2017 CLINICAL DATA:  Central chest pain on waking up this morning. The patient reports hemoptysis. EXAM: CT ABDOMEN AND PELVIS WITH CONTRAST TECHNIQUE: Multidetector CT imaging of the abdomen and pelvis was performed using the standard protocol following bolus administration of intravenous contrast. CONTRAST:  100 ml ISOVUE-300 IOPAMIDOL (ISOVUE-300) INJECTION 61% COMPARISON:  CT angiogram chest, abdomen and pelvis 05/18/2017 and 02/17/2011. FINDINGS: Lower chest: Small focus of airspace opacity and bronchiectasis in the right middle lobe is unchanged since 2012 consistent with some remote infectious or inflammatory process. Lung bases are otherwise clear. No pleural or pericardial effusion. Calcific aortic and coronary atherosclerosis noted. Hepatobiliary: Coarse calcification in the posterior right hepatic lobe is unchanged. The liver is somewhat low attenuating compatible with fatty infiltration. Gallbladder and biliary tree are unremarkable. Pancreas: Unremarkable. No pancreatic ductal dilatation or surrounding inflammatory changes. Spleen: Normal in size without focal abnormality. Adrenals/Urinary Tract: The adrenal glands appear normal. Large parapelvic cyst in the left kidney and a small cyst in the upper pole of the right kidney are unchanged. Ureters and urinary bladder are unremarkable. The adrenal  glands appear normal. Stomach/Bowel: Diverticulosis without diverticulitis is most extensive in the sigmoid colon. The stomach appears normal. The small bowel is unremarkable. The appendix appears normal. Vascular/Lymphatic: The patient has extensive atherosclerotic vascular disease. 3.4 cm abdominal aortic aneurysm is unchanged. 1.5 cm right and 1.4 cm left common iliac artery aneurysms are also unchanged. No hemorrhage or dissection is identified. No lymphadenopathy. Reproductive: Prostatomegaly is seen. Other: No ascites or hernia. Musculoskeletal: No acute or focal abnormality. Mild remote superior endplate compression fracture of L1 and right hip replacement are noted. IMPRESSION: No acute abnormality abdomen and pelvis. Extensive atherosclerosis. No change in a 3.4 cm abdominal aortic aneurysm and bilateral common iliac artery aneurysms. Recommend  followup by ultrasound in 3 years. This recommendation follows ACR consensus guidelines: White Paper of the ACR Incidental Findings Committee II on Vascular Findings. J Am Coll Radiol 2013; 10:789-794. Diverticulosis without diverticulitis. Prostatomegaly. Electronically Signed   By: Inge Rise M.D.   On: 09/13/2017 10:59    EKG: Independently reviewed.   Assessment/Plan Principal Problem:   Hematemesis Active Problems:   Weakness generalized   Protein-calorie malnutrition, severe (HCC)   Hypertension   History of CEA (carotid endarterectomy)   History of stroke   Hyperlipidemia   Left-sided chest pain   PAF (paroxysmal atrial fibrillation) (HCC)   Renal artery stenosis (HCC)   Thrombocytopenia (HCC)   Hypothyroidism   Acute blood loss anemia   1. Hematemesis - Consult with GI. Will hold Xeralto until cleare by GI. Start on clear liquid diet. 2. Weakness generalized - Physical therapy evaluation after cleared by GI. 3. Paroxysmal atrial fibrillation - Hold Xeralto, may restart after consult with GI. Currently under  control. 4. Protein-Calorie malnutrition - Start on clear liquid diet pending consult with GI. Dietitian consult.  5. Hypertension - Controlled with oral medications. Will continue with home meds while inpatient. 6. History of stroke - In June 14, 2016 with subsequent CVA was placed on Xeralto. Will continue Xeralto pending GI consult. 7. Chronic Kidney disease stage III - Creatinine at base line 1.10, GFR 57. 8. Hyperlipidemia - Controlled with oral medication. Will continue home meds while inpatient.  9. Left-sided chest pain - Initial troponin and chest xray negative. EKG shows A-fib that is rate controled. Patient takes Nutritional therapist. May restart after consult with GI. 10. Thrombocytopenia - Chronic issue; patient is currently at baseline. Monitor CBC. 11. Hypothyroidism - Controlled with oral medication. Will continue with home meds while inpatient. 12. Acute blood loss anemia - Hold Xeralto. Recheck CBC in morning.  DVT Prophylaxis: SCD Code Status: FULL CODE  Family Communication: Wife and daughter at bedside during patient interview.  Disposition Plan: Consult with GI Dr. Laural Golden.    Time spent: 78 minutes  Ashok Norris, Student AGACNP  Attending: Pt was seen and examined with Gaston Islam and I agree with the note as documented above and participated in the A/P and management as documented above.    Irwin Brakeman MD [facility] Pager [phone]  If 7PM-7AM, please contact night-coverage [email or web address] Password [email or web address] 09/13/2017, 1:52 PM

## 2017-09-14 ENCOUNTER — Encounter (HOSPITAL_COMMUNITY)
Admission: EM | Disposition: A | Discharge status: Home / Self Care | Payer: Self-pay | Source: Home / Self Care | Attending: Family Medicine

## 2017-09-14 ENCOUNTER — Encounter (HOSPITAL_COMMUNITY): Payer: Self-pay | Admitting: *Deleted

## 2017-09-14 ENCOUNTER — Inpatient Hospital Stay (HOSPITAL_COMMUNITY): Payer: Medicare Other

## 2017-09-14 DIAGNOSIS — Z7901 Long term (current) use of anticoagulants: Secondary | ICD-10-CM | POA: Diagnosis not present

## 2017-09-14 DIAGNOSIS — R1013 Epigastric pain: Secondary | ICD-10-CM | POA: Diagnosis not present

## 2017-09-14 DIAGNOSIS — Z9889 Other specified postprocedural states: Secondary | ICD-10-CM | POA: Diagnosis not present

## 2017-09-14 DIAGNOSIS — R079 Chest pain, unspecified: Secondary | ICD-10-CM | POA: Diagnosis not present

## 2017-09-14 DIAGNOSIS — D62 Acute posthemorrhagic anemia: Secondary | ICD-10-CM | POA: Diagnosis not present

## 2017-09-14 DIAGNOSIS — K920 Hematemesis: Secondary | ICD-10-CM | POA: Diagnosis not present

## 2017-09-14 DIAGNOSIS — Z8673 Personal history of transient ischemic attack (TIA), and cerebral infarction without residual deficits: Secondary | ICD-10-CM | POA: Diagnosis not present

## 2017-09-14 DIAGNOSIS — K228 Other specified diseases of esophagus: Secondary | ICD-10-CM

## 2017-09-14 HISTORY — PX: ESOPHAGOGASTRODUODENOSCOPY: SHX5428

## 2017-09-14 LAB — MAGNESIUM: Magnesium: 2 mg/dL (ref 1.7–2.4)

## 2017-09-14 LAB — COMPREHENSIVE METABOLIC PANEL
ALT: 16 U/L — ABNORMAL LOW (ref 17–63)
ANION GAP: 12 (ref 5–15)
AST: 32 U/L (ref 15–41)
Albumin: 3.7 g/dL (ref 3.5–5.0)
Alkaline Phosphatase: 61 U/L (ref 38–126)
BUN: 15 mg/dL (ref 6–20)
CHLORIDE: 102 mmol/L (ref 101–111)
CO2: 26 mmol/L (ref 22–32)
Calcium: 8.9 mg/dL (ref 8.9–10.3)
Creatinine, Ser: 1.06 mg/dL (ref 0.61–1.24)
GFR, EST NON AFRICAN AMERICAN: 60 mL/min — AB (ref >60)
Glucose, Bld: 91 mg/dL (ref 65–99)
POTASSIUM: 3.3 mmol/L — AB (ref 3.5–5.1)
Sodium: 140 mmol/L (ref 135–145)
Total Bilirubin: 1 mg/dL (ref 0.3–1.2)
Total Protein: 6.5 g/dL (ref 6.5–8.1)

## 2017-09-14 LAB — CBC
HEMATOCRIT: 35.6 % — AB (ref 39.0–52.0)
HEMOGLOBIN: 11.5 g/dL — AB (ref 13.0–17.0)
MCH: 29.3 pg (ref 26.0–34.0)
MCHC: 32.3 g/dL (ref 30.0–36.0)
MCV: 90.8 fL (ref 78.0–100.0)
PLATELETS: 133 10*3/uL — AB (ref 150–400)
RBC: 3.92 MIL/uL — AB (ref 4.22–5.81)
RDW: 14.5 % (ref 11.5–15.5)
WBC: 5.3 10*3/uL (ref 4.0–10.5)

## 2017-09-14 LAB — TROPONIN I: Troponin I: <0.03 ng/mL (ref <0.03)

## 2017-09-14 SURGERY — EGD (ESOPHAGOGASTRODUODENOSCOPY)
Anesthesia: Moderate Sedation

## 2017-09-14 MED ORDER — MIDAZOLAM HCL 5 MG/5ML IJ SOLN
INTRAMUSCULAR | Status: AC
  Filled 2017-09-14: qty 10

## 2017-09-14 MED ORDER — SODIUM CHLORIDE 0.9 % IV SOLN
INTRAVENOUS | Status: DC
Start: 2017-09-14 — End: 2017-09-14
  Administered 2017-09-14: 13:00:00 via INTRAVENOUS

## 2017-09-14 MED ORDER — LIDOCAINE VISCOUS 2 % MT SOLN
OROMUCOSAL | Status: AC
  Filled 2017-09-14: qty 15

## 2017-09-14 MED ORDER — STERILE WATER FOR IRRIGATION IR SOLN
Status: DC | PRN
  Administered 2017-09-14: 100 mL

## 2017-09-14 MED ORDER — LIDOCAINE VISCOUS 2 % MT SOLN
OROMUCOSAL | Status: DC | PRN
  Administered 2017-09-14: 4 mL via OROMUCOSAL

## 2017-09-14 MED ORDER — MEPERIDINE HCL 50 MG/ML IJ SOLN
INTRAMUSCULAR | Status: AC
  Filled 2017-09-14: qty 1

## 2017-09-14 MED ORDER — MIDAZOLAM HCL 5 MG/5ML IJ SOLN
INTRAMUSCULAR | Status: DC | PRN
  Administered 2017-09-14 (×2): 1 mg via INTRAVENOUS

## 2017-09-14 MED ORDER — POTASSIUM CHLORIDE 20 MEQ/15ML (10%) PO SOLN
60.0000 meq | Freq: Once | ORAL | Status: AC
  Administered 2017-09-14: 60 meq via ORAL
  Filled 2017-09-14: qty 60

## 2017-09-14 MED ORDER — MEPERIDINE HCL 50 MG/ML IJ SOLN
INTRAMUSCULAR | Status: DC | PRN
  Administered 2017-09-14: 25 mg via INTRAVENOUS

## 2017-09-14 NOTE — Discharge Instructions (Signed)
No bleeding was found coming from your stomach or esophagus but there was some bleeding from around your vocal cords found.  You need to follow up with an Ear nose and throat to be evaluated. Please call and make an appointment to be seen early next week.    Please call Dr. Benjamine Mola office in the morning and make an appointment to be seen by early next week.    Please hold blood thinner until you have been seen by ENT.   Please call Dr. Nevada Crane office in the morning and go to have your hemoglobin checked.    Seek medical care or return if symptoms come back or new problems develop.   Follow with Primary MD  Celene Squibb, MD  and other consultant's as instructed your Hospitalist MD  Please get a complete blood count and chemistry panel checked by your Primary MD at your next visit, and again as instructed by your Primary MD.  Get Medicines reviewed and adjusted: Please take all your medications with you for your next visit with your Primary MD  Laboratory/radiological data: Please request your Primary MD to go over all hospital tests and procedure/radiological results at the follow up, please ask your Primary MD to get all Hospital records sent to his/her office.  In some cases, they will be blood work, cultures and biopsy results pending at the time of your discharge. Please request that your primary care M.D. follows up on these results.  Also Note the following: If you experience worsening of your admission symptoms, develop shortness of breath, life threatening emergency, suicidal or homicidal thoughts you must seek medical attention immediately by calling 911 or calling your MD immediately  if symptoms less severe.  You must read complete instructions/literature along with all the possible adverse reactions/side effects for all the Medicines you take and that have been prescribed to you. Take any new Medicines after you have completely understood and accpet all the possible adverse  reactions/side effects.   Do not drive when taking Pain medications or sleeping medications (Benzodaizepines)  Do not take more than prescribed Pain, Sleep and Anxiety Medications. It is not advisable to combine anxiety,sleep and pain medications without talking with your primary care practitioner  Special Instructions: If you have smoked or chewed Tobacco  in the last 2 yrs please stop smoking, stop any regular Alcohol  and or any Recreational drug use.  Wear Seat belts while driving.  Please note: You were cared for by a hospitalist during your hospital stay. Once you are discharged, your primary care physician will handle any further medical issues. Please note that NO REFILLS for any discharge medications will be authorized once you are discharged, as it is imperative that you return to your primary care physician (or establish a relationship with a primary care physician if you do not have one) for your post hospital discharge needs so that they can reassess your need for medications and monitor your lab values.

## 2017-09-14 NOTE — Progress Notes (Signed)
09/14/2017 5:49 PM  I spoke with patient and wife.  They are upset and want to discharge home now and do not want to wait til tomorrow to have hemoglobin rechecked.  I had already called Jevin earlier and spoke to him over the phone and explained the results of tests and plan to him personally.  I explained to them that Jojo needed to go to ENT to have follow up appointment and to hold Xarelto until he can follow up with ENT.  They verbalized understanding but they want to go home tonight.    Murvin Natal MD

## 2017-09-14 NOTE — Progress Notes (Signed)
Brief EGD note.  Normal EGD. Blood/clot noted along the left side of laryngeal element and also covering the left vocal cord. No blood noted in trachea.

## 2017-09-14 NOTE — Progress Notes (Signed)
Patient is to be discharged home and in stable condition. Patient's IV removed, WNL. Patient given discharge instructions and verbalized understanding. Patient will be escorted out by staff via wheelchair upon arrival of transportation.  Dasani Crear P Dishmon, RN  

## 2017-09-14 NOTE — Discharge Summary (Addendum)
Physician Discharge Summary  [name] QBH:[id number] DOB: Sep 15, 1924 DOA: 09/13/2017  PCP: Celene Squibb, MD  Admit date: 09/13/2017 Discharge date: 09/14/2017  Admitted From: Home  Disposition: Home  Recommendations for Outpatient Follow-up:  1. Follow up with PCP tomorrow to have hemoglobin checked. 2. Follow up with ENT Dr. Benjamine Mola in 5 days to be evaluated 3. Please obtain CBC tomorrow with PCP. 4. Please hold Xarelto until pt seen by ENT and cleared to restart.   Home Health: PT   Discharge Condition: STABLE   CODE STATUS: FULL    Brief Hospitalization Summary: Please see all hospital notes, images, labs for full details of the hospitalization. HPI: Jorge Nixon is a 84 y.o. male with a history significant for CVA and TIA, pneumonia, atrial fibrillation, abdominal aortic aneurism, nephrolithiasis, hypothyroidism, hypertension, hyperlipidemia, GERD, esophageal dysmotility, DVT, diverticular disease, COPD, chronic kidney disease, bilateral carotid artery disease, and BPH. He takes Xarelto 15 mg daily by mouth for management of his a-fib. He presented in the ED from home with family complaining of hematemesis several times through the night that started about 12 hours ago. He states this started last night along with epigastric pain. He states the pain is worse mid abdomen. He states that the hemoptysis occurs randomly and nothing makes symptom worse or better. He states his abdominal pain started about 12 hours ago along with his hematemesis. He states that not moving makes the pain better and that pressing on his abdomen makes it worse. Mr. Schaffert also states he has not tried anything for his symptoms. He denies chest pain, fever, chills, nausea, vomiting, diarrhea, black, stools, cough or headache.  In the ED his initial vital signs were BP 159/78, pulse 58, temp 98 degrees F oral, respirations 20, SpO1 99%. Lab testing showed a calcium of 0.6, and GFR 57.  CBC showed RBS, 4.00, Hgb 11.8, HCT  36.7, and platelets of 142 which are baseline results for patient. CT of abdomin showed no acute abnormality with presence of of abdominal aortic aneurysm and common iliac aneurysms that show no changes and diverticulosis without diverticulitis. Due to comorbidites patient was referred for admission.  Brief Admission Hx: Jorge Nixon a 84 y.o.malewith a history significant for CVA and TIA, pneumonia, atrial fibrillation, abdominal aortic aneurism, nephrolithiasis, hypothyroidism, hypertension, hyperlipidemia, GERD, esophageal dysmotility, DVT, diverticular disease, COPD, chronic kidney disease, bilateral carotid artery disease, and BPH. He takes Xarelto 15 mg daily by mouth for management of his a-fib. He presented in the ED from home with family complaining of hematemesis several times through the night that started about 12 hours ago.  MDM/Assessment & Plan:    1. Hematemesis - Pt was found to be bleeding from around the larynx but no GI bleeding source was found.  Xarelto is being held until he can follow up with ENT.  Pt declined to stay in  Hospital another night and  Insisted on being discharged.  He will need to call and schedule an appt with ENT.  I spoke to his wife who also wanted him discharged and she will make arrangements for him to go to ENT.   Consulted with GI. Will hold Xeralto.  EGD revealed bleeding source is from larynx.  Will have patient follow up with ENT Dr. Benjamine Mola in 4-5 days for recheck.  Recheck H/H with PCP in 1-2 days. .  I sent a message to Dr. Benjamine Mola in Epic EMR with clinicals and requesting that he see patient next week.  2. Weakness generalized - Physical therapy evaluation recommending [facility] PT. care management will make arrangements for these services.   3. Paroxysmal atrial fibrillation - Holding [place], to restart after consult with  ENT.  Rate controlled. 4. Protein-Calorie malnutrition - heart healthy diet,Dietitian consulted. 5. Hypertension -  Controlled with oral medications. Will continue with home meds while inpatient. 6. History of stroke - In June 14, 2016 with subsequentCVAwas placed on Xeralto.  Holding Xarelto until he has seen ENT.  7. Chronic Kidney disease stage III - Creatinine at base line 1.10, GFR 57. 8. Hyperlipidemia - Controlled with oral medication. Will continue home meds while inpatient.  9. Left-sided chest pain -  troponin negative x 3 and chest xray negative. EKG shows A-fib that is rate controlled. Patient takes Nutritional therapist. May restart after consult with GI. 10. Thrombocytopenia - Chronic issue; patient is currently at baseline. Monitor CBC. 11. Hypothyroidism - Controlled with oral medication. Will continue with home meds while inpatient. 12. Acute blood loss anemia - Holding Xeralto. Recheck CBC in morning. 13. RUE paresthesias - CT to rule out acute CVA was negative for acute findings. Suspect this is radiculopathy from sleeping on right shoulder.  Follow up with PCP for recheck.    DVT Prophylaxis:SCD Code Status:FULL CODE Family Communication:Wife and daughter at bedside during patient interview. Disposition Plan:Consult with GI Dr. Laural Golden.  Consultants:  GI  Procedures:  EGD 4/18    Discharge Diagnoses:  Principal Problem:   Hematemesis Active Problems:   Weakness generalized   Protein-calorie malnutrition, severe (HCC)   Hypertension   History of CEA (carotid endarterectomy)   History of stroke   Hyperlipidemia   Left-sided chest pain   PAF (paroxysmal atrial fibrillation) (HCC)   AAA (abdominal aortic aneurysm) without rupture (HCC)   Renal artery stenosis (HCC)   Thrombocytopenia (HCC)   Hypothyroidism   Acute blood loss anemia   Abdominal pain, epigastric    Discharge Instructions: Discharge Instructions    Call MD for:  difficulty breathing, headache or visual disturbances   Complete by:  As directed    Call MD for:  extreme fatigue   Complete by:  As  directed    Call MD for:  persistant dizziness or light-headedness   Complete by:  As directed    Call MD for:  severe uncontrolled pain   Complete by:  As directed    Increase activity slowly   Complete by:  As directed      Allergies as of 09/14/2017      Reactions   Sulfa Antibiotics Rash   Milk-related Compounds Other (See Comments)   Upset stomach      Medication List    STOP taking these medications   doxycycline 100 MG tablet Commonly known as:  VIBRA-TABS   Rivaroxaban 15 MG Tabs tablet Commonly known as:  XARELTO     TAKE these medications   acetaminophen 500 MG tablet Commonly known as:  TYLENOL Take 1,000 mg by mouth daily as needed for headache.   atorvastatin 20 MG tablet Commonly known as:  LIPITOR Take 1 tablet (20 mg total) by mouth daily.   CENTRUM ADULTS PO Take 1 tablet by mouth daily.   docusate sodium 100 MG capsule Commonly known as:  COLACE Take 100 mg by mouth at bedtime.   ENSURE COMPLETE PO Take 237 mLs by mouth See admin instructions. Drink one can (237 mls) by mouth every other night   levothyroxine 88 MCG tablet Commonly known as:  SYNTHROID, LEVOTHROID Take 88 mcg by mouth daily.   losartan-hydrochlorothiazide 50-12.5 MG tablet Commonly known as:  HYZAAR Take 0.5 tablets by mouth daily. Takes 1/2 tablet daily   METAMUCIL PO Take 2 scoop by mouth at bedtime. Mix in liquid and drink   senna 8.6 MG Tabs tablet Commonly known as:  SENOKOT Take 1 tablet by mouth at bedtime.      Follow-up Information    Leta Baptist, MD. Schedule an appointment as soon as possible for a visit in 5 day(s).   Specialty:  Otolaryngology Contact information: [address] [place] [address] [phone]        Celene Squibb, MD. Schedule an appointment as soon as possible for a visit in 1 day(s).   Specialty:  Internal Medicine Why:  recheck hemoglobin, hospital follow up Contact information: [facility]  [id number] [phone]          Allergies  Allergen Reactions  . Sulfa Antibiotics Rash  . Milk-Related Compounds Other (See Comments)    Upset stomach   Allergies as of 09/14/2017      Reactions   Sulfa Antibiotics Rash   Milk-related Compounds Other (See Comments)   Upset stomach      Medication List    STOP taking these medications   doxycycline 100 MG tablet Commonly known as:  VIBRA-TABS   Rivaroxaban 15 MG Tabs tablet Commonly known as:  XARELTO     TAKE these medications   acetaminophen 500 MG tablet Commonly known as:  TYLENOL Take 1,000 mg by mouth daily as needed for headache.   atorvastatin 20 MG tablet Commonly known as:  LIPITOR Take 1 tablet (20 mg total) by mouth daily.   CENTRUM ADULTS PO Take 1 tablet by mouth daily.   docusate sodium 100 MG capsule Commonly known as:  COLACE Take 100 mg by mouth at bedtime.   ENSURE COMPLETE PO Take 237 mLs by mouth See admin instructions. Drink one can (237 mls) by mouth every other night   levothyroxine 88 MCG tablet Commonly known as:  SYNTHROID, LEVOTHROID Take 88 mcg by mouth daily.   losartan-hydrochlorothiazide 50-12.5 MG tablet Commonly known as:  HYZAAR Take 0.5 tablets by mouth daily. Takes 1/2 tablet daily   METAMUCIL PO Take 2 scoop by mouth at bedtime. Mix in liquid and drink   senna 8.6 MG Tabs tablet Commonly known as:  SENOKOT Take 1 tablet by mouth at bedtime.       Procedures/Studies: Dg Chest 2 View  Result Date: 09/13/2017 CLINICAL DATA:  Chest pain, shortness of breath. EXAM: CHEST - 2 VIEW COMPARISON:  Radiographs and CT scan of May 18, 2017. FINDINGS: The heart size and mediastinal contours are within normal limits. No pneumothorax or pleural effusion is noted. Atherosclerosis of thoracic aorta is noted. No acute pulmonary disease is noted. Stable right middle lobe scarring is noted. The visualized skeletal structures are unremarkable. IMPRESSION: No active cardiopulmonary  disease. Aortic Atherosclerosis (ICD10-I70.0). Electronically Signed   By: Marijo Conception, M.D.   On: 09/13/2017 09:15   Ct Head Wo Contrast  Result Date: 09/14/2017 CLINICAL DATA:  84 year old male with headache. Right upper extremity numbness. EXAM: CT HEAD WITHOUT CONTRAST TECHNIQUE: Contiguous axial images were obtained from the base of the skull through the vertex without intravenous contrast. COMPARISON:  Head CT without contrast and brain MRI 06/12/2016, and earlier. FINDINGS: Brain: The small right peri rolandic cortical infarct seen in January demonstrates  subtle encephalomalacia (series 2, image 24). Gray-white matter differentiation elsewhere in the hemispheres appears stable and normal for age. A small chronic left cerebellar lacune was better demonstrated by MRI. No midline shift, ventriculomegaly, mass effect, evidence of mass lesion, intracranial hemorrhage or evidence of cortically based acute infarction. Vascular: Calcified atherosclerosis at the skull base. No suspicious intracranial vascular hyperdensity. Skull: No acute osseous abnormality identified. Sinuses/Orbits: Clear. Other: Visualized orbit soft tissues are within normal limits. Scalp soft tissues appear within normal limits today. IMPRESSION: 1. No acute intracranial abnormality. 2. Expected evolution of the small right peri rolandic cortical infarct in January, with subtle encephalomalacia by CT. Electronically Signed   By: Genevie Ann M.D.   On: 09/14/2017 10:27   Ct Abdomen Pelvis W Contrast  Result Date: 09/13/2017 CLINICAL DATA:  Central chest pain on waking up this morning. The patient reports hemoptysis. EXAM: CT ABDOMEN AND PELVIS WITH CONTRAST TECHNIQUE: Multidetector CT imaging of the abdomen and pelvis was performed using the standard protocol following bolus administration of intravenous contrast. CONTRAST:  100 ml ISOVUE-300 IOPAMIDOL (ISOVUE-300) INJECTION 61% COMPARISON:  CT angiogram chest, abdomen and pelvis  05/18/2017 and 02/17/2011. FINDINGS: Lower chest: Small focus of airspace opacity and bronchiectasis in the right middle lobe is unchanged since 2012 consistent with some remote infectious or inflammatory process. Lung bases are otherwise clear. No pleural or pericardial effusion. Calcific aortic and coronary atherosclerosis noted. Hepatobiliary: Coarse calcification in the posterior right hepatic lobe is unchanged. The liver is somewhat low attenuating compatible with fatty infiltration. Gallbladder and biliary tree are unremarkable. Pancreas: Unremarkable. No pancreatic ductal dilatation or surrounding inflammatory changes. Spleen: Normal in size without focal abnormality. Adrenals/Urinary Tract: The adrenal glands appear normal. Large parapelvic cyst in the left kidney and a small cyst in the upper pole of the right kidney are unchanged. Ureters and urinary bladder are unremarkable. The adrenal glands appear normal. Stomach/Bowel: Diverticulosis without diverticulitis is most extensive in the sigmoid colon. The stomach appears normal. The small bowel is unremarkable. The appendix appears normal. Vascular/Lymphatic: The patient has extensive atherosclerotic vascular disease. 3.4 cm abdominal aortic aneurysm is unchanged. 1.5 cm right and 1.4 cm left common iliac artery aneurysms are also unchanged. No hemorrhage or dissection is identified. No lymphadenopathy. Reproductive: Prostatomegaly is seen. Other: No ascites or hernia. Musculoskeletal: No acute or focal abnormality. Mild remote superior endplate compression fracture of L1 and right hip replacement are noted. IMPRESSION: No acute abnormality abdomen and pelvis. Extensive atherosclerosis. No change in a 3.4 cm abdominal aortic aneurysm and bilateral common iliac artery aneurysms. Recommend followup by ultrasound in 3 years. This recommendation follows ACR consensus guidelines: White Paper of the ACR Incidental Findings Committee II on Vascular Findings. J Am  Coll Radiol 2013; 10:789-794. Diverticulosis without diverticulitis. Prostatomegaly. Electronically Signed   By: Inge Rise M.D.   On: 09/13/2017 10:59      Subjective: Pt adamant that he wants to go home, unwilling to stay overnight to have blood rechecked in AM.   Discharge Exam: Vitals:   09/14/17 1425 09/14/17 1458  BP: (!) 159/75 (!) 147/77  Pulse: 64 63  Resp: 19 16  Temp:  97.9 F (36.6 C)  SpO2: 97% 98%   Vitals:   09/14/17 1415 09/14/17 1420 09/14/17 1425 09/14/17 1458  BP: (!) 175/105 (!) 174/89 (!) 159/75 (!) 147/77  Pulse: 73 72 64 63  Resp: 19 20 19 16   Temp:    97.9 F (36.6 C)  TempSrc:    Oral  SpO2: 98% 97% 97% 98%  Weight:      Height:        General: Pt is alert, awake, not in acute distress Cardiovascular: normal S1/S2 +, no rubs, no gallops Respiratory: CTA bilaterally, no wheezing, no rhonchi Abdominal: Soft, NT, ND, bowel sounds + Extremities: no edema, no cyanosis   The results of significant diagnostics from this hospitalization (including imaging, microbiology, ancillary and laboratory) are listed below for reference.     Microbiology: No results found for this or any previous visit (from the past 240 hour(s)).   Labs: BNP (last 3 results) No results for input(s): BNP in the last 8760 hours. Basic Metabolic Panel: Recent Labs  Lab 09/13/17 0837 09/14/17 0448  NA 139 140  K 4.1 3.3*  CL 104 102  CO2 25 26  GLUCOSE 91 91  BUN 17 15  CREATININE 1.10 1.06  CALCIUM 9.2 8.9  MG  --  2.0   Liver Function Tests: Recent Labs  Lab 09/13/17 0837 09/14/17 0448  AST 37 32  ALT 17 16*  ALKPHOS 69 61  BILITOT 0.8 1.0  PROT 7.1 6.5  ALBUMIN 4.1 3.7   No results for input(s): LIPASE, AMYLASE in the last 168 hours. No results for input(s): AMMONIA in the last 168 hours. CBC: Recent Labs  Lab 09/13/17 0837 09/13/17 1844 09/14/17 0448  WBC 5.1  --  5.3  NEUTROABS 3.0  --   --   HGB 11.8* 14.0 11.5*  HCT 36.7* 43.1 35.6*   MCV 91.8  --  90.8  PLT 142*  --  133*   Cardiac Enzymes: Recent Labs  Lab 09/13/17 1305 09/13/17 1844 09/14/17 0030  TROPONINI <0.03 <0.03 <0.03   BNP: Invalid input(s): POCBNP CBG: No results for input(s): GLUCAP in the last 168 hours. D-Dimer No results for input(s): DDIMER in the last 72 hours. Hgb A1c No results for input(s): HGBA1C in the last 72 hours. Lipid Profile No results for input(s): CHOL, HDL, LDLCALC, TRIG, CHOLHDL, LDLDIRECT in the last 72 hours. Thyroid function studies No results for input(s): TSH, T4TOTAL, T3FREE, THYROIDAB in the last 72 hours.  Invalid input(s): FREET3 Anemia work up No results for input(s): VITAMINB12, FOLATE, FERRITIN, TIBC, IRON, RETICCTPCT in the last 72 hours. Urinalysis    Component Value Date/Time   COLORURINE YELLOW 06/11/2016 1125   APPEARANCEUR CLEAR 06/11/2016 1125   LABSPEC 1.019 06/11/2016 1125   PHURINE 5.0 06/11/2016 1125   GLUCOSEU NEGATIVE 06/11/2016 1125   HGBUR NEGATIVE 06/11/2016 1125   BILIRUBINUR NEGATIVE 06/11/2016 1125   KETONESUR NEGATIVE 06/11/2016 1125   PROTEINUR NEGATIVE 06/11/2016 1125   UROBILINOGEN 0.2 02/09/2015 1050   NITRITE NEGATIVE 06/11/2016 1125   LEUKOCYTESUR NEGATIVE 06/11/2016 1125   Sepsis Labs Invalid input(s): PROCALCITONIN,  WBC,  LACTICIDVEN Microbiology No results found for this or any previous visit (from the past 240 hour(s)).  Time coordinating discharge:   SIGNED:  Irwin Brakeman, MD  [facility] 09/14/2017, 5:59 PM Pager [phone]  If 7PM-7AM, please contact night-coverage [email or web address] Password [email or web address]

## 2017-09-14 NOTE — Op Note (Signed)
[facility] Patient Name: Jorge Nixon Procedure Date: 09/14/2017 1:00 PM MRN: [id number] Date of Birth: July 08, 1924 Attending MD: Hildred Laser , MD CSN: [id number] Age: 84 Admit Type: Inpatient Procedure:                Upper GI endoscopy Indications:              Suspected upper gastrointestinal bleeding Providers:                Hildred Laser, MD, Rosina Lowenstein, RN, Nelma Rothman,                            Technician Referring MD:             Irwin Brakeman, MD Medicines:                Lidocaine spray, Meperidine 25 mg IV, Midazolam 2                            mg IV Complications:            No immediate complications. Estimated Blood Loss:     Estimated blood loss: none. Procedure:                Pre-Anesthesia Assessment:                           - Prior to the procedure, a History and Physical                            was performed, and patient medications and                            allergies were reviewed. The patient's tolerance of                            previous anesthesia was also reviewed. The risks                            and benefits of the procedure and the sedation                            options and risks were discussed with the patient.                            All questions were answered, and informed consent                            was obtained. Prior Anticoagulants: The patient                            last took Xarelto (rivaroxaban) 2 days prior to the                            procedure. ASA Grade Assessment: III - A patient  with severe systemic disease. After reviewing the                            risks and benefits, the patient was deemed in                            satisfactory condition to undergo the procedure.                           After obtaining informed consent, the endoscope was                            passed under direct vision. Throughout the                            procedure, the  patient's blood pressure, pulse, and                            oxygen saturations were monitored continuously. The                            [id number] ([id number]) scope was introduced through the                            mouth, and advanced to the second part of duodenum.                            The upper GI endoscopy was accomplished without                            difficulty. The patient tolerated the procedure                            well. Scope In: 2:14:18 PM Scope Out: 2:19:12 PM Total Procedure Duration: 0 hours 4 minutes 54 seconds  Findings:      Blood and clot noted above left vocal cord.      The examined esophagus was normal.      The Z-line was irregular and was found 46 cm from the incisors.      The entire examined stomach was normal.      The duodenal bulb and second portion of the duodenum were normal. Impression:               - Normal esophagus.                           - Z-line irregular, 46 cm from the incisors.                           - Normal stomach.                           - Normal duodenal bulb and second portion of the  duodenum.                           - No specimens collected.                           Comment: Source of bleed larynx. Moderate Sedation:      Moderate (conscious) sedation was administered by the endoscopy nurse       and supervised by the endoscopist. The following parameters were       monitored: oxygen saturation, heart rate, blood pressure, CO2       capnography and response to care. Total physician intraservice time was       10 minutes. Recommendation:           - Return patient to hospital ward for ongoing care.                           - Resume previous diet today.                           - ENT consultation                           - Continue present medications. Procedure Code(s):        --- Professional ---                           [phone], Esophagogastroduodenoscopy, flexible,                             transoral; diagnostic, including collection of                            specimen(s) by brushing or washing, when performed                            (separate procedure)                           G0500, Moderate sedation services provided by the                            same physician or other qualified health care                            professional performing a gastrointestinal                            endoscopic service that sedation supports,                            requiring the presence of an independent trained                            observer to assist in the monitoring of the  patient's level of consciousness and physiological                            status; initial 15 minutes of intra-service time;                            patient age 82 years or older (additional time may                            be reported with [phone], as appropriate) Diagnosis Code(s):        --- Professional ---                           K22.8, Other specified diseases of esophagus CPT copyright 2017 [employer] Association. All rights reserved. The codes documented in this report are preliminary and upon coder review may  be revised to meet current compliance requirements. Hildred Laser, MD Hildred Laser, MD 09/14/2017 2:38:25 PM This report has been signed electronically. Number of Addenda: 0

## 2017-09-14 NOTE — Evaluation (Signed)
Physical Therapy Evaluation Patient Details Name: Jorge Nixon MRN: [id number] DOB: [birth year] Today's Date: 09/14/2017   History of Present Illness  Jorge Nixon is a 84 y.o. male with a history significant for CVA and TIA, pneumonia, atrial fibrillation, abdominal aortic aneurism, nephrolithiasis, hypothyroidism, hypertension, hyperlipidemia, GERD, esophageal dysmotility, DVT, diverticular disease, COPD, chronic kidney disease, bilateral carotid artery disease, and BPH. He takes Xarelto 15 mg daily by mouth for management of his a-fib.    Clinical Impression  Patient functioning near baseline for functional mobility and gait, except demonstrating slightly unsteady cadence with 1 near loss of balance while stumbling to the right using quad cane, able to self correct and this happened after patient became fatigued during gait training.  Patient tolerated sitting up in chair with his spouse present after therapy - RN notified.  Patient will benefit from continued physical therapy in hospital and recommended venue below to increase strength, balance, endurance for safe ADLs and gait.     Follow Up Recommendations Home health PT;Supervision - Intermittent    Equipment Recommendations  None recommended by PT    Recommendations for Other Services       Precautions / Restrictions Precautions Precautions: Fall Restrictions Weight Bearing Restrictions: No      Mobility  Bed Mobility Overal bed mobility: Modified Independent                Transfers Overall transfer level: Needs assistance Equipment used: Quad cane Transfers: Sit to/from [facility] Sit to Stand: Supervision Stand pivot transfers: Supervision          Ambulation/Gait Ambulation/Gait assistance: Min guard Ambulation Distance (Feet): 80 Feet Assistive device: Quad cane Gait Pattern/deviations: Decreased step length - left;Decreased step length - right;Decreased stride length;Staggering  right Gait velocity: decreased   General Gait Details: grossly WFL except occasional staggering to the right with near loss of balance able to self correct, tends to look down when walking, limited secondary to fatigue  Stairs            Wheelchair Mobility    Modified Rankin (Stroke Patients Only)       Balance Overall balance assessment: Mild deficits observed, not formally tested                                           Pertinent Vitals/Pain Pain Assessment: No/denies pain    Home Living Family/patient expects to be discharged to:: Private residence Living Arrangements: Spouse/significant other Available Help at Discharge: Family(limited help from his spouse) Type of Home: House Home Access: Stairs to enter Entrance Stairs-Rails: Right;Left;Can reach both Entrance Stairs-Number of Steps: 3 Home Layout: One level Home Equipment: Walker - 2 wheels;Cane - quad;Bedside commode      Prior Function Level of Independence: Independent with assistive device(s)         Comments: community ambulator with quad cane (QC), drives     Hand Dominance   Dominant Hand: Right    Extremity/Trunk Assessment   Upper Extremity Assessment Upper Extremity Assessment: Generalized weakness    Lower Extremity Assessment Lower Extremity Assessment: Generalized weakness    Cervical / Trunk Assessment Cervical / Trunk Assessment: Kyphotic  Communication   Communication: No difficulties  Cognition Arousal/Alertness: Awake/alert Behavior During Therapy: WFL for tasks assessed/performed Overall Cognitive Status: Within Functional Limits for tasks assessed  General Comments General comments (skin integrity, edema, etc.): fair/good standing balance using quad cane    Exercises     Assessment/Plan    PT Assessment Patient needs continued PT services  PT Problem List Decreased strength;Decreased  activity tolerance;Decreased balance;Decreased mobility       PT Treatment Interventions Gait training;Stair training;Functional mobility training;Therapeutic activities;Therapeutic exercise;Patient/family education    PT Goals (Current goals can be found in the Care Plan section)  Acute Rehab PT Goals Patient Stated Goal: return home PT Goal Formulation: With patient/family Time For Goal Achievement: 09/21/17 Potential to Achieve Goals: Good    Frequency Min 3X/week   Barriers to discharge        Co-evaluation               AM-PAC PT "6 Clicks" Daily Activity  Outcome Measure Difficulty turning over in bed (including adjusting bedclothes, sheets and blankets)?: None Difficulty moving from lying on back to sitting on the side of the bed? : None Difficulty sitting down on and standing up from a chair with arms (e.g., wheelchair, bedside commode, etc,.)?: None Help needed moving to and from a bed to chair (including a wheelchair)?: A Little Help needed walking in hospital room?: A Little Help needed climbing 3-5 steps with a railing? : A Little 6 Click Score: 21    End of Session   Activity Tolerance: Patient tolerated treatment well;Patient limited by fatigue Patient left: in chair;with call bell/phone within reach;with family/visitor present Nurse Communication: Mobility status;Other (comment)(RN notified patient left up in chair) PT Visit Diagnosis: Unsteadiness on feet (R26.81);Other abnormalities of gait and mobility (R26.89);Muscle weakness (generalized) (M62.81)    Time: 8502-7741 PT Time Calculation (min) (ACUTE ONLY): 27 min   Charges:   PT Evaluation $PT Eval Moderate Complexity: 1 Mod PT Treatments $Therapeutic Activity: 23-37 mins   PT G Codes:        3:42 PM, Oct 08, 2017 Lonell Grandchild, MPT Physical Therapist with [facility] [phone] office [phone] mobile phone

## 2017-09-14 NOTE — Progress Notes (Signed)
PROGRESS NOTE    Jorge Nixon  JQB:[id number]  DOB: [birth year]  DOA: 09/13/2017 PCP: Celene Squibb, MD   Brief Admission Hx:  Jorge Nixon is a 83 y.o. male with a history significant for CVA and TIA, pneumonia, atrial fibrillation, abdominal aortic aneurism, nephrolithiasis, hypothyroidism, hypertension, hyperlipidemia, GERD, esophageal dysmotility, DVT, diverticular disease, COPD, chronic kidney disease, bilateral carotid artery disease, and BPH. He takes Xarelto 15 mg daily by mouth for management of his a-fib. He presented in the ED from home with family complaining of hematemesis several times through the night that started about 12 hours ago.  MDM/Assessment & Plan:    1. Hematemesis - Consulted with GI. Will hold Xeralto.  EGD revealed bleeding source is from larynx.  Will have patient follow up with ENT Dr. Benjamine Mola when discharged tomorrow.  Recheck H/H in AM.   2. Weakness generalized - Physical therapy evaluation recommending HH services PT.  3. Paroxysmal atrial fibrillation - Holding Xeralto, to restart after consult with  ENT.  Rate controlled. 4. Protein-Calorie malnutrition - heart healthy diet, Dietitian consulted.  5. Hypertension - Controlled with oral medications. Will continue with home meds while inpatient. 6. History of stroke - In June 14, 2016 with subsequent CVA was placed on Xeralto.  Holding Xarelto until he has seen ENT.  7. Chronic Kidney disease stage III - Creatinine at base line 1.10, GFR 57. 8. Hyperlipidemia - Controlled with oral medication. Will continue home meds while inpatient.  9. Left-sided chest pain -  troponin negative x 3 and chest xray negative. EKG shows A-fib that is rate controlled. Patient takes Nutritional therapist. May restart after consult with GI. 10. Thrombocytopenia - Chronic issue; patient is currently at baseline. Monitor CBC. 11. Hypothyroidism - Controlled with oral medication. Will continue with home meds while inpatient. 12. Acute blood loss  anemia - Holding Xeralto. Recheck CBC in morning. 13. RUE paresthesias - Check CT to rule out acute CVA. Suspect this is radiculopathy from sleeping on right shoulder.    DVT Prophylaxis: SCD Code Status: FULL CODE  Family Communication: Wife and daughter at bedside during patient interview.  Disposition Plan: Consult with GI Dr. Laural Golden.    Consultants:  GI  Procedures:  EGD 4/18  Subjective: Pt complained of numbness in right fingers and some weakness RUE.   Objective: Vitals:   09/14/17 1415 09/14/17 1420 09/14/17 1425 09/14/17 1458  BP: (!) 175/105 (!) 174/89 (!) 159/75 (!) 147/77  Pulse: 73 72 64 63  Resp: 19 20 19 16   Temp:    97.9 F (36.6 C)  TempSrc:    Oral  SpO2: 98% 97% 97% 98%  Weight:      Height:        Intake/Output Summary (Last 24 hours) at 09/14/2017 1618 Last data filed at 09/14/2017 1423 Gross per 24 hour  Intake 1380 ml  Output -  Net 1380 ml   Filed Weights   09/13/17 0818  Weight: 60.3 kg (133 lb)     REVIEW OF SYSTEMS  As per history otherwise all reviewed and reported negative  Exam:  General exam: elderly male Respiratory system: Clear. No increased work of breathing. Cardiovascular system: S1 & S2 heard, RRR. No JVD, murmurs, gallops, clicks or pedal edema. Gastrointestinal system: Abdomen is nondistended, soft and nontender. Normal bowel sounds heard. Central nervous system: Alert and oriented. No focal neurological deficits, except mild sensory deficits right fingers 1-4. Extremities: no CCE.  Data Reviewed: Basic Metabolic Panel: Recent Labs  Lab 09/13/17 0837 09/14/17 0448  NA 139 140  K 4.1 3.3*  CL 104 102  CO2 25 26  GLUCOSE 91 91  BUN 17 15  CREATININE 1.10 1.06  CALCIUM 9.2 8.9  MG  --  2.0   Liver Function Tests: Recent Labs  Lab 09/13/17 0837 09/14/17 0448  AST 37 32  ALT 17 16*  ALKPHOS 69 61  BILITOT 0.8 1.0  PROT 7.1 6.5  ALBUMIN 4.1 3.7   No results for input(s): LIPASE, AMYLASE in the  last 168 hours. No results for input(s): AMMONIA in the last 168 hours. CBC: Recent Labs  Lab 09/13/17 0837 09/13/17 1844 09/14/17 0448  WBC 5.1  --  5.3  NEUTROABS 3.0  --   --   HGB 11.8* 14.0 11.5*  HCT 36.7* 43.1 35.6*  MCV 91.8  --  90.8  PLT 142*  --  133*   Cardiac Enzymes: Recent Labs  Lab 09/13/17 1305 09/13/17 1844 09/14/17 0030  TROPONINI <0.03 <0.03 <0.03   CBG (last 3)  No results for input(s): GLUCAP in the last 72 hours. No results found for this or any previous visit (from the past 240 hour(s)).   Studies: Dg Chest 2 View  Result Date: 09/13/2017 CLINICAL DATA:  Chest pain, shortness of breath. EXAM: CHEST - 2 VIEW COMPARISON:  Radiographs and CT scan of May 18, 2017. FINDINGS: The heart size and mediastinal contours are within normal limits. No pneumothorax or pleural effusion is noted. Atherosclerosis of thoracic aorta is noted. No acute pulmonary disease is noted. Stable right middle lobe scarring is noted. The visualized skeletal structures are unremarkable. IMPRESSION: No active cardiopulmonary disease. Aortic Atherosclerosis (ICD10-I70.0). Electronically Signed   By: Marijo Conception, M.D.   On: 09/13/2017 09:15   Ct Head Wo Contrast  Result Date: 09/14/2017 CLINICAL DATA:  84 year old male with headache. Right upper extremity numbness. EXAM: CT HEAD WITHOUT CONTRAST TECHNIQUE: Contiguous axial images were obtained from the base of the skull through the vertex without intravenous contrast. COMPARISON:  Head CT without contrast and brain MRI 06/12/2016, and earlier. FINDINGS: Brain: The small right peri rolandic cortical infarct seen in January demonstrates subtle encephalomalacia (series 2, image 24). Gray-white matter differentiation elsewhere in the hemispheres appears stable and normal for age. A small chronic left cerebellar lacune was better demonstrated by MRI. No midline shift, ventriculomegaly, mass effect, evidence of mass lesion, intracranial  hemorrhage or evidence of cortically based acute infarction. Vascular: Calcified atherosclerosis at the skull base. No suspicious intracranial vascular hyperdensity. Skull: No acute osseous abnormality identified. Sinuses/Orbits: Clear. Other: Visualized orbit soft tissues are within normal limits. Scalp soft tissues appear within normal limits today. IMPRESSION: 1. No acute intracranial abnormality. 2. Expected evolution of the small right peri rolandic cortical infarct in January, with subtle encephalomalacia by CT. Electronically Signed   By: Genevie Ann M.D.   On: 09/14/2017 10:27   Ct Abdomen Pelvis W Contrast  Result Date: 09/13/2017 CLINICAL DATA:  Central chest pain on waking up this morning. The patient reports hemoptysis. EXAM: CT ABDOMEN AND PELVIS WITH CONTRAST TECHNIQUE: Multidetector CT imaging of the abdomen and pelvis was performed using the standard protocol following bolus administration of intravenous contrast. CONTRAST:  100 ml ISOVUE-300 IOPAMIDOL (ISOVUE-300) INJECTION 61% COMPARISON:  CT angiogram chest, abdomen and pelvis 05/18/2017 and 02/17/2011. FINDINGS: Lower chest: Small focus of airspace opacity and bronchiectasis in the right middle lobe is unchanged since 2012 consistent with some remote infectious or inflammatory process. Lung  bases are otherwise clear. No pleural or pericardial effusion. Calcific aortic and coronary atherosclerosis noted. Hepatobiliary: Coarse calcification in the posterior right hepatic lobe is unchanged. The liver is somewhat low attenuating compatible with fatty infiltration. Gallbladder and biliary tree are unremarkable. Pancreas: Unremarkable. No pancreatic ductal dilatation or surrounding inflammatory changes. Spleen: Normal in size without focal abnormality. Adrenals/Urinary Tract: The adrenal glands appear normal. Large parapelvic cyst in the left kidney and a small cyst in the upper pole of the right kidney are unchanged. Ureters and urinary bladder are  unremarkable. The adrenal glands appear normal. Stomach/Bowel: Diverticulosis without diverticulitis is most extensive in the sigmoid colon. The stomach appears normal. The small bowel is unremarkable. The appendix appears normal. Vascular/Lymphatic: The patient has extensive atherosclerotic vascular disease. 3.4 cm abdominal aortic aneurysm is unchanged. 1.5 cm right and 1.4 cm left common iliac artery aneurysms are also unchanged. No hemorrhage or dissection is identified. No lymphadenopathy. Reproductive: Prostatomegaly is seen. Other: No ascites or hernia. Musculoskeletal: No acute or focal abnormality. Mild remote superior endplate compression fracture of L1 and right hip replacement are noted. IMPRESSION: No acute abnormality abdomen and pelvis. Extensive atherosclerosis. No change in a 3.4 cm abdominal aortic aneurysm and bilateral common iliac artery aneurysms. Recommend followup by ultrasound in 3 years. This recommendation follows ACR consensus guidelines: White Paper of the ACR Incidental Findings Committee II on Vascular Findings. J Am Coll Radiol 2013; 10:789-794. Diverticulosis without diverticulitis. Prostatomegaly. Electronically Signed   By: Inge Rise M.D.   On: 09/13/2017 10:59     Scheduled Meds: . atorvastatin  20 mg Oral Daily  . docusate sodium  100 mg Oral QHS  . feeding supplement (ENSURE ENLIVE)  237 mL Oral TID BM  . hydrochlorothiazide  6.25 mg Oral Daily   And  . losartan  25 mg Oral Daily  . levothyroxine  88 mcg Oral QAC breakfast  . lidocaine      . meperidine      . midazolam      . psyllium  1 packet Oral Daily  . senna  1 tablet Oral QHS  . sodium chloride flush  3 mL Intravenous Q12H   Continuous Infusions: . sodium chloride      Principal Problem:   Hematemesis Active Problems:   Weakness generalized   Protein-calorie malnutrition, severe (HCC)   Hypertension   History of CEA (carotid endarterectomy)   History of stroke   Hyperlipidemia    Left-sided chest pain   PAF (paroxysmal atrial fibrillation) (HCC)   AAA (abdominal aortic aneurysm) without rupture (HCC)   Renal artery stenosis (HCC)   Thrombocytopenia (HCC)   Hypothyroidism   Acute blood loss anemia   Abdominal pain, epigastric   Time spent:   Irwin Brakeman, MD, FAAFP [facility] Pager [phone]  If 7PM-7AM, please contact night-coverage [email or web address] Password [email or web address] 09/14/2017, 4:18 PM    LOS: 1 day

## 2017-09-14 NOTE — Progress Notes (Signed)
  Subjective:  Patient denies nausea vomiting or hematemesis.  He has not spit up any more blood.  He complains of epigastric pain.  He complains of numbness in the right thumb.  He has been evaluated by Dr. Wynetta Emery CT has been ordered.  Objective: Blood pressure (!) 181/99, pulse 66, temperature 97.7 F (36.5 C), temperature source Oral, resp. rate 18, height 5\' 10"  (1.778 m), weight 133 lb (60.3 kg), SpO2 99 %. Patient is alert and in no acute distress.  Labs/studies Results:  Recent Labs    2017/09/27 0837 09-27-17 1844 09/14/17 0448  WBC 5.1  --  5.3  HGB 11.8* 14.0 11.5*  HCT 36.7* 43.1 35.6*  PLT 142*  --  133*    BMET  Recent Labs    09-27-17 0837 09/14/17 0448  NA 139 140  K 4.1 3.3*  CL 104 102  CO2 25 26  GLUCOSE 91 91  BUN 17 15  CREATININE 1.10 1.06  CALCIUM 9.2 8.9    LFT  Recent Labs    2017/09/27 0837 09/14/17 0448  PROT 7.1 6.5  ALBUMIN 4.1 3.7  AST 37 32  ALT 17 16*  ALKPHOS 69 61  BILITOT 0.8 1.0  BILIDIR 0.2  --   IBILI 0.6  --      Assessment:  #1.  Possible upper GI bleed.  Patient's hemoglobin on admission was 11.8.  Second hemoglobin was 14.0 last evening and one from this morning is 11.5.  Hemoglobin of 11.5 more than likely this reading from few months.  Therefore second hemoglobin value may be incorrect.  No more spitting up of blood.  He does complain of epigastric pain.  He could have a peptic ulcer disease.  #2.  Numbness and right thumb.  She has been evaluated by Dr. Irwin Brakeman and he does not have any other neuro deficits.  He is scheduled for head CT later today.   Recommendation:  Esophagogastroduodenoscopy later today unless head CT shows acute abnormality.

## 2017-09-14 NOTE — Plan of Care (Signed)
  Problem: Acute Rehab PT Goals(only PT should resolve) Goal: Patient Will Transfer Sit To/From Stand Outcome: Progressing Flowsheets (Taken 09/14/2017 1543) Patient will transfer sit to/from stand: with modified independence Goal: Pt Will Transfer Bed To Chair/Chair To Bed Outcome: Progressing Flowsheets (Taken 09/14/2017 1543) Pt will Transfer Bed to Chair/Chair to Bed: with modified independence Goal: Pt Will Ambulate Outcome: Progressing Flowsheets (Taken 09/14/2017 1543) Pt will Ambulate: 100 feet;with supervision;with cane Note:  Using quad cane   3:44 PM, 09/14/17 Lonell Grandchild, MPT Physical Therapist with [facility] [phone] office [phone] mobile phone

## 2017-09-15 NOTE — Care Management (Signed)
Late Entry: Pt seen after PT eval on 09/14/17. Discussed HH PT referral. Pt lives with wife, has supportive children. He has had HH in the past, does not want it now. Says he ambulates "just fine" with his cane. We discuss option of OP PT. He declines that referral also. Pt encouraged to notify staff if he changes his mind.

## 2017-09-18 ENCOUNTER — Telehealth: Payer: Self-pay | Admitting: Student

## 2017-09-18 NOTE — Telephone Encounter (Signed)
Family member notified that pt should remain off Xarelto

## 2017-09-18 NOTE — Telephone Encounter (Signed)
Thank you for the update. Recent hospital notes reviewed and he had an EGD performed at that time with no acute findings but bleeding source was thought to be the Larynx. Agree with holding Xarelto in the setting of active bleeding and resuming once safe from ENT perspective. Thank you.   Signed, Erma Heritage, PA-C 09/18/2017, 10:04 AM Pager: [phone]

## 2017-09-18 NOTE — Telephone Encounter (Signed)
Patient was advised at hospital [facility]) to stop taking blood thinners. Wanted to check with Cardiologist prior to doing so. Please return call. / tg

## 2017-09-18 NOTE — Telephone Encounter (Signed)
Pt had hemoptysis, has to see Dr Benjamine Mola, told to hold xarelto until then

## 2017-09-20 ENCOUNTER — Encounter (HOSPITAL_COMMUNITY): Payer: Self-pay | Admitting: Internal Medicine

## 2017-10-03 ENCOUNTER — Other Ambulatory Visit: Payer: Self-pay | Admitting: Adult Health

## 2017-10-09 ENCOUNTER — Other Ambulatory Visit: Payer: Self-pay | Admitting: Physician Assistant

## 2017-10-10 NOTE — Telephone Encounter (Signed)
I spoke with patient, he saw ENT and has already re-started Xarelto 15 mg daily, without further bleeding noted

## 2017-11-03 ENCOUNTER — Ambulatory Visit (HOSPITAL_COMMUNITY)
Admission: RE | Admit: 2017-11-03 | Discharge: 2017-11-03 | Disposition: A | Discharge status: Home / Self Care | Payer: Medicare Other | Source: Ambulatory Visit | Attending: Family | Admitting: Family

## 2017-11-03 ENCOUNTER — Encounter: Payer: Self-pay | Admitting: Family

## 2017-11-03 ENCOUNTER — Ambulatory Visit (INDEPENDENT_AMBULATORY_CARE_PROVIDER_SITE_OTHER): Payer: Medicare Other | Admitting: Family

## 2017-11-03 VITALS — BP 162/74 | HR 60 | Temp 97.6°F | Ht 70.0 in | Wt 131.4 lb

## 2017-11-03 DIAGNOSIS — Z8673 Personal history of transient ischemic attack (TIA), and cerebral infarction without residual deficits: Secondary | ICD-10-CM | POA: Diagnosis not present

## 2017-11-03 DIAGNOSIS — I6522 Occlusion and stenosis of left carotid artery: Secondary | ICD-10-CM | POA: Diagnosis not present

## 2017-11-03 DIAGNOSIS — Z9889 Other specified postprocedural states: Secondary | ICD-10-CM | POA: Diagnosis not present

## 2017-11-03 DIAGNOSIS — I6521 Occlusion and stenosis of right carotid artery: Secondary | ICD-10-CM | POA: Diagnosis present

## 2017-11-03 NOTE — Patient Instructions (Signed)

## 2017-11-03 NOTE — Progress Notes (Signed)
Chief Complaint: Follow up Extracranial Carotid Artery Stenosis   History of Present Illness  Jorge Nixon is a 84 y.o. male who returns for follow up s/p L CEA (Date: 03/11/13) by Dr. Bridgett Larsson.  He had 2 TIA's within a week before the left CEA as manifested by right arm tingling and weakness that resolved within a few minutes  He had a mild CVA on 06-11-16, was transferred from [facility] to [facility], discharged on 06-14-16.  He received t-PA. Mri and Mra of the head w/o contrast showed a nonhemorrhagic cortical infarct in the posterior right frontal lobe along the pre frontal gyrus.  He has no residual neurological deficits.   He states his appetite is good.   He walks a mile, five days/week.    Pt Diabetic: No Tobacco use: non-smoker  Pt meds include: Statin : yes ASA: no Other anticoagulants/antiplatelets: Xarelto since the CVA on 06-11-16   Past Medical History:  Diagnosis Date  . Anemia   . Arthritis   . BPH (benign prostatic hyperplasia)   . Carotid disease, bilateral ([place])    a. s/p L CEA in 2014. b. Duplex 05/2016: Right 40-59% ICA stenosis; Left 1-39% ICA stenosis  . CKD (chronic kidney disease), stage III ([place])   . COPD (chronic obstructive pulmonary disease) ([place])   . Diverticular disease 02/14/2011   problems swallowing  . DVT (deep venous thrombosis) ([place])   . Esophageal dysmotility   . Esophageal web   . GERD (gastroesophageal reflux disease)   . Headache(784.0)   . History of blood transfusion   . Hyperlipidemia   . Hypertension   . Hypothyroidism   . Kidney stone   . Nephrolithiasis    lithotripsy 2006  . Non-ischemic cardiomyopathy ([place])    a. Prior EF 45%, normal nuc 2012. b. EF 50-55% in 2018.  [name] PAF (paroxysmal atrial fibrillation) ([place])    a. identified on monitor 06/2016.  [name] Pneumonia    hx of - hospitalized  . Stroke ([place]) 05/2016  . TIA (transient ischemic attack)     Social History Social History   Tobacco Use  . Smoking status: Never Smoker   . Smokeless tobacco: Never Used  Substance Use Topics  . Alcohol use: No    Alcohol/week: 0.0 oz  . Drug use: No    Family History Family History  Problem Relation Age of Onset  . Stroke Father   . Heart attack Father   . Hypertension Mother   . Hypertension Unknown   . Diabetes Unknown     Surgical History Past Surgical History:  Procedure Laterality Date  . CAROTID ENDARTERECTOMY Left 03-11-13   cea  . carpel tunnel Right   . ENDARTERECTOMY Left 03/11/2013   Procedure: ENDARTERECTOMY CAROTID with Vascu-Guard Bovine Patch.;  Surgeon: Conrad Dixmoor, MD;  Location: [facility];  Service: Vascular;  Laterality: Left;  . EP IMPLANTABLE DEVICE N/A 06/14/2016   Procedure: Loop Recorder Insertion;  Surgeon: Will Meredith Leeds, MD;  Location: [facility];  Service: Cardiovascular;  Laterality: N/A;  . ESOPHAGOGASTRODUODENOSCOPY N/A 09/14/2017   Procedure: ESOPHAGOGASTRODUODENOSCOPY (EGD);  Surgeon: Rogene Houston, MD;  [facility];  Service: Endoscopy;  Laterality: N/A;  . JOINT REPLACEMENT Right    hip  . JOINT REPLACEMENT Left    knee  . KNEE ARTHROSCOPY Right 2010  . LOOP RECORDER REMOVAL N/A 08/23/2016   Procedure: Loop Recorder Removal;  Surgeon: Will Meredith Leeds, MD;  Location: [facility];  Service: Cardiovascular;  Laterality: N/A;  . REPLACEMENT TOTAL KNEE Left 2006   left knee  . TOTAL HIP ARTHROPLASTY Right august 6th 2012   right side   . TRANSURETHRAL RESECTION OF PROSTATE  1996    Allergies  Allergen Reactions  . Sulfa Antibiotics Rash  . Milk-Related Compounds Other (See Comments)    Upset stomach    Current Outpatient Medications  Medication Sig Dispense Refill  . acetaminophen (TYLENOL) 500 MG tablet Take 1,000 mg by mouth daily as needed for headache.     [name] atorvastatin (LIPITOR) 20 MG tablet TAKE 1 TABLET BY MOUTH ONCE DAILY AT  6  PM 90 tablet 3  . docusate sodium (COLACE) 100 MG capsule Take 100 mg by mouth at bedtime.    [name]  levothyroxine (SYNTHROID, LEVOTHROID) 88 MCG tablet Take 88 mcg by mouth daily.     [name] losartan-hydrochlorothiazide (HYZAAR) 50-12.5 MG tablet Take 0.5 tablets by mouth daily. Takes 1/2 tablet daily    . Multiple Vitamins-Minerals (CENTRUM ADULTS PO) Take 1 tablet by mouth daily.    . Nutritional Supplements (ENSURE COMPLETE PO) Take 237 mLs by mouth See admin instructions. Drink one can (237 mls) by mouth every other night    . Psyllium (METAMUCIL PO) Take 2 scoop by mouth at bedtime. Mix in liquid and drink    . senna (SENOKOT) 8.6 MG TABS tablet Take 1 tablet by mouth at bedtime.    Alveda Reasons 15 MG TABS tablet TAKE 1 TABLET BY MOUTH DAILY WITH SUPPER 30 tablet 11   No current facility-administered medications for this visit.     Review of Systems : See HPI for pertinent positives and negatives.  Physical Examination  Vitals:   11/03/17 1032 11/03/17 1035  BP: (!) 153/80 (!) 162/74  Pulse: 60   Temp: 97.6 F (36.4 C)   TempSrc: Oral   SpO2: 99%   Weight: 131 lb 6.4 oz (59.6 kg)   Height: 5\' 10"  (1.778 m)    Body mass index is 18.85 kg/m.  General:WDWN elderly male in NAD GAIT:using quad cane, slow, deliberate Eyes: PERRLA Pulmonary: Respirations are non-labored, CTAB, adequate air movement.  Cardiac: Regular rhythm and rate, no detected murmur.  VASCULAR EXAM Carotid Bruits Left Right   Negative Negative    Abdominal aortic pulse is not palpable. Radial pulses are are 2+ palpable and =.      LE Pulses LEFT RIGHT   POPLITEAL not palpable  not palpable   POSTERIOR TIBIAL not palpable  not palpable    DORSALIS PEDIS  ANTERIOR TIBIAL 1+ palpable  1+ palpable     Gastrointestinal: soft, nontender, BS WNL, no r/g,no palpable masses. Musculoskeletal: No muscle atrophy/wasting.  M/S 4/5 throughout, Extremities without ischemic changes. Neurologic: A&O X 3; Appropriate Affect, speech is normal, CN 2-12 intactexcept has significant hearing loss, Pain and light touch intact in extremities, Motor exam as listed above Skin: No rashes, no ulcers, no cellulitis.  Purpuric areas on forearms.  Psychiatric: Normal thought content, mood appropriate to clinical situation.    Assessment: Jorge Nixon is a 84 y.o. male who is s/p L CEA (Date: 03/11/13); during surgery it was found that he had carotid stenosis >70%. He had 2 TIA's within a week before the left CEA as manifested by right arm tingling and weakness that resolved within a few minutes. He had a cryptogenic CVA on 06-11-16, treated with t-PA at [facility]. He has no residual neurological deficit.   Fortunately he does not  have DM, has never used tobacco, and remains physically active. He was started on Xarelto after the January 2018 CVA, and a statin was also started at that time.   DATA:  Carotid Duplex (11-03-17):  40-59% right internal carotid artery stenosis. Patent left carotid endarterectomy site with no evidence of restenosis. Intimal thickening at patch. Bilateral vertebral artery flow is antegrade.  Bilateral subclavian artery waveforms are normal.  No significant change compared to the exam on 10-28-16.    Ct Head Wo Contrast 06/12/2016 1. Acute nonhemorrhagic cortical infarct within the right precentral gyrus. 2. No additional infarcts. 3. Chronic atrophy and white matter disease is otherwise stable.  4. Atherosclerosis.   Mri andMra headWo Contrast 06/12/2016 1. Acute nonhemorrhagic cortical infarct in the posterior right frontal lobe along the pre frontal gyrus. 2. Additional cortical infarcts slightly more anterior in the right frontal lobe. 3. Moderate diffuse periventricular and subcortical white matter disease bilaterally suggesting extensive small vessel disease. 4. MRA circle of Willis is moderately  degraded by patient motion but suggests diffuse small vessel disease. 5. Mild-to-moderate narrowing of the distal left M1 segment and proximal right PICA. 6. Moderate narrowing of the proximal right P2 segment. 7. Mild atherosclerotic narrowing of the proximal internal carotid artery bilaterally in the neck without focal stenosis.    Plan: Follow-up in 1year with Carotid Duplex scan.    I discussed in depth with the patient the nature of atherosclerosis, and emphasized the importance of maximal medical management including strict control of blood pressure, blood glucose, and lipid levels, obtaining regular exercise, and continued cessation of smoking.  The patient is aware that without maximal medical management the underlying atherosclerotic disease process will progress, limiting the benefit of any interventions. The patient was given information about stroke prevention and what symptoms should prompt the patient to seek immediate medical care. Thank you for allowing [place] to participate in this patient's care.  Clemon Chambers, RN, MSN, FNP-C [facility] Office: [phone]  Clinic Physician: Donzetta Matters  11/03/17 11:06 AM

## 2018-02-10 ENCOUNTER — Other Ambulatory Visit: Payer: Self-pay

## 2018-02-10 ENCOUNTER — Emergency Department (HOSPITAL_COMMUNITY)
Admission: EM | Admit: 2018-02-10 | Discharge: 2018-02-10 | Disposition: A | Discharge status: Home / Self Care | Payer: Medicare Other | Attending: Emergency Medicine | Admitting: Emergency Medicine

## 2018-02-10 ENCOUNTER — Emergency Department (HOSPITAL_COMMUNITY): Payer: Medicare Other

## 2018-02-10 ENCOUNTER — Encounter (HOSPITAL_COMMUNITY): Payer: Self-pay | Admitting: Emergency Medicine

## 2018-02-10 DIAGNOSIS — Z79899 Other long term (current) drug therapy: Secondary | ICD-10-CM | POA: Diagnosis not present

## 2018-02-10 DIAGNOSIS — Z96641 Presence of right artificial hip joint: Secondary | ICD-10-CM | POA: Diagnosis not present

## 2018-02-10 DIAGNOSIS — E039 Hypothyroidism, unspecified: Secondary | ICD-10-CM | POA: Diagnosis not present

## 2018-02-10 DIAGNOSIS — N183 Chronic kidney disease, stage 3 (moderate): Secondary | ICD-10-CM | POA: Diagnosis not present

## 2018-02-10 DIAGNOSIS — I129 Hypertensive chronic kidney disease with stage 1 through stage 4 chronic kidney disease, or unspecified chronic kidney disease: Secondary | ICD-10-CM | POA: Insufficient documentation

## 2018-02-10 DIAGNOSIS — J449 Chronic obstructive pulmonary disease, unspecified: Secondary | ICD-10-CM | POA: Insufficient documentation

## 2018-02-10 DIAGNOSIS — I480 Paroxysmal atrial fibrillation: Secondary | ICD-10-CM | POA: Insufficient documentation

## 2018-02-10 DIAGNOSIS — Z8673 Personal history of transient ischemic attack (TIA), and cerebral infarction without residual deficits: Secondary | ICD-10-CM | POA: Insufficient documentation

## 2018-02-10 DIAGNOSIS — Z96652 Presence of left artificial knee joint: Secondary | ICD-10-CM | POA: Diagnosis not present

## 2018-02-10 DIAGNOSIS — R42 Dizziness and giddiness: Secondary | ICD-10-CM | POA: Diagnosis present

## 2018-02-10 DIAGNOSIS — Z86718 Personal history of other venous thrombosis and embolism: Secondary | ICD-10-CM | POA: Diagnosis not present

## 2018-02-10 DIAGNOSIS — Z7901 Long term (current) use of anticoagulants: Secondary | ICD-10-CM | POA: Insufficient documentation

## 2018-02-10 LAB — DIFFERENTIAL
BASOS ABS: 0 10*3/uL (ref 0.0–0.1)
Basophils Relative: 1 %
EOS ABS: 0.6 10*3/uL (ref 0.0–0.7)
Eosinophils Relative: 9 %
LYMPHS ABS: 0.9 10*3/uL (ref 0.7–4.0)
LYMPHS PCT: 13 %
Monocytes Absolute: 0.8 10*3/uL (ref 0.1–1.0)
Monocytes Relative: 11 %
NEUTROS PCT: 66 %
Neutro Abs: 4.8 10*3/uL (ref 1.7–7.7)

## 2018-02-10 LAB — BASIC METABOLIC PANEL
Anion gap: 8 (ref 5–15)
BUN: 21 mg/dL (ref 8–23)
CALCIUM: 9.1 mg/dL (ref 8.9–10.3)
CHLORIDE: 103 mmol/L (ref 98–111)
CO2: 30 mmol/L (ref 22–32)
Creatinine, Ser: 1.2 mg/dL (ref 0.61–1.24)
GFR calc non Af Amer: 51 mL/min — ABNORMAL LOW (ref >60)
GFR, EST AFRICAN AMERICAN: 60 mL/min — AB (ref >60)
Glucose, Bld: 95 mg/dL (ref 70–99)
Potassium: 3.7 mmol/L (ref 3.5–5.1)
SODIUM: 141 mmol/L (ref 135–145)

## 2018-02-10 LAB — HEPATIC FUNCTION PANEL
ALBUMIN: 3.8 g/dL (ref 3.5–5.0)
ALT: 14 U/L (ref 0–44)
AST: 28 U/L (ref 15–41)
Alkaline Phosphatase: 56 U/L (ref 38–126)
BILIRUBIN TOTAL: 0.8 mg/dL (ref 0.3–1.2)
Bilirubin, Direct: 0.1 mg/dL (ref 0.0–0.2)
Indirect Bilirubin: 0.7 mg/dL (ref 0.3–0.9)
TOTAL PROTEIN: 7.6 g/dL (ref 6.5–8.1)

## 2018-02-10 LAB — CBG MONITORING, ED: GLUCOSE-CAPILLARY: 99 mg/dL (ref 70–99)

## 2018-02-10 LAB — CBC
HCT: 36.3 % — ABNORMAL LOW (ref 39.0–52.0)
HEMOGLOBIN: 11.8 g/dL — AB (ref 13.0–17.0)
MCH: 29.9 pg (ref 26.0–34.0)
MCHC: 32.5 g/dL (ref 30.0–36.0)
MCV: 91.9 fL (ref 78.0–100.0)
Platelets: 204 10*3/uL (ref 150–400)
RBC: 3.95 MIL/uL — AB (ref 4.22–5.81)
RDW: 13.6 % (ref 11.5–15.5)
WBC: 6.8 10*3/uL (ref 4.0–10.5)

## 2018-02-10 LAB — TROPONIN I: Troponin I: <0.03 ng/mL (ref <0.03)

## 2018-02-10 MED ORDER — SODIUM CHLORIDE 0.9 % IV BOLUS
500.0000 mL | Freq: Once | INTRAVENOUS | Status: AC
  Administered 2018-02-10: 500 mL via INTRAVENOUS

## 2018-02-10 NOTE — ED Notes (Signed)
Pt reports that Dr Nevada Crane collected a urine spec in his office this am

## 2018-02-10 NOTE — ED Provider Notes (Signed)
[facility] EMERGENCY DEPARTMENT Provider Note   CSN: [id number] Arrival date & time: 02/10/18  1049     History   Chief Complaint Chief Complaint  Patient presents with  . Dizziness    HPI Jorge Nixon is a 84 y.o. male.  Patient states he was over the doctor's office and felt dizzy and his blood pressure was low.  Patient feeling better here  The history is provided by the patient. No language interpreter was used.  Illness  This is a new problem. The current episode started 1 to 2 hours ago. The problem occurs rarely. The problem has been resolved. Pertinent negatives include no chest pain, no abdominal pain and no headaches. Nothing aggravates the symptoms. Nothing relieves the symptoms. He has tried nothing for the symptoms. The treatment provided no relief.    Past Medical History:  Diagnosis Date  . Anemia   . Arthritis   . BPH (benign prostatic hyperplasia)   . Carotid disease, bilateral ([place])    a. s/p L CEA in 2014. b. Duplex 05/2016: Right 40-59% ICA stenosis; Left 1-39% ICA stenosis  . CKD (chronic kidney disease), stage III ([place])   . COPD (chronic obstructive pulmonary disease) ([place])   . Diverticular disease 02/14/2011   problems swallowing  . DVT (deep venous thrombosis) ([place])   . Esophageal dysmotility   . Esophageal web   . GERD (gastroesophageal reflux disease)   . Headache(784.0)   . History of blood transfusion   . Hyperlipidemia   . Hypertension   . Hypothyroidism   . Kidney stone   . Nephrolithiasis    lithotripsy 2006  . Non-ischemic cardiomyopathy ([place])    a. Prior EF 45%, normal nuc 2012. b. EF 50-55% in 2018.  [name] PAF (paroxysmal atrial fibrillation) ([place])    a. identified on monitor 06/2016.  [name] Pneumonia    hx of - hospitalized  . Stroke ([place]) 05/2016  . TIA (transient ischemic attack)     Patient Active Problem List   Diagnosis Date Noted  . Hematemesis 09/13/2017  . Hypothyroidism 09/13/2017  . Acute blood loss anemia 09/13/2017  .  Abdominal pain, epigastric 09/13/2017  . Hematochezia   . Constipation   . Left-sided chest pain 05/18/2017  . PAF (paroxysmal atrial fibrillation) ([place]) 05/18/2017  . CKD (chronic kidney disease) stage 3, GFR 30-59 ml/min (HCC) 05/18/2017  . Thoracic aortic aneurysm without rupture ([place]) 05/18/2017  . AAA (abdominal aortic aneurysm) without rupture ([place]) 05/18/2017  . Renal artery stenosis ([place]) 05/18/2017  . Thrombocytopenia ([place]) 05/18/2017  . Carotid stenosis R 06/14/2016  . History of CEA (carotid endarterectomy)   . History of stroke   . Hyperlipidemia   . CVA (cerebral vascular accident) ([place]) - sm R cortical cryptogenic infarct 06/11/2016  . Aftercare following surgery of the circulatory system, [place] 10/11/2013  . CVA (cerebral infarction) 02/23/2013  . Occlusion and stenosis of carotid artery with cerebral infarction 02/23/2013  . TIA (transient ischemic attack) 02/22/2013  . Right arm weakness 02/22/2013  . Hypertension 02/22/2013  . Esophageal dysmotility 02/24/2011  . Esophageal web 02/24/2011  . Mouth pain 02/24/2011  . Acute renal failure ([place]) 02/16/2011  . Hypokalemia 02/13/2011  . Protein-calorie malnutrition, severe ([place]) 02/13/2011  . Dysphagia 02/10/2011  . Anemia 02/10/2011  . Weakness generalized 02/10/2011  . S/P hip replacement right 02/10/2011    Past Surgical History:  Procedure Laterality Date  . CAROTID ENDARTERECTOMY Left 03-11-13   cea  . carpel tunnel Right   .  ENDARTERECTOMY Left 03/11/2013   Procedure: ENDARTERECTOMY CAROTID with Vascu-Guard Bovine Patch.;  Surgeon: Conrad Orono, MD;  Location: [facility];  Service: Vascular;  Laterality: Left;  . EP IMPLANTABLE DEVICE N/A 06/14/2016   Procedure: Loop Recorder Insertion;  Surgeon: Will Meredith Leeds, MD;  Location: [facility];  Service: Cardiovascular;  Laterality: N/A;  . ESOPHAGOGASTRODUODENOSCOPY N/A 09/14/2017   Procedure: ESOPHAGOGASTRODUODENOSCOPY (EGD);  Surgeon: Rogene Houston,  MD;  [facility];  Service: Endoscopy;  Laterality: N/A;  . JOINT REPLACEMENT Right    hip  . JOINT REPLACEMENT Left    knee  . KNEE ARTHROSCOPY Right 2010  . LOOP RECORDER REMOVAL N/A 08/23/2016   Procedure: Loop Recorder Removal;  Surgeon: Will Meredith Leeds, MD;  Location: [facility];  Service: Cardiovascular;  Laterality: N/A;  . REPLACEMENT TOTAL KNEE Left 2006   left knee  . TOTAL HIP ARTHROPLASTY Right august 6th 2012   right side   . TRANSURETHRAL RESECTION OF PROSTATE  1996        Home Medications    Prior to Admission medications   Medication Sig Start Date End Date Taking? Authorizing Provider  acetaminophen (TYLENOL) 500 MG tablet Take 1,000 mg by mouth daily as needed for headache.    Yes [provider]  atorvastatin (LIPITOR) 20 MG tablet TAKE 1 TABLET BY MOUTH ONCE DAILY AT  6  PM 10/04/17  Yes Lendon Colonel, NP  levothyroxine (SYNTHROID, LEVOTHROID) 88 MCG tablet Take 88 mcg by mouth daily.  05/29/16  Yes [provider]  losartan (COZAAR) 25 MG tablet Take 25 mg by mouth daily.   Yes [provider]  Multiple Vitamins-Minerals (CENTRUM ADULTS PO) Take 1 tablet by mouth daily.   Yes [provider]  Nutritional Supplements (ENSURE COMPLETE PO) Take 237 mLs by mouth See admin instructions. Drink one can (237 mls) by mouth every other night   Yes [provider]  Psyllium (METAMUCIL PO) Take 2 scoop by mouth at bedtime. Mix in liquid and drink   Yes [provider]  senna (SENOKOT) 8.6 MG TABS tablet Take 1 tablet by mouth at bedtime.   Yes [provider]  XARELTO 15 MG TABS tablet TAKE 1 TABLET BY MOUTH DAILY WITH SUPPER 10/10/17  Yes Strader, [place] M, PA-C  docusate sodium (COLACE) 100 MG capsule Take 100 mg by mouth at bedtime.    [provider]  losartan-hydrochlorothiazide (HYZAAR) 50-12.5 MG tablet Take 0.5 tablets by mouth daily. Takes 1/2 tablet daily Patient not  taking: Reported on 02/10/2018 09/12/17   Erma Heritage, PA-C    Family History Family History  Problem Relation Age of Onset  . Stroke Father   . Heart attack Father   . Hypertension Mother   . Hypertension Unknown   . Diabetes Unknown     Social History Social History   Tobacco Use  . Smoking status: Never Smoker  . Smokeless tobacco: Never Used  Substance Use Topics  . Alcohol use: No    Alcohol/week: 0.0 standard drinks  . Drug use: No     Allergies   Sulfa antibiotics and Milk-related compounds   Review of Systems Review of Systems  Constitutional: Negative for appetite change and fatigue.  HENT: Negative for congestion, ear discharge and sinus pressure.   Eyes: Negative for discharge.  Respiratory: Negative for cough.   Cardiovascular: Negative for chest pain.  Gastrointestinal: Negative for abdominal pain and diarrhea.  Genitourinary: Negative for  frequency and hematuria.  Musculoskeletal: Negative for back pain.  Skin: Negative for rash.  Neurological: Positive for dizziness. Negative for seizures and headaches.  Psychiatric/Behavioral: Negative for hallucinations.     Physical Exam Updated Vital Signs BP 125/72   Pulse 79   Temp 97.8 F (36.6 C) (Oral)   Resp 17   Ht 5\' 10"  (1.778 m)   Wt 56.7 kg   SpO2 98%   BMI 17.94 kg/m   Physical Exam  Constitutional: He is oriented to person, place, and time. He appears well-developed.  HENT:  Head: Normocephalic.  Eyes: Conjunctivae and EOM are normal. No scleral icterus.  Neck: Neck supple. No thyromegaly present.  Cardiovascular: Regular rhythm. Exam reveals no gallop and no friction rub.  No murmur heard. Irregular heart rate  Pulmonary/Chest: No stridor. He has no wheezes. He has no rales. He exhibits no tenderness.  Abdominal: He exhibits no distension. There is no tenderness. There is no rebound.  Musculoskeletal: Normal range of motion. He exhibits no edema.  Lymphadenopathy:    He has  no cervical adenopathy.  Neurological: He is oriented to person, place, and time. He exhibits normal muscle tone. Coordination normal.  Skin: No rash noted. No erythema.  Psychiatric: He has a normal mood and affect. His behavior is normal.     ED Treatments / Results  Labs (all labs ordered are listed, but only abnormal results are displayed) Labs Reviewed  BASIC METABOLIC PANEL - Abnormal; Notable for the following components:      Result Value   GFR calc non Af Amer 51 (*)    GFR calc Af Amer 60 (*)    All other components within normal limits  CBC - Abnormal; Notable for the following components:   RBC 3.95 (*)    Hemoglobin 11.8 (*)    HCT 36.3 (*)    All other components within normal limits  TROPONIN I  HEPATIC FUNCTION PANEL  DIFFERENTIAL  URINALYSIS, ROUTINE W REFLEX MICROSCOPIC  CBG MONITORING, ED    EKG EKG Interpretation  Date/Time:  Saturday February 10 2018 11:04:47 EDT Ventricular Rate:  56 PR Interval:    QRS Duration: 102 QT Interval:  415 QTC Calculation: 401 R Axis:   -28 Text Interpretation:  Atrial fibrillation Borderline left axis deviation Probable anteroseptal infarct, old Minimal ST depression, lateral leads Confirmed by Milton Ferguson [phone]) on 02/10/2018 2:17:09 PM   Radiology Dg Chest 2 View  Result Date: 02/10/2018 CLINICAL DATA:  Weakness EXAM: CHEST - 2 VIEW COMPARISON:  09/13/2017 FINDINGS: Mild bilateral lower lobe scarring. No focal consolidation. No pleural effusion or pneumothorax. The heart is normal in size. Degenerative changes of the visualized thoracolumbar spine. IMPRESSION: No evidence of acute cardiopulmonary disease. Electronically Signed   By: Julian Hy M.D.   On: 02/10/2018 11:44   Ct Head Wo Contrast  Result Date: 02/10/2018 CLINICAL DATA:  Shortness of breath, headache and dizziness for 2 days. No known injury. EXAM: CT HEAD WITHOUT CONTRAST TECHNIQUE: Contiguous axial images were obtained from the base of the  skull through the vertex without intravenous contrast. COMPARISON:  Head CT dated 09/14/2017.  MRI brain dated 06/12/2016. FINDINGS: Brain: Again noted is generalized age related parenchymal volume loss with commensurate dilatation of the ventricles and sulci. Chronic small vessel ischemic changes again noted within the bilateral periventricular and subcortical white matter regions. No mass, hemorrhage, edema or other evidence of acute parenchymal abnormality. No extra-axial hemorrhage. Vascular: Chronic calcified atherosclerotic changes of the large vessels  at the skull base. No unexpected hyperdense vessel. Skull: Normal. Negative for fracture or focal lesion. Sinuses/Orbits: No acute finding. Other: None. IMPRESSION: 1. No acute findings.  No intracranial mass, hemorrhage or edema. 2. Chronic small vessel ischemic changes in the white matter. Electronically Signed   By: Franki Cabot M.D.   On: 02/10/2018 11:52    Procedures Procedures (including critical care time)  Medications Ordered in ED Medications  sodium chloride 0.9 % bolus 500 mL (0 mLs Intravenous Stopped 02/10/18 1213)     Initial Impression / Assessment and Plan / ED Course  I have reviewed the triage vital signs and the nursing notes.  Pertinent labs & imaging results that were available during my care of the patient were reviewed by me and considered in my medical decision making (see chart for details).     Labs show mild anemia.  EKG shows atrial fib.   patient states he does not have a history of irregular heartbeat.  Patient's rate is controlled and he is on Xarelto.  He will stop his blood pressure medicine and follow-up with his PCP this week  Final Clinical Impressions(s) / ED Diagnoses   Final diagnoses:  Dizziness    ED Discharge Orders    None       Milton Ferguson, MD 02/10/18 1430

## 2018-02-10 NOTE — ED Notes (Signed)
Meal provided 

## 2018-02-10 NOTE — ED Notes (Signed)
Pt attempted a urine sample at this time with no success. Pt states he will let me know when he can.

## 2018-02-10 NOTE — Discharge Instructions (Addendum)
Stop your blood pressure medicine until you see your doctor next week.  Record your blood pressure daily

## 2018-02-10 NOTE — ED Notes (Signed)
DR Z in to discuss findings

## 2018-02-10 NOTE — ED Notes (Signed)
Pt sent from Dr Juel Burrow office after having BP checked and finding low  Recent BP med change  Pt has living will that states he does not want resuscitation per his MPOA at bedside Requested copy of MPOA should we not have copy  Dr Earnest Conroy has seen  Pt reports has given a UA at Dr Juel Burrow office and cannot urinate at this time

## 2018-02-10 NOTE — ED Triage Notes (Signed)
Patient sent to ER from Dr Juel Burrow office. Patient seen at office for shortness of breath, headache, and dizziness x2 days. Per Dr Juel Burrow Office patient hypotensive with blood pressure of 88/60.

## 2018-02-10 NOTE — ED Notes (Signed)
Labels scanned but would not print

## 2018-04-03 ENCOUNTER — Encounter: Payer: Self-pay | Admitting: Cardiovascular Disease

## 2018-04-03 ENCOUNTER — Ambulatory Visit (INDEPENDENT_AMBULATORY_CARE_PROVIDER_SITE_OTHER): Payer: Medicare Other | Admitting: Cardiovascular Disease

## 2018-04-03 VITALS — BP 118/72 | HR 71 | Ht 70.0 in | Wt 125.0 lb

## 2018-04-03 DIAGNOSIS — I10 Essential (primary) hypertension: Secondary | ICD-10-CM | POA: Diagnosis not present

## 2018-04-03 DIAGNOSIS — Z8673 Personal history of transient ischemic attack (TIA), and cerebral infarction without residual deficits: Secondary | ICD-10-CM | POA: Diagnosis not present

## 2018-04-03 DIAGNOSIS — I712 Thoracic aortic aneurysm, without rupture, unspecified: Secondary | ICD-10-CM

## 2018-04-03 DIAGNOSIS — Z9889 Other specified postprocedural states: Secondary | ICD-10-CM | POA: Diagnosis not present

## 2018-04-03 DIAGNOSIS — I714 Abdominal aortic aneurysm, without rupture, unspecified: Secondary | ICD-10-CM

## 2018-04-03 DIAGNOSIS — I480 Paroxysmal atrial fibrillation: Secondary | ICD-10-CM | POA: Diagnosis not present

## 2018-04-03 DIAGNOSIS — E785 Hyperlipidemia, unspecified: Secondary | ICD-10-CM

## 2018-04-03 NOTE — Patient Instructions (Addendum)
Medication Instructions:  Your physician recommends that you continue on your current medications as directed. Please refer to the Current Medication list given to you today.   If you need a refill on your cardiac medications before your next appointment, please call your pharmacy.   Lab work: none If you have labs (blood work) drawn today and your tests are completely normal, you will receive your results only by: [name] MyChart Message (if you have MyChart) OR . A paper copy in the mail If you have any lab test that is abnormal or we need to change your treatment, we will call you to review the results.  Testing/Procedures: None  Follow-Up: At [facility], you and your health needs are our priority.  As part of our continuing mission to provide you with exceptional heart care, we have created designated Provider Care Teams.  These Care Teams include your primary Cardiologist (physician) and Advanced Practice Providers (APPs -  Physician Assistants and Nurse Practitioners) who all work together to provide you with the care you need, when you need it. You will need a follow up appointment in 6 months.  Please call our office 2 months in advance to schedule this appointment.  You may see Dr.Koneswaran or one of the following Advanced Practice Providers on your designated Care Team:   [place], PA-C ([place]) . Ermalinda Barrios, PA-C ([place])  Any Other Special Instructions Will Be Listed Below (If Applicable). None

## 2018-04-03 NOTE — Progress Notes (Signed)
SUBJECTIVE: The patient presents for follow-up of paroxysmal atrial fibrillation.  He also has a history of thoracic aortic aneurysm, abdominal aortic aneurysm, and prior TIA/CVA.  Most recent echocardiogram in December 2018 demonstrated normal left ventricular systolic function with moderate LVH, LVEF 50 to 55%.  There was mild mitral and tricuspid regurgitation.  He is doing fairly well overall.  He denies chest pain and palpitations.  He has occasional shortness of breath.  His primary complaints relate to arthritis of the right knee.  He walks for 2 miles approximately 5 days/week.  He said he really enjoys walking.      Review of Systems: As per "subjective", otherwise negative.  Allergies  Allergen Reactions  . Sulfa Antibiotics Rash  . Milk-Related Compounds Other (See Comments)    Upset stomach    Current Outpatient Medications  Medication Sig Dispense Refill  . acetaminophen (TYLENOL) 500 MG tablet Take 1,000 mg by mouth daily as needed for headache.     [name] atorvastatin (LIPITOR) 20 MG tablet TAKE 1 TABLET BY MOUTH ONCE DAILY AT  6  PM 90 tablet 3  . docusate sodium (COLACE) 100 MG capsule Take 100 mg by mouth at bedtime.    . ferrous sulfate 325 (65 FE) MG EC tablet Take 325 mg by mouth 3 (three) times daily with meals.    [name] levothyroxine (SYNTHROID, LEVOTHROID) 88 MCG tablet Take 88 mcg by mouth daily.     . Multiple Vitamins-Minerals (CENTRUM ADULTS PO) Take 1 tablet by mouth daily.    . Nutritional Supplements (ENSURE COMPLETE PO) Take 237 mLs by mouth See admin instructions. Drink one can (237 mls) by mouth every other night    . Psyllium (METAMUCIL PO) Take 2 scoop by mouth at bedtime. Mix in liquid and drink    . senna (SENOKOT) 8.6 MG TABS tablet Take 1 tablet by mouth at bedtime.    Alveda Reasons 15 MG TABS tablet TAKE 1 TABLET BY MOUTH DAILY WITH SUPPER 30 tablet 11   No current facility-administered medications for this visit.     Past Medical History:    Diagnosis Date  . Anemia   . Arthritis   . BPH (benign prostatic hyperplasia)   . Carotid disease, bilateral ([place])    a. s/p L CEA in 2014. b. Duplex 05/2016: Right 40-59% ICA stenosis; Left 1-39% ICA stenosis  . CKD (chronic kidney disease), stage III ([place])   . COPD (chronic obstructive pulmonary disease) ([place])   . Diverticular disease 02/14/2011   problems swallowing  . DVT (deep venous thrombosis) ([place])   . Esophageal dysmotility   . Esophageal web   . GERD (gastroesophageal reflux disease)   . Headache(784.0)   . History of blood transfusion   . Hyperlipidemia   . Hypertension   . Hypothyroidism   . Kidney stone   . Nephrolithiasis    lithotripsy 2006  . Non-ischemic cardiomyopathy ([place])    a. Prior EF 45%, normal nuc 2012. b. EF 50-55% in 2018.  [name] PAF (paroxysmal atrial fibrillation) ([place])    a. identified on monitor 06/2016.  [name] Pneumonia    hx of - hospitalized  . Stroke ([place]) 05/2016  . TIA (transient ischemic attack)     Past Surgical History:  Procedure Laterality Date  . CAROTID ENDARTERECTOMY Left 03-11-13   cea  . carpel tunnel Right   . ENDARTERECTOMY Left 03/11/2013   Procedure: ENDARTERECTOMY CAROTID with Vascu-Guard Bovine Patch.;  Surgeon: Conrad , MD;  Location: [facility];  Service: Vascular;  Laterality: Left;  . EP IMPLANTABLE DEVICE N/A 06/14/2016   Procedure: Loop Recorder Insertion;  Surgeon: Will Meredith Leeds, MD;  Location: [facility];  Service: Cardiovascular;  Laterality: N/A;  . ESOPHAGOGASTRODUODENOSCOPY N/A 09/14/2017   Procedure: ESOPHAGOGASTRODUODENOSCOPY (EGD);  Surgeon: Rogene [place], MD;  Location: [facility];  Service: Endoscopy;  Laterality: N/A;  . JOINT REPLACEMENT Right    hip  . JOINT REPLACEMENT Left    knee  . KNEE ARTHROSCOPY Right 2010  . LOOP RECORDER REMOVAL N/A 08/23/2016   Procedure: Loop Recorder Removal;  Surgeon: Will Meredith Leeds, MD;  Location: [facility];  Service: Cardiovascular;   Laterality: N/A;  . REPLACEMENT TOTAL KNEE Left 2006   left knee  . TOTAL HIP ARTHROPLASTY Right august 6th 2012   right side   . TRANSURETHRAL RESECTION OF PROSTATE  1996    Social History   Socioeconomic History  . Marital status: Married    Spouse name: Not on file  . Number of children: 1  . Years of education: Not on file  . Highest education level: Not on file  Occupational History  . Occupation: Retired [employer]  . Financial resource strain: Not on file  . Food insecurity:    Worry: Not on file    Inability: Not on file  . Transportation needs:    Medical: Not on file    Non-medical: Not on file  Tobacco Use  . Smoking status: Never Smoker  . Smokeless tobacco: Never Used  Substance and Sexual Activity  . Alcohol use: No    Alcohol/week: 0.0 standard drinks  . Drug use: No  . Sexual activity: Not on file  Lifestyle  . Physical activity:    Days per week: Not on file    Minutes per session: Not on file  . Stress: Not on file  Relationships  . Social connections:    Talks on phone: Not on file    Gets together: Not on file    Attends religious service: Not on file    Active member of club or organization: Not on file    Attends meetings of clubs or organizations: Not on file    Relationship status: Not on file  . Intimate partner violence:    Fear of current or ex partner: Not on file    Emotionally abused: Not on file    Physically abused: Not on file    Forced sexual activity: Not on file  Other Topics Concern  . Not on file  Social History Narrative  . Not on file     Vitals:   04/03/18 0959  BP: 118/72  Pulse: 71  SpO2: 97%  Weight: 125 lb (56.7 kg)  Height: 5\' 10"  (1.778 m)    Wt Readings from Last 3 Encounters:  04/03/18 125 lb (56.7 kg)  02/10/18 125 lb (56.7 kg)  11/03/17 131 lb 6.4 oz (59.6 kg)     PHYSICAL EXAM General: NAD HEENT: Normal. Neck: No JVD, no thyromegaly. Lungs: Clear to auscultation  bilaterally with normal respiratory effort. CV: Regular rate and rhythm, normal S1/S2, no S3/S4, no murmur. No pretibial or periankle edema.  No carotid bruit.   Abdomen: Soft, nontender, no distention.  Neurologic: Alert and oriented.  Psych: Normal affect. Skin: Normal. Musculoskeletal: No gross deformities.    ECG: Reviewed above under Subjective   Labs: Lab Results  Component Value Date/Time   K  3.7 02/10/2018 11:19 AM   BUN 21 02/10/2018 11:19 AM   CREATININE 1.20 02/10/2018 11:19 AM   ALT 14 02/10/2018 11:20 AM   TSH 4.360 02/10/2011 03:41 PM   HGB 11.8 (L) 02/10/2018 11:19 AM     Lipids: Lab Results  Component Value Date/Time   LDLCALC 47 05/19/2017 07:45 AM   CHOL 87 05/19/2017 07:45 AM   TRIG 32 05/19/2017 07:45 AM   HDL 34 (L) 05/19/2017 07:45 AM       ASSESSMENT AND PLAN:  1.  Paroxysmal atrial fibrillation: Symptomatically stable overall.  Currently in a regular rhythm.  Not on any AV nodal blocking agents.  He remains on Xarelto for anticoagulation.  2.  Hypertension: Blood pressure is normal.  No changes to therapy.  3.  Hyperlipidemia: He remains on atorvastatin 20 mg daily.  4.  Carotid artery stenosis: Status post left carotid endarterectomy in 2014.  Followed by vascular surgery.  5.  Thoracic aortic aneurysm/abdominal aortic aneurysm: CTA in December 2018 showed a 4.2 cm ascending thoracic aortic aneurysm.  He was also noted to have a 3.4 cm infrarenal abdominal aortic aneurysm by CT of the abdomen in April 2019.   Disposition: Follow up 6 months   Kate Sable, M.D., F.A.C.C.

## 2018-06-10 ENCOUNTER — Encounter (HOSPITAL_COMMUNITY): Payer: Self-pay | Admitting: Emergency Medicine

## 2018-06-10 ENCOUNTER — Emergency Department (HOSPITAL_COMMUNITY): Payer: Medicare HMO

## 2018-06-10 ENCOUNTER — Emergency Department (HOSPITAL_COMMUNITY)
Admission: EM | Admit: 2018-06-10 | Discharge: 2018-06-10 | Disposition: A | Discharge status: Home / Self Care | Payer: Medicare HMO | Attending: Emergency Medicine | Admitting: Emergency Medicine

## 2018-06-10 DIAGNOSIS — N183 Chronic kidney disease, stage 3 (moderate): Secondary | ICD-10-CM | POA: Insufficient documentation

## 2018-06-10 DIAGNOSIS — Z79899 Other long term (current) drug therapy: Secondary | ICD-10-CM | POA: Diagnosis not present

## 2018-06-10 DIAGNOSIS — L539 Erythematous condition, unspecified: Secondary | ICD-10-CM | POA: Diagnosis present

## 2018-06-10 DIAGNOSIS — J449 Chronic obstructive pulmonary disease, unspecified: Secondary | ICD-10-CM | POA: Insufficient documentation

## 2018-06-10 DIAGNOSIS — L909 Atrophic disorder of skin, unspecified: Secondary | ICD-10-CM | POA: Diagnosis not present

## 2018-06-10 DIAGNOSIS — Z471 Aftercare following joint replacement surgery: Secondary | ICD-10-CM | POA: Diagnosis not present

## 2018-06-10 DIAGNOSIS — R238 Other skin changes: Secondary | ICD-10-CM

## 2018-06-10 DIAGNOSIS — L98491 Non-pressure chronic ulcer of skin of other sites limited to breakdown of skin: Secondary | ICD-10-CM | POA: Diagnosis not present

## 2018-06-10 DIAGNOSIS — I129 Hypertensive chronic kidney disease with stage 1 through stage 4 chronic kidney disease, or unspecified chronic kidney disease: Secondary | ICD-10-CM | POA: Insufficient documentation

## 2018-06-10 DIAGNOSIS — Z96641 Presence of right artificial hip joint: Secondary | ICD-10-CM | POA: Diagnosis not present

## 2018-06-10 MED ORDER — MUPIROCIN 2 % EX OINT: TOPICAL_OINTMENT | CUTANEOUS | 0 refills | Status: AC

## 2018-06-10 NOTE — Discharge Instructions (Addendum)
Leave the dressing in place for 2 days.  Avoid sleeping on your right side.  You may remove the dressing after 2 days and apply the ointment as directed.  Follow-up with your primary doctor for recheck latera this week.  Return here for any worsening symptoms such as increased pain, swelling, and fever.

## 2018-06-10 NOTE — ED Triage Notes (Signed)
Pt states he has a chronic red area on his right hip where his hip replacement was.  States this has not healed since his surgery in 2013 and the area has been "biopsied".  Took 2 Tylenol this morning with pain relief.

## 2018-06-13 NOTE — ED Provider Notes (Signed)
[facility] EMERGENCY DEPARTMENT Provider Note   CSN: [id number] Arrival date & time: 06/10/18  1194     History   Chief Complaint Chief Complaint  Patient presents with  . Wound Check    HPI Jorge Nixon is a 85 y.o. male.  HPI   Jorge Nixon is a 85 y.o. male who presents to the Emergency Department complaining of chronic, persistent redness and tenderness to a focal area of the lateral right hip.  He states he has these symptoms for "years" he has been evaluated for this by his PCP, orthopedic provider and dermatology all without clear etiology.  Had a biopsy of the area that he states "didn't show anything"  Symptoms began after having a total hip replacement in 2013.  Pain to the area is associated with palpation and when he sleeps on his right side.  He is concerned the skin is infected.  He takes tylenol with temporary resolution of his pain and he covers the area with a pad.  He denies recent injury, fever, chills, abdominal or groin pain, drainage from the site, and pain radiating into his leg.      Past Medical History:  Diagnosis Date  . Anemia   . Arthritis   . BPH (benign prostatic hyperplasia)   . Carotid disease, bilateral ([place])    a. s/p L CEA in 2014. b. Duplex 05/2016: Right 40-59% ICA stenosis; Left 1-39% ICA stenosis  . CKD (chronic kidney disease), stage III ([place])   . COPD (chronic obstructive pulmonary disease) ([place])   . Diverticular disease 02/14/2011   problems swallowing  . DVT (deep venous thrombosis) ([place])   . Esophageal dysmotility   . Esophageal web   . GERD (gastroesophageal reflux disease)   . Headache(784.0)   . History of blood transfusion   . Hyperlipidemia   . Hypertension   . Hypothyroidism   . Kidney stone   . Nephrolithiasis    lithotripsy 2006  . Non-ischemic cardiomyopathy ([place])    a. Prior EF 45%, normal nuc 2012. b. EF 50-55% in 2018.  [name] PAF (paroxysmal atrial fibrillation) ([place])    a. identified on monitor 06/2016.  [name]  Pneumonia    hx of - hospitalized  . Stroke ([place]) 05/2016  . TIA (transient ischemic attack)     Patient Active Problem List   Diagnosis Date Noted  . Hematemesis 09/13/2017  . Hypothyroidism 09/13/2017  . Acute blood loss anemia 09/13/2017  . Abdominal pain, epigastric 09/13/2017  . Hematochezia   . Constipation   . Left-sided chest pain 05/18/2017  . PAF (paroxysmal atrial fibrillation) ([place]) 05/18/2017  . CKD (chronic kidney disease) stage 3, GFR 30-59 ml/min ([facility]) 05/18/2017  . Thoracic aortic aneurysm without rupture ([place]) 05/18/2017  . AAA (abdominal aortic aneurysm) without rupture ([place]) 05/18/2017  . Renal artery stenosis ([place]) 05/18/2017  . Thrombocytopenia ([place]) 05/18/2017  . Carotid stenosis R 06/14/2016  . History of CEA (carotid endarterectomy)   . History of stroke   . Hyperlipidemia   . CVA (cerebral vascular accident) ([place]) - sm R cortical cryptogenic infarct 06/11/2016  . Aftercare following surgery of the circulatory system, [place] 10/11/2013  . CVA (cerebral infarction) 02/23/2013  . Occlusion and stenosis of carotid artery with cerebral infarction 02/23/2013  . TIA (transient ischemic attack) 02/22/2013  . Right arm weakness 02/22/2013  . Hypertension 02/22/2013  . Esophageal dysmotility 02/24/2011  . Esophageal web 02/24/2011  . Mouth pain 02/24/2011  . Acute renal failure ([place]) 02/16/2011  .  Hypokalemia 02/13/2011  . Protein-calorie malnutrition, severe ([place]) 02/13/2011  . Dysphagia 02/10/2011  . Anemia 02/10/2011  . Weakness generalized 02/10/2011  . S/P hip replacement right 02/10/2011    Past Surgical History:  Procedure Laterality Date  . CAROTID ENDARTERECTOMY Left 03-11-13   cea  . carpel tunnel Right   . ENDARTERECTOMY Left 03/11/2013   Procedure: ENDARTERECTOMY CAROTID with Vascu-Guard Bovine Patch.;  Surgeon: Conrad Daviess, MD;  Location: [facility];  Service: Vascular;  Laterality: Left;  . EP IMPLANTABLE DEVICE N/A 06/14/2016   Procedure:  Loop Recorder Insertion;  Surgeon: Will Meredith Leeds, MD;  Location: [facility];  Service: Cardiovascular;  Laterality: N/A;  . ESOPHAGOGASTRODUODENOSCOPY N/A 09/14/2017   Procedure: ESOPHAGOGASTRODUODENOSCOPY (EGD);  Surgeon: Rogene [place], MD;  [facility];  Service: Endoscopy;  Laterality: N/A;  . JOINT REPLACEMENT Right    hip  . JOINT REPLACEMENT Left    knee  . KNEE ARTHROSCOPY Right 2010  . LOOP RECORDER REMOVAL N/A 08/23/2016   Procedure: Loop Recorder Removal;  Surgeon: Will Meredith Leeds, MD;  Location: [facility];  Service: Cardiovascular;  Laterality: N/A;  . REPLACEMENT TOTAL KNEE Left 2006   left knee  . TOTAL HIP ARTHROPLASTY Right august 6th 2012   right side   . TRANSURETHRAL RESECTION OF PROSTATE  1996        Home Medications    Prior to Admission medications   Medication Sig Start Date End Date Taking? Authorizing Provider  acetaminophen (TYLENOL) 500 MG tablet Take 1,000 mg by mouth daily as needed for headache.    Yes [provider]  atorvastatin (LIPITOR) 20 MG tablet TAKE 1 TABLET BY MOUTH ONCE DAILY AT  6  PM 10/04/17  Yes Lendon Colonel, NP  docusate sodium (COLACE) 100 MG capsule Take 100 mg by mouth at bedtime.   Yes [provider]  levothyroxine (SYNTHROID, LEVOTHROID) 88 MCG tablet Take 88 mcg by mouth daily.  05/29/16  Yes [provider]  Multiple Vitamins-Minerals (CENTRUM ADULTS PO) Take 1 tablet by mouth daily.   Yes [provider]  Nutritional Supplements (ENSURE COMPLETE PO) Take 237 mLs by mouth See admin instructions. Drink one can (237 mls) by mouth every other night   Yes [provider]  Psyllium (METAMUCIL PO) Take 2 scoop by mouth at bedtime. Mix in liquid and drink   Yes [provider]  senna (SENOKOT) 8.6 MG TABS tablet Take 1 tablet by mouth at bedtime.   Yes [provider]  XARELTO 15 MG TABS tablet TAKE 1 TABLET BY MOUTH DAILY WITH  SUPPER 10/10/17  Yes Strader, [place] M, PA-C  mupirocin ointment (BACTROBAN) 2 % Apply a small amt of ointment to the affected area TID x 10 days 06/10/18   Kem Parkinson, PA-C    Family History Family History  Problem Relation Age of Onset  . Stroke Father   . Heart attack Father   . Hypertension Mother   . Hypertension Other   . Diabetes Other     Social History Social History   Tobacco Use  . Smoking status: Never Smoker  . Smokeless tobacco: Never Used  Substance Use Topics  . Alcohol use: No    Alcohol/week: 0.0 standard drinks  . Drug use: No     Allergies   Sulfa antibiotics and Milk-related compounds   Review of Systems Review of Systems  Constitutional: Negative for chills and fever.  Cardiovascular: Negative for chest pain.  Gastrointestinal: Negative for abdominal pain, nausea and vomiting.  Musculoskeletal: Negative for arthralgias and joint swelling.  Skin: Positive for color change (focal area of redness of the lateral right hip).       Abscess   Neurological: Negative for weakness and numbness.  Hematological: Negative for adenopathy.     Physical Exam Updated Vital Signs BP 126/78   Pulse (!) 59   Temp (!) 97.4 F (36.3 C) (Oral)   Resp 15   Ht 5\' 10"  (1.778 m)   Wt 60.3 kg   SpO2 100%   BMI 19.08 kg/m   Physical Exam Vitals signs and nursing note reviewed.  Constitutional:      General: He is not in acute distress.    Appearance: Normal appearance. He is well-developed. He is not ill-appearing.     Comments: Pt is frail and thin  HENT:     Head: Atraumatic.  Cardiovascular:     Rate and Rhythm: Normal rate and regular rhythm.     Heart sounds: Normal heart sounds. No murmur.  Pulmonary:     Effort: Pulmonary effort is normal. No respiratory distress.     Breath sounds: Normal breath sounds.  Abdominal:     General: There is no distension.     Palpations: Abdomen is soft. There is no mass.     Tenderness: There is no  abdominal tenderness.  Musculoskeletal: Normal range of motion.        General: No swelling or signs of injury.     Right lower leg: No edema.     Left lower leg: No edema.  Skin:    General: Skin is warm and dry.     Findings: Erythema present.     Comments: A 5 cm focal area of erythema to the lateral right hip over lying the area of his prosthetic hip.  No edema, fluctuance, drainage or excessive warm.    Neurological:     Mental Status: He is alert and oriented to person, place, and time.     Motor: No abnormal muscle tone.     Coordination: Coordination normal.      ED Treatments / Results  Labs (all labs ordered are listed, but only abnormal results are displayed) Labs Reviewed - No data to display  EKG None  Radiology Dg Hip Unilat W Or Wo Pelvis 2-3 Views Right  Result Date: 06/10/2018 CLINICAL DATA:  Chronic right hip pain. EXAM: DG HIP (WITH OR WITHOUT PELVIS) 2-3V RIGHT COMPARISON:  None. FINDINGS: Status post right total hip arthroplasty. The femoral and acetabular components appear to be well situated. Left hip is unremarkable. No fracture or dislocation is noted. Vascular calcifications are noted. IMPRESSION: Status post right total hip arthroplasty. No acute abnormality seen in the right hip. Electronically Signed   By: Marijo Conception, M.D.   On: 06/10/2018 11:54     Procedures Procedures (including critical care time)  Medications Ordered in ED Medications - No data to display   Initial Impression / Assessment and Plan / ED Course  I have reviewed the triage vital signs and the nursing notes.  Pertinent labs & imaging results that were available during my care of the patient were reviewed by me and considered in my medical decision making (see chart for details).     Pt with a focal area of erythema to lateral right hip overlying the upper portion of his hip prothesis.  His sx's has been present for "years" the area appears c/w  mild skin breakdown  without ulcer.  No abscess or concerning sx's for septic joint.  NV intact and he has full ROM of the joint.    Discussed with Dr. Roderic Palau.  Pt well appearing, non toxic.  XR reassuring.  He agrees to avoid pressure to area, padding dressing applied and advised to use a egg crate mattress.  Family requesting abx cream.  He agrees to close PCP f/u.  Return precautions discussed.   Final Clinical Impressions(s) / ED Diagnoses   Final diagnoses:  Skin breakdown    ED Discharge Orders         Ordered    mupirocin ointment (BACTROBAN) 2 %     06/10/18 1227           Kem Parkinson, PA-C 06/13/18 2205    Milton Ferguson, MD 06/15/18 2330

## 2018-06-25 DIAGNOSIS — H6122 Impacted cerumen, left ear: Secondary | ICD-10-CM | POA: Diagnosis not present

## 2018-06-25 DIAGNOSIS — J0100 Acute maxillary sinusitis, unspecified: Secondary | ICD-10-CM | POA: Diagnosis not present

## 2018-06-27 ENCOUNTER — Encounter (HOSPITAL_COMMUNITY): Payer: Self-pay | Admitting: Emergency Medicine

## 2018-06-27 ENCOUNTER — Emergency Department (HOSPITAL_COMMUNITY)
Admission: EM | Admit: 2018-06-27 | Discharge: 2018-06-27 | Disposition: A | Discharge status: Home / Self Care | Payer: Medicare HMO | Attending: Emergency Medicine | Admitting: Emergency Medicine

## 2018-06-27 ENCOUNTER — Other Ambulatory Visit: Payer: Self-pay

## 2018-06-27 DIAGNOSIS — N183 Chronic kidney disease, stage 3 (moderate): Secondary | ICD-10-CM | POA: Diagnosis not present

## 2018-06-27 DIAGNOSIS — R195 Other fecal abnormalities: Secondary | ICD-10-CM | POA: Diagnosis not present

## 2018-06-27 DIAGNOSIS — Z96641 Presence of right artificial hip joint: Secondary | ICD-10-CM | POA: Diagnosis not present

## 2018-06-27 DIAGNOSIS — J449 Chronic obstructive pulmonary disease, unspecified: Secondary | ICD-10-CM | POA: Diagnosis not present

## 2018-06-27 DIAGNOSIS — E039 Hypothyroidism, unspecified: Secondary | ICD-10-CM | POA: Diagnosis not present

## 2018-06-27 DIAGNOSIS — K921 Melena: Secondary | ICD-10-CM

## 2018-06-27 DIAGNOSIS — I129 Hypertensive chronic kidney disease with stage 1 through stage 4 chronic kidney disease, or unspecified chronic kidney disease: Secondary | ICD-10-CM | POA: Insufficient documentation

## 2018-06-27 DIAGNOSIS — R531 Weakness: Secondary | ICD-10-CM | POA: Diagnosis not present

## 2018-06-27 DIAGNOSIS — Z79899 Other long term (current) drug therapy: Secondary | ICD-10-CM | POA: Diagnosis not present

## 2018-06-27 DIAGNOSIS — Z96652 Presence of left artificial knee joint: Secondary | ICD-10-CM | POA: Insufficient documentation

## 2018-06-27 DIAGNOSIS — Z7901 Long term (current) use of anticoagulants: Secondary | ICD-10-CM | POA: Diagnosis not present

## 2018-06-27 LAB — CBC
HCT: 39.5 % (ref 39.0–52.0)
HEMOGLOBIN: 12 g/dL — AB (ref 13.0–17.0)
MCH: 27.9 pg (ref 26.0–34.0)
MCHC: 30.4 g/dL (ref 30.0–36.0)
MCV: 91.9 fL (ref 80.0–100.0)
Platelets: 111 10*3/uL — ABNORMAL LOW (ref 150–400)
RBC: 4.3 MIL/uL (ref 4.22–5.81)
RDW: 15.7 % — ABNORMAL HIGH (ref 11.5–15.5)
WBC: 5.4 10*3/uL (ref 4.0–10.5)
nRBC: 0 % (ref 0.0–0.2)

## 2018-06-27 LAB — COMPREHENSIVE METABOLIC PANEL
ALT: 16 U/L (ref 0–44)
AST: 31 U/L (ref 15–41)
Albumin: 4.4 g/dL (ref 3.5–5.0)
Alkaline Phosphatase: 56 U/L (ref 38–126)
Anion gap: 9 (ref 5–15)
BUN: 16 mg/dL (ref 8–23)
CO2: 27 mmol/L (ref 22–32)
Calcium: 8.9 mg/dL (ref 8.9–10.3)
Chloride: 100 mmol/L (ref 98–111)
Creatinine, Ser: 0.97 mg/dL (ref 0.61–1.24)
GFR calc Af Amer: >60 mL/min (ref >60)
GFR calc non Af Amer: >60 mL/min (ref >60)
Glucose, Bld: 99 mg/dL (ref 70–99)
POTASSIUM: 3.9 mmol/L (ref 3.5–5.1)
Sodium: 136 mmol/L (ref 135–145)
Total Bilirubin: 0.5 mg/dL (ref 0.3–1.2)
Total Protein: 7.6 g/dL (ref 6.5–8.1)

## 2018-06-27 LAB — TYPE AND SCREEN
ABO/RH(D): A NEG
ANTIBODY SCREEN: NEGATIVE

## 2018-06-27 MED ORDER — PANTOPRAZOLE SODIUM 20 MG PO TBEC: 20.0000 mg | DELAYED_RELEASE_TABLET | Freq: Every day | ORAL | 0 refills | Status: AC

## 2018-06-27 NOTE — ED Provider Notes (Signed)
[facility] EMERGENCY DEPARTMENT Provider Note   CSN: [id number] Arrival date & time: 06/27/18  1118     History   Chief Complaint Chief Complaint  Patient presents with  . Melena    HPI Jorge Nixon is a 85 y.o. male.  Patient states he had some black stools for the last few days.  He has no other complaints except for mild abdominal discomfort  The history is provided by the patient. No language interpreter was used.  Weakness  Severity:  Mild Onset quality:  Sudden Timing:  Constant Progression:  Resolved Chronicity:  New Relieved by:  Nothing Worsened by:  Nothing Ineffective treatments:  None tried Associated symptoms: abdominal pain   Associated symptoms: no chest pain, no cough, no diarrhea, no frequency, no headaches and no seizures     Past Medical History:  Diagnosis Date  . Anemia   . Arthritis   . BPH (benign prostatic hyperplasia)   . Carotid disease, bilateral ([place])    a. s/p L CEA in 2014. b. Duplex 05/2016: Right 40-59% ICA stenosis; Left 1-39% ICA stenosis  . CKD (chronic kidney disease), stage III ([place])   . COPD (chronic obstructive pulmonary disease) ([place])   . Diverticular disease 02/14/2011   problems swallowing  . DVT (deep venous thrombosis) ([place])   . Esophageal dysmotility   . Esophageal web   . GERD (gastroesophageal reflux disease)   . Headache(784.0)   . History of blood transfusion   . Hyperlipidemia   . Hypertension   . Hypothyroidism   . Kidney stone   . Nephrolithiasis    lithotripsy 2006  . Non-ischemic cardiomyopathy ([place])    a. Prior EF 45%, normal nuc 2012. b. EF 50-55% in 2018.  [name] PAF (paroxysmal atrial fibrillation) ([place])    a. identified on monitor 06/2016.  [name] Pneumonia    hx of - hospitalized  . Stroke ([place]) 05/2016  . TIA (transient ischemic attack)     Patient Active Problem List   Diagnosis Date Noted  . Hematemesis 09/13/2017  . Hypothyroidism 09/13/2017  . Acute blood loss anemia 09/13/2017  . Abdominal  pain, epigastric 09/13/2017  . Hematochezia   . Constipation   . Left-sided chest pain 05/18/2017  . PAF (paroxysmal atrial fibrillation) ([place]) 05/18/2017  . CKD (chronic kidney disease) stage 3, GFR 30-59 ml/min (HCC) 05/18/2017  . Thoracic aortic aneurysm without rupture ([place]) 05/18/2017  . AAA (abdominal aortic aneurysm) without rupture ([place]) 05/18/2017  . Renal artery stenosis ([place]) 05/18/2017  . Thrombocytopenia ([place]) 05/18/2017  . Carotid stenosis R 06/14/2016  . History of CEA (carotid endarterectomy)   . History of stroke   . Hyperlipidemia   . CVA (cerebral vascular accident) ([place]) - sm R cortical cryptogenic infarct 06/11/2016  . Aftercare following surgery of the circulatory system, [place] 10/11/2013  . CVA (cerebral infarction) 02/23/2013  . Occlusion and stenosis of carotid artery with cerebral infarction 02/23/2013  . TIA (transient ischemic attack) 02/22/2013  . Right arm weakness 02/22/2013  . Hypertension 02/22/2013  . Esophageal dysmotility 02/24/2011  . Esophageal web 02/24/2011  . Mouth pain 02/24/2011  . Acute renal failure ([place]) 02/16/2011  . Hypokalemia 02/13/2011  . Protein-calorie malnutrition, severe ([place]) 02/13/2011  . Dysphagia 02/10/2011  . Anemia 02/10/2011  . Weakness generalized 02/10/2011  . S/P hip replacement right 02/10/2011    Past Surgical History:  Procedure Laterality Date  . CAROTID ENDARTERECTOMY Left 03-11-13   cea  . carpel tunnel Right   . ENDARTERECTOMY  Left 03/11/2013   Procedure: ENDARTERECTOMY CAROTID with Vascu-Guard Bovine Patch.;  Surgeon: Conrad Kaktovik, MD;  Location: [facility];  Service: Vascular;  Laterality: Left;  . EP IMPLANTABLE DEVICE N/A 06/14/2016   Procedure: Loop Recorder Insertion;  Surgeon: Will Meredith Leeds, MD;  Location: [facility];  Service: Cardiovascular;  Laterality: N/A;  . ESOPHAGOGASTRODUODENOSCOPY N/A 09/14/2017   Procedure: ESOPHAGOGASTRODUODENOSCOPY (EGD);  Surgeon: Rogene Houston, MD;   [facility];  Service: Endoscopy;  Laterality: N/A;  . JOINT REPLACEMENT Right    hip  . JOINT REPLACEMENT Left    knee  . KNEE ARTHROSCOPY Right 2010  . LOOP RECORDER REMOVAL N/A 08/23/2016   Procedure: Loop Recorder Removal;  Surgeon: Will Meredith Leeds, MD;  Location: [facility];  Service: Cardiovascular;  Laterality: N/A;  . REPLACEMENT TOTAL KNEE Left 2006   left knee  . TOTAL HIP ARTHROPLASTY Right august 6th 2012   right side   . TRANSURETHRAL RESECTION OF PROSTATE  1996        Home Medications    Prior to Admission medications   Medication Sig Start Date End Date Taking? Authorizing Provider  acetaminophen (TYLENOL) 500 MG tablet Take 500-1,000 mg by mouth daily as needed for headache.    Yes [provider]  atorvastatin (LIPITOR) 20 MG tablet TAKE 1 TABLET BY MOUTH ONCE DAILY AT  6  PM Patient taking differently: Take 20 mg by mouth every evening.  10/04/17  Yes Lendon Colonel, NP  docusate sodium (COLACE) 100 MG capsule Take 100 mg by mouth at bedtime.   Yes [provider]  levothyroxine (SYNTHROID, LEVOTHROID) 88 MCG tablet Take 88 mcg by mouth daily.  05/29/16  Yes [provider]  losartan (COZAAR) 25 MG tablet Take 25 mg by mouth daily.   Yes [provider]  Multiple Vitamins-Minerals (CENTRUM ADULTS PO) Take 1 tablet by mouth daily.   Yes [provider]  Nutritional Supplements (ENSURE COMPLETE PO) Take 237 mLs by mouth See admin instructions. Drink one can (237 mls) by mouth every other night   Yes [provider]  Psyllium (METAMUCIL PO) Take 2 scoop by mouth at bedtime. Mix in liquid and drink   Yes [provider]  Sennosides (EQ LAXATIVE) 15 MG CHEW Chew 1 tablet by mouth every evening.   Yes [provider]  XARELTO 15 MG TABS tablet TAKE 1 TABLET BY MOUTH DAILY WITH SUPPER Patient taking differently: Take 15 mg by mouth daily with supper.  10/10/17  Yes Strader,  [place] M, PA-C  mupirocin ointment (BACTROBAN) 2 % Apply a small amt of ointment to the affected area TID x 10 days Patient not taking: Reported on 06/27/2018 06/10/18   Triplett, Tammy, PA-C  pantoprazole (PROTONIX) 20 MG tablet Take 1 tablet (20 mg total) by mouth daily. 06/27/18   Milton Ferguson, MD    Family History Family History  Problem Relation Age of Onset  . Stroke Father   . Heart attack Father   . Hypertension Mother   . Hypertension Other   . Diabetes Other     Social History Social History   Tobacco Use  . Smoking status: Never Smoker  . Smokeless tobacco: Never Used  Substance Use Topics  . Alcohol use: No    Alcohol/week: 0.0 standard drinks  . Drug use: No     Allergies   Sulfa antibiotics and Milk-related compounds   Review of Systems Review of Systems  Constitutional:  Negative for appetite change and fatigue.  HENT: Negative for congestion, ear discharge and sinus pressure.   Eyes: Negative for discharge.  Respiratory: Negative for cough.   Cardiovascular: Negative for chest pain.  Gastrointestinal: Positive for abdominal pain. Negative for diarrhea.       Black stools  Genitourinary: Negative for frequency and hematuria.  Musculoskeletal: Negative for back pain.  Skin: Negative for rash.  Neurological: Positive for weakness. Negative for seizures and headaches.  Psychiatric/Behavioral: Negative for hallucinations.     Physical Exam Updated Vital Signs BP (!) 152/93 (BP Location: Left Arm)   Pulse 71   Temp (!) 97.5 F (36.4 C) (Oral)   Resp 16   Ht 5\' 10"  (1.778 m)   Wt 59.4 kg   SpO2 96%   BMI 18.80 kg/m   Physical Exam Vitals signs and nursing note reviewed.  Constitutional:      Appearance: He is well-developed.  HENT:     Head: Normocephalic.     Nose: Nose normal.  Eyes:     General: No scleral icterus.    Conjunctiva/sclera: Conjunctivae normal.  Neck:     Musculoskeletal: Neck supple.     Thyroid: No thyromegaly.    Cardiovascular:     Rate and Rhythm: Normal rate and regular rhythm.     Heart sounds: No murmur. No friction rub. No gallop.   Pulmonary:     Breath sounds: No stridor. No wheezing or rales.  Chest:     Chest wall: No tenderness.  Abdominal:     General: There is no distension.     Tenderness: There is no abdominal tenderness. There is no rebound.  Genitourinary:    Comments: Rectal exam shows minimal brown stool heme-negative Musculoskeletal: Normal range of motion.  Lymphadenopathy:     Cervical: No cervical adenopathy.  Skin:    Findings: No erythema or rash.  Neurological:     Mental Status: He is oriented to person, place, and time.     Motor: No abnormal muscle tone.     Coordination: Coordination normal.  Psychiatric:        Behavior: Behavior normal.      ED Treatments / Results  Labs (all labs ordered are listed, but only abnormal results are displayed) Labs Reviewed  CBC - Abnormal; Notable for the following components:      Result Value   Hemoglobin 12.0 (*)    RDW 15.7 (*)    Platelets 111 (*)    All other components within normal limits  COMPREHENSIVE METABOLIC PANEL  POC OCCULT BLOOD, ED  TYPE AND SCREEN    EKG None  Radiology No results found.  Procedures Procedures (including critical care time)  Medications Ordered in ED Medications - No data to display   Initial Impression / Assessment and Plan / ED Course  I have reviewed the triage vital signs and the nursing notes.  Pertinent labs & imaging results that were available during my care of the patient were reviewed by me and considered in my medical decision making (see chart for details).     Labs unremarkable.  Patient is not orthostatic.  We will hold his Xarelto tonight and start him on some Protonix and he is to follow-up with his PCP in the next day or 2  Final Clinical Impressions(s) / ED Diagnoses   Final diagnoses:  Black stools    ED Discharge Orders         Ordered     pantoprazole ([place])  20 MG tablet  Daily     06/27/18 1704           Milton Ferguson, MD 06/27/18 1708

## 2018-06-27 NOTE — ED Triage Notes (Signed)
Patient complaining of black stools and lower abdominal pain x 3 days.

## 2018-06-27 NOTE — ED Notes (Signed)
Patient denies any dizziness upon sitting or standing.  

## 2018-06-27 NOTE — Discharge Instructions (Signed)
Do not take your Xarelto tonight.  But you can start back tomorrow.  Follow-up with your family doctor either tomorrow or the next day

## 2018-07-02 DIAGNOSIS — E039 Hypothyroidism, unspecified: Secondary | ICD-10-CM | POA: Diagnosis not present

## 2018-07-02 DIAGNOSIS — D509 Iron deficiency anemia, unspecified: Secondary | ICD-10-CM | POA: Diagnosis not present

## 2018-07-02 DIAGNOSIS — E785 Hyperlipidemia, unspecified: Secondary | ICD-10-CM | POA: Diagnosis not present

## 2018-07-02 DIAGNOSIS — E611 Iron deficiency: Secondary | ICD-10-CM | POA: Diagnosis not present

## 2018-07-02 DIAGNOSIS — I10 Essential (primary) hypertension: Secondary | ICD-10-CM | POA: Diagnosis not present

## 2018-07-02 DIAGNOSIS — N401 Enlarged prostate with lower urinary tract symptoms: Secondary | ICD-10-CM | POA: Diagnosis not present

## 2018-07-02 DIAGNOSIS — R531 Weakness: Secondary | ICD-10-CM | POA: Diagnosis not present

## 2018-07-02 DIAGNOSIS — Z681 Body mass index (BMI) 19 or less, adult: Secondary | ICD-10-CM | POA: Diagnosis not present

## 2018-07-02 DIAGNOSIS — E782 Mixed hyperlipidemia: Secondary | ICD-10-CM | POA: Diagnosis not present

## 2018-07-07 ENCOUNTER — Other Ambulatory Visit: Payer: Self-pay | Admitting: Adult Health

## 2018-07-09 DIAGNOSIS — I129 Hypertensive chronic kidney disease with stage 1 through stage 4 chronic kidney disease, or unspecified chronic kidney disease: Secondary | ICD-10-CM | POA: Diagnosis not present

## 2018-07-09 DIAGNOSIS — E46 Unspecified protein-calorie malnutrition: Secondary | ICD-10-CM | POA: Diagnosis not present

## 2018-07-09 DIAGNOSIS — I4891 Unspecified atrial fibrillation: Secondary | ICD-10-CM | POA: Diagnosis not present

## 2018-07-09 DIAGNOSIS — R339 Retention of urine, unspecified: Secondary | ICD-10-CM | POA: Diagnosis not present

## 2018-07-09 DIAGNOSIS — E039 Hypothyroidism, unspecified: Secondary | ICD-10-CM | POA: Diagnosis not present

## 2018-07-09 DIAGNOSIS — E782 Mixed hyperlipidemia: Secondary | ICD-10-CM | POA: Diagnosis not present

## 2018-07-09 DIAGNOSIS — N183 Chronic kidney disease, stage 3 (moderate): Secondary | ICD-10-CM | POA: Diagnosis not present

## 2018-07-09 DIAGNOSIS — D509 Iron deficiency anemia, unspecified: Secondary | ICD-10-CM | POA: Diagnosis not present

## 2018-07-09 DIAGNOSIS — I639 Cerebral infarction, unspecified: Secondary | ICD-10-CM | POA: Diagnosis not present

## 2018-07-12 ENCOUNTER — Telehealth: Payer: Self-pay | Admitting: Student

## 2018-07-12 NOTE — Telephone Encounter (Signed)
Pt cannot afford Xarelto and would like to know if there is something else he can take, please call his step daughter Eliseo Squires @ [phone]

## 2018-07-12 NOTE — Telephone Encounter (Signed)
Spoke with daughter. Suggested that pt apply for patient assistance. Daughter will bring proof of income to office to apply.

## 2018-07-30 DIAGNOSIS — R1032 Left lower quadrant pain: Secondary | ICD-10-CM | POA: Diagnosis not present

## 2018-07-30 DIAGNOSIS — K625 Hemorrhage of anus and rectum: Secondary | ICD-10-CM | POA: Diagnosis not present

## 2018-08-06 ENCOUNTER — Telehealth: Payer: Self-pay | Admitting: Cardiology

## 2018-08-06 NOTE — Telephone Encounter (Signed)
Calling to check status of Patient Assistance for Eliquis/tg

## 2018-08-07 NOTE — Telephone Encounter (Signed)
Called [employer] regarding the status of patient application. Needed to verify number of people in household and if they file taxes. Daughter notified of   Progress.

## 2018-08-08 ENCOUNTER — Telehealth: Payer: Self-pay | Admitting: *Deleted

## 2018-08-08 NOTE — Telephone Encounter (Signed)
Daughter Eliseo Squires notified that patient has been approved for [employer] patient assistance foundation.

## 2018-08-16 ENCOUNTER — Telehealth: Payer: Self-pay | Admitting: Cardiology

## 2018-08-16 NOTE — Telephone Encounter (Signed)
Pt hasn't received his pharmacy card for his Xarelto and he is out, they are requesting samples.

## 2018-08-16 NOTE — Telephone Encounter (Signed)
Wife notified that samples are ready to be picked up

## 2018-08-17 ENCOUNTER — Other Ambulatory Visit: Payer: Self-pay

## 2018-08-17 ENCOUNTER — Ambulatory Visit (INDEPENDENT_AMBULATORY_CARE_PROVIDER_SITE_OTHER): Payer: 59 | Admitting: Urology

## 2018-08-17 DIAGNOSIS — R351 Nocturia: Secondary | ICD-10-CM | POA: Diagnosis not present

## 2018-08-17 DIAGNOSIS — N401 Enlarged prostate with lower urinary tract symptoms: Secondary | ICD-10-CM | POA: Diagnosis not present

## 2018-08-23 ENCOUNTER — Telehealth: Payer: Self-pay

## 2018-08-23 NOTE — Telephone Encounter (Signed)
   Cardiac Questionnaire:    Since your last visit or hospitalization:    1. Have you been having new or worsening chest pain? no   2. Have you been having new or worsening shortness of breath? no 3. Have you been having new or worsening leg swelling, wt gain, or increase in abdominal girth (pants fitting more tightly)? no   4. Have you had any passing out spells? no         I spoke with patient.He is 85 yrs old, has no complaints right now.He wanted to reschedule apt rather than have tele visit or webex. He does not need any refills at this time.

## 2018-09-04 ENCOUNTER — Ambulatory Visit: Payer: 59 | Admitting: Cardiovascular Disease

## 2018-09-11 DIAGNOSIS — Z742 Need for assistance at home and no other household member able to render care: Secondary | ICD-10-CM | POA: Diagnosis not present

## 2018-09-11 DIAGNOSIS — E43 Unspecified severe protein-calorie malnutrition: Secondary | ICD-10-CM | POA: Diagnosis not present

## 2018-09-11 DIAGNOSIS — Z9181 History of falling: Secondary | ICD-10-CM | POA: Diagnosis not present

## 2018-09-11 DIAGNOSIS — R296 Repeated falls: Secondary | ICD-10-CM | POA: Diagnosis not present

## 2018-09-11 DIAGNOSIS — R531 Weakness: Secondary | ICD-10-CM | POA: Diagnosis not present

## 2018-09-11 DIAGNOSIS — Z7409 Other reduced mobility: Secondary | ICD-10-CM | POA: Diagnosis not present

## 2018-09-14 DIAGNOSIS — N183 Chronic kidney disease, stage 3 (moderate): Secondary | ICD-10-CM | POA: Diagnosis not present

## 2018-09-14 DIAGNOSIS — R2689 Other abnormalities of gait and mobility: Secondary | ICD-10-CM | POA: Diagnosis not present

## 2018-09-14 DIAGNOSIS — Z8673 Personal history of transient ischemic attack (TIA), and cerebral infarction without residual deficits: Secondary | ICD-10-CM | POA: Diagnosis not present

## 2018-09-14 DIAGNOSIS — I129 Hypertensive chronic kidney disease with stage 1 through stage 4 chronic kidney disease, or unspecified chronic kidney disease: Secondary | ICD-10-CM | POA: Diagnosis not present

## 2018-09-14 DIAGNOSIS — I4891 Unspecified atrial fibrillation: Secondary | ICD-10-CM | POA: Diagnosis not present

## 2018-09-14 DIAGNOSIS — D509 Iron deficiency anemia, unspecified: Secondary | ICD-10-CM | POA: Diagnosis not present

## 2018-09-14 DIAGNOSIS — E43 Unspecified severe protein-calorie malnutrition: Secondary | ICD-10-CM | POA: Diagnosis not present

## 2018-09-14 DIAGNOSIS — Z681 Body mass index (BMI) 19 or less, adult: Secondary | ICD-10-CM | POA: Diagnosis not present

## 2018-09-14 DIAGNOSIS — R296 Repeated falls: Secondary | ICD-10-CM | POA: Diagnosis not present

## 2018-09-14 DIAGNOSIS — M6281 Muscle weakness (generalized): Secondary | ICD-10-CM | POA: Diagnosis not present

## 2018-09-18 DIAGNOSIS — N183 Chronic kidney disease, stage 3 (moderate): Secondary | ICD-10-CM | POA: Diagnosis not present

## 2018-09-18 DIAGNOSIS — E43 Unspecified severe protein-calorie malnutrition: Secondary | ICD-10-CM | POA: Diagnosis not present

## 2018-09-18 DIAGNOSIS — I4891 Unspecified atrial fibrillation: Secondary | ICD-10-CM | POA: Diagnosis not present

## 2018-09-18 DIAGNOSIS — Z8673 Personal history of transient ischemic attack (TIA), and cerebral infarction without residual deficits: Secondary | ICD-10-CM | POA: Diagnosis not present

## 2018-09-18 DIAGNOSIS — R296 Repeated falls: Secondary | ICD-10-CM | POA: Diagnosis not present

## 2018-09-18 DIAGNOSIS — R2689 Other abnormalities of gait and mobility: Secondary | ICD-10-CM | POA: Diagnosis not present

## 2018-09-18 DIAGNOSIS — Z681 Body mass index (BMI) 19 or less, adult: Secondary | ICD-10-CM | POA: Diagnosis not present

## 2018-09-18 DIAGNOSIS — D509 Iron deficiency anemia, unspecified: Secondary | ICD-10-CM | POA: Diagnosis not present

## 2018-09-18 DIAGNOSIS — M6281 Muscle weakness (generalized): Secondary | ICD-10-CM | POA: Diagnosis not present

## 2018-09-18 DIAGNOSIS — I129 Hypertensive chronic kidney disease with stage 1 through stage 4 chronic kidney disease, or unspecified chronic kidney disease: Secondary | ICD-10-CM | POA: Diagnosis not present

## 2018-09-19 DIAGNOSIS — E43 Unspecified severe protein-calorie malnutrition: Secondary | ICD-10-CM | POA: Diagnosis not present

## 2018-09-19 DIAGNOSIS — Z8673 Personal history of transient ischemic attack (TIA), and cerebral infarction without residual deficits: Secondary | ICD-10-CM | POA: Diagnosis not present

## 2018-09-19 DIAGNOSIS — Z681 Body mass index (BMI) 19 or less, adult: Secondary | ICD-10-CM | POA: Diagnosis not present

## 2018-09-19 DIAGNOSIS — I4891 Unspecified atrial fibrillation: Secondary | ICD-10-CM | POA: Diagnosis not present

## 2018-09-19 DIAGNOSIS — M6281 Muscle weakness (generalized): Secondary | ICD-10-CM | POA: Diagnosis not present

## 2018-09-19 DIAGNOSIS — R2689 Other abnormalities of gait and mobility: Secondary | ICD-10-CM | POA: Diagnosis not present

## 2018-09-19 DIAGNOSIS — R296 Repeated falls: Secondary | ICD-10-CM | POA: Diagnosis not present

## 2018-09-19 DIAGNOSIS — D509 Iron deficiency anemia, unspecified: Secondary | ICD-10-CM | POA: Diagnosis not present

## 2018-09-19 DIAGNOSIS — N183 Chronic kidney disease, stage 3 (moderate): Secondary | ICD-10-CM | POA: Diagnosis not present

## 2018-09-19 DIAGNOSIS — I129 Hypertensive chronic kidney disease with stage 1 through stage 4 chronic kidney disease, or unspecified chronic kidney disease: Secondary | ICD-10-CM | POA: Diagnosis not present

## 2018-09-21 DIAGNOSIS — N183 Chronic kidney disease, stage 3 (moderate): Secondary | ICD-10-CM | POA: Diagnosis not present

## 2018-09-21 DIAGNOSIS — E43 Unspecified severe protein-calorie malnutrition: Secondary | ICD-10-CM | POA: Diagnosis not present

## 2018-09-21 DIAGNOSIS — I129 Hypertensive chronic kidney disease with stage 1 through stage 4 chronic kidney disease, or unspecified chronic kidney disease: Secondary | ICD-10-CM | POA: Diagnosis not present

## 2018-09-21 DIAGNOSIS — Z8673 Personal history of transient ischemic attack (TIA), and cerebral infarction without residual deficits: Secondary | ICD-10-CM | POA: Diagnosis not present

## 2018-09-21 DIAGNOSIS — I4891 Unspecified atrial fibrillation: Secondary | ICD-10-CM | POA: Diagnosis not present

## 2018-09-21 DIAGNOSIS — R2689 Other abnormalities of gait and mobility: Secondary | ICD-10-CM | POA: Diagnosis not present

## 2018-09-21 DIAGNOSIS — Z681 Body mass index (BMI) 19 or less, adult: Secondary | ICD-10-CM | POA: Diagnosis not present

## 2018-09-21 DIAGNOSIS — R296 Repeated falls: Secondary | ICD-10-CM | POA: Diagnosis not present

## 2018-09-21 DIAGNOSIS — M6281 Muscle weakness (generalized): Secondary | ICD-10-CM | POA: Diagnosis not present

## 2018-09-21 DIAGNOSIS — D509 Iron deficiency anemia, unspecified: Secondary | ICD-10-CM | POA: Diagnosis not present

## 2018-09-24 ENCOUNTER — Emergency Department (HOSPITAL_COMMUNITY): Payer: Medicare HMO

## 2018-09-24 ENCOUNTER — Encounter (HOSPITAL_COMMUNITY): Payer: Self-pay

## 2018-09-24 ENCOUNTER — Other Ambulatory Visit: Payer: Self-pay

## 2018-09-24 ENCOUNTER — Emergency Department (HOSPITAL_COMMUNITY)
Admission: EM | Admit: 2018-09-24 | Discharge: 2018-09-25 | Disposition: A | Discharge status: Home / Self Care | Payer: Medicare HMO | Attending: Emergency Medicine | Admitting: Emergency Medicine

## 2018-09-24 DIAGNOSIS — E039 Hypothyroidism, unspecified: Secondary | ICD-10-CM | POA: Insufficient documentation

## 2018-09-24 DIAGNOSIS — I4891 Unspecified atrial fibrillation: Secondary | ICD-10-CM | POA: Diagnosis not present

## 2018-09-24 DIAGNOSIS — Z7901 Long term (current) use of anticoagulants: Secondary | ICD-10-CM | POA: Diagnosis not present

## 2018-09-24 DIAGNOSIS — I129 Hypertensive chronic kidney disease with stage 1 through stage 4 chronic kidney disease, or unspecified chronic kidney disease: Secondary | ICD-10-CM | POA: Diagnosis not present

## 2018-09-24 DIAGNOSIS — M6281 Muscle weakness (generalized): Secondary | ICD-10-CM | POA: Diagnosis not present

## 2018-09-24 DIAGNOSIS — Z8673 Personal history of transient ischemic attack (TIA), and cerebral infarction without residual deficits: Secondary | ICD-10-CM | POA: Diagnosis not present

## 2018-09-24 DIAGNOSIS — N183 Chronic kidney disease, stage 3 (moderate): Secondary | ICD-10-CM | POA: Diagnosis not present

## 2018-09-24 DIAGNOSIS — R109 Unspecified abdominal pain: Secondary | ICD-10-CM | POA: Diagnosis not present

## 2018-09-24 DIAGNOSIS — Z79899 Other long term (current) drug therapy: Secondary | ICD-10-CM | POA: Insufficient documentation

## 2018-09-24 DIAGNOSIS — K5732 Diverticulitis of large intestine without perforation or abscess without bleeding: Secondary | ICD-10-CM | POA: Insufficient documentation

## 2018-09-24 DIAGNOSIS — R1030 Lower abdominal pain, unspecified: Secondary | ICD-10-CM | POA: Diagnosis present

## 2018-09-24 DIAGNOSIS — R2689 Other abnormalities of gait and mobility: Secondary | ICD-10-CM | POA: Diagnosis not present

## 2018-09-24 DIAGNOSIS — J449 Chronic obstructive pulmonary disease, unspecified: Secondary | ICD-10-CM | POA: Insufficient documentation

## 2018-09-24 DIAGNOSIS — D509 Iron deficiency anemia, unspecified: Secondary | ICD-10-CM | POA: Diagnosis not present

## 2018-09-24 DIAGNOSIS — E43 Unspecified severe protein-calorie malnutrition: Secondary | ICD-10-CM | POA: Diagnosis not present

## 2018-09-24 DIAGNOSIS — R296 Repeated falls: Secondary | ICD-10-CM | POA: Diagnosis not present

## 2018-09-24 DIAGNOSIS — R110 Nausea: Secondary | ICD-10-CM | POA: Diagnosis not present

## 2018-09-24 DIAGNOSIS — Z681 Body mass index (BMI) 19 or less, adult: Secondary | ICD-10-CM | POA: Diagnosis not present

## 2018-09-24 LAB — CBC WITH DIFFERENTIAL/PLATELET
Abs Immature Granulocytes: 0.02 10*3/uL (ref 0.00–0.07)
Basophils Absolute: 0 10*3/uL (ref 0.0–0.1)
Basophils Relative: 1 %
Eosinophils Absolute: 0.1 10*3/uL (ref 0.0–0.5)
Eosinophils Relative: 1 %
HCT: 35.8 % — ABNORMAL LOW (ref 39.0–52.0)
Hemoglobin: 11.5 g/dL — ABNORMAL LOW (ref 13.0–17.0)
Immature Granulocytes: 0 %
Lymphocytes Relative: 13 %
Lymphs Abs: 0.9 10*3/uL (ref 0.7–4.0)
MCH: 29.4 pg (ref 26.0–34.0)
MCHC: 32.1 g/dL (ref 30.0–36.0)
MCV: 91.6 fL (ref 80.0–100.0)
Monocytes Absolute: 0.5 10*3/uL (ref 0.1–1.0)
Monocytes Relative: 8 %
Neutro Abs: 5 10*3/uL (ref 1.7–7.7)
Neutrophils Relative %: 77 %
Platelets: 132 10*3/uL — ABNORMAL LOW (ref 150–400)
RBC: 3.91 MIL/uL — ABNORMAL LOW (ref 4.22–5.81)
RDW: 15 % (ref 11.5–15.5)
WBC: 6.5 10*3/uL (ref 4.0–10.5)
nRBC: 0 % (ref 0.0–0.2)

## 2018-09-24 LAB — COMPREHENSIVE METABOLIC PANEL
ALT: 13 U/L (ref 0–44)
AST: 27 U/L (ref 15–41)
Albumin: 4 g/dL (ref 3.5–5.0)
Alkaline Phosphatase: 66 U/L (ref 38–126)
Anion gap: 10 (ref 5–15)
BUN: 13 mg/dL (ref 8–23)
CO2: 26 mmol/L (ref 22–32)
Calcium: 9 mg/dL (ref 8.9–10.3)
Chloride: 101 mmol/L (ref 98–111)
Creatinine, Ser: 1.04 mg/dL (ref 0.61–1.24)
GFR calc Af Amer: >60 mL/min (ref >60)
GFR calc non Af Amer: >60 mL/min (ref >60)
Glucose, Bld: 98 mg/dL (ref 70–99)
Potassium: 4.1 mmol/L (ref 3.5–5.1)
Sodium: 137 mmol/L (ref 135–145)
Total Bilirubin: 0.8 mg/dL (ref 0.3–1.2)
Total Protein: 7.2 g/dL (ref 6.5–8.1)

## 2018-09-24 LAB — URINALYSIS, ROUTINE W REFLEX MICROSCOPIC
Bilirubin Urine: NEGATIVE
Glucose, UA: NEGATIVE mg/dL
Hgb urine dipstick: NEGATIVE
Ketones, ur: NEGATIVE mg/dL
Leukocytes,Ua: NEGATIVE
Nitrite: NEGATIVE
Protein, ur: NEGATIVE mg/dL
Specific Gravity, Urine: 1.009 (ref 1.005–1.030)
pH: 6 (ref 5.0–8.0)

## 2018-09-24 LAB — LIPASE, BLOOD: Lipase: 26 U/L (ref 11–51)

## 2018-09-24 MED ORDER — IOHEXOL 300 MG/ML  SOLN
100.0000 mL | Freq: Once | INTRAMUSCULAR | Status: AC | PRN
  Administered 2018-09-24: 23:00:00 100 mL via INTRAVENOUS

## 2018-09-24 NOTE — ED Provider Notes (Signed)
[facility] EMERGENCY DEPARTMENT Provider Note   CSN: [id number] Arrival date & time: 09/24/18  2109    History   Chief Complaint Chief Complaint  Patient presents with  . Abdominal Pain    HPI Jorge Nixon is a 85 y.o. male.     HPI   Jorge Nixon is a 85 y.o. male who presents to the Emergency Department complaining of left mid to lower abdominal pain.  Pain described as dull and constant.  Pain is associated with nausea.  No vomiting, flank pain or chest pain.  Pain began approximately 12 hours prior to arrival.  Patient states his appetite has been decreased, but pain was not worsened by food intake.  He denies shortness of breath, fever, dysuria and back pain.  He tried tylenol or pain, without improvement. He takes medication for hypertension, but unsure if he took it today.  He takes anti-coagulant due to hx of PAF and CVA   Past Medical History:  Diagnosis Date  . Anemia   . Arthritis   . BPH (benign prostatic hyperplasia)   . Carotid disease, bilateral ([place])    a. s/p L CEA in 2014. b. Duplex 05/2016: Right 40-59% ICA stenosis; Left 1-39% ICA stenosis  . CKD (chronic kidney disease), stage III ([place])   . COPD (chronic obstructive pulmonary disease) ([place])   . Diverticular disease 02/14/2011   problems swallowing  . DVT (deep venous thrombosis) ([place])   . Esophageal dysmotility   . Esophageal web   . GERD (gastroesophageal reflux disease)   . Headache(784.0)   . History of blood transfusion   . Hyperlipidemia   . Hypertension   . Hypothyroidism   . Kidney stone   . Nephrolithiasis    lithotripsy 2006  . Non-ischemic cardiomyopathy ([place])    a. Prior EF 45%, normal nuc 2012. b. EF 50-55% in 2018.  [name] PAF (paroxysmal atrial fibrillation) ([place])    a. identified on monitor 06/2016.  [name] Pneumonia    hx of - hospitalized  . Stroke ([place]) 05/2016  . TIA (transient ischemic attack)     Patient Active Problem List   Diagnosis Date Noted  . Hematemesis 09/13/2017  .  Hypothyroidism 09/13/2017  . Acute blood loss anemia 09/13/2017  . Abdominal pain, epigastric 09/13/2017  . Hematochezia   . Constipation   . Left-sided chest pain 05/18/2017  . PAF (paroxysmal atrial fibrillation) ([place]) 05/18/2017  . CKD (chronic kidney disease) stage 3, GFR 30-59 ml/min (HCC) 05/18/2017  . Thoracic aortic aneurysm without rupture ([place]) 05/18/2017  . AAA (abdominal aortic aneurysm) without rupture ([place]) 05/18/2017  . Renal artery stenosis ([place]) 05/18/2017  . Thrombocytopenia ([place]) 05/18/2017  . Carotid stenosis R 06/14/2016  . History of CEA (carotid endarterectomy)   . History of stroke   . Hyperlipidemia   . CVA (cerebral vascular accident) ([place]) - sm R cortical cryptogenic infarct 06/11/2016  . Aftercare following surgery of the circulatory system, [place] 10/11/2013  . CVA (cerebral infarction) 02/23/2013  . Occlusion and stenosis of carotid artery with cerebral infarction 02/23/2013  . TIA (transient ischemic attack) 02/22/2013  . Right arm weakness 02/22/2013  . Hypertension 02/22/2013  . Esophageal dysmotility 02/24/2011  . Esophageal web 02/24/2011  . Mouth pain 02/24/2011  . Acute renal failure ([place]) 02/16/2011  . Hypokalemia 02/13/2011  . Protein-calorie malnutrition, severe ([place]) 02/13/2011  . Dysphagia 02/10/2011  . Anemia 02/10/2011  . Weakness generalized 02/10/2011  . S/P hip replacement right 02/10/2011    Past  Surgical History:  Procedure Laterality Date  . CAROTID ENDARTERECTOMY Left 03-11-13   cea  . carpel tunnel Right   . ENDARTERECTOMY Left 03/11/2013   Procedure: ENDARTERECTOMY CAROTID with Vascu-Guard Bovine Patch.;  Surgeon: Conrad Fletcher, MD;  Location: [facility];  Service: Vascular;  Laterality: Left;  . EP IMPLANTABLE DEVICE N/A 06/14/2016   Procedure: Loop Recorder Insertion;  Surgeon: Will Meredith Leeds, MD;  Location: [facility];  Service: Cardiovascular;  Laterality: N/A;  . ESOPHAGOGASTRODUODENOSCOPY N/A 09/14/2017    Procedure: ESOPHAGOGASTRODUODENOSCOPY (EGD);  Surgeon: Rogene Houston, MD;  [facility];  Service: Endoscopy;  Laterality: N/A;  . JOINT REPLACEMENT Right    hip  . JOINT REPLACEMENT Left    knee  . KNEE ARTHROSCOPY Right 2010  . LOOP RECORDER REMOVAL N/A 08/23/2016   Procedure: Loop Recorder Removal;  Surgeon: Will Meredith Leeds, MD;  Location: [facility];  Service: Cardiovascular;  Laterality: N/A;  . REPLACEMENT TOTAL KNEE Left 2006   left knee  . TOTAL HIP ARTHROPLASTY Right august 6th 2012   right side   . TRANSURETHRAL RESECTION OF PROSTATE  1996        Home Medications    Prior to Admission medications   Medication Sig Start Date End Date Taking? Authorizing Provider  acetaminophen (TYLENOL) 500 MG tablet Take 500-1,000 mg by mouth daily as needed for headache.     [provider]  atorvastatin (LIPITOR) 20 MG tablet TAKE 1 TABLET BY MOUTH ONCE DAILY AT 6 PM. 07/09/18   Lendon Colonel, NP  docusate sodium (COLACE) 100 MG capsule Take 100 mg by mouth at bedtime.    [provider]  levothyroxine (SYNTHROID, LEVOTHROID) 88 MCG tablet Take 88 mcg by mouth daily.  05/29/16   [provider]  losartan (COZAAR) 25 MG tablet Take 25 mg by mouth daily.    [provider]  Multiple Vitamins-Minerals (CENTRUM ADULTS PO) Take 1 tablet by mouth daily.    [provider]  mupirocin ointment (BACTROBAN) 2 % Apply a small amt of ointment to the affected area TID x 10 days Patient not taking: Reported on 06/27/2018 06/10/18   Ivanell Deshotel, PA-C  Nutritional Supplements (ENSURE COMPLETE PO) Take 237 mLs by mouth See admin instructions. Drink one can (237 mls) by mouth every other night    [provider]  pantoprazole (PROTONIX) 20 MG tablet Take 1 tablet (20 mg total) by mouth daily. 06/27/18   Milton Ferguson, MD  Psyllium (METAMUCIL PO) Take 2 scoop by mouth at bedtime. Mix in liquid and drink    [provider]  Sennosides (EQ LAXATIVE) 15 MG CHEW Chew 1 tablet by mouth every evening.    [provider]  XARELTO 15 MG TABS tablet TAKE 1 TABLET BY MOUTH DAILY WITH SUPPER Patient taking differently: Take 15 mg by mouth daily with supper.  10/10/17   Erma Heritage, PA-C    Family History Family History  Problem Relation Age of Onset  . Stroke Father   . Heart attack Father   . Hypertension Mother   . Hypertension Other   . Diabetes Other     Social History Social History   Tobacco Use  . Smoking status: Never Smoker  . Smokeless tobacco: Never Used  Substance Use Topics  . Alcohol use: No    Alcohol/week: 0.0 standard drinks  . Drug use: No     Allergies   Sulfa antibiotics and Milk-related  compounds   Review of Systems Review of Systems  Constitutional: Negative for appetite change, chills and fever.  Respiratory: Negative for shortness of breath.   Cardiovascular: Negative for chest pain.  Gastrointestinal: Positive for abdominal pain and nausea. Negative for blood in stool, diarrhea and vomiting.  Genitourinary: Negative for decreased urine volume, difficulty urinating, dysuria, flank pain and penile pain.  Musculoskeletal: Negative for back pain.  Skin: Negative for color change and rash.  Neurological: Negative for dizziness, syncope, speech difficulty, weakness and numbness.  Hematological: Negative for adenopathy.     Physical Exam Updated Vital Signs BP (!) 170/100   Pulse 81   Temp 98.8 F (37.1 C) (Oral)   Resp 15   Ht 5\' 10"  (1.778 m)   Wt 54.4 kg   SpO2 98%   BMI 17.22 kg/m   Physical Exam Vitals signs and nursing note reviewed.  Constitutional:      Appearance: He is well-developed. He is not toxic-appearing.  HENT:     Mouth/Throat:     Mouth: Mucous membranes are moist.  Neck:     Musculoskeletal: Normal range of motion.  Cardiovascular:     Rate and Rhythm: Normal rate and regular rhythm.     Pulses: Normal  pulses.  Pulmonary:     Effort: Pulmonary effort is normal.     Breath sounds: Normal breath sounds. No wheezing.  Chest:     Chest wall: No tenderness.  Abdominal:     General: There is no distension.     Palpations: Abdomen is soft. There is no mass.     Tenderness: There is abdominal tenderness. There is no guarding or rebound.     Comments: Mild ttp of the left lower abdomen.  No guarding or rebound tenderness.    Musculoskeletal: Normal range of motion.  Skin:    General: Skin is warm.     Capillary Refill: Capillary refill takes less than 2 seconds.     Findings: No rash.  Neurological:     General: No focal deficit present.     Mental Status: He is alert.     Sensory: No sensory deficit.      ED Treatments / Results  Labs (all labs ordered are listed, but only abnormal results are displayed) Labs Reviewed  CBC WITH DIFFERENTIAL/PLATELET - Abnormal; Notable for the following components:      Result Value   RBC 3.91 (*)    Hemoglobin 11.5 (*)    HCT 35.8 (*)    Platelets 132 (*)    All other components within normal limits  COMPREHENSIVE METABOLIC PANEL  LIPASE, BLOOD  URINALYSIS, ROUTINE W REFLEX MICROSCOPIC    EKG EKG Interpretation  Date/Time:  Monday September 24 2018 21:56:32 EDT Ventricular Rate:  78 PR Interval:    QRS Duration: 111 QT Interval:  428 QTC Calculation: 488 R Axis:   -59 Text Interpretation:  Atrial fibrillation Left anterior fascicular block LVH with secondary repolarization abnormality Anterior infarct, old Baseline wander in lead(s) V4 Confirmed by Sherwood Gambler [phone]) on 09/24/2018 10:02:13 PM   Radiology Ct Abdomen Pelvis W Contrast  Result Date: 09/24/2018 CLINICAL DATA:  Left-sided abdominal pain beginning today. Abdominal aortic aneurysm. Diverticulosis. EXAM: CT ABDOMEN AND PELVIS WITH CONTRAST TECHNIQUE: Multidetector CT imaging of the abdomen and pelvis was performed using the standard protocol following bolus administration  of intravenous contrast. CONTRAST:  139mL OMNIPAQUE IOHEXOL 300 MG/ML  SOLN COMPARISON:  09/13/2017 FINDINGS: Lower Chest: Tiny right pleural effusion. Hepatobiliary: No  hepatic masses identified. Gallbladder is unremarkable. Pancreas:  No mass or inflammatory changes. Spleen: Within normal limits in size and appearance. Adrenals/Urinary Tract: No masses identified. Bilateral renal cysts show no significant change. No evidence of hydronephrosis. Stomach/Bowel: Diffuse colonic diverticulosis is seen. Mild diverticulitis is seen involving the proximal sigmoid colon. No evidence of abscess. No evidence of bowel obstruction. Vascular/Lymphatic: No pathologically enlarged lymph nodes. Infrarenal abdominal aortic aneurysm is again seen measuring 3.6 cm. No evidence of aneurysm leak or rupture. Reproductive:  No mass or other significant abnormality. Other: Right hip prosthesis results in significant beam hardening artifact through the inferior pelvis. Musculoskeletal:  No suspicious bone lesions identified. IMPRESSION: 1. Mild sigmoid diverticulitis. No evidence of abscess or other complication. 2. 3.6 cm infrarenal abdominal aortic aneurysm. Recommend followup by ultrasound in 2 years. This recommendation follows ACR consensus guidelines: White Paper of the ACR Incidental Findings Committee II on Vascular Findings. J Am Coll Radiol 2013; 10:789-794. Electronically Signed   By: Earle Gell M.D.   On: 09/24/2018 23:31    Procedures Procedures (including critical care time)  Medications Ordered in ED Medications  iohexol (OMNIPAQUE) 300 MG/ML solution 100 mL (100 mLs Intravenous Contrast Given 09/24/18 2249)     Initial Impression / Assessment and Plan / ED Course  I have reviewed the triage vital signs and the nursing notes.  Pertinent labs & imaging results that were available during my care of the patient were reviewed by me and considered in my medical decision making (see chart for details).         Pt well appearing, non-toxic, very alert .  Vitals reassuring.  No vomiting or fever.  Possible diverticulitis, will obtain labs and CT abd/pelvis.    CT scan of abd/pelvis confirms diverticulitis.  Labs reassuring.  Offered admission, Pt prefers to try outpatient therapy.  I feel this is reasonable.  Discussed with Dr. Regenia Skeeter.    I will start pt on augmentin.  He has tramadol at home.  He agrees to monitor BP closely.  Appears appropriate for d/c home and will closely f/u with PCP.  Strict return precautions also discussed.    Final Clinical Impressions(s) / ED Diagnoses   Final diagnoses:  Sigmoid diverticulitis    ED Discharge Orders    None       Kem Parkinson, PA-C 09/26/18 1818    Sherwood Gambler, MD 09/27/18 1512

## 2018-09-24 NOTE — ED Triage Notes (Signed)
Pt presents to ED with complaints of left sided abdominal pain which started about 1200 today. Pt denies N/V or diarrhea. Pt denies any urinary symptoms.

## 2018-09-24 NOTE — ED Notes (Signed)
Patient's son, Mekiah [place], called for update. Updated family member and gave room number to call patient. States he is going home and this nurse will call and update. Number to call is [phone].

## 2018-09-25 DIAGNOSIS — R2689 Other abnormalities of gait and mobility: Secondary | ICD-10-CM | POA: Diagnosis not present

## 2018-09-25 DIAGNOSIS — Z79899 Other long term (current) drug therapy: Secondary | ICD-10-CM | POA: Diagnosis not present

## 2018-09-25 DIAGNOSIS — R296 Repeated falls: Secondary | ICD-10-CM | POA: Diagnosis not present

## 2018-09-25 DIAGNOSIS — Z09 Encounter for follow-up examination after completed treatment for conditions other than malignant neoplasm: Secondary | ICD-10-CM | POA: Diagnosis not present

## 2018-09-25 DIAGNOSIS — K5732 Diverticulitis of large intestine without perforation or abscess without bleeding: Secondary | ICD-10-CM | POA: Diagnosis not present

## 2018-09-25 DIAGNOSIS — I129 Hypertensive chronic kidney disease with stage 1 through stage 4 chronic kidney disease, or unspecified chronic kidney disease: Secondary | ICD-10-CM | POA: Diagnosis not present

## 2018-09-25 DIAGNOSIS — R1084 Generalized abdominal pain: Secondary | ICD-10-CM | POA: Diagnosis not present

## 2018-09-25 DIAGNOSIS — N183 Chronic kidney disease, stage 3 (moderate): Secondary | ICD-10-CM | POA: Diagnosis not present

## 2018-09-25 DIAGNOSIS — Z8673 Personal history of transient ischemic attack (TIA), and cerebral infarction without residual deficits: Secondary | ICD-10-CM | POA: Diagnosis not present

## 2018-09-25 DIAGNOSIS — J449 Chronic obstructive pulmonary disease, unspecified: Secondary | ICD-10-CM | POA: Diagnosis not present

## 2018-09-25 DIAGNOSIS — Z7901 Long term (current) use of anticoagulants: Secondary | ICD-10-CM | POA: Diagnosis not present

## 2018-09-25 DIAGNOSIS — Z681 Body mass index (BMI) 19 or less, adult: Secondary | ICD-10-CM | POA: Diagnosis not present

## 2018-09-25 DIAGNOSIS — E039 Hypothyroidism, unspecified: Secondary | ICD-10-CM | POA: Diagnosis not present

## 2018-09-25 DIAGNOSIS — D509 Iron deficiency anemia, unspecified: Secondary | ICD-10-CM | POA: Diagnosis not present

## 2018-09-25 DIAGNOSIS — M6281 Muscle weakness (generalized): Secondary | ICD-10-CM | POA: Diagnosis not present

## 2018-09-25 DIAGNOSIS — I4891 Unspecified atrial fibrillation: Secondary | ICD-10-CM | POA: Diagnosis not present

## 2018-09-25 DIAGNOSIS — E43 Unspecified severe protein-calorie malnutrition: Secondary | ICD-10-CM | POA: Diagnosis not present

## 2018-09-25 MED ORDER — AMOXICILLIN-POT CLAVULANATE 875-125 MG PO TABS
1.0000 | ORAL_TABLET | Freq: Once | ORAL | Status: AC
  Administered 2018-09-25: 01:00:00 1 via ORAL
  Filled 2018-09-25: qty 1

## 2018-09-25 MED ORDER — AMOXICILLIN-POT CLAVULANATE 875-125 MG PO TABS: 1.0000 | ORAL_TABLET | Freq: Two times a day (BID) | ORAL | 0 refills | Status: DC

## 2018-09-25 NOTE — Discharge Instructions (Addendum)
Take the antibiotic as directed.  Follow-up with your primary doctor for recheck, return here for any worsening symptoms such as fever, vomiting or increasing pain

## 2018-09-26 DIAGNOSIS — Z681 Body mass index (BMI) 19 or less, adult: Secondary | ICD-10-CM | POA: Diagnosis not present

## 2018-09-26 DIAGNOSIS — R2689 Other abnormalities of gait and mobility: Secondary | ICD-10-CM | POA: Diagnosis not present

## 2018-09-26 DIAGNOSIS — R296 Repeated falls: Secondary | ICD-10-CM | POA: Diagnosis not present

## 2018-09-26 DIAGNOSIS — M6281 Muscle weakness (generalized): Secondary | ICD-10-CM | POA: Diagnosis not present

## 2018-09-26 DIAGNOSIS — E43 Unspecified severe protein-calorie malnutrition: Secondary | ICD-10-CM | POA: Diagnosis not present

## 2018-09-26 DIAGNOSIS — Z8673 Personal history of transient ischemic attack (TIA), and cerebral infarction without residual deficits: Secondary | ICD-10-CM | POA: Diagnosis not present

## 2018-09-26 DIAGNOSIS — N183 Chronic kidney disease, stage 3 (moderate): Secondary | ICD-10-CM | POA: Diagnosis not present

## 2018-09-26 DIAGNOSIS — I129 Hypertensive chronic kidney disease with stage 1 through stage 4 chronic kidney disease, or unspecified chronic kidney disease: Secondary | ICD-10-CM | POA: Diagnosis not present

## 2018-09-26 DIAGNOSIS — I4891 Unspecified atrial fibrillation: Secondary | ICD-10-CM | POA: Diagnosis not present

## 2018-09-26 DIAGNOSIS — D509 Iron deficiency anemia, unspecified: Secondary | ICD-10-CM | POA: Diagnosis not present

## 2018-09-27 DIAGNOSIS — R1084 Generalized abdominal pain: Secondary | ICD-10-CM | POA: Diagnosis not present

## 2018-09-27 DIAGNOSIS — M5416 Radiculopathy, lumbar region: Secondary | ICD-10-CM | POA: Diagnosis not present

## 2018-09-27 DIAGNOSIS — Z8673 Personal history of transient ischemic attack (TIA), and cerebral infarction without residual deficits: Secondary | ICD-10-CM | POA: Diagnosis not present

## 2018-09-27 DIAGNOSIS — Z713 Dietary counseling and surveillance: Secondary | ICD-10-CM | POA: Diagnosis not present

## 2018-09-27 DIAGNOSIS — R195 Other fecal abnormalities: Secondary | ICD-10-CM | POA: Diagnosis not present

## 2018-09-27 DIAGNOSIS — D509 Iron deficiency anemia, unspecified: Secondary | ICD-10-CM | POA: Diagnosis not present

## 2018-09-27 DIAGNOSIS — E039 Hypothyroidism, unspecified: Secondary | ICD-10-CM | POA: Diagnosis not present

## 2018-09-27 DIAGNOSIS — L0390 Cellulitis, unspecified: Secondary | ICD-10-CM | POA: Diagnosis not present

## 2018-09-27 DIAGNOSIS — I639 Cerebral infarction, unspecified: Secondary | ICD-10-CM | POA: Diagnosis not present

## 2018-09-27 DIAGNOSIS — Z23 Encounter for immunization: Secondary | ICD-10-CM | POA: Diagnosis not present

## 2018-09-27 DIAGNOSIS — I10 Essential (primary) hypertension: Secondary | ICD-10-CM | POA: Diagnosis not present

## 2018-09-27 DIAGNOSIS — K2901 Acute gastritis with bleeding: Secondary | ICD-10-CM | POA: Diagnosis not present

## 2018-09-27 DIAGNOSIS — K5732 Diverticulitis of large intestine without perforation or abscess without bleeding: Secondary | ICD-10-CM | POA: Diagnosis not present

## 2018-09-28 DIAGNOSIS — I129 Hypertensive chronic kidney disease with stage 1 through stage 4 chronic kidney disease, or unspecified chronic kidney disease: Secondary | ICD-10-CM | POA: Diagnosis not present

## 2018-09-28 DIAGNOSIS — I4891 Unspecified atrial fibrillation: Secondary | ICD-10-CM | POA: Diagnosis not present

## 2018-09-28 DIAGNOSIS — E43 Unspecified severe protein-calorie malnutrition: Secondary | ICD-10-CM | POA: Diagnosis not present

## 2018-09-28 DIAGNOSIS — Z681 Body mass index (BMI) 19 or less, adult: Secondary | ICD-10-CM | POA: Diagnosis not present

## 2018-09-28 DIAGNOSIS — N183 Chronic kidney disease, stage 3 (moderate): Secondary | ICD-10-CM | POA: Diagnosis not present

## 2018-09-28 DIAGNOSIS — R296 Repeated falls: Secondary | ICD-10-CM | POA: Diagnosis not present

## 2018-09-28 DIAGNOSIS — M6281 Muscle weakness (generalized): Secondary | ICD-10-CM | POA: Diagnosis not present

## 2018-09-28 DIAGNOSIS — Z8673 Personal history of transient ischemic attack (TIA), and cerebral infarction without residual deficits: Secondary | ICD-10-CM | POA: Diagnosis not present

## 2018-09-28 DIAGNOSIS — D509 Iron deficiency anemia, unspecified: Secondary | ICD-10-CM | POA: Diagnosis not present

## 2018-09-28 DIAGNOSIS — R2689 Other abnormalities of gait and mobility: Secondary | ICD-10-CM | POA: Diagnosis not present

## 2018-10-01 ENCOUNTER — Emergency Department (HOSPITAL_COMMUNITY): Payer: Medicare HMO

## 2018-10-01 ENCOUNTER — Emergency Department (HOSPITAL_COMMUNITY)
Admission: EM | Admit: 2018-10-01 | Discharge: 2018-10-01 | Disposition: A | Discharge status: Home / Self Care | Payer: Medicare HMO | Attending: Emergency Medicine | Admitting: Emergency Medicine

## 2018-10-01 ENCOUNTER — Encounter (HOSPITAL_COMMUNITY): Payer: Self-pay

## 2018-10-01 ENCOUNTER — Other Ambulatory Visit: Payer: Self-pay

## 2018-10-01 DIAGNOSIS — Z79899 Other long term (current) drug therapy: Secondary | ICD-10-CM | POA: Diagnosis not present

## 2018-10-01 DIAGNOSIS — E43 Unspecified severe protein-calorie malnutrition: Secondary | ICD-10-CM | POA: Diagnosis not present

## 2018-10-01 DIAGNOSIS — I4891 Unspecified atrial fibrillation: Secondary | ICD-10-CM | POA: Diagnosis not present

## 2018-10-01 DIAGNOSIS — N183 Chronic kidney disease, stage 3 (moderate): Secondary | ICD-10-CM | POA: Insufficient documentation

## 2018-10-01 DIAGNOSIS — Z8673 Personal history of transient ischemic attack (TIA), and cerebral infarction without residual deficits: Secondary | ICD-10-CM | POA: Diagnosis not present

## 2018-10-01 DIAGNOSIS — I4510 Unspecified right bundle-branch block: Secondary | ICD-10-CM | POA: Diagnosis not present

## 2018-10-01 DIAGNOSIS — I129 Hypertensive chronic kidney disease with stage 1 through stage 4 chronic kidney disease, or unspecified chronic kidney disease: Secondary | ICD-10-CM | POA: Insufficient documentation

## 2018-10-01 DIAGNOSIS — E039 Hypothyroidism, unspecified: Secondary | ICD-10-CM | POA: Insufficient documentation

## 2018-10-01 DIAGNOSIS — Z681 Body mass index (BMI) 19 or less, adult: Secondary | ICD-10-CM | POA: Diagnosis not present

## 2018-10-01 DIAGNOSIS — R1084 Generalized abdominal pain: Secondary | ICD-10-CM | POA: Insufficient documentation

## 2018-10-01 DIAGNOSIS — J449 Chronic obstructive pulmonary disease, unspecified: Secondary | ICD-10-CM | POA: Insufficient documentation

## 2018-10-01 DIAGNOSIS — R109 Unspecified abdominal pain: Secondary | ICD-10-CM | POA: Diagnosis not present

## 2018-10-01 DIAGNOSIS — Z7901 Long term (current) use of anticoagulants: Secondary | ICD-10-CM | POA: Diagnosis not present

## 2018-10-01 DIAGNOSIS — R0602 Shortness of breath: Secondary | ICD-10-CM | POA: Diagnosis not present

## 2018-10-01 DIAGNOSIS — R2689 Other abnormalities of gait and mobility: Secondary | ICD-10-CM | POA: Diagnosis not present

## 2018-10-01 DIAGNOSIS — R296 Repeated falls: Secondary | ICD-10-CM | POA: Diagnosis not present

## 2018-10-01 DIAGNOSIS — D509 Iron deficiency anemia, unspecified: Secondary | ICD-10-CM | POA: Diagnosis not present

## 2018-10-01 DIAGNOSIS — M6281 Muscle weakness (generalized): Secondary | ICD-10-CM | POA: Diagnosis not present

## 2018-10-01 LAB — CBC WITH DIFFERENTIAL/PLATELET
Abs Immature Granulocytes: 0.02 10*3/uL (ref 0.00–0.07)
Basophils Absolute: 0 10*3/uL (ref 0.0–0.1)
Basophils Relative: 1 %
Eosinophils Absolute: 0.1 10*3/uL (ref 0.0–0.5)
Eosinophils Relative: 1 %
HCT: 35 % — ABNORMAL LOW (ref 39.0–52.0)
Hemoglobin: 11.1 g/dL — ABNORMAL LOW (ref 13.0–17.0)
Immature Granulocytes: 0 %
Lymphocytes Relative: 20 %
Lymphs Abs: 1.1 10*3/uL (ref 0.7–4.0)
MCH: 29.4 pg (ref 26.0–34.0)
MCHC: 31.7 g/dL (ref 30.0–36.0)
MCV: 92.8 fL (ref 80.0–100.0)
Monocytes Absolute: 0.5 10*3/uL (ref 0.1–1.0)
Monocytes Relative: 9 %
Neutro Abs: 3.8 10*3/uL (ref 1.7–7.7)
Neutrophils Relative %: 69 %
Platelets: 137 10*3/uL — ABNORMAL LOW (ref 150–400)
RBC: 3.77 MIL/uL — ABNORMAL LOW (ref 4.22–5.81)
RDW: 14.6 % (ref 11.5–15.5)
WBC: 5.4 10*3/uL (ref 4.0–10.5)
nRBC: 0 % (ref 0.0–0.2)

## 2018-10-01 LAB — COMPREHENSIVE METABOLIC PANEL
ALT: 12 U/L (ref 0–44)
AST: 26 U/L (ref 15–41)
Albumin: 4 g/dL (ref 3.5–5.0)
Alkaline Phosphatase: 63 U/L (ref 38–126)
Anion gap: 10 (ref 5–15)
BUN: 14 mg/dL (ref 8–23)
CO2: 28 mmol/L (ref 22–32)
Calcium: 9 mg/dL (ref 8.9–10.3)
Chloride: 102 mmol/L (ref 98–111)
Creatinine, Ser: 1.08 mg/dL (ref 0.61–1.24)
GFR calc Af Amer: >60 mL/min (ref >60)
GFR calc non Af Amer: 60 mL/min — ABNORMAL LOW (ref >60)
Glucose, Bld: 97 mg/dL (ref 70–99)
Potassium: 3.9 mmol/L (ref 3.5–5.1)
Sodium: 140 mmol/L (ref 135–145)
Total Bilirubin: 0.5 mg/dL (ref 0.3–1.2)
Total Protein: 7.4 g/dL (ref 6.5–8.1)

## 2018-10-01 LAB — URINALYSIS, ROUTINE W REFLEX MICROSCOPIC
Bilirubin Urine: NEGATIVE
Glucose, UA: NEGATIVE mg/dL
Hgb urine dipstick: NEGATIVE
Ketones, ur: NEGATIVE mg/dL
Leukocytes,Ua: NEGATIVE
Nitrite: NEGATIVE
Protein, ur: NEGATIVE mg/dL
Specific Gravity, Urine: 1.01 (ref 1.005–1.030)
pH: 6 (ref 5.0–8.0)

## 2018-10-01 LAB — TROPONIN I: Troponin I: <0.03 ng/mL (ref <0.03)

## 2018-10-01 LAB — LIPASE, BLOOD: Lipase: 23 U/L (ref 11–51)

## 2018-10-01 MED ORDER — FENTANYL CITRATE (PF) 100 MCG/2ML IJ SOLN
50.0000 ug | Freq: Once | INTRAMUSCULAR | Status: AC
  Administered 2018-10-01: 16:00:00 50 ug via INTRAVENOUS
  Filled 2018-10-01: qty 2

## 2018-10-01 MED ORDER — HYDROCODONE-ACETAMINOPHEN 5-325 MG PO TABS: 1.0000 | ORAL_TABLET | Freq: Four times a day (QID) | ORAL | 0 refills | Status: AC | PRN

## 2018-10-01 MED ORDER — SODIUM CHLORIDE 0.9 % IV SOLN
INTRAVENOUS | Status: DC
  Administered 2018-10-01: 16:00:00 via INTRAVENOUS

## 2018-10-01 NOTE — ED Provider Notes (Signed)
[facility] EMERGENCY DEPARTMENT Provider Note   CSN: [id number] Arrival date & time: 10/01/18  1503    History   Chief Complaint Chief Complaint  Patient presents with  . Abdominal Pain    HPI Jorge Nixon is a 85 y.o. male.     HPI  The patient is a very pleasant 85 year old male with a history of chronic kidney disease as well as COPD, diverticular disease and in fact was recently started on Augmentin because of acute diverticulitis which was seen on September 25, 2018.  He declined admission at that time and wanted to go home.  The patient has a history of DVT as well as stroke, TIA and a history of being anticoagulated with Xarelto.  He presents to the hospital today with a complaint of shortness of breath as well as some ongoing pain.  The patient reports that he has also been short of breath ever since this started about a week ago.  His abdominal pain has not gotten any worse but states it has not gotten any better and he is tired of feeling poorly.  He denies having an objective fever, however he does state that there was a possible fever when he was at home as measured by his wife.  He does not know the number.  He reports that his shortness of breath has been mild, not associated with coughing, not associated with phlegm, not associated with swelling.  His abdominal pain is diffuse, there are multiple places where it hurts him when he points he points to his entire abdomen.  Past Medical History:  Diagnosis Date  . Anemia   . Arthritis   . BPH (benign prostatic hyperplasia)   . Carotid disease, bilateral ([place])    a. s/p L CEA in 2014. b. Duplex 05/2016: Right 40-59% ICA stenosis; Left 1-39% ICA stenosis  . CKD (chronic kidney disease), stage III ([place])   . COPD (chronic obstructive pulmonary disease) ([place])   . Diverticular disease 02/14/2011   problems swallowing  . DVT (deep venous thrombosis) ([place])   . Esophageal dysmotility   . Esophageal web   . GERD (gastroesophageal  reflux disease)   . Headache(784.0)   . History of blood transfusion   . Hyperlipidemia   . Hypertension   . Hypothyroidism   . Kidney stone   . Nephrolithiasis    lithotripsy 2006  . Non-ischemic cardiomyopathy ([place])    a. Prior EF 45%, normal nuc 2012. b. EF 50-55% in 2018.  [name] PAF (paroxysmal atrial fibrillation) ([place])    a. identified on monitor 06/2016.  [name] Pneumonia    hx of - hospitalized  . Stroke ([place]) 05/2016  . TIA (transient ischemic attack)     Patient Active Problem List   Diagnosis Date Noted  . Hematemesis 09/13/2017  . Hypothyroidism 09/13/2017  . Acute blood loss anemia 09/13/2017  . Abdominal pain, epigastric 09/13/2017  . Hematochezia   . Constipation   . Left-sided chest pain 05/18/2017  . PAF (paroxysmal atrial fibrillation) ([place]) 05/18/2017  . CKD (chronic kidney disease) stage 3, GFR 30-59 ml/min (HCC) 05/18/2017  . Thoracic aortic aneurysm without rupture ([place]) 05/18/2017  . AAA (abdominal aortic aneurysm) without rupture ([place]) 05/18/2017  . Renal artery stenosis ([place]) 05/18/2017  . Thrombocytopenia ([place]) 05/18/2017  . Carotid stenosis R 06/14/2016  . History of CEA (carotid endarterectomy)   . History of stroke   . Hyperlipidemia   . CVA (cerebral vascular accident) ([place]) - sm R cortical cryptogenic  infarct 06/11/2016  . Aftercare following surgery of the circulatory system, [place] 10/11/2013  . CVA (cerebral infarction) 02/23/2013  . Occlusion and stenosis of carotid artery with cerebral infarction 02/23/2013  . TIA (transient ischemic attack) 02/22/2013  . Right arm weakness 02/22/2013  . Hypertension 02/22/2013  . Esophageal dysmotility 02/24/2011  . Esophageal web 02/24/2011  . Mouth pain 02/24/2011  . Acute renal failure ([place]) 02/16/2011  . Hypokalemia 02/13/2011  . Protein-calorie malnutrition, severe ([place]) 02/13/2011  . Dysphagia 02/10/2011  . Anemia 02/10/2011  . Weakness generalized 02/10/2011  . S/P hip replacement right 02/10/2011     Past Surgical History:  Procedure Laterality Date  . CAROTID ENDARTERECTOMY Left 03-11-13   cea  . carpel tunnel Right   . ENDARTERECTOMY Left 03/11/2013   Procedure: ENDARTERECTOMY CAROTID with Vascu-Guard Bovine Patch.;  Surgeon: Conrad New [place], MD;  Location: [place];  Service: Vascular;  Laterality: Left;  . EP IMPLANTABLE DEVICE N/A 06/14/2016   Procedure: Loop Recorder Insertion;  Surgeon: Will Meredith Leeds, MD;  Location: [place] [facility];  Service: Cardiovascular;  Laterality: N/A;  . ESOPHAGOGASTRODUODENOSCOPY N/A 09/14/2017   Procedure: ESOPHAGOGASTRODUODENOSCOPY (EGD);  Surgeon: Rogene Houston, MD;  [facility];  Service: Endoscopy;  Laterality: N/A;  . JOINT REPLACEMENT Right    hip  . JOINT REPLACEMENT Left    knee  . KNEE ARTHROSCOPY Right 2010  . LOOP RECORDER REMOVAL N/A 08/23/2016   Procedure: Loop Recorder Removal;  Surgeon: Will Meredith Leeds, MD;  Location: [place] [facility];  Service: Cardiovascular;  Laterality: N/A;  . REPLACEMENT TOTAL KNEE Left 2006   left knee  . TOTAL HIP ARTHROPLASTY Right august 6th 2012   right side   . TRANSURETHRAL RESECTION OF PROSTATE  1996        Home Medications    Prior to Admission medications   Medication Sig Start Date End Date Taking? Authorizing Provider  acetaminophen (TYLENOL) 500 MG tablet Take 500-1,000 mg by mouth daily as needed for headache.    Yes [provider]  amoxicillin-clavulanate (AUGMENTIN) 875-125 MG tablet Take 1 tablet by mouth every 12 (twelve) hours. 09/25/18  Yes Triplett, Tammy, PA-C  atorvastatin (LIPITOR) 20 MG tablet TAKE 1 TABLET BY MOUTH ONCE DAILY AT 6 PM. Patient taking differently: Take 20 mg by mouth every evening.  07/09/18  Yes Lendon Colonel, NP  docusate sodium (COLACE) 100 MG capsule Take 100 mg by mouth at bedtime.   Yes [provider]  levothyroxine (SYNTHROID) 100 MCG tablet Take 100 mcg by mouth every morning.  05/29/16  Yes [provider]  losartan (COZAAR) 25 MG tablet Take 25 mg by mouth every morning.    Yes [provider]  Multiple Vitamins-Minerals (CENTRUM ADULTS PO) Take 1 tablet by mouth daily.   Yes [provider]  Nutritional Supplements (ENSURE COMPLETE PO) Take 237 mLs by mouth See admin instructions. Drink one can (237 mls) by mouth every other night   Yes [provider]  pantoprazole (PROTONIX) 20 MG tablet Take 1 tablet (20 mg total) by mouth daily. 06/27/18  Yes Milton Ferguson, MD  Psyllium (METAMUCIL PO) Take 2 scoop by mouth at bedtime. Mix in liquid and drink   Yes [provider]  Sennosides (EQ LAXATIVE) 15 MG CHEW Chew 1 tablet by mouth every evening.   Yes [provider]  silodosin (RAPAFLO) 8 MG CAPS capsule Take 8 mg by mouth every evening. 09/18/18  Yes [provider]  traMADol (ULTRAM) 50 MG tablet Take 50 mg by mouth every 6 (six) hours as needed for moderate pain or severe pain.   Yes [provider]  XARELTO 15 MG TABS tablet TAKE 1 TABLET BY MOUTH DAILY WITH SUPPER Patient taking differently: Take 15 mg by mouth daily with supper.  10/10/17  Yes Strader, Fransisco Hertz, PA-C  HYDROcodone-acetaminophen (NORCO/VICODIN) 5-325 MG tablet Take 1 tablet by mouth every 6 (six) hours as needed for up to 3 days. 10/01/18 10/04/18  Noemi Chapel, MD  mupirocin ointment Drue Stager) 2 % Apply a small amt of ointment to the affected area TID x 10 days Patient not taking: Reported on 06/27/2018 06/10/18   Kem Parkinson, PA-C    Family History Family History  Problem Relation Age of Onset  . Stroke Father   . Heart attack Father   . Hypertension Mother   . Hypertension Other   . Diabetes Other     Social History Social History   Tobacco Use  . Smoking status: Never Smoker  . Smokeless tobacco: Never Used  Substance Use Topics  . Alcohol use: No    Alcohol/week: 0.0 standard drinks  . Drug use: No     Allergies   Sulfa  antibiotics and Milk-related compounds   Review of Systems Review of Systems  All other systems reviewed and are negative.    Physical Exam Updated Vital Signs BP (!) 184/102   Pulse 73   Temp 97.9 F (36.6 C) (Oral)   Resp (!) 22   Ht 1.778 m (5\' 10" )   Wt 54.3 kg   SpO2 97%   BMI 17.18 kg/m   Physical Exam Vitals signs and nursing note reviewed.  Constitutional:      General: He is not in acute distress.    Appearance: He is well-developed.  HENT:     Head: Normocephalic and atraumatic.     Mouth/Throat:     Pharynx: No oropharyngeal exudate.     Comments: Mucous membranes are mildly dehydrated Eyes:     General: No scleral icterus.       Right eye: No discharge.        Left eye: No discharge.     Conjunctiva/sclera: Conjunctivae normal.     Pupils: Pupils are equal, round, and reactive to light.  Neck:     Musculoskeletal: Normal range of motion and neck supple.     Thyroid: No thyromegaly.     Vascular: No JVD.  Cardiovascular:     Rate and Rhythm: Normal rate and regular rhythm.     Heart sounds: Normal heart sounds. No murmur. No friction rub. No gallop.   Pulmonary:     Effort: Pulmonary effort is normal. No respiratory distress.     Breath sounds: Normal breath sounds. No wheezing or rales.  Abdominal:     General: Bowel sounds are normal. There is no distension.     Palpations: Abdomen is soft. There is no mass.     Tenderness: There is abdominal tenderness.     Comments: Diffuse abdominal tenderness, there is no guarding or peritoneal signs, no pulsating masses, tender both left lower, right lower as well as upper and epigastric  Musculoskeletal: Normal range of motion.        General: No tenderness.  Lymphadenopathy:     Cervical: No cervical adenopathy.  Skin:    General: Skin is warm and dry.     Findings: No erythema or rash.  Neurological:  Mental Status: He is alert.     Coordination: Coordination normal.  Psychiatric:         Behavior: Behavior normal.      ED Treatments / Results  Labs (all labs ordered are listed, but only abnormal results are displayed) Labs Reviewed  CBC WITH DIFFERENTIAL/PLATELET - Abnormal; Notable for the following components:      Result Value   RBC 3.77 (*)    Hemoglobin 11.1 (*)    HCT 35.0 (*)    Platelets 137 (*)    All other components within normal limits  COMPREHENSIVE METABOLIC PANEL - Abnormal; Notable for the following components:   GFR calc non Af Amer 60 (*)    All other components within normal limits  URINALYSIS, ROUTINE W REFLEX MICROSCOPIC - Abnormal; Notable for the following components:   APPearance HAZY (*)    All other components within normal limits  LIPASE, BLOOD  TROPONIN I    EKG EKG Interpretation  Date/Time:  Monday Oct 01 2018 15:27:25 EDT Ventricular Rate:  71 PR Interval:    QRS Duration: 110 QT Interval:  433 QTC Calculation: 471 R Axis:   -59 Text Interpretation:  Atrial fibrillation Incomplete RBBB and LAFB LVH with secondary repolarization abnormality Anterior Q waves, possibly due to LVH since last tracing no significant change afib / flutter unchanged, rate controlled Confirmed by Noemi Chapel [phone]) on 10/01/2018 4:05:18 PM   Radiology Dg Abd Acute 2+v W 1v Chest  Result Date: 10/01/2018 CLINICAL DATA:  Abdominal pain and shortness of breath for 2 weeks EXAM: DG ABDOMEN ACUTE W/ 1V CHEST COMPARISON:  09/24/2018 FINDINGS: Cardiac shadows within normal limits. Lungs are well aerated bilaterally. No focal infiltrate or effusion is seen. No acute bony abnormality is noted. Scattered large and small bowel gas is seen. No obstructive changes are noted. No free air is seen. Scattered hepatic calcifications are seen. Right hip replacement is noted. Degenerative changes of the lumbar spine are seen IMPRESSION: No acute abnormality noted. Electronically Signed   By: Inez Catalina M.D.   On: 10/01/2018 17:14    Procedures Procedures (including  critical care time)  Medications Ordered in ED Medications  0.9 %  sodium chloride infusion ( Intravenous New Bag/Given 10/01/18 1542)  fentaNYL (SUBLIMAZE) injection 50 mcg (50 mcg Intravenous Given 10/01/18 1541)     Initial Impression / Assessment and Plan / ED Course  I have reviewed the triage vital signs and the nursing notes.  Pertinent labs & imaging results that were available during my care of the patient were reviewed by me and considered in my medical decision making (see chart for details).  Clinical Course as of Sep 30 1900  Mon Oct 01, 2018  1716 I have personally viewed and interpreted the xray and ding their to be no acute radiographical findings.  The x-ray shows no acute findings, there is no free air.  His chest x-ray is also unremarkable.  His blood work is totally normal.  Radiologist agrees with my interpretation.   [BM]    Clinical Course User Index [BM] Noemi Chapel, MD       Lung sounds are clear, heart sounds are regular and without murmurs, there is no signs of edema, his abdomen is diffusely tender which could be consistent with worsening diverticulitis, consider perforation in this elderly male who also has some vascular disease, he is not febrile or tachycardic however an acute abdominal series will be obtained to rule out anything in the lungs or  free air in the abdomen.  Pain control, recheck labs for worsening leukocytosis, may need admission to the hospital.  Thankfully the labs are totally unremarkable with no leukocytosis, no elevation in kidney function, no urinary tract infection or signs of dehydration and an x-ray that shows no acute findings.  This was shared with the patient who states he is feeling much better and wants to go home.  He was given the indications for return.  At this time I do not think he needs a repeat CT scan nor does he need admission to the hospital.  He is agreeable to return should his symptoms worsen.  Final Clinical  Impressions(s) / ED Diagnoses   Final diagnoses:  Generalized abdominal pain    ED Discharge Orders         Ordered    HYDROcodone-acetaminophen (NORCO/VICODIN) 5-325 MG tablet  Every 6 hours PRN     10/01/18 1900           Noemi Chapel, MD 10/01/18 1902

## 2018-10-01 NOTE — ED Triage Notes (Addendum)
Pt seen last Wednesday and given antibiotics for Diverticulitis. Pain conts in mid to lower abdomen . Son states pt has complained of HA and dizziness as well. Poor po intake . No NVD. Pt reports SOB since all this started

## 2018-10-01 NOTE — Discharge Instructions (Signed)
Your testing looks normal, thankfully your x-ray looks great, your blood work is very reassuring and I doubt that your symptoms are related to a surgical cause.  You have improved with the medications we have given you and the IV fluids.  You may take hydrocodone 1 tablet every 6 hours for severe pain, please finish your antibiotics and see your doctor in 48 hours.  You must return to the emergency department for increasing pain vomiting or fevers.

## 2018-10-02 DIAGNOSIS — I129 Hypertensive chronic kidney disease with stage 1 through stage 4 chronic kidney disease, or unspecified chronic kidney disease: Secondary | ICD-10-CM | POA: Diagnosis not present

## 2018-10-02 DIAGNOSIS — Z681 Body mass index (BMI) 19 or less, adult: Secondary | ICD-10-CM | POA: Diagnosis not present

## 2018-10-02 DIAGNOSIS — R1084 Generalized abdominal pain: Secondary | ICD-10-CM | POA: Diagnosis not present

## 2018-10-02 DIAGNOSIS — N183 Chronic kidney disease, stage 3 (moderate): Secondary | ICD-10-CM | POA: Diagnosis not present

## 2018-10-02 DIAGNOSIS — R2689 Other abnormalities of gait and mobility: Secondary | ICD-10-CM | POA: Diagnosis not present

## 2018-10-02 DIAGNOSIS — D509 Iron deficiency anemia, unspecified: Secondary | ICD-10-CM | POA: Diagnosis not present

## 2018-10-02 DIAGNOSIS — M6281 Muscle weakness (generalized): Secondary | ICD-10-CM | POA: Diagnosis not present

## 2018-10-02 DIAGNOSIS — R296 Repeated falls: Secondary | ICD-10-CM | POA: Diagnosis not present

## 2018-10-02 DIAGNOSIS — E43 Unspecified severe protein-calorie malnutrition: Secondary | ICD-10-CM | POA: Diagnosis not present

## 2018-10-02 DIAGNOSIS — I4891 Unspecified atrial fibrillation: Secondary | ICD-10-CM | POA: Diagnosis not present

## 2018-10-02 DIAGNOSIS — I10 Essential (primary) hypertension: Secondary | ICD-10-CM | POA: Diagnosis not present

## 2018-10-02 DIAGNOSIS — Z8673 Personal history of transient ischemic attack (TIA), and cerebral infarction without residual deficits: Secondary | ICD-10-CM | POA: Diagnosis not present

## 2018-10-04 DIAGNOSIS — R296 Repeated falls: Secondary | ICD-10-CM | POA: Diagnosis not present

## 2018-10-04 DIAGNOSIS — D509 Iron deficiency anemia, unspecified: Secondary | ICD-10-CM | POA: Diagnosis not present

## 2018-10-04 DIAGNOSIS — E43 Unspecified severe protein-calorie malnutrition: Secondary | ICD-10-CM | POA: Diagnosis not present

## 2018-10-04 DIAGNOSIS — N183 Chronic kidney disease, stage 3 (moderate): Secondary | ICD-10-CM | POA: Diagnosis not present

## 2018-10-04 DIAGNOSIS — Z681 Body mass index (BMI) 19 or less, adult: Secondary | ICD-10-CM | POA: Diagnosis not present

## 2018-10-04 DIAGNOSIS — M6281 Muscle weakness (generalized): Secondary | ICD-10-CM | POA: Diagnosis not present

## 2018-10-04 DIAGNOSIS — Z8673 Personal history of transient ischemic attack (TIA), and cerebral infarction without residual deficits: Secondary | ICD-10-CM | POA: Diagnosis not present

## 2018-10-04 DIAGNOSIS — I4891 Unspecified atrial fibrillation: Secondary | ICD-10-CM | POA: Diagnosis not present

## 2018-10-04 DIAGNOSIS — I129 Hypertensive chronic kidney disease with stage 1 through stage 4 chronic kidney disease, or unspecified chronic kidney disease: Secondary | ICD-10-CM | POA: Diagnosis not present

## 2018-10-04 DIAGNOSIS — R2689 Other abnormalities of gait and mobility: Secondary | ICD-10-CM | POA: Diagnosis not present

## 2018-10-06 ENCOUNTER — Other Ambulatory Visit: Payer: Self-pay | Admitting: Student

## 2018-10-08 ENCOUNTER — Other Ambulatory Visit: Payer: Self-pay | Admitting: Internal Medicine

## 2018-10-08 DIAGNOSIS — I4891 Unspecified atrial fibrillation: Secondary | ICD-10-CM | POA: Diagnosis not present

## 2018-10-08 DIAGNOSIS — N183 Chronic kidney disease, stage 3 (moderate): Secondary | ICD-10-CM | POA: Diagnosis not present

## 2018-10-08 DIAGNOSIS — I129 Hypertensive chronic kidney disease with stage 1 through stage 4 chronic kidney disease, or unspecified chronic kidney disease: Secondary | ICD-10-CM | POA: Diagnosis not present

## 2018-10-08 DIAGNOSIS — Z8673 Personal history of transient ischemic attack (TIA), and cerebral infarction without residual deficits: Secondary | ICD-10-CM | POA: Diagnosis not present

## 2018-10-08 DIAGNOSIS — K5720 Diverticulitis of large intestine with perforation and abscess without bleeding: Secondary | ICD-10-CM | POA: Diagnosis not present

## 2018-10-08 DIAGNOSIS — I719 Aortic aneurysm of unspecified site, without rupture: Secondary | ICD-10-CM | POA: Diagnosis not present

## 2018-10-08 DIAGNOSIS — D509 Iron deficiency anemia, unspecified: Secondary | ICD-10-CM | POA: Diagnosis not present

## 2018-10-08 DIAGNOSIS — E43 Unspecified severe protein-calorie malnutrition: Secondary | ICD-10-CM | POA: Diagnosis not present

## 2018-10-08 DIAGNOSIS — R109 Unspecified abdominal pain: Secondary | ICD-10-CM | POA: Diagnosis not present

## 2018-10-08 DIAGNOSIS — M6281 Muscle weakness (generalized): Secondary | ICD-10-CM | POA: Diagnosis not present

## 2018-10-08 DIAGNOSIS — Z681 Body mass index (BMI) 19 or less, adult: Secondary | ICD-10-CM | POA: Diagnosis not present

## 2018-10-08 DIAGNOSIS — R2689 Other abnormalities of gait and mobility: Secondary | ICD-10-CM | POA: Diagnosis not present

## 2018-10-08 DIAGNOSIS — R1032 Left lower quadrant pain: Secondary | ICD-10-CM

## 2018-10-08 DIAGNOSIS — R296 Repeated falls: Secondary | ICD-10-CM | POA: Diagnosis not present

## 2018-10-08 NOTE — Telephone Encounter (Signed)
Last OV 04/03/18 Scr 1.08 (on 10/01/2018) crcl 28ml/min Xarelto 15mg  daily sent to pharmacy

## 2018-10-09 ENCOUNTER — Ambulatory Visit (HOSPITAL_COMMUNITY)
Admission: RE | Admit: 2018-10-09 | Discharge: 2018-10-09 | Disposition: A | Discharge status: Home / Self Care | Payer: Medicare HMO | Source: Ambulatory Visit | Attending: Internal Medicine | Admitting: Internal Medicine

## 2018-10-09 ENCOUNTER — Other Ambulatory Visit: Payer: Self-pay

## 2018-10-09 DIAGNOSIS — Z8673 Personal history of transient ischemic attack (TIA), and cerebral infarction without residual deficits: Secondary | ICD-10-CM | POA: Diagnosis not present

## 2018-10-09 DIAGNOSIS — I129 Hypertensive chronic kidney disease with stage 1 through stage 4 chronic kidney disease, or unspecified chronic kidney disease: Secondary | ICD-10-CM | POA: Diagnosis not present

## 2018-10-09 DIAGNOSIS — K5733 Diverticulitis of large intestine without perforation or abscess with bleeding: Secondary | ICD-10-CM | POA: Diagnosis not present

## 2018-10-09 DIAGNOSIS — I714 Abdominal aortic aneurysm, without rupture: Secondary | ICD-10-CM | POA: Diagnosis not present

## 2018-10-09 DIAGNOSIS — R296 Repeated falls: Secondary | ICD-10-CM | POA: Diagnosis not present

## 2018-10-09 DIAGNOSIS — R1032 Left lower quadrant pain: Secondary | ICD-10-CM | POA: Diagnosis not present

## 2018-10-09 DIAGNOSIS — D509 Iron deficiency anemia, unspecified: Secondary | ICD-10-CM | POA: Diagnosis not present

## 2018-10-09 DIAGNOSIS — I4891 Unspecified atrial fibrillation: Secondary | ICD-10-CM | POA: Diagnosis not present

## 2018-10-09 DIAGNOSIS — Z681 Body mass index (BMI) 19 or less, adult: Secondary | ICD-10-CM | POA: Diagnosis not present

## 2018-10-09 DIAGNOSIS — N183 Chronic kidney disease, stage 3 (moderate): Secondary | ICD-10-CM | POA: Diagnosis not present

## 2018-10-09 DIAGNOSIS — E43 Unspecified severe protein-calorie malnutrition: Secondary | ICD-10-CM | POA: Diagnosis not present

## 2018-10-09 DIAGNOSIS — M6281 Muscle weakness (generalized): Secondary | ICD-10-CM | POA: Diagnosis not present

## 2018-10-09 DIAGNOSIS — R2689 Other abnormalities of gait and mobility: Secondary | ICD-10-CM | POA: Diagnosis not present

## 2018-10-09 MED ORDER — IOHEXOL 300 MG/ML  SOLN
100.0000 mL | Freq: Once | INTRAMUSCULAR | Status: AC | PRN
  Administered 2018-10-09: 80 mL via INTRAVENOUS

## 2018-10-10 DIAGNOSIS — Z8673 Personal history of transient ischemic attack (TIA), and cerebral infarction without residual deficits: Secondary | ICD-10-CM | POA: Diagnosis not present

## 2018-10-10 DIAGNOSIS — E43 Unspecified severe protein-calorie malnutrition: Secondary | ICD-10-CM | POA: Diagnosis not present

## 2018-10-10 DIAGNOSIS — N183 Chronic kidney disease, stage 3 (moderate): Secondary | ICD-10-CM | POA: Diagnosis not present

## 2018-10-10 DIAGNOSIS — Z681 Body mass index (BMI) 19 or less, adult: Secondary | ICD-10-CM | POA: Diagnosis not present

## 2018-10-10 DIAGNOSIS — I4891 Unspecified atrial fibrillation: Secondary | ICD-10-CM | POA: Diagnosis not present

## 2018-10-10 DIAGNOSIS — M6281 Muscle weakness (generalized): Secondary | ICD-10-CM | POA: Diagnosis not present

## 2018-10-10 DIAGNOSIS — R2689 Other abnormalities of gait and mobility: Secondary | ICD-10-CM | POA: Diagnosis not present

## 2018-10-10 DIAGNOSIS — R296 Repeated falls: Secondary | ICD-10-CM | POA: Diagnosis not present

## 2018-10-10 DIAGNOSIS — I129 Hypertensive chronic kidney disease with stage 1 through stage 4 chronic kidney disease, or unspecified chronic kidney disease: Secondary | ICD-10-CM | POA: Diagnosis not present

## 2018-10-10 DIAGNOSIS — D509 Iron deficiency anemia, unspecified: Secondary | ICD-10-CM | POA: Diagnosis not present

## 2018-10-12 DIAGNOSIS — M6281 Muscle weakness (generalized): Secondary | ICD-10-CM | POA: Diagnosis not present

## 2018-10-12 DIAGNOSIS — R2689 Other abnormalities of gait and mobility: Secondary | ICD-10-CM | POA: Diagnosis not present

## 2018-10-12 DIAGNOSIS — R296 Repeated falls: Secondary | ICD-10-CM | POA: Diagnosis not present

## 2018-10-12 DIAGNOSIS — E43 Unspecified severe protein-calorie malnutrition: Secondary | ICD-10-CM | POA: Diagnosis not present

## 2018-10-12 DIAGNOSIS — Z681 Body mass index (BMI) 19 or less, adult: Secondary | ICD-10-CM | POA: Diagnosis not present

## 2018-10-12 DIAGNOSIS — I129 Hypertensive chronic kidney disease with stage 1 through stage 4 chronic kidney disease, or unspecified chronic kidney disease: Secondary | ICD-10-CM | POA: Diagnosis not present

## 2018-10-12 DIAGNOSIS — D509 Iron deficiency anemia, unspecified: Secondary | ICD-10-CM | POA: Diagnosis not present

## 2018-10-12 DIAGNOSIS — I4891 Unspecified atrial fibrillation: Secondary | ICD-10-CM | POA: Diagnosis not present

## 2018-10-12 DIAGNOSIS — Z8673 Personal history of transient ischemic attack (TIA), and cerebral infarction without residual deficits: Secondary | ICD-10-CM | POA: Diagnosis not present

## 2018-10-12 DIAGNOSIS — N183 Chronic kidney disease, stage 3 (moderate): Secondary | ICD-10-CM | POA: Diagnosis not present

## 2018-10-13 DIAGNOSIS — K5720 Diverticulitis of large intestine with perforation and abscess without bleeding: Secondary | ICD-10-CM | POA: Diagnosis not present

## 2018-10-13 DIAGNOSIS — R109 Unspecified abdominal pain: Secondary | ICD-10-CM | POA: Diagnosis not present

## 2018-10-13 DIAGNOSIS — R110 Nausea: Secondary | ICD-10-CM | POA: Diagnosis not present

## 2018-10-14 DIAGNOSIS — R296 Repeated falls: Secondary | ICD-10-CM | POA: Diagnosis not present

## 2018-10-14 DIAGNOSIS — I129 Hypertensive chronic kidney disease with stage 1 through stage 4 chronic kidney disease, or unspecified chronic kidney disease: Secondary | ICD-10-CM | POA: Diagnosis not present

## 2018-10-14 DIAGNOSIS — D509 Iron deficiency anemia, unspecified: Secondary | ICD-10-CM | POA: Diagnosis not present

## 2018-10-14 DIAGNOSIS — I4891 Unspecified atrial fibrillation: Secondary | ICD-10-CM | POA: Diagnosis not present

## 2018-10-14 DIAGNOSIS — Z681 Body mass index (BMI) 19 or less, adult: Secondary | ICD-10-CM | POA: Diagnosis not present

## 2018-10-14 DIAGNOSIS — R2689 Other abnormalities of gait and mobility: Secondary | ICD-10-CM | POA: Diagnosis not present

## 2018-10-14 DIAGNOSIS — E43 Unspecified severe protein-calorie malnutrition: Secondary | ICD-10-CM | POA: Diagnosis not present

## 2018-10-14 DIAGNOSIS — N183 Chronic kidney disease, stage 3 (moderate): Secondary | ICD-10-CM | POA: Diagnosis not present

## 2018-10-14 DIAGNOSIS — Z8673 Personal history of transient ischemic attack (TIA), and cerebral infarction without residual deficits: Secondary | ICD-10-CM | POA: Diagnosis not present

## 2018-10-14 DIAGNOSIS — M6281 Muscle weakness (generalized): Secondary | ICD-10-CM | POA: Diagnosis not present

## 2018-10-15 DIAGNOSIS — R296 Repeated falls: Secondary | ICD-10-CM | POA: Diagnosis not present

## 2018-10-15 DIAGNOSIS — N183 Chronic kidney disease, stage 3 (moderate): Secondary | ICD-10-CM | POA: Diagnosis not present

## 2018-10-15 DIAGNOSIS — Z681 Body mass index (BMI) 19 or less, adult: Secondary | ICD-10-CM | POA: Diagnosis not present

## 2018-10-15 DIAGNOSIS — Z8673 Personal history of transient ischemic attack (TIA), and cerebral infarction without residual deficits: Secondary | ICD-10-CM | POA: Diagnosis not present

## 2018-10-15 DIAGNOSIS — I129 Hypertensive chronic kidney disease with stage 1 through stage 4 chronic kidney disease, or unspecified chronic kidney disease: Secondary | ICD-10-CM | POA: Diagnosis not present

## 2018-10-15 DIAGNOSIS — E43 Unspecified severe protein-calorie malnutrition: Secondary | ICD-10-CM | POA: Diagnosis not present

## 2018-10-15 DIAGNOSIS — I4891 Unspecified atrial fibrillation: Secondary | ICD-10-CM | POA: Diagnosis not present

## 2018-10-15 DIAGNOSIS — R2689 Other abnormalities of gait and mobility: Secondary | ICD-10-CM | POA: Diagnosis not present

## 2018-10-15 DIAGNOSIS — M6281 Muscle weakness (generalized): Secondary | ICD-10-CM | POA: Diagnosis not present

## 2018-10-15 DIAGNOSIS — D509 Iron deficiency anemia, unspecified: Secondary | ICD-10-CM | POA: Diagnosis not present

## 2018-10-16 ENCOUNTER — Encounter (HOSPITAL_COMMUNITY): Payer: Self-pay | Admitting: Emergency Medicine

## 2018-10-16 ENCOUNTER — Other Ambulatory Visit: Payer: Self-pay

## 2018-10-16 ENCOUNTER — Emergency Department (HOSPITAL_COMMUNITY)
Admission: EM | Admit: 2018-10-16 | Discharge: 2018-10-16 | Disposition: A | Discharge status: Home / Self Care | Payer: Medicare HMO | Source: Home / Self Care | Attending: Emergency Medicine | Admitting: Emergency Medicine

## 2018-10-16 ENCOUNTER — Emergency Department (HOSPITAL_COMMUNITY)
Admission: EM | Admit: 2018-10-16 | Discharge: 2018-10-16 | Disposition: A | Discharge status: Home / Self Care | Payer: Medicare HMO | Attending: Emergency Medicine | Admitting: Emergency Medicine

## 2018-10-16 ENCOUNTER — Emergency Department (HOSPITAL_COMMUNITY): Payer: Medicare HMO

## 2018-10-16 DIAGNOSIS — Z79899 Other long term (current) drug therapy: Secondary | ICD-10-CM | POA: Insufficient documentation

## 2018-10-16 DIAGNOSIS — Z7982 Long term (current) use of aspirin: Secondary | ICD-10-CM | POA: Insufficient documentation

## 2018-10-16 DIAGNOSIS — S0081XA Abrasion of other part of head, initial encounter: Secondary | ICD-10-CM | POA: Diagnosis not present

## 2018-10-16 DIAGNOSIS — M47812 Spondylosis without myelopathy or radiculopathy, cervical region: Secondary | ICD-10-CM | POA: Diagnosis not present

## 2018-10-16 DIAGNOSIS — E860 Dehydration: Secondary | ICD-10-CM | POA: Insufficient documentation

## 2018-10-16 DIAGNOSIS — I714 Abdominal aortic aneurysm, without rupture: Secondary | ICD-10-CM | POA: Insufficient documentation

## 2018-10-16 DIAGNOSIS — S0001XA Abrasion of scalp, initial encounter: Secondary | ICD-10-CM | POA: Insufficient documentation

## 2018-10-16 DIAGNOSIS — R0602 Shortness of breath: Secondary | ICD-10-CM | POA: Diagnosis not present

## 2018-10-16 DIAGNOSIS — Y92019 Unspecified place in single-family (private) house as the place of occurrence of the external cause: Secondary | ICD-10-CM | POA: Insufficient documentation

## 2018-10-16 DIAGNOSIS — Z96641 Presence of right artificial hip joint: Secondary | ICD-10-CM | POA: Insufficient documentation

## 2018-10-16 DIAGNOSIS — N183 Chronic kidney disease, stage 3 (moderate): Secondary | ICD-10-CM | POA: Insufficient documentation

## 2018-10-16 DIAGNOSIS — E039 Hypothyroidism, unspecified: Secondary | ICD-10-CM | POA: Insufficient documentation

## 2018-10-16 DIAGNOSIS — R072 Precordial pain: Secondary | ICD-10-CM | POA: Diagnosis not present

## 2018-10-16 DIAGNOSIS — J449 Chronic obstructive pulmonary disease, unspecified: Secondary | ICD-10-CM | POA: Diagnosis not present

## 2018-10-16 DIAGNOSIS — Z7901 Long term (current) use of anticoagulants: Secondary | ICD-10-CM | POA: Insufficient documentation

## 2018-10-16 DIAGNOSIS — S098XXA Other specified injuries of head, initial encounter: Secondary | ICD-10-CM | POA: Diagnosis not present

## 2018-10-16 DIAGNOSIS — R1032 Left lower quadrant pain: Secondary | ICD-10-CM | POA: Insufficient documentation

## 2018-10-16 DIAGNOSIS — I129 Hypertensive chronic kidney disease with stage 1 through stage 4 chronic kidney disease, or unspecified chronic kidney disease: Secondary | ICD-10-CM | POA: Insufficient documentation

## 2018-10-16 DIAGNOSIS — W0110XA Fall on same level from slipping, tripping and stumbling with subsequent striking against unspecified object, initial encounter: Secondary | ICD-10-CM | POA: Insufficient documentation

## 2018-10-16 DIAGNOSIS — W19XXXA Unspecified fall, initial encounter: Secondary | ICD-10-CM

## 2018-10-16 DIAGNOSIS — Z8673 Personal history of transient ischemic attack (TIA), and cerebral infarction without residual deficits: Secondary | ICD-10-CM | POA: Insufficient documentation

## 2018-10-16 DIAGNOSIS — Z95818 Presence of other cardiac implants and grafts: Secondary | ICD-10-CM | POA: Insufficient documentation

## 2018-10-16 DIAGNOSIS — S0990XA Unspecified injury of head, initial encounter: Secondary | ICD-10-CM | POA: Diagnosis not present

## 2018-10-16 DIAGNOSIS — Z96652 Presence of left artificial knee joint: Secondary | ICD-10-CM | POA: Insufficient documentation

## 2018-10-16 DIAGNOSIS — Y999 Unspecified external cause status: Secondary | ICD-10-CM | POA: Insufficient documentation

## 2018-10-16 DIAGNOSIS — Y9301 Activity, walking, marching and hiking: Secondary | ICD-10-CM | POA: Insufficient documentation

## 2018-10-16 DIAGNOSIS — R05 Cough: Secondary | ICD-10-CM | POA: Diagnosis not present

## 2018-10-16 DIAGNOSIS — R079 Chest pain, unspecified: Secondary | ICD-10-CM | POA: Diagnosis not present

## 2018-10-16 LAB — CBC WITH DIFFERENTIAL/PLATELET
Abs Immature Granulocytes: 0.01 10*3/uL (ref 0.00–0.07)
Basophils Absolute: 0 10*3/uL (ref 0.0–0.1)
Basophils Relative: 1 %
Eosinophils Absolute: 0.1 10*3/uL (ref 0.0–0.5)
Eosinophils Relative: 1 %
HCT: 34.6 % — ABNORMAL LOW (ref 39.0–52.0)
Hemoglobin: 11 g/dL — ABNORMAL LOW (ref 13.0–17.0)
Immature Granulocytes: 0 %
Lymphocytes Relative: 17 %
Lymphs Abs: 0.9 10*3/uL (ref 0.7–4.0)
MCH: 29.2 pg (ref 26.0–34.0)
MCHC: 31.8 g/dL (ref 30.0–36.0)
MCV: 91.8 fL (ref 80.0–100.0)
Monocytes Absolute: 0.5 10*3/uL (ref 0.1–1.0)
Monocytes Relative: 10 %
Neutro Abs: 3.7 10*3/uL (ref 1.7–7.7)
Neutrophils Relative %: 71 %
Platelets: 120 10*3/uL — ABNORMAL LOW (ref 150–400)
RBC: 3.77 MIL/uL — ABNORMAL LOW (ref 4.22–5.81)
RDW: 14.5 % (ref 11.5–15.5)
WBC: 5.2 10*3/uL (ref 4.0–10.5)
nRBC: 0 % (ref 0.0–0.2)

## 2018-10-16 LAB — COMPREHENSIVE METABOLIC PANEL
ALT: 14 U/L (ref 0–44)
AST: 30 U/L (ref 15–41)
Albumin: 3.4 g/dL — ABNORMAL LOW (ref 3.5–5.0)
Alkaline Phosphatase: 57 U/L (ref 38–126)
Anion gap: 8 (ref 5–15)
BUN: 19 mg/dL (ref 8–23)
CO2: 26 mmol/L (ref 22–32)
Calcium: 8.6 mg/dL — ABNORMAL LOW (ref 8.9–10.3)
Chloride: 105 mmol/L (ref 98–111)
Creatinine, Ser: 1.47 mg/dL — ABNORMAL HIGH (ref 0.61–1.24)
GFR calc Af Amer: 48 mL/min — ABNORMAL LOW (ref >60)
GFR calc non Af Amer: 41 mL/min — ABNORMAL LOW (ref >60)
Glucose, Bld: 99 mg/dL (ref 70–99)
Potassium: 4.4 mmol/L (ref 3.5–5.1)
Sodium: 139 mmol/L (ref 135–145)
Total Bilirubin: 0.7 mg/dL (ref 0.3–1.2)
Total Protein: 6.4 g/dL — ABNORMAL LOW (ref 6.5–8.1)

## 2018-10-16 LAB — LIPASE, BLOOD: Lipase: 25 U/L (ref 11–51)

## 2018-10-16 LAB — TROPONIN I: Troponin I: <0.03 ng/mL (ref <0.03)

## 2018-10-16 MED ORDER — CIPROFLOXACIN IN D5W 400 MG/200ML IV SOLN
400.0000 mg | Freq: Once | INTRAVENOUS | Status: AC
  Administered 2018-10-16: 400 mg via INTRAVENOUS
  Filled 2018-10-16: qty 200

## 2018-10-16 MED ORDER — SODIUM CHLORIDE 0.9 % IV BOLUS (SEPSIS)
1000.0000 mL | Freq: Once | INTRAVENOUS | Status: AC
  Administered 2018-10-16: 1000 mL via INTRAVENOUS

## 2018-10-16 MED ORDER — BACITRACIN ZINC 500 UNIT/GM EX OINT
TOPICAL_OINTMENT | CUTANEOUS | Status: AC
  Filled 2018-10-16: qty 1.8

## 2018-10-16 NOTE — Progress Notes (Signed)
Consult request has been received. CSW attempting to follow up at present time  Dasani Crear M. Tanicia Wolaver LCSWA Transitions of Care  Clinical Social Worker  Ph: [phone] 

## 2018-10-16 NOTE — ED Provider Notes (Signed)
[facility] EMERGENCY DEPARTMENT Provider Note   CSN: [id number] Arrival date & time: 10/16/18  0236    History   Chief Complaint Chief Complaint  Patient presents with  . Chest Pain    HPI Jorge Nixon is a 85 y.o. male.     The history is provided by the patient and a relative.  Chest Pain  Pain location:  L chest Pain quality: aching   Pain severity:  Moderate Timing:  Constant Progression:  Resolved Chronicity:  New Relieved by:  Aspirin Worsened by:  Nothing Associated symptoms: abdominal pain and shortness of breath   Associated symptoms: no fever   Patient with history of CKD, COPD, DVT, atrial fibrillation, presents with multiple complaints.  Patient has had diverticulitis recently has been on multiple antibiotics.  Tonight is reported he was had chest pain and had difficulty breathing.  It is now improving with aspirin Currently patient reports feeling improved, denies any acute complaints  Past Medical History:  Diagnosis Date  . Anemia   . Arthritis   . BPH (benign prostatic hyperplasia)   . Carotid disease, bilateral ([place])    a. s/p L CEA in 2014. b. Duplex 05/2016: Right 40-59% ICA stenosis; Left 1-39% ICA stenosis  . CKD (chronic kidney disease), stage III ([place])   . COPD (chronic obstructive pulmonary disease) ([place])   . Diverticular disease 02/14/2011   problems swallowing  . DVT (deep venous thrombosis) ([place])   . Esophageal dysmotility   . Esophageal web   . GERD (gastroesophageal reflux disease)   . Headache(784.0)   . History of blood transfusion   . Hyperlipidemia   . Hypertension   . Hypothyroidism   . Kidney stone   . Nephrolithiasis    lithotripsy 2006  . Non-ischemic cardiomyopathy ([place])    a. Prior EF 45%, normal nuc 2012. b. EF 50-55% in 2018.  [name] PAF (paroxysmal atrial fibrillation) ([place])    a. identified on monitor 06/2016.  [name] Pneumonia    hx of - hospitalized  . Stroke ([place]) 05/2016  . TIA (transient ischemic attack)      Patient Active Problem List   Diagnosis Date Noted  . Hematemesis 09/13/2017  . Hypothyroidism 09/13/2017  . Acute blood loss anemia 09/13/2017  . Abdominal pain, epigastric 09/13/2017  . Hematochezia   . Constipation   . Left-sided chest pain 05/18/2017  . PAF (paroxysmal atrial fibrillation) ([place]) 05/18/2017  . CKD (chronic kidney disease) stage 3, GFR 30-59 ml/min (HCC) 05/18/2017  . Thoracic aortic aneurysm without rupture ([place]) 05/18/2017  . AAA (abdominal aortic aneurysm) without rupture ([place]) 05/18/2017  . Renal artery stenosis ([place]) 05/18/2017  . Thrombocytopenia ([place]) 05/18/2017  . Carotid stenosis R 06/14/2016  . History of CEA (carotid endarterectomy)   . History of stroke   . Hyperlipidemia   . CVA (cerebral vascular accident) ([place]) - sm R cortical cryptogenic infarct 06/11/2016  . Aftercare following surgery of the circulatory system, [place] 10/11/2013  . CVA (cerebral infarction) 02/23/2013  . Occlusion and stenosis of carotid artery with cerebral infarction 02/23/2013  . TIA (transient ischemic attack) 02/22/2013  . Right arm weakness 02/22/2013  . Hypertension 02/22/2013  . Esophageal dysmotility 02/24/2011  . Esophageal web 02/24/2011  . Mouth pain 02/24/2011  . Acute renal failure ([place]) 02/16/2011  . Hypokalemia 02/13/2011  . Protein-calorie malnutrition, severe ([place]) 02/13/2011  . Dysphagia 02/10/2011  . Anemia 02/10/2011  . Weakness generalized 02/10/2011  . S/P hip replacement right 02/10/2011    Past  Surgical History:  Procedure Laterality Date  . CAROTID ENDARTERECTOMY Left 03-11-13   cea  . carpel tunnel Right   . ENDARTERECTOMY Left 03/11/2013   Procedure: ENDARTERECTOMY CAROTID with Vascu-Guard Bovine Patch.;  Surgeon: Conrad Merigold, MD;  Location: [facility];  Service: Vascular;  Laterality: Left;  . EP IMPLANTABLE DEVICE N/A 06/14/2016   Procedure: Loop Recorder Insertion;  Surgeon: Will Meredith Leeds, MD;  Location: [facility];  Service:  Cardiovascular;  Laterality: N/A;  . ESOPHAGOGASTRODUODENOSCOPY N/A 09/14/2017   Procedure: ESOPHAGOGASTRODUODENOSCOPY (EGD);  Surgeon: Rogene Houston, MD;  [facility];  Service: Endoscopy;  Laterality: N/A;  . JOINT REPLACEMENT Right    hip  . JOINT REPLACEMENT Left    knee  . KNEE ARTHROSCOPY Right 2010  . LOOP RECORDER REMOVAL N/A 08/23/2016   Procedure: Loop Recorder Removal;  Surgeon: Will Meredith Leeds, MD;  Location: [facility];  Service: Cardiovascular;  Laterality: N/A;  . REPLACEMENT TOTAL KNEE Left 2006   left knee  . TOTAL HIP ARTHROPLASTY Right august 6th 2012   right side   . TRANSURETHRAL RESECTION OF PROSTATE  1996        Home Medications    Prior to Admission medications   Medication Sig Start Date End Date Taking? Authorizing Provider  acetaminophen (TYLENOL) 500 MG tablet Take 500-1,000 mg by mouth daily as needed for headache.     [provider]  atorvastatin (LIPITOR) 20 MG tablet TAKE 1 TABLET BY MOUTH ONCE DAILY AT 6 PM. Patient taking differently: Take 20 mg by mouth every evening.  07/09/18   Lendon Colonel, NP  docusate sodium (COLACE) 100 MG capsule Take 100 mg by mouth at bedtime.    [provider]  levothyroxine (SYNTHROID) 100 MCG tablet Take 100 mcg by mouth every morning.  05/29/16   [provider]  losartan (COZAAR) 25 MG tablet Take 25 mg by mouth every morning.     [provider]  Multiple Vitamins-Minerals (CENTRUM ADULTS PO) Take 1 tablet by mouth daily.    [provider]  mupirocin ointment (BACTROBAN) 2 % Apply a small amt of ointment to the affected area TID x 10 days Patient not taking: Reported on 06/27/2018 06/10/18   Triplett, Tammy, PA-C  Nutritional Supplements (ENSURE COMPLETE PO) Take 237 mLs by mouth See admin instructions. Drink one can (237 mls) by mouth every other night    [provider]  pantoprazole (PROTONIX) 20 MG tablet Take 1 tablet (20 mg  total) by mouth daily. 06/27/18   Milton Ferguson, MD  Psyllium (METAMUCIL PO) Take 2 scoop by mouth at bedtime. Mix in liquid and drink    [provider]  Sennosides (EQ LAXATIVE) 15 MG CHEW Chew 1 tablet by mouth every evening.    [provider]  silodosin (RAPAFLO) 8 MG CAPS capsule Take 8 mg by mouth every evening. 09/18/18   [provider]  traMADol (ULTRAM) 50 MG tablet Take 50 mg by mouth every 6 (six) hours as needed for moderate pain or severe pain.    [provider]  XARELTO 15 MG TABS tablet TAKE 1 TABLET BY MOUTH DAILY WITH DINNER. 10/08/18   Satira Sark, MD    Family History Family History  Problem Relation Age of Onset  . Stroke Father   . Heart attack Father   . Hypertension Mother   . Hypertension Other   . Diabetes Other     Social History  Social History   Tobacco Use  . Smoking status: Never Smoker  . Smokeless tobacco: Never Used  Substance Use Topics  . Alcohol use: No    Alcohol/week: 0.0 standard drinks  . Drug use: No     Allergies   Sulfa antibiotics and Milk-related compounds   Review of Systems Review of Systems  Constitutional: Negative for fever.  Respiratory: Positive for shortness of breath.   Cardiovascular: Positive for chest pain.  Gastrointestinal: Positive for abdominal pain. Negative for blood in stool.  All other systems reviewed and are negative.    Physical Exam Updated Vital Signs BP (!) 144/80 (BP Location: Left Arm)   Pulse 70   Temp 98.5 F (36.9 C) (Oral)   Resp 16   SpO2 97%   Physical Exam CONSTITUTIONAL: Elderly and frail HEAD: Normocephalic/atraumatic EYES: EOMI ENMT: Mucous membranes moist NECK: supple no meningeal signs SPINE/BACK:entire spine nontender CV: S1/S2 noted, no murmurs/rubs/gallops noted LUNGS: Lungs are clear to auscultation bilaterally, no apparent distress Chest-no bruising or crepitus noted, mild tenderness to lower chest wall ABDOMEN: soft, mild  tenderness to LLQ, no rebound or guarding, bowel sounds noted throughout abdomen GU:no cva tenderness, no inguinal hernia, no scrotal tenderness or edema, nurse chaperone present NEURO: Pt is awake/alert/appropriate, moves all extremitiesx4.  No facial droop.   EXTREMITIES: pulses normal/equal, full ROM SKIN: warm, color normal PSYCH: no abnormalities of mood noted, alert and oriented to situation   ED Treatments / Results  Labs (all labs ordered are listed, but only abnormal results are displayed) Labs Reviewed  CBC WITH DIFFERENTIAL/PLATELET - Abnormal; Notable for the following components:      Result Value   RBC 3.77 (*)    Hemoglobin 11.0 (*)    HCT 34.6 (*)    Platelets 120 (*)    All other components within normal limits  COMPREHENSIVE METABOLIC PANEL - Abnormal; Notable for the following components:   Creatinine, Ser 1.47 (*)    Calcium 8.6 (*)    Total Protein 6.4 (*)    Albumin 3.4 (*)    GFR calc non Af Amer 41 (*)    GFR calc Af Amer 48 (*)    All other components within normal limits  LIPASE, BLOOD  TROPONIN I    EKG EKG Interpretation  Date/Time:  Tuesday Oct 16 2018 02:51:26 EDT Ventricular Rate:  75 PR Interval:    QRS Duration: 111 QT Interval:  424 QTC Calculation: 474 R Axis:   -56 Text Interpretation:  Atrial fibrillation Left anterior fascicular block LVH with secondary repolarization abnormality Interpretation limited secondary to artifact No significant change since last tracing Confirmed by Ripley Fraise ([id number]) on 10/16/2018 3:47:03 AM   Radiology Dg Chest Port 1 View  Result Date: 10/16/2018 CLINICAL DATA:  85 year old male with acute chest pain at 0100 hours. Productive cough. EXAM: PORTABLE CHEST 1 VIEW COMPARISON:  Recent CT Abdomen and Pelvis 10/09/2018. 10/01/2018 abdominal series and earlier. FINDINGS: Portable AP upright views at 0321 hours. Stable cardiac size and mediastinal contours. Stable lung volumes. No pneumothorax, pulmonary  edema or pleural effusion. Stable apically and mild right middle lobe lung scarring. No new pulmonary opacity. Negative visible bowel gas pattern. IMPRESSION: Stable chronic lung disease.  No acute cardiopulmonary abnormality. Electronically Signed   By: Genevie Ann M.D.   On: 10/16/2018 03:39    Procedures Procedures   Medications Ordered in ED Medications  ciprofloxacin (CIPRO) IVPB 400 mg (400 mg Intravenous New Bag/Given 10/16/18 0614)  sodium chloride 0.9 %  bolus 1,000 mL (1,000 mLs Intravenous New Bag/Given 10/16/18 0998)     Initial Impression / Assessment and Plan / ED Course  I have reviewed the triage vital signs and the nursing notes.  Pertinent labs & imaging results that were available during my care of the patient were reviewed by me and considered in my medical decision making (see chart for details).        4:36 AM Patient with history of diverticulitis.  He had a CT in April and then a CT 1 week ago that showed improving diverticulitis.  Apparently he has been on Augmentin, and the son reports it was switched to Cipro.  Son feels the patient needs to have IV antibiotics.  Son reports the patient has had recent decreased p.o. and lack of sleep.  New issues tonight included the chest pain and difficulty breathing.  Son also reported that patient has difficulty tolerating opioids has been on hydrocodone and tramadol. Son is Chaunce Winkels - [phone] 6:43 AM Patient has been resting comfortably.  No distress.  He denies any complaints. For his chest pain there is no acute EKG changes, chest xray was negative, troponin negative.  He denies any chest pain at this time. Patient did have LLQ tenderness, but this is not worsened, no signs of acute abdomen.  This appears to be an ongoing process for several weeks.  CT imaging a week ago revealed improving diverticulitis.  I discussed the case with his family via phone They request a one-time dose of IV Cipro.  We will also give IV fluids  as he is mildly dehydrated At this point, does not appear that he requires repeat imaging as white count is not elevated and patient is in no distress.  Patient will continue outpatient antibiotics and will need to have close follow with PCP Final Clinical Impressions(s) / ED Diagnoses   Final diagnoses:  Precordial pain  Left lower quadrant abdominal pain  Dehydration    ED Discharge Orders    None       Ripley Fraise, MD 10/16/18 [phone]

## 2018-10-16 NOTE — ED Provider Notes (Signed)
[facility] EMERGENCY DEPARTMENT Provider Note   CSN: [id number] Arrival date & time: 10/16/18  1540    History   Chief Complaint Chief Complaint  Patient presents with   Fall    HPI Jorge Nixon is a 85 y.o. male.     HPI   He was walking in his home when he fell forward striking his head.  He is not sure why he fell but states that he has trouble walking.  He was here earlier this morning with abdominal pain and states the pain has gotten better.  He has been able to eat some today.  He denies headache, neck pain, back pain, weakness or dizziness at this time.  There are no other known modifying factors.  Past Medical History:  Diagnosis Date   Anemia    Arthritis    BPH (benign prostatic hyperplasia)    Carotid disease, bilateral ([place])    a. s/p L CEA in 2014. b. Duplex 05/2016: Right 40-59% ICA stenosis; Left 1-39% ICA stenosis   CKD (chronic kidney disease), stage III (HCC)    COPD (chronic obstructive pulmonary disease) ([place])    Diverticular disease 02/14/2011   problems swallowing   DVT (deep venous thrombosis) (HCC)    Esophageal dysmotility    Esophageal web    GERD (gastroesophageal reflux disease)    Headache(784.0)    History of blood transfusion    Hyperlipidemia    Hypertension    Hypothyroidism    Kidney stone    Nephrolithiasis    lithotripsy 2006   Non-ischemic cardiomyopathy ([place])    a. Prior EF 45%, normal nuc 2012. b. EF 50-55% in 2018.   PAF (paroxysmal atrial fibrillation) ([place])    a. identified on monitor 06/2016.   Pneumonia    hx of - hospitalized   Stroke ([place]) 05/2016   TIA (transient ischemic attack)     Patient Active Problem List   Diagnosis Date Noted   Hematemesis 09/13/2017   Hypothyroidism 09/13/2017   Acute blood loss anemia 09/13/2017   Abdominal pain, epigastric 09/13/2017   Hematochezia    Constipation    Left-sided chest pain 05/18/2017   PAF (paroxysmal atrial fibrillation) ([place])  05/18/2017   CKD (chronic kidney disease) stage 3, GFR 30-59 ml/min (HCC) 05/18/2017   Thoracic aortic aneurysm without rupture ([place]) 05/18/2017   AAA (abdominal aortic aneurysm) without rupture ([place]) 05/18/2017   Renal artery stenosis (HCC) 05/18/2017   Thrombocytopenia ([place]) 05/18/2017   Carotid stenosis R 06/14/2016   History of CEA (carotid endarterectomy)    History of stroke    Hyperlipidemia    CVA (cerebral vascular accident) ([place]) - sm R cortical cryptogenic infarct 06/11/2016   Aftercare following surgery of the circulatory system, NEC 10/11/2013   CVA (cerebral infarction) 02/23/2013   Occlusion and stenosis of carotid artery with cerebral infarction 02/23/2013   TIA (transient ischemic attack) 02/22/2013   Right arm weakness 02/22/2013   Hypertension 02/22/2013   Esophageal dysmotility 02/24/2011   Esophageal web 02/24/2011   Mouth pain 02/24/2011   Acute renal failure ([place]) 02/16/2011   Hypokalemia 02/13/2011   Protein-calorie malnutrition, severe ([place]) 02/13/2011   Dysphagia 02/10/2011   Anemia 02/10/2011   Weakness generalized 02/10/2011   S/P hip replacement right 02/10/2011    Past Surgical History:  Procedure Laterality Date   CAROTID ENDARTERECTOMY Left 03-11-13   cea   carpel tunnel Right    ENDARTERECTOMY Left 03/11/2013   Procedure: ENDARTERECTOMY CAROTID with Vascu-Guard Bovine Patch.;  Surgeon: Conrad Wide Ruins, MD;  Location: [place];  Service: Vascular;  Laterality: Left;   EP IMPLANTABLE DEVICE N/A 06/14/2016   Procedure: Loop Recorder Insertion;  Surgeon: Will Meredith Leeds, MD;  Location: [place] [facility];  Service: Cardiovascular;  Laterality: N/A;   ESOPHAGOGASTRODUODENOSCOPY N/A 09/14/2017   Procedure: ESOPHAGOGASTRODUODENOSCOPY (EGD);  Surgeon: Rogene Houston, MD;  [facility];  Service: Endoscopy;  Laterality: N/A;   JOINT REPLACEMENT Right    hip   JOINT REPLACEMENT Left    knee   KNEE  ARTHROSCOPY Right 2010   LOOP RECORDER REMOVAL N/A 08/23/2016   Procedure: Loop Recorder Removal;  Surgeon: Will Meredith Leeds, MD;  Location: [place] [facility];  Service: Cardiovascular;  Laterality: N/A;   REPLACEMENT TOTAL KNEE Left 2006   left knee   TOTAL HIP ARTHROPLASTY Right august 6th 2012   right side    TRANSURETHRAL RESECTION OF PROSTATE  1996        Home Medications    Prior to Admission medications   Medication Sig Start Date End Date Taking? Authorizing Provider  acetaminophen (TYLENOL) 500 MG tablet Take 500-1,000 mg by mouth daily as needed for headache.    Yes [provider]  aspirin 81 MG chewable tablet Chew 324 mg by mouth once.   Yes [provider]  atorvastatin (LIPITOR) 20 MG tablet TAKE 1 TABLET BY MOUTH ONCE DAILY AT 6 PM. Patient taking differently: Take 20 mg by mouth every evening.  07/09/18  Yes Lendon Colonel, NP  docusate sodium (COLACE) 100 MG capsule Take 100 mg by mouth at bedtime.   Yes [provider]  levothyroxine (SYNTHROID) 100 MCG tablet Take 100 mcg by mouth every morning.  05/29/16  Yes [provider]  losartan (COZAAR) 25 MG tablet Take 25 mg by mouth every morning.    Yes [provider]  Multiple Vitamins-Minerals (CENTRUM ADULTS PO) Take 1 tablet by mouth daily.   Yes [provider]  Nutritional Supplements (ENSURE COMPLETE PO) Take 237 mLs by mouth See admin instructions. Drink one can (237 mls) by mouth every other night   Yes [provider]  pantoprazole (PROTONIX) 20 MG tablet Take 1 tablet (20 mg total) by mouth daily. 06/27/18  Yes Milton Ferguson, MD  Psyllium (METAMUCIL PO) Take 2 scoop by mouth at bedtime. Mix in liquid and drink   Yes [provider]  Sennosides (EQ LAXATIVE) 15 MG CHEW Chew 1 tablet by mouth every evening.   Yes [provider]  silodosin (RAPAFLO) 8 MG CAPS capsule Take 8 mg by mouth every evening. 09/18/18  Yes [provider]  traMADol (ULTRAM) 50 MG tablet Take 50 mg by mouth every 6 (six) hours as needed for moderate pain or severe pain.   Yes [provider]  XARELTO 15 MG TABS tablet TAKE 1 TABLET BY MOUTH DAILY WITH DINNER. Patient taking differently: Take 15 mg by mouth daily with supper.  10/08/18  Yes Satira Sark, MD  mupirocin ointment Drue Stager) 2 % Apply a small amt of ointment to the affected area TID x 10 days Patient not taking: Reported on 06/27/2018 06/10/18   Kem Parkinson, PA-C    Family History Family History  Problem Relation Age of Onset   Stroke Father    Heart attack Father    Hypertension Mother    Hypertension Other    Diabetes Other     Social History Social History   Tobacco Use  Smoking status: Never Smoker   Smokeless tobacco: Never Used  Substance Use Topics   Alcohol use: No    Alcohol/week: 0.0 standard drinks   Drug use: No     Allergies   Sulfa antibiotics and Milk-related compounds   Review of Systems Review of Systems  All other systems reviewed and are negative.    Physical Exam Updated Vital Signs BP (!) 166/95    Pulse 69    Temp 97.8 F (36.6 C) (Oral)    Resp 18    Ht 5\' 10"  (1.778 m)    Wt 54 kg    SpO2 100%    BMI 17.07 kg/m   Physical Exam Vitals signs and nursing note reviewed.  Constitutional:      General: He is not in acute distress.    Appearance: He is well-developed and normal weight. He is not ill-appearing, toxic-appearing or diaphoretic.  HENT:     Head: Normocephalic.     Comments: Abrasion mid frontal scalp, without crepitation or deformity.  No significant swelling of the scalp or face.  No trismus.    Right Ear: External ear normal.     Left Ear: External ear normal.     Mouth/Throat:     Mouth: Mucous membranes are moist.  Eyes:     Conjunctiva/sclera: Conjunctivae normal.     Pupils: Pupils are equal, round, and reactive to light.  Neck:     Musculoskeletal: Normal range of  motion and neck supple.     Trachea: Phonation normal.  Cardiovascular:     Rate and Rhythm: Normal rate.  Pulmonary:     Effort: Pulmonary effort is normal.  Abdominal:     Palpations: Abdomen is soft.     Tenderness: There is no abdominal tenderness.  Musculoskeletal: Normal range of motion.        General: No swelling or tenderness.  Skin:    General: Skin is warm and dry.  Neurological:     Mental Status: He is alert and oriented to person, place, and time.     Cranial Nerves: No cranial nerve deficit.     Sensory: No sensory deficit.     Motor: No abnormal muscle tone.     Coordination: Coordination normal.  Psychiatric:        Mood and Affect: Mood normal.        Behavior: Behavior normal.      ED Treatments / Results  Labs (all labs ordered are listed, but only abnormal results are displayed) Labs Reviewed - No data to display  EKG None  Radiology Ct Head Wo Contrast  Result Date: 10/16/2018 CLINICAL DATA:  85 y/o  M; fall with abrasion to the forehead. EXAM: CT HEAD WITHOUT CONTRAST CT CERVICAL SPINE WITHOUT CONTRAST TECHNIQUE: Multidetector CT imaging of the head and cervical spine was performed following the standard protocol without intravenous contrast. Multiplanar CT image reconstructions of the cervical spine were also generated. COMPARISON:  02/10/2018 CT of the head. FINDINGS: CT HEAD FINDINGS Brain: No evidence of acute infarction, hemorrhage, hydrocephalus, extra-axial collection or mass lesion/mass effect. Stable chronic microvascular ischemic changes and volume loss of the brain. Vascular: Calcific atherosclerosis of the carotid siphons and vertebral arteries. No hyperdense vessel. Skull: Normal. Negative for fracture or focal lesion. Sinuses/Orbits: No acute finding. Other: None. CT CERVICAL SPINE FINDINGS Alignment: Normal. Skull base and vertebrae: No acute fracture. No primary bone lesion or focal pathologic process. Soft tissues and spinal canal: No  prevertebral fluid or swelling.  No visible canal hematoma. Disc levels: Moderate spondylosis of the cervical spine with predominant discogenic degenerative changes. Mild right-greater-than-left facet arthropathy. Uncovertebral and facet hypertrophy encroach on the neural foramen at the left C3-4, bilateral C4-5, bilateral C5-6, bilateral C6-7 levels. C3-4 small central disc protrusion results in mild spinal canal stenosis and there is multifactorial mild spinal canal stenosis at the C6-7 level. Upper chest: Biapical calcified pleuroparenchymal scarring. Other: Calcific atherosclerosis of the bilateral carotid systems. IMPRESSION: 1. No acute intracranial abnormality or calvarial fracture. 2. No acute fracture or dislocation of cervical spine. 3. Stable chronic microvascular ischemic changes and parenchymal volume loss of the brain. 4. Moderate cervical spondylosis greatest at the C6-7 level. Electronically Signed   By: Kristine Garbe M.D.   On: 10/16/2018 20:50   Ct Cervical Spine Wo Contrast  Result Date: 10/16/2018 CLINICAL DATA:  84 y/o  M; fall with abrasion to the forehead. EXAM: CT HEAD WITHOUT CONTRAST CT CERVICAL SPINE WITHOUT CONTRAST TECHNIQUE: Multidetector CT imaging of the head and cervical spine was performed following the standard protocol without intravenous contrast. Multiplanar CT image reconstructions of the cervical spine were also generated. COMPARISON:  02/10/2018 CT of the head. FINDINGS: CT HEAD FINDINGS Brain: No evidence of acute infarction, hemorrhage, hydrocephalus, extra-axial collection or mass lesion/mass effect. Stable chronic microvascular ischemic changes and volume loss of the brain. Vascular: Calcific atherosclerosis of the carotid siphons and vertebral arteries. No hyperdense vessel. Skull: Normal. Negative for fracture or focal lesion. Sinuses/Orbits: No acute finding. Other: None. CT CERVICAL SPINE FINDINGS Alignment: Normal. Skull base and vertebrae: No acute  fracture. No primary bone lesion or focal pathologic process. Soft tissues and spinal canal: No prevertebral fluid or swelling. No visible canal hematoma. Disc levels: Moderate spondylosis of the cervical spine with predominant discogenic degenerative changes. Mild right-greater-than-left facet arthropathy. Uncovertebral and facet hypertrophy encroach on the neural foramen at the left C3-4, bilateral C4-5, bilateral C5-6, bilateral C6-7 levels. C3-4 small central disc protrusion results in mild spinal canal stenosis and there is multifactorial mild spinal canal stenosis at the C6-7 level. Upper chest: Biapical calcified pleuroparenchymal scarring. Other: Calcific atherosclerosis of the bilateral carotid systems. IMPRESSION: 1. No acute intracranial abnormality or calvarial fracture. 2. No acute fracture or dislocation of cervical spine. 3. Stable chronic microvascular ischemic changes and parenchymal volume loss of the brain. 4. Moderate cervical spondylosis greatest at the C6-7 level. Electronically Signed   By: Kristine Garbe M.D.   On: 10/16/2018 20:50   Dg Chest Port 1 View  Result Date: 10/16/2018 CLINICAL DATA:  85 year old male with acute chest pain at 0100 hours. Productive cough. EXAM: PORTABLE CHEST 1 VIEW COMPARISON:  Recent CT Abdomen and Pelvis 10/09/2018. 10/01/2018 abdominal series and earlier. FINDINGS: Portable AP upright views at 0321 hours. Stable cardiac size and mediastinal contours. Stable lung volumes. No pneumothorax, pulmonary edema or pleural effusion. Stable apically and mild right middle lobe lung scarring. No new pulmonary opacity. Negative visible bowel gas pattern. IMPRESSION: Stable chronic lung disease.  No acute cardiopulmonary abnormality. Electronically Signed   By: Genevie Ann M.D.   On: 10/16/2018 03:39    Procedures Procedures (including critical care time)  Medications Ordered in ED Medications  bacitracin 500 UNIT/GM ointment (has no administration in time  range)     Initial Impression / Assessment and Plan / ED Course  I have reviewed the triage vital signs and the nursing notes.  Pertinent labs & imaging results that were available during my care of the patient were  reviewed by me and considered in my medical decision making (see chart for details).  Clinical Course as of Oct 16 2107  Tue Oct 16, 2018  2104 Case discussed with Dominica Severin, his son, and his wife Debby. They agree with Home Health evaluation in the home.    [EW]    Clinical Course User Index [EW] Daleen Bo, MD        Patient Vitals for the past 24 hrs:  BP Temp Temp src Pulse Resp SpO2 Height Weight  10/16/18 2100 (!) 166/95 -- -- 69 -- 100 % -- --  10/16/18 2030 (!) 176/105 -- -- 80 -- 100 % -- --  10/16/18 1800 (!) 164/94 -- -- (!) 59 -- 100 % -- --  10/16/18 1608 (!) 159/105 97.8 F (36.6 C) Oral 75 18 100 % 5\' 10"  (1.778 m) 54 kg    8:59 PM Reevaluation with update and discussion. After initial assessment and treatment, an updated evaluation reveals no change in clinical status. Case discussed with son. All questions answered. Daleen Bo   Medical Decision Making: Although serious injury.  Patient being treated for diverticulitis, and had comprehensive evaluation within the last 24 hours in this ED.  At this point there is no indication for hospitalization or further work-up.  Patient desires to go home.  Family members informed and are comfortable with discharge with home health service follow-up in the home.  CRITICAL CARE-no Performed by: Daleen Bo  Nursing Notes Reviewed/ Care Coordinated Applicable Imaging Reviewed Interpretation of Laboratory Data incorporated into ED treatment  The patient appears reasonably screened and/or stabilized for discharge and I doubt any other medical condition or other [facility] requiring further screening, evaluation, or treatment in the ED at this time prior to discharge.  Plan: Home Medications-continue usual; Home  Treatments-push fluids and food; return here if the recommended treatment, does not improve the symptoms; Recommended follow up-PCP checkup 1 week and home health services as soon as possible.   Final Clinical Impressions(s) / ED Diagnoses   Final diagnoses:  Injury of head, initial encounter  Fall in home, initial encounter  Abrasion of scalp, initial encounter    ED Discharge Orders    None       Daleen Bo, MD 10/16/18 2109

## 2018-10-16 NOTE — ED Triage Notes (Signed)
Pt was getting out of a chair, states " my legs gave way and I fell head first on the carpet". Abrasion to forehead. Bleeding controlled.  Pt is on blood thinners. A/O x 4

## 2018-10-16 NOTE — Discharge Instructions (Signed)
°  SEEK IMMEDIATE MEDICAL ATTENTION IF: °The pain does not go away or becomes severe, particularly over the next 8-12 hours.  °A temperature above 100.4F develops.  °Repeated vomiting occurs (multiple episodes).  ° °Blood is being passed in stools or vomit (bright red or black tarry stools).  °Return also if you develop chest pain, difficulty breathing, dizziness or fainting, or become confused, poorly responsive, or inconsolable. ° °

## 2018-10-16 NOTE — Discharge Instructions (Signed)
There were no serious injuries found from the fall today.  Make sure you are getting plenty of rest, drink a lot of fluids and try to eat 3 meals a day.  For the wound on your forehead clean it well with soap and water once or twice a day and apply an ointment such as bacitracin.  For pain use Tylenol.

## 2018-10-16 NOTE — ED Triage Notes (Signed)
Pt C/O "throbbing" chest pain that started at 0100 this AM. Pt reports that the pain is no longer there. Denies N/V.

## 2018-10-16 NOTE — TOC Initial Note (Signed)
Transition of Care [facility]) - Initial/Assessment Note    Patient Details  Name: Jorge Nixon MRN: [id number] Date of Birth: Jul 09, 1924  Transition of Care [facility]) CM/SW Contact:    Kerrin Markman Dimitri Ped, LCSW Phone Number: 10/16/2018, 9:33 PM  Clinical Narrative:  Pt is a 85 year old male admitted with the following Dx: injury of head, at home fall. Prior to being seen at the ED, pt resided at home with his wife while receiving support from his daughter Jorge Nixon.   Pt's daughter Jorge Nixon provides support by assisting with transportation to and from medical appointments, cooking, and general support.   Pt has the following DME: raised toilet seat, standard walker, cane, shower seat and hand rails. Pt is currently receiving HH/PT/OT/RN/nurse aid from [facility].   Family on the way to pick up pt for discharge.                 Expected Discharge Plan: [place] Barriers to Discharge: No Barriers Identified   Patient Goals and CMS Choice Patient states their goals for this hospitalization and ongoing recovery are:: to return home with support of family and continued home health services  CMS Medicare.gov Compare Post Acute Care list provided to:: Patient Choice offered to / list presented to : Patient  Expected Discharge Plan and Services Expected Discharge Plan: [place]   Discharge Planning Services: Follow-up appt scheduled Post Acute Care Choice: Home Health Living arrangements for the past 2 months: Single Family Home                           HH Arranged: RN, OT, Nurse's Aide, PT HH Agency: [facility]        Prior Living Arrangements/Services Living arrangements for the past 2 months: [place] Lives with:: Spouse Patient language and need for interpreter reviewed:: Yes Do you feel safe going back to the place where you live?: Yes      Need for Family Participation in Patient Care: Yes  (Comment) Care giver support system in place?: Yes (comment) Current home services: Home PT, DME, Homehealth aide, Home RN, Home OT Criminal Activity/Legal Involvement Pertinent to Current Situation/Hospitalization: No - Comment as needed  Activities of Daily Living      Permission Sought/Granted Permission sought to share information with : Family Supports Permission granted to share information with : Yes, Verbal Permission Granted  Share Information with NAME: Jorge Nixon            Emotional Assessment Appearance:: Appears stated age Attitude/Demeanor/Rapport: Engaged Affect (typically observed): Calm Orientation: : Oriented to Self, Oriented to  Time, Oriented to Situation, Oriented to Place   Psych Involvement: No (comment)  Admission diagnosis:  Fall  Patient Active Problem List   Diagnosis Date Noted  . Hematemesis 09/13/2017  . Hypothyroidism 09/13/2017  . Acute blood loss anemia 09/13/2017  . Abdominal pain, epigastric 09/13/2017  . Hematochezia   . Constipation   . Left-sided chest pain 05/18/2017  . PAF (paroxysmal atrial fibrillation) ([place]) 05/18/2017  . CKD (chronic kidney disease) stage 3, GFR 30-59 ml/min (HCC) 05/18/2017  . Thoracic aortic aneurysm without rupture ([place]) 05/18/2017  . AAA (abdominal aortic aneurysm) without rupture ([place]) 05/18/2017  . Renal artery stenosis ([place]) 05/18/2017  . Thrombocytopenia ([place]) 05/18/2017  . Carotid stenosis R 06/14/2016  . History of CEA (carotid endarterectomy)   . History of stroke   .  Hyperlipidemia   . CVA (cerebral vascular accident) ([place]) - sm R cortical cryptogenic infarct 06/11/2016  . Aftercare following surgery of the circulatory system, [place] 10/11/2013  . CVA (cerebral infarction) 02/23/2013  . Occlusion and stenosis of carotid artery with cerebral infarction 02/23/2013  . TIA (transient ischemic attack) 02/22/2013  . Right arm weakness 02/22/2013  . Hypertension 02/22/2013  . Esophageal dysmotility  02/24/2011  . Esophageal web 02/24/2011  . Mouth pain 02/24/2011  . Acute renal failure ([place]) 02/16/2011  . Hypokalemia 02/13/2011  . Protein-calorie malnutrition, severe ([place]) 02/13/2011  . Dysphagia 02/10/2011  . Anemia 02/10/2011  . Weakness generalized 02/10/2011  . S/P hip replacement right 02/10/2011   PCP:  Celene Squibb, MD Pharmacy:   [facility], [place] [address] Phone: [phone] Fax: [phone]     Social Determinants of Health (SDOH) Interventions    Readmission Risk Interventions No flowsheet data found.

## 2018-10-24 DIAGNOSIS — E441 Mild protein-calorie malnutrition: Secondary | ICD-10-CM | POA: Diagnosis not present

## 2018-10-24 DIAGNOSIS — I719 Aortic aneurysm of unspecified site, without rupture: Secondary | ICD-10-CM | POA: Diagnosis not present

## 2018-10-24 DIAGNOSIS — M25551 Pain in right hip: Secondary | ICD-10-CM | POA: Diagnosis not present

## 2018-10-24 DIAGNOSIS — S0081XD Abrasion of other part of head, subsequent encounter: Secondary | ICD-10-CM | POA: Diagnosis not present

## 2018-10-24 DIAGNOSIS — R634 Abnormal weight loss: Secondary | ICD-10-CM | POA: Diagnosis not present

## 2018-10-24 DIAGNOSIS — K5720 Diverticulitis of large intestine with perforation and abscess without bleeding: Secondary | ICD-10-CM | POA: Diagnosis not present

## 2018-10-24 DIAGNOSIS — R109 Unspecified abdominal pain: Secondary | ICD-10-CM | POA: Diagnosis not present

## 2018-10-24 DIAGNOSIS — Z9181 History of falling: Secondary | ICD-10-CM | POA: Diagnosis not present

## 2018-11-25 ENCOUNTER — Emergency Department (HOSPITAL_COMMUNITY)
Admission: EM | Admit: 2018-11-25 | Discharge: 2018-11-26 | Disposition: A | Discharge status: Home / Self Care | Payer: Medicare Other | Attending: Emergency Medicine | Admitting: Emergency Medicine

## 2018-11-25 ENCOUNTER — Other Ambulatory Visit: Payer: Self-pay

## 2018-11-25 ENCOUNTER — Encounter (HOSPITAL_COMMUNITY): Payer: Self-pay

## 2018-11-25 DIAGNOSIS — R339 Retention of urine, unspecified: Secondary | ICD-10-CM | POA: Diagnosis present

## 2018-11-25 DIAGNOSIS — J449 Chronic obstructive pulmonary disease, unspecified: Secondary | ICD-10-CM | POA: Insufficient documentation

## 2018-11-25 DIAGNOSIS — Z8673 Personal history of transient ischemic attack (TIA), and cerebral infarction without residual deficits: Secondary | ICD-10-CM | POA: Diagnosis not present

## 2018-11-25 DIAGNOSIS — I129 Hypertensive chronic kidney disease with stage 1 through stage 4 chronic kidney disease, or unspecified chronic kidney disease: Secondary | ICD-10-CM | POA: Diagnosis not present

## 2018-11-25 DIAGNOSIS — Z79899 Other long term (current) drug therapy: Secondary | ICD-10-CM | POA: Diagnosis not present

## 2018-11-25 DIAGNOSIS — Z7982 Long term (current) use of aspirin: Secondary | ICD-10-CM | POA: Diagnosis not present

## 2018-11-25 DIAGNOSIS — N183 Chronic kidney disease, stage 3 (moderate): Secondary | ICD-10-CM | POA: Insufficient documentation

## 2018-11-25 DIAGNOSIS — E039 Hypothyroidism, unspecified: Secondary | ICD-10-CM | POA: Insufficient documentation

## 2018-11-25 DIAGNOSIS — Z7901 Long term (current) use of anticoagulants: Secondary | ICD-10-CM | POA: Insufficient documentation

## 2018-11-25 MED ORDER — SODIUM CHLORIDE 0.9 % IV BOLUS: 500.0000 mL | Freq: Once | INTRAVENOUS | Status: DC

## 2018-11-25 NOTE — ED Triage Notes (Signed)
Pt presents to ED for urinary retention. Pt was sent by Hospice nurse. Hospice nurse attempted to insert catheter but did not have a catheter small enough. Pt still passes urine but very little. Hospice nurse recommends placing catheter.

## 2018-11-25 NOTE — ED Notes (Signed)
Jorge Nixon Kandice Robinsons [phone]

## 2018-11-25 NOTE — ED Notes (Signed)
Bladder scan: >200cc  Attempted to place catheter per EDP Zackowski

## 2018-11-25 NOTE — ED Notes (Signed)
Two attempts by two different nurses to place catheter in patient.  First attempt was with a 16 fr. Second attempt was with a 16 fr. [place]

## 2018-11-26 LAB — BASIC METABOLIC PANEL
Anion gap: 12 (ref 5–15)
BUN: 13 mg/dL (ref 8–23)
CO2: 27 mmol/L (ref 22–32)
Calcium: 8.5 mg/dL — ABNORMAL LOW (ref 8.9–10.3)
Chloride: 100 mmol/L (ref 98–111)
Creatinine, Ser: 1.07 mg/dL (ref 0.61–1.24)
GFR calc Af Amer: >60 mL/min (ref >60)
GFR calc non Af Amer: >60 mL/min (ref >60)
Glucose, Bld: 113 mg/dL — ABNORMAL HIGH (ref 70–99)
Potassium: 3.5 mmol/L (ref 3.5–5.1)
Sodium: 139 mmol/L (ref 135–145)

## 2018-11-26 LAB — CBC WITH DIFFERENTIAL/PLATELET
Abs Immature Granulocytes: 0.03 10*3/uL (ref 0.00–0.07)
Basophils Absolute: 0 10*3/uL (ref 0.0–0.1)
Basophils Relative: 1 %
Eosinophils Absolute: 0.1 10*3/uL (ref 0.0–0.5)
Eosinophils Relative: 1 %
HCT: 36.3 % — ABNORMAL LOW (ref 39.0–52.0)
Hemoglobin: 11.1 g/dL — ABNORMAL LOW (ref 13.0–17.0)
Immature Granulocytes: 1 %
Lymphocytes Relative: 10 %
Lymphs Abs: 0.6 10*3/uL — ABNORMAL LOW (ref 0.7–4.0)
MCH: 27.5 pg (ref 26.0–34.0)
MCHC: 30.6 g/dL (ref 30.0–36.0)
MCV: 90.1 fL (ref 80.0–100.0)
Monocytes Absolute: 0.6 10*3/uL (ref 0.1–1.0)
Monocytes Relative: 8 %
Neutro Abs: 5.2 10*3/uL (ref 1.7–7.7)
Neutrophils Relative %: 79 %
Platelets: 267 10*3/uL (ref 150–400)
RBC: 4.03 MIL/uL — ABNORMAL LOW (ref 4.22–5.81)
RDW: 16.2 % — ABNORMAL HIGH (ref 11.5–15.5)
WBC: 6.5 10*3/uL (ref 4.0–10.5)
nRBC: 0 % (ref 0.0–0.2)

## 2018-11-26 MED ORDER — OXYCODONE-ACETAMINOPHEN 5-325 MG PO TABS
1.0000 | ORAL_TABLET | Freq: Once | ORAL | Status: DC
  Filled 2018-11-26: qty 1

## 2018-11-26 NOTE — ED Provider Notes (Signed)
[facility] EMERGENCY DEPARTMENT Provider Note   CSN: [id number] Arrival date & time: 11/25/18  2115    History   Chief Complaint Chief Complaint  Patient presents with  . Urinary Retention    HPI Jorge Nixon is a 85 y.o. male.     HPI  85 year old male with multiple medical problems including chronic kidney disease, COPD, hypertension, hyperlipidemia, nonischemic cardiomyopathy who presents with urinary retention.  Patient is followed by hospice.  Per hospice nurse, he needs a urinary catheter.  She attempted to place one at home but was unsuccessful.  Patient reports "weeks" of "not peeing right."  He cannot tell me when the last time he urinated was.  He denies any back pain, flank pain, fevers.  He denies any chest pain.  He reports shortness of breath which is at his baseline.  Denies any recent coughs or sick contacts.  Patient denies any pain at this time.  Bedside bladder scan with 200 cc.  Past Medical History:  Diagnosis Date  . Anemia   . Arthritis   . BPH (benign prostatic hyperplasia)   . Carotid disease, bilateral ([place])    a. s/p L CEA in 2014. b. Duplex 05/2016: Right 40-59% ICA stenosis; Left 1-39% ICA stenosis  . CKD (chronic kidney disease), stage III ([place])   . COPD (chronic obstructive pulmonary disease) ([place])   . Diverticular disease 02/14/2011   problems swallowing  . DVT (deep venous thrombosis) ([place])   . Esophageal dysmotility   . Esophageal web   . GERD (gastroesophageal reflux disease)   . Headache(784.0)   . History of blood transfusion   . Hyperlipidemia   . Hypertension   . Hypothyroidism   . Kidney stone   . Nephrolithiasis    lithotripsy 2006  . Non-ischemic cardiomyopathy ([place])    a. Prior EF 45%, normal nuc 2012. b. EF 50-55% in 2018.  [name] PAF (paroxysmal atrial fibrillation) ([place])    a. identified on monitor 06/2016.  [name] Pneumonia    hx of - hospitalized  . Stroke ([place]) 05/2016  . TIA (transient ischemic attack)     Patient Active  Problem List   Diagnosis Date Noted  . Hematemesis 09/13/2017  . Hypothyroidism 09/13/2017  . Acute blood loss anemia 09/13/2017  . Abdominal pain, epigastric 09/13/2017  . Hematochezia   . Constipation   . Left-sided chest pain 05/18/2017  . PAF (paroxysmal atrial fibrillation) ([place]) 05/18/2017  . CKD (chronic kidney disease) stage 3, GFR 30-59 ml/min (HCC) 05/18/2017  . Thoracic aortic aneurysm without rupture ([place]) 05/18/2017  . AAA (abdominal aortic aneurysm) without rupture ([place]) 05/18/2017  . Renal artery stenosis ([place]) 05/18/2017  . Thrombocytopenia ([place]) 05/18/2017  . Carotid stenosis R 06/14/2016  . History of CEA (carotid endarterectomy)   . History of stroke   . Hyperlipidemia   . CVA (cerebral vascular accident) ([place]) - sm R cortical cryptogenic infarct 06/11/2016  . Aftercare following surgery of the circulatory system, [place] 10/11/2013  . CVA (cerebral infarction) 02/23/2013  . Occlusion and stenosis of carotid artery with cerebral infarction 02/23/2013  . TIA (transient ischemic attack) 02/22/2013  . Right arm weakness 02/22/2013  . Hypertension 02/22/2013  . Esophageal dysmotility 02/24/2011  . Esophageal web 02/24/2011  . Mouth pain 02/24/2011  . Acute renal failure ([place]) 02/16/2011  . Hypokalemia 02/13/2011  . Protein-calorie malnutrition, severe ([place]) 02/13/2011  . Dysphagia 02/10/2011  . Anemia 02/10/2011  . Weakness generalized 02/10/2011  . S/P hip replacement right 02/10/2011  Past Surgical History:  Procedure Laterality Date  . CAROTID ENDARTERECTOMY Left 03-11-13   cea  . carpel tunnel Right   . ENDARTERECTOMY Left 03/11/2013   Procedure: ENDARTERECTOMY CAROTID with Vascu-Guard Bovine Patch.;  Surgeon: Conrad Pearson, MD;  Location: [facility];  Service: Vascular;  Laterality: Left;  . EP IMPLANTABLE DEVICE N/A 06/14/2016   Procedure: Loop Recorder Insertion;  Surgeon: Will Meredith Leeds, MD;  Location: [place] [facility];  Service: Cardiovascular;   Laterality: N/A;  . ESOPHAGOGASTRODUODENOSCOPY N/A 09/14/2017   Procedure: ESOPHAGOGASTRODUODENOSCOPY (EGD);  Surgeon: Rogene Houston, MD;  [facility];  Service: Endoscopy;  Laterality: N/A;  . JOINT REPLACEMENT Right    hip  . JOINT REPLACEMENT Left    knee  . KNEE ARTHROSCOPY Right 2010  . LOOP RECORDER REMOVAL N/A 08/23/2016   Procedure: Loop Recorder Removal;  Surgeon: Will Meredith Leeds, MD;  Location: [place] [facility];  Service: Cardiovascular;  Laterality: N/A;  . REPLACEMENT TOTAL KNEE Left 2006   left knee  . TOTAL HIP ARTHROPLASTY Right august 6th 2012   right side   . TRANSURETHRAL RESECTION OF PROSTATE  1996        Home Medications    Prior to Admission medications   Medication Sig Start Date End Date Taking? Authorizing Provider  acetaminophen (TYLENOL) 500 MG tablet Take 500-1,000 mg by mouth daily as needed for headache.     [provider]  aspirin 81 MG chewable tablet Chew 324 mg by mouth once.    [provider]  atorvastatin (LIPITOR) 20 MG tablet TAKE 1 TABLET BY MOUTH ONCE DAILY AT 6 PM. Patient taking differently: Take 20 mg by mouth every evening.  07/09/18   Lendon Colonel, NP  docusate sodium (COLACE) 100 MG capsule Take 100 mg by mouth at bedtime.    [provider]  levothyroxine (SYNTHROID) 100 MCG tablet Take 100 mcg by mouth every morning.  05/29/16   [provider]  losartan (COZAAR) 25 MG tablet Take 25 mg by mouth every morning.     [provider]  Multiple Vitamins-Minerals (CENTRUM ADULTS PO) Take 1 tablet by mouth daily.    [provider]  mupirocin ointment (BACTROBAN) 2 % Apply a small amt of ointment to the affected area TID x 10 days Patient not taking: Reported on 06/27/2018 06/10/18   Triplett, Tammy, PA-C  Nutritional Supplements (ENSURE COMPLETE PO) Take 237 mLs by mouth See admin instructions. Drink one can (237 mls) by mouth every other night    [provider]  pantoprazole (PROTONIX) 20 MG tablet Take 1 tablet (20 mg total) by mouth daily. 06/27/18   Milton Ferguson, MD  Psyllium (METAMUCIL PO) Take 2 scoop by mouth at bedtime. Mix in liquid and drink    [provider]  Sennosides (EQ LAXATIVE) 15 MG CHEW Chew 1 tablet by mouth every evening.    [provider]  silodosin (RAPAFLO) 8 MG CAPS capsule Take 8 mg by mouth every evening. 09/18/18   [provider]  traMADol (ULTRAM) 50 MG tablet Take 50 mg by mouth every 6 (six) hours as needed for moderate pain or severe pain.    [provider]  XARELTO 15 MG TABS tablet TAKE 1 TABLET BY MOUTH DAILY WITH DINNER. Patient taking differently: Take 15 mg by mouth daily with supper.  10/08/18   Satira Sark, MD    Family History Family History  Problem Relation Age of Onset  .  Stroke Father   . Heart attack Father   . Hypertension Mother   . Hypertension Other   . Diabetes Other     Social History Social History   Tobacco Use  . Smoking status: Never Smoker  . Smokeless tobacco: Never Used  Substance Use Topics  . Alcohol use: No    Alcohol/week: 0.0 standard drinks  . Drug use: No     Allergies   Sulfa antibiotics and Milk-related compounds   Review of Systems Review of Systems  Constitutional: Negative for fever.  Respiratory: Positive for shortness of breath. Negative for cough.   Cardiovascular: Negative for chest pain.  Gastrointestinal: Negative for abdominal pain, nausea and vomiting.  Genitourinary: Positive for difficulty urinating. Negative for flank pain.  Musculoskeletal: Negative for back pain.  Neurological: Negative for headaches.  All other systems reviewed and are negative.    Physical Exam Updated Vital Signs BP 127/75 (BP Location: Left Arm)   Pulse 97   Temp (!) 97.5 F (36.4 C) (Oral)   Resp 18   Ht 1.854 m (6\' 1" )   Wt 54.4 kg   SpO2 94%   BMI 15.83 kg/m   Physical Exam Vitals signs and  nursing note reviewed.  Constitutional:      Appearance: He is well-developed.     Comments: Frail, thin  HENT:     Head: Normocephalic and atraumatic.     Mouth/Throat:     Mouth: Mucous membranes are dry.  Neck:     Musculoskeletal: Neck supple.  Cardiovascular:     Rate and Rhythm: Normal rate and regular rhythm.  Pulmonary:     Effort: Pulmonary effort is normal. No respiratory distress.     Breath sounds: Normal breath sounds.  Abdominal:     General: Bowel sounds are normal.     Palpations: Abdomen is soft.     Tenderness: There is no abdominal tenderness. There is no rebound.  Genitourinary:    Comments: Uncircumcised Musculoskeletal:     Right lower leg: No edema.     Left lower leg: No edema.  Skin:    General: Skin is warm and dry.  Neurological:     Mental Status: He is alert and oriented to person, place, and time.  Psychiatric:        Mood and Affect: Mood normal.      ED Treatments / Results  Labs (all labs ordered are listed, but only abnormal results are displayed) Labs Reviewed  CBC WITH DIFFERENTIAL/PLATELET - Abnormal; Notable for the following components:      Result Value   RBC 4.03 (*)    Hemoglobin 11.1 (*)    HCT 36.3 (*)    RDW 16.2 (*)    Lymphs Abs 0.6 (*)    All other components within normal limits  BASIC METABOLIC PANEL - Abnormal; Notable for the following components:   Glucose, Bld 113 (*)    Calcium 8.5 (*)    All other components within normal limits    EKG None  Radiology No results found.  Procedures Procedures (including critical care time)  Foley Catheter placement Placed by: Thayer Jew, MD 89 F COude Catheter Indication: Acute urinary retention No immediate complications  Medications Ordered in ED Medications  sodium chloride 0.9 % bolus 500 mL (has no administration in time range)  oxyCODONE-acetaminophen (PERCOCET/ROXICET) 5-325 MG per tablet 1 tablet (has no administration in time range)      Initial Impression / Assessment and Plan / ED Course  I have reviewed the triage vital signs and the nursing notes.  Pertinent labs & imaging results that were available during my care of the patient were reviewed by me and considered in my medical decision making (see chart for details).        Patient presents with urinary retention.  Appears acute on chronic.  Only 200 cc in the bladder.  Nursing was unable to pass the catheter.  I passed an 42 [place] coud catheter without any immediate issues.  Patient had return of urine although it was minimal.  For this reason I did obtain a CBC and a BMP to rule out acute kidney injury.  Blood work is at baseline.  Will discharge home with urology follow-up and hospice follow-up.  After history, exam, and medical workup I feel the patient has been appropriately medically screened and is safe for discharge home. Pertinent diagnoses were discussed with the patient. Patient was given return precautions.   Final Clinical Impressions(s) / ED Diagnoses   Final diagnoses:  Urinary retention    ED Discharge Orders    None       Lovel Suazo, Barbette Hair, MD 11/26/18 [id number]

## 2018-11-26 NOTE — Discharge Instructions (Addendum)
You were seen today for urinary retention.  A Foley catheter was placed.  Make sure that you are staying hydrated.  Follow-up with urology and your hospice nurse.

## 2018-12-29 DEATH — deceased

## 2019-01-04 ENCOUNTER — Ambulatory Visit: Payer: 59 | Admitting: Cardiovascular Disease

## 2020-02-05 IMAGING — CT CT HEAD W/O CM
3 series · 15 of 47 positions shown, 18 images · non-contrast
Comparison: Head CT without contrast and brain MRI 06/12/2016, and
earlier.

CLINICAL DATA: [AGE] male with headache. Right upper
extremity numbness.

EXAM:
CT HEAD WITHOUT CONTRAST
TECHNIQUE: Contiguous axial images were obtained from the base of the skull
through the vertex without intravenous contrast.

[Series 2: head wo · axial · 0.43mm/px · z∈[+49,+179]mm · 9 of 32 slices shown, 12 images]
[im 3/32  brain]
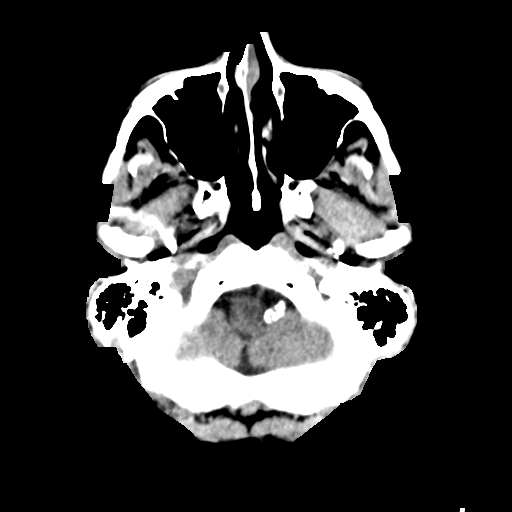
[im 3/32  bone]
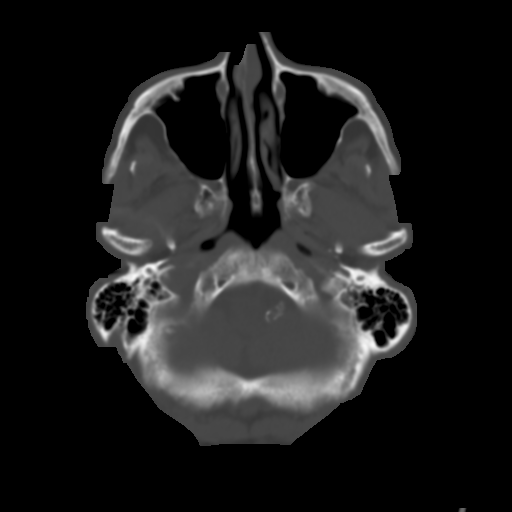
[im 6/32  brain]
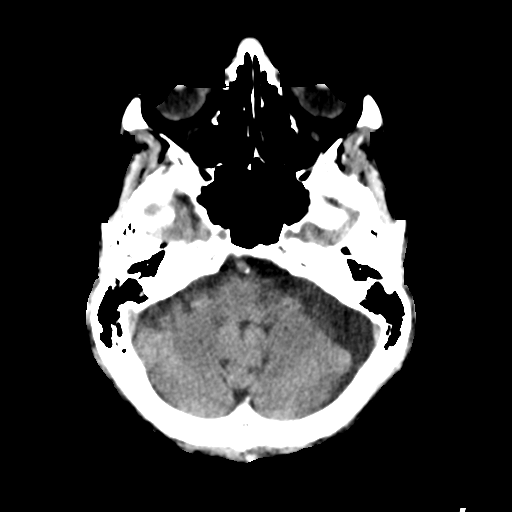
[im 9/32  brain]
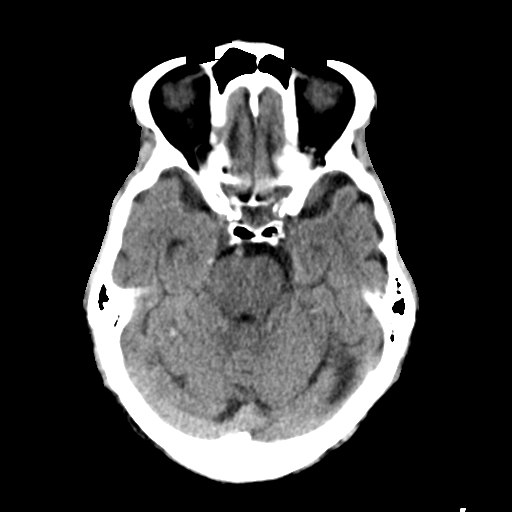
[im 12/32  brain]
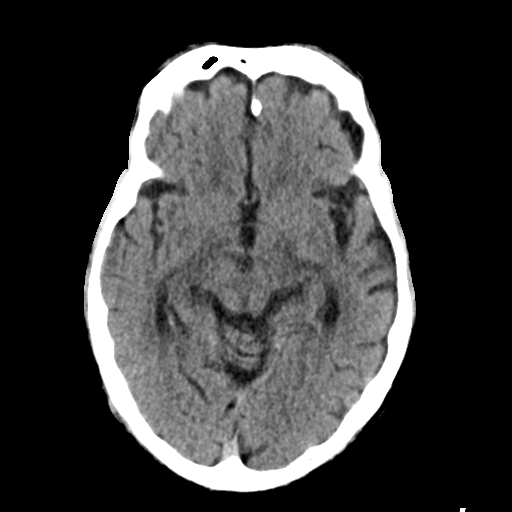
[im 17/32  brain]
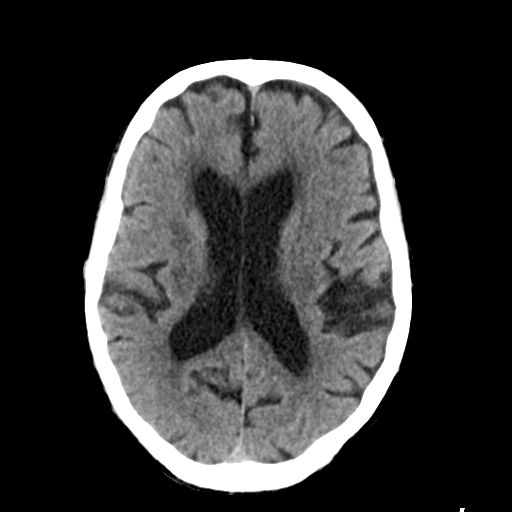
[im 17/32  bone]
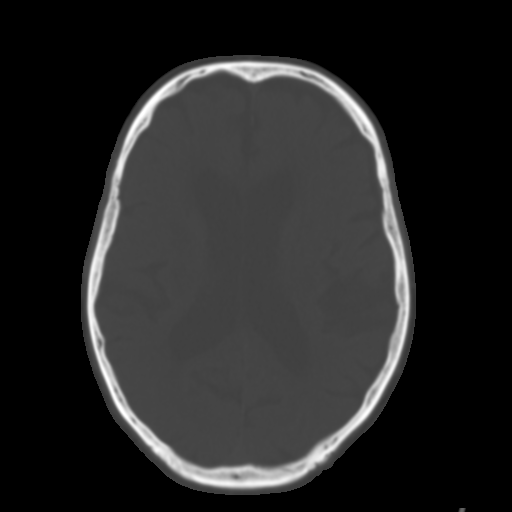
[im 20/32  brain]
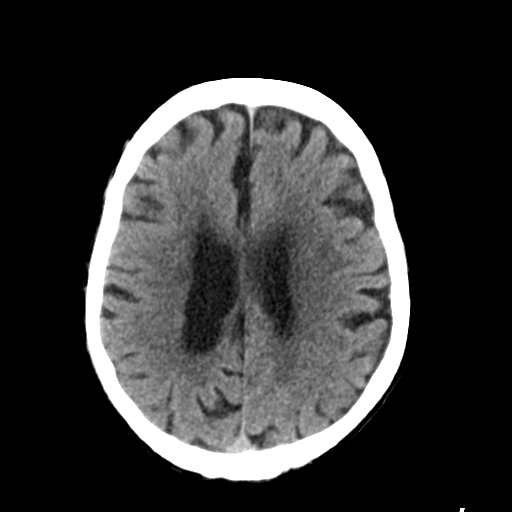
[im 23/32  brain]
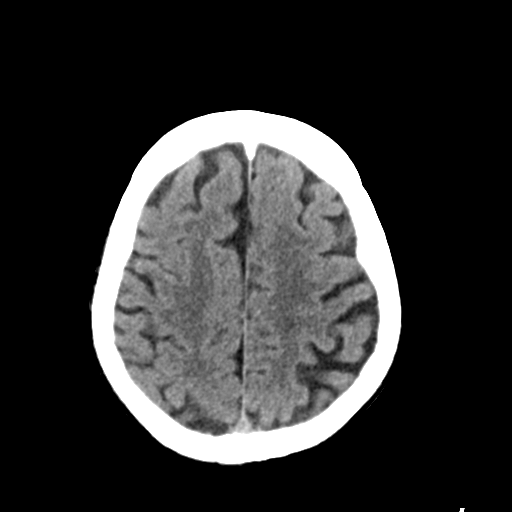
[im 26/32  brain]
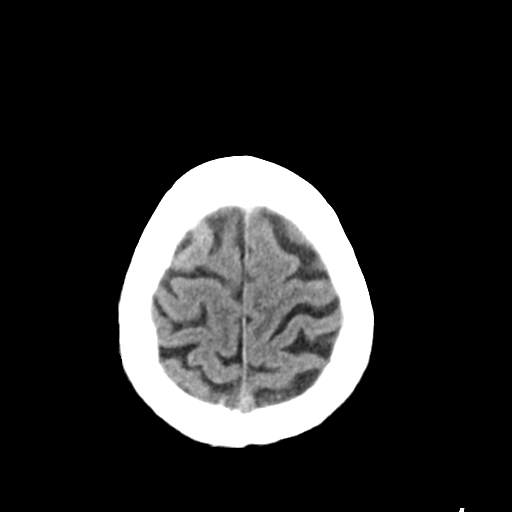
[im 29/32  brain]
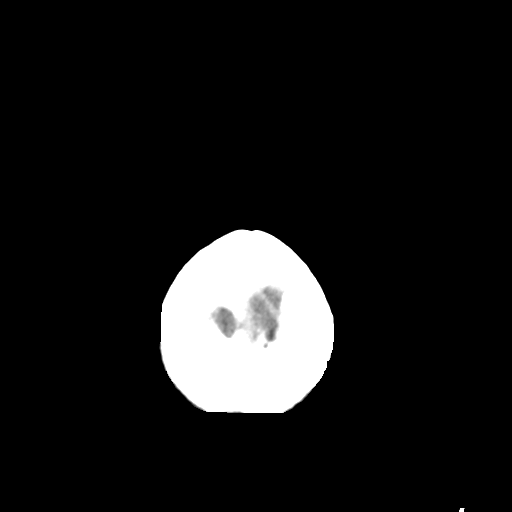
[im 29/32  bone]
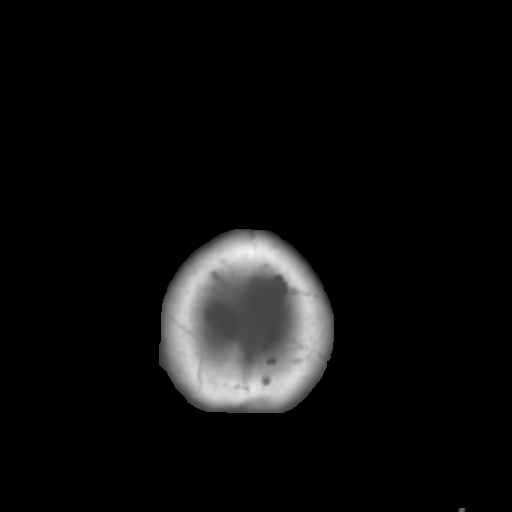

[Series 4: coronal soft tissue · coronal · 0.30mm/px · 3 of 69 slices shown]
[im 23/69  brain]
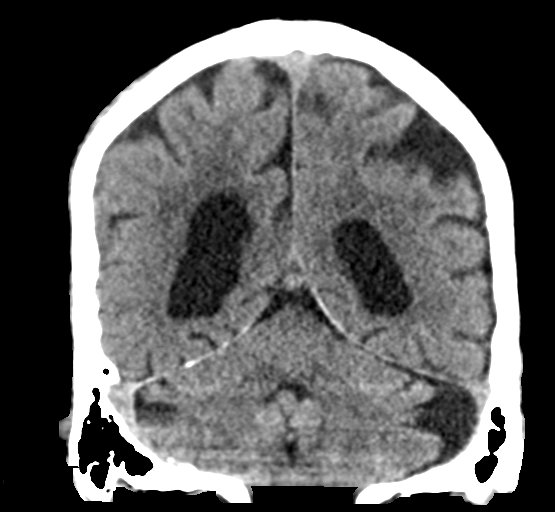
[im 31/69  brain]
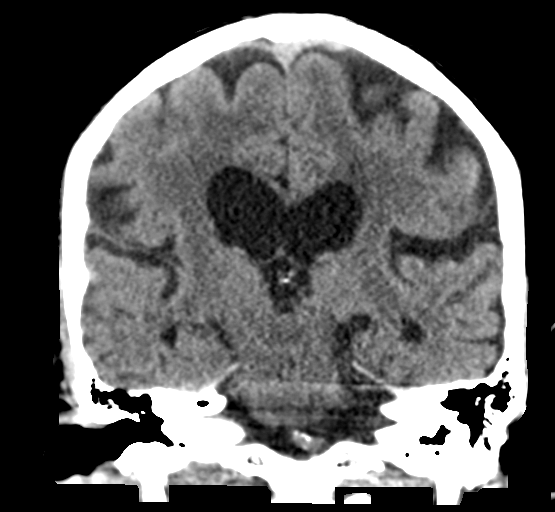
[im 38/69  brain]
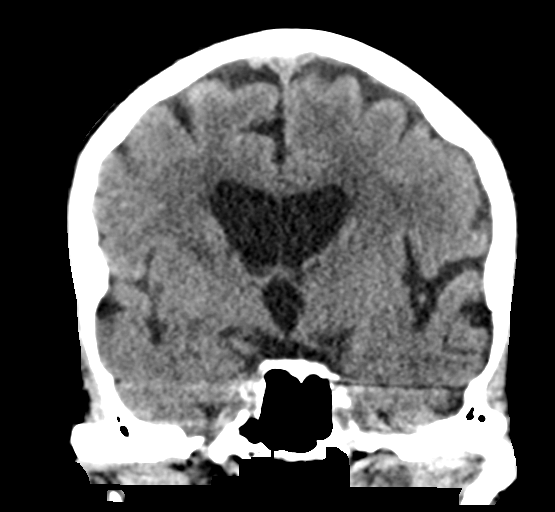

[Series 5: sagittal soft tissue · sagittal · 0.30mm/px · 3 of 54 slices shown]
[im 18/54  brain]
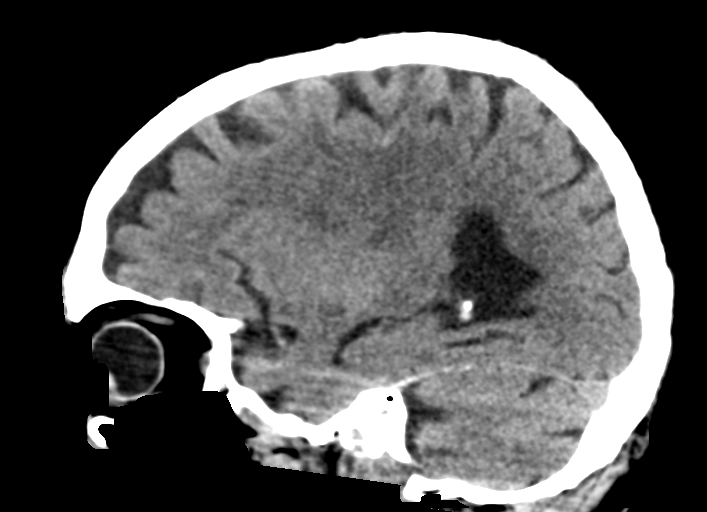
[im 27/54  brain]
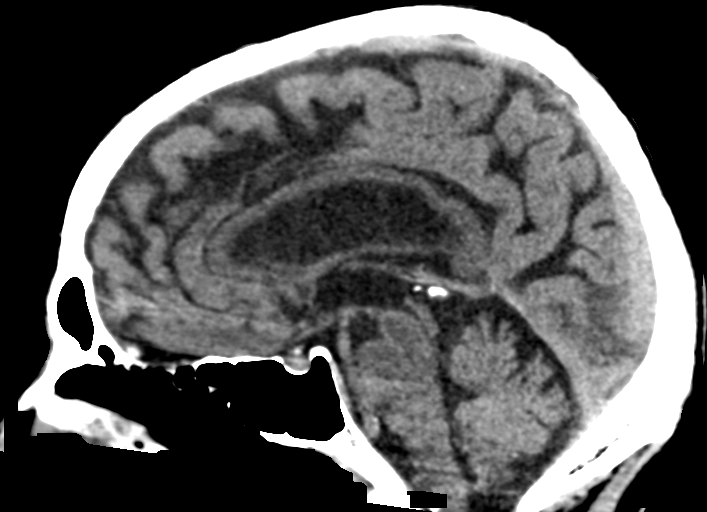
[im 36/54  brain]
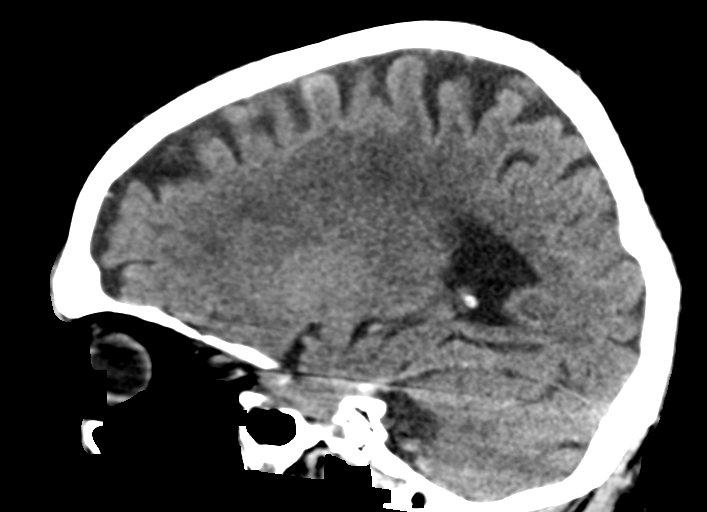

[15 of 47 positions shown; findings below may reference images not displayed]

FINDINGS: Brain: The small right peri rolandic cortical infarct seen in
[REDACTED] demonstrates subtle encephalomalacia (series 2, image 24).
Gray-white matter differentiation elsewhere in the hemispheres
appears stable and normal for age. A small chronic left cerebellar
lacune was better demonstrated by MRI.

No midline shift, ventriculomegaly, mass effect, evidence of mass
lesion, intracranial hemorrhage or evidence of cortically based
acute infarction.

Vascular: Calcified atherosclerosis at the skull base. No suspicious
intracranial vascular hyperdensity.

Skull: No acute osseous abnormality identified.

Sinuses/Orbits: Clear.

Other: Visualized orbit soft tissues are within normal limits. Scalp
soft tissues appear within normal limits today.
IMPRESSION: 1. No acute intracranial abnormality.
2. Expected evolution of the small right peri rolandic cortical
infarct in [REDACTED], with subtle encephalomalacia by CT.
